# Patient Record
Sex: Female | Born: 1955
Health system: Southern US, Community
[De-identification: ages and names within clinical notes are randomized; demographics above are authoritative.]

## PROBLEM LIST (undated history)

## (undated) DIAGNOSIS — M1990 Unspecified osteoarthritis, unspecified site: Secondary | ICD-10-CM

## (undated) DIAGNOSIS — Z9221 Personal history of antineoplastic chemotherapy: Secondary | ICD-10-CM

## (undated) DIAGNOSIS — Z9889 Other specified postprocedural states: Secondary | ICD-10-CM

## (undated) DIAGNOSIS — Z853 Personal history of malignant neoplasm of breast: Secondary | ICD-10-CM

## (undated) DIAGNOSIS — D649 Anemia, unspecified: Secondary | ICD-10-CM

## (undated) DIAGNOSIS — F419 Anxiety disorder, unspecified: Secondary | ICD-10-CM

## (undated) DIAGNOSIS — M5020 Other cervical disc displacement, unspecified cervical region: Secondary | ICD-10-CM

## (undated) DIAGNOSIS — R002 Palpitations: Secondary | ICD-10-CM

## (undated) DIAGNOSIS — F329 Major depressive disorder, single episode, unspecified: Secondary | ICD-10-CM

## (undated) DIAGNOSIS — R112 Nausea with vomiting, unspecified: Secondary | ICD-10-CM

## (undated) DIAGNOSIS — C50919 Malignant neoplasm of unspecified site of unspecified female breast: Secondary | ICD-10-CM

## (undated) DIAGNOSIS — F32A Depression, unspecified: Secondary | ICD-10-CM

## (undated) DIAGNOSIS — K219 Gastro-esophageal reflux disease without esophagitis: Secondary | ICD-10-CM

## (undated) DIAGNOSIS — M797 Fibromyalgia: Secondary | ICD-10-CM

## (undated) DIAGNOSIS — Z98811 Dental restoration status: Secondary | ICD-10-CM

## (undated) DIAGNOSIS — I10 Essential (primary) hypertension: Secondary | ICD-10-CM

## (undated) DIAGNOSIS — M5030 Other cervical disc degeneration, unspecified cervical region: Secondary | ICD-10-CM

## (undated) DIAGNOSIS — Z800 Family history of malignant neoplasm of digestive organs: Secondary | ICD-10-CM

## (undated) DIAGNOSIS — E042 Nontoxic multinodular goiter: Secondary | ICD-10-CM

## (undated) DIAGNOSIS — Z803 Family history of malignant neoplasm of breast: Secondary | ICD-10-CM

## (undated) HISTORY — DX: Family history of malignant neoplasm of breast: Z80.3

## (undated) HISTORY — DX: Nontoxic multinodular goiter: E04.2

## (undated) HISTORY — DX: Essential (primary) hypertension: I10

## (undated) HISTORY — DX: Family history of malignant neoplasm of digestive organs: Z80.0

## (undated) HISTORY — DX: Fibromyalgia: M79.7

## (undated) HISTORY — DX: Anxiety disorder, unspecified: F41.9

## (undated) HISTORY — DX: Malignant neoplasm of unspecified site of unspecified female breast: C50.919

## (undated) HISTORY — DX: Palpitations: R00.2

## (undated) HISTORY — PX: BREAST BIOPSY: SHX20

---

## 2000-05-26 ENCOUNTER — Encounter: Admission: RE | Admit: 2000-05-26 | Discharge: 2000-08-24 | Payer: Self-pay | Admitting: Radiation Oncology

## 2000-11-15 ENCOUNTER — Encounter (HOSPITAL_COMMUNITY): Admission: RE | Admit: 2000-11-15 | Discharge: 2000-12-15 | Payer: Self-pay | Admitting: Oncology

## 2000-11-15 ENCOUNTER — Encounter: Admission: RE | Admit: 2000-11-15 | Discharge: 2000-11-15 | Payer: Self-pay | Admitting: Oncology

## 2000-11-17 ENCOUNTER — Encounter (HOSPITAL_COMMUNITY): Payer: Self-pay | Admitting: Oncology

## 2000-11-23 ENCOUNTER — Encounter (HOSPITAL_COMMUNITY): Payer: Self-pay | Admitting: Oncology

## 2000-11-23 ENCOUNTER — Encounter: Admission: RE | Admit: 2000-11-23 | Discharge: 2000-11-23 | Payer: Self-pay | Admitting: Oncology

## 2001-02-07 ENCOUNTER — Encounter (HOSPITAL_COMMUNITY): Admission: RE | Admit: 2001-02-07 | Discharge: 2001-03-09 | Payer: Self-pay | Admitting: Oncology

## 2001-02-07 ENCOUNTER — Encounter: Admission: RE | Admit: 2001-02-07 | Discharge: 2001-02-07 | Payer: Self-pay | Admitting: Oncology

## 2001-08-08 ENCOUNTER — Encounter (HOSPITAL_COMMUNITY): Admission: RE | Admit: 2001-08-08 | Discharge: 2001-09-07 | Payer: Self-pay | Admitting: Family Medicine

## 2001-08-08 ENCOUNTER — Encounter: Admission: RE | Admit: 2001-08-08 | Discharge: 2001-08-08 | Payer: Self-pay | Admitting: Family Medicine

## 2001-12-05 ENCOUNTER — Encounter (HOSPITAL_COMMUNITY): Payer: Self-pay | Admitting: Oncology

## 2001-12-05 ENCOUNTER — Encounter (HOSPITAL_COMMUNITY): Admission: RE | Admit: 2001-12-05 | Discharge: 2002-01-04 | Payer: Self-pay | Admitting: Oncology

## 2002-01-27 ENCOUNTER — Encounter: Payer: Self-pay | Admitting: Family Medicine

## 2002-01-27 ENCOUNTER — Ambulatory Visit (HOSPITAL_COMMUNITY): Admission: RE | Admit: 2002-01-27 | Discharge: 2002-01-27 | Payer: Self-pay | Admitting: Family Medicine

## 2002-03-06 ENCOUNTER — Encounter (HOSPITAL_COMMUNITY): Admission: RE | Admit: 2002-03-06 | Discharge: 2002-04-05 | Payer: Self-pay | Admitting: Oncology

## 2002-03-06 ENCOUNTER — Encounter: Admission: RE | Admit: 2002-03-06 | Discharge: 2002-03-06 | Payer: Self-pay | Admitting: Oncology

## 2002-05-01 ENCOUNTER — Encounter: Admission: RE | Admit: 2002-05-01 | Discharge: 2002-05-01 | Payer: Self-pay | Admitting: Oncology

## 2002-05-10 ENCOUNTER — Encounter (HOSPITAL_COMMUNITY): Payer: Self-pay | Admitting: Oncology

## 2002-05-10 ENCOUNTER — Encounter: Admission: RE | Admit: 2002-05-10 | Discharge: 2002-05-10 | Payer: Self-pay | Admitting: Oncology

## 2002-06-30 ENCOUNTER — Ambulatory Visit (HOSPITAL_COMMUNITY): Admission: RE | Admit: 2002-06-30 | Discharge: 2002-06-30 | Payer: Self-pay | Admitting: Internal Medicine

## 2002-06-30 ENCOUNTER — Encounter: Payer: Self-pay | Admitting: Internal Medicine

## 2002-09-04 ENCOUNTER — Encounter: Admission: RE | Admit: 2002-09-04 | Discharge: 2002-09-04 | Payer: Self-pay | Admitting: Oncology

## 2002-09-04 ENCOUNTER — Encounter (HOSPITAL_COMMUNITY): Admission: RE | Admit: 2002-09-04 | Discharge: 2002-10-04 | Payer: Self-pay | Admitting: Oncology

## 2002-12-11 ENCOUNTER — Encounter (HOSPITAL_COMMUNITY): Payer: Self-pay | Admitting: Oncology

## 2002-12-11 ENCOUNTER — Encounter: Admission: RE | Admit: 2002-12-11 | Discharge: 2002-12-11 | Payer: Self-pay | Admitting: Oncology

## 2003-03-07 ENCOUNTER — Encounter (HOSPITAL_COMMUNITY): Admission: RE | Admit: 2003-03-07 | Discharge: 2003-04-06 | Payer: Self-pay | Admitting: Oncology

## 2003-03-07 ENCOUNTER — Encounter: Admission: RE | Admit: 2003-03-07 | Discharge: 2003-03-07 | Payer: Self-pay | Admitting: Oncology

## 2003-08-31 ENCOUNTER — Encounter (HOSPITAL_COMMUNITY): Admission: RE | Admit: 2003-08-31 | Discharge: 2003-09-30 | Payer: Self-pay | Admitting: Oncology

## 2003-08-31 ENCOUNTER — Encounter: Admission: RE | Admit: 2003-08-31 | Discharge: 2003-08-31 | Payer: Self-pay | Admitting: Oncology

## 2004-01-22 ENCOUNTER — Encounter: Admission: RE | Admit: 2004-01-22 | Discharge: 2004-01-22 | Payer: Self-pay | Admitting: Oncology

## 2004-05-13 ENCOUNTER — Ambulatory Visit (HOSPITAL_COMMUNITY): Payer: Self-pay | Admitting: Oncology

## 2004-05-13 ENCOUNTER — Encounter: Admission: RE | Admit: 2004-05-13 | Discharge: 2004-05-13 | Payer: Self-pay | Admitting: Oncology

## 2004-05-13 ENCOUNTER — Encounter (HOSPITAL_COMMUNITY): Admission: RE | Admit: 2004-05-13 | Discharge: 2004-06-12 | Payer: Self-pay | Admitting: Oncology

## 2005-03-05 ENCOUNTER — Encounter: Admission: RE | Admit: 2005-03-05 | Discharge: 2005-03-05 | Payer: Self-pay | Admitting: Oncology

## 2005-05-15 ENCOUNTER — Ambulatory Visit (HOSPITAL_COMMUNITY): Payer: Self-pay | Admitting: Oncology

## 2005-05-15 ENCOUNTER — Encounter: Admission: RE | Admit: 2005-05-15 | Discharge: 2005-05-15 | Payer: Self-pay | Admitting: Oncology

## 2005-05-15 ENCOUNTER — Encounter (HOSPITAL_COMMUNITY): Admission: RE | Admit: 2005-05-15 | Discharge: 2005-06-14 | Payer: Self-pay | Admitting: Oncology

## 2005-07-14 ENCOUNTER — Ambulatory Visit (HOSPITAL_COMMUNITY): Admission: RE | Admit: 2005-07-14 | Discharge: 2005-07-14 | Payer: Self-pay | Admitting: Family Medicine

## 2006-03-12 ENCOUNTER — Encounter: Admission: RE | Admit: 2006-03-12 | Discharge: 2006-03-12 | Payer: Self-pay | Admitting: Family Medicine

## 2006-05-25 ENCOUNTER — Ambulatory Visit (HOSPITAL_COMMUNITY): Payer: Self-pay | Admitting: Oncology

## 2006-05-25 ENCOUNTER — Encounter (HOSPITAL_COMMUNITY): Admission: RE | Admit: 2006-05-25 | Discharge: 2006-06-24 | Payer: Self-pay | Admitting: Oncology

## 2007-05-04 ENCOUNTER — Encounter: Admission: RE | Admit: 2007-05-04 | Discharge: 2007-05-04 | Payer: Self-pay | Admitting: Obstetrics and Gynecology

## 2007-06-20 ENCOUNTER — Encounter (HOSPITAL_COMMUNITY): Admission: RE | Admit: 2007-06-20 | Discharge: 2007-07-20 | Payer: Self-pay | Admitting: Oncology

## 2007-06-20 ENCOUNTER — Ambulatory Visit (HOSPITAL_COMMUNITY): Payer: Self-pay | Admitting: Oncology

## 2007-07-29 ENCOUNTER — Ambulatory Visit (HOSPITAL_COMMUNITY): Admission: RE | Admit: 2007-07-29 | Discharge: 2007-07-29 | Payer: Self-pay | Admitting: Family Medicine

## 2007-08-08 ENCOUNTER — Encounter (HOSPITAL_COMMUNITY): Admission: RE | Admit: 2007-08-08 | Discharge: 2007-09-07 | Payer: Self-pay | Admitting: Family Medicine

## 2007-09-27 ENCOUNTER — Ambulatory Visit (HOSPITAL_COMMUNITY): Admission: RE | Admit: 2007-09-27 | Discharge: 2007-09-27 | Payer: Self-pay | Admitting: Family Medicine

## 2008-05-15 ENCOUNTER — Ambulatory Visit (HOSPITAL_COMMUNITY): Admission: RE | Admit: 2008-05-15 | Discharge: 2008-05-15 | Payer: Self-pay | Admitting: Endocrinology

## 2008-05-31 ENCOUNTER — Encounter: Admission: RE | Admit: 2008-05-31 | Discharge: 2008-05-31 | Payer: Self-pay | Admitting: Family Medicine

## 2008-06-18 ENCOUNTER — Ambulatory Visit (HOSPITAL_COMMUNITY): Payer: Self-pay | Admitting: Oncology

## 2008-11-07 ENCOUNTER — Ambulatory Visit (HOSPITAL_COMMUNITY): Admission: RE | Admit: 2008-11-07 | Discharge: 2008-11-07 | Payer: Self-pay | Admitting: Family Medicine

## 2008-12-14 ENCOUNTER — Ambulatory Visit (HOSPITAL_COMMUNITY): Admission: RE | Admit: 2008-12-14 | Discharge: 2008-12-14 | Payer: Self-pay | Admitting: Endocrinology

## 2008-12-19 ENCOUNTER — Ambulatory Visit (HOSPITAL_COMMUNITY): Admission: RE | Admit: 2008-12-19 | Discharge: 2008-12-19 | Payer: Self-pay | Admitting: Family Medicine

## 2009-06-11 ENCOUNTER — Encounter: Payer: Self-pay | Admitting: Orthopedic Surgery

## 2009-06-11 ENCOUNTER — Ambulatory Visit (HOSPITAL_COMMUNITY): Admission: RE | Admit: 2009-06-11 | Discharge: 2009-06-11 | Payer: Self-pay | Admitting: Family Medicine

## 2009-06-17 ENCOUNTER — Ambulatory Visit (HOSPITAL_COMMUNITY): Payer: Self-pay | Admitting: Oncology

## 2009-06-17 ENCOUNTER — Encounter (HOSPITAL_COMMUNITY): Admission: RE | Admit: 2009-06-17 | Discharge: 2009-07-17 | Payer: Self-pay | Admitting: Oncology

## 2009-06-19 ENCOUNTER — Ambulatory Visit (HOSPITAL_COMMUNITY): Admission: RE | Admit: 2009-06-19 | Discharge: 2009-06-19 | Payer: Self-pay | Admitting: Family Medicine

## 2009-06-21 ENCOUNTER — Ambulatory Visit (HOSPITAL_COMMUNITY): Admission: RE | Admit: 2009-06-21 | Discharge: 2009-06-21 | Payer: Self-pay | Admitting: Family Medicine

## 2009-06-24 ENCOUNTER — Encounter: Admission: RE | Admit: 2009-06-24 | Discharge: 2009-06-24 | Payer: Self-pay | Admitting: Family Medicine

## 2009-06-25 ENCOUNTER — Other Ambulatory Visit: Admission: RE | Admit: 2009-06-25 | Discharge: 2009-06-25 | Payer: Self-pay | Admitting: General Surgery

## 2009-07-03 ENCOUNTER — Ambulatory Visit: Payer: Self-pay | Admitting: Orthopedic Surgery

## 2009-07-03 DIAGNOSIS — M25819 Other specified joint disorders, unspecified shoulder: Secondary | ICD-10-CM | POA: Insufficient documentation

## 2009-07-03 DIAGNOSIS — M7580 Other shoulder lesions, unspecified shoulder: Secondary | ICD-10-CM

## 2009-07-25 ENCOUNTER — Ambulatory Visit: Payer: Self-pay | Admitting: Orthopedic Surgery

## 2009-07-31 ENCOUNTER — Telehealth: Payer: Self-pay | Admitting: Orthopedic Surgery

## 2009-08-02 ENCOUNTER — Ambulatory Visit (HOSPITAL_COMMUNITY): Admission: RE | Admit: 2009-08-02 | Discharge: 2009-08-02 | Payer: Self-pay | Admitting: Endocrinology

## 2009-08-03 ENCOUNTER — Encounter: Payer: Self-pay | Admitting: Orthopedic Surgery

## 2009-08-03 ENCOUNTER — Encounter: Admission: RE | Admit: 2009-08-03 | Discharge: 2009-08-03 | Payer: Self-pay | Admitting: Orthopedic Surgery

## 2009-08-08 ENCOUNTER — Ambulatory Visit: Payer: Self-pay | Admitting: Orthopedic Surgery

## 2009-08-12 ENCOUNTER — Encounter (HOSPITAL_COMMUNITY): Admission: RE | Admit: 2009-08-12 | Discharge: 2009-09-11 | Payer: Self-pay | Admitting: Orthopedic Surgery

## 2009-08-29 ENCOUNTER — Encounter: Payer: Self-pay | Admitting: Orthopedic Surgery

## 2010-02-04 ENCOUNTER — Ambulatory Visit (HOSPITAL_COMMUNITY): Admission: RE | Admit: 2010-02-04 | Discharge: 2010-02-04 | Payer: Self-pay | Admitting: Family Medicine

## 2010-05-25 ENCOUNTER — Encounter: Payer: Self-pay | Admitting: Family Medicine

## 2010-05-25 ENCOUNTER — Encounter (HOSPITAL_COMMUNITY): Payer: Self-pay | Admitting: Oncology

## 2010-05-26 ENCOUNTER — Encounter: Payer: Self-pay | Admitting: Family Medicine

## 2010-06-03 NOTE — Assessment & Plan Note (Signed)
Summary: LEFT SHOULDER PAIN/[facility]/BSF   Visit Type:  Follow-up Referring Provider:  Dr. Nobie Putnam. Primary Provider:  Dr. Nobie Putnam  CC:  left shoulder pain.  History of Present Illness: 55 year old female had an injection 3 weeks ago presents earlier than her 6 week appointment complaining of increased shoulder pain decreased range of motion and radiation of shoulder pain into the lower part of the upper arm.  No recent trauma.  History of cervical disc disease.  Exam shows that she indeed has increased tenderness around the shoulder some warmth some tenderness in the lateral and anterior deltoid, pain with range of motion and external rotation as well as abduction in both motions were limited with only 45 external rotation and about 40 of abduction  She is advised to take the nabumetone and the Ultram I also put her on a prednisone 5 mg 12 a Dosepak, I placed her in a sling and advised to get an MRI of the LEFT shoulder  Reason for delayed reaction to the injection not clear.    Allergies: No Known Drug Allergies   Impression & Recommendations:  Problem # 1:  IMPINGEMENT SYNDROME (ICD-726.2) Assessment Deteriorated  Orders: Est. Patient Level II ([address])  Medications Added to Medication List This Visit: 1)  Prednisone (pak) 5 Mg Tabs (Prednisone) .... As directed 2)  Ultracet 37.5-325 Mg Tabs (Tramadol-acetaminophen) .Marland Kitchen.. 1 by mouth q 4 as needed pain  Patient Instructions: 1)  MRI LEFT SHOULDER   2)  SLING 3)  TAKE PREDNISONE DOSE PACK to decresae pain  4)   2 week return  Prescriptions: ULTRACET 37.5-325 MG TABS (TRAMADOL-ACETAMINOPHEN) 1 by mouth q 4 as needed pain  #60 x 1   Entered and Authorized by:   Fuller Canada MD   Signed by:   Fuller Canada MD on 07/25/2009   Method used:   Print then Give to Patient   RxID:   [id number] PREDNISONE (PAK) 5 MG TABS (PREDNISONE) as directed  #1 x 1   Entered and Authorized by:   Fuller Canada MD   Signed by:    Fuller Canada MD on 07/25/2009   Method used:   Print then Give to Patient   RxID:   [id number]

## 2010-06-03 NOTE — Assessment & Plan Note (Signed)
Summary: LEFT SHOULDER PAIN XR AT [facility] /[facility]F   Vital Signs:  Patient profile:   55 year old female Weight:      178 pounds Pulse rate:   86 / minute Resp:     18 per minute  Vitals Entered By: Fuller Canada MD (July 03, 2009 9:52 AM)  Visit Type:  Initial Consult Referring Provider:  Dr. Nobie Putnam. Primary Provider:  Dr. Nobie Putnam  CC:  left shoulder pain.  History of Present Illness: 55 year old female presents with 2 years of LEFT shoulder pain which is worse when she tries to move her arm behind her back or overhead.  She complains of moderate 5/10 sharp constant pain with a catching sensation in the LEFT shoulder she denies any injury but does report a loss or decrease in range of motion  Xrays [facility] left shoulder and c-spine on 06/11/09. these x-rays have been read and reviewed there are no acute bony abnormalities there may be some calcific tendinitis in the rotator cuff on the LEFT shoulder the cervical spine showed slight displaced narrowing at cervical levels 3 and 4 but otherwise normal films  Meds: Xanax, Prilosec, Metoprolol.  Naprosyn and Vicodin made her sick.      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 Cardiovascular:  Denies chest pain, palpitations, fainting, and murmurs; fast heart beat and hard beats. Respiratory:  Denies short of breath, wheezing, couch, tightness, pain on inspiration, and snoring . Gastrointestinal:  Complains of heartburn; denies nausea, vomiting, diarrhea, constipation, and blood in your stools. Genitourinary:   Complains of bleeding in urine; denies frequency, urgency, difficulty urinating, painful urination, and flank pain. Neurologic:  Complains of dizziness; denies numbness, tingling, unsteady gait, tremors, and seizure. Musculoskeletal:  Complains of stiffness; denies joint pain, swelling, instability, redness, heat, and muscle pain. Endocrine:  Denies excessive thirst, exessive urination, and heat or cold intolerance. Psychiatric:  Complains of nervousness and anxiety; denies depression and hallucinations. Skin:  Denies changes in the skin, poor healing, rash, itching, and redness. HEENT:  Complains of blurred or double vision; denies eye pain, redness, and watering; headache and earaches. Immunology:  Complains of seasonal allergies; denies sinus problems and allergic to bee stings; adverse rxn to foods. Hemoatologic:  Complains of brusing.  Physical Exam  Additional Exam:   VS reviewed and were normal  GEN: appearance was normal   CDV: normal pulses temperature and no edema  LYMPH nodes were normal   SKIN was normal   Neuro: normal sensation Psyche: AAO x 3 and mood was normal   MSK *Gait was normal   The shoulders were evaluated together in terms of palpable tenderness there was mild discomfort on the LEFT anterolateral deltoid none on the RIGHT  In terms of range of motion the RIGHT was full and on the LEFT we saw decreased range of motion in internal rotation approximately 2 levels forward elevation of about 150 vs. 180 and external rotation of about 35 vs. 50  I detected no joint laxity  The rotator cuff strength was normal bilaterally grade 5  Impingement was positive on the LEFT at 150  cervical spine motion  was normal and there was no tenderness   Impression & Recommendations:  Problem # 1:  IMPINGEMENT SYNDROME (ICD-726.2) Assessment New  we injected the LEFT subacromial space Verbal consent obtained/The shoulder was injected with depomedrol 40mg /cc and  sensorcaine .25% . There were no complications  We would like to start an exercise program with Codman exercises to improve range of motion.  Patient education has also been initiated with the shoulder pain handout and impingement syndrome handout from the [employer]  Orders: New Patient Level III ([address]) Joint Aspirate / Injection, Large (20610) Depo- Medrol 40mg  (J1030)  Patient Instructions: 1)  You have received an injection of cortisone today. You may experience increased pain at the injection site. Apply ice pack to the area for 20 minutes every 2 hours and take 2 xtra strength tylenol every 8 hours. This increased pain will usually resolve in 24 hours. The injection will take effect in 3-10 days.  2)  Limit activity to comfort and avoid activities that increase discomfort.  Apply moist heat and/or ice to shoulder and take medication as instructed for pain relief. Please read the Shoulder Pain Handout and start Home exercise program  as directed. 3)  If you do not improve after 6 weeks reschedule an appointment

## 2010-06-03 NOTE — Assessment & Plan Note (Signed)
Summary: 2 WK RECK LEFT SHOULDER/MRI RESULTS/[facility]/BSF   Visit Type:  Follow-up Referring Provider:  Dr. Nobie Putnam. Primary Provider:  Dr. Nobie Putnam  CC:  left shoulder pain.  History of Present Illness: I saw [name] in the office today for a followup visit.  She is a 55 years old woman with the complaint of:  left shoulder.  Medications:  Prednisone (pak) 5 Mg Tabs (Prednisone) She finished this medicine. Ultracet 37.5-325 Mg Tabs (Tramadol-acetaminophen) .Marland Kitchen.. 1 by mouth q 4 as needed pain   MRI results:  IMPRESSION:   1.  5 mm x 13 mm x 8 mm calcified object in the subacromial/subdeltoid bursa.  Minimal fluid is present in the bursa nearby.  The appearance is most consistent with calcific bursitis or small loose body. 2.  Rotator cuff tendinopathy without tear.   Read By:  Wynn Banker      MRI as stated above  I do recommend she did some exercises done at home after her therapy visit to strengthen the shoulder he basically has tendinitis no tear.  I think therapy for 6-8 weeks at home is in order she doesn't improve we can always readdress the situation  Allergies: No Known Drug Allergies  Review of Systems Musculoskeletal:  no catching or locking at this time in the LEFT shoulder.   Other Orders: Physical Therapy Referral (PT) Est. Patient Level III ([address])  Patient Instructions: 1)  PT, one session to get exercises for home exercises RC Syndrome left shoulder 2)  come back as needed

## 2010-06-03 NOTE — Letter (Signed)
Summary: Out of Work  [facility]  [address]  [place], [place] [address]   Phone: [phone]  Fax: [phone]    August 08, 2009   Employee:  SOPHI CALLIGAN    To Whom It May Concern:   For Medical reasons, please excuse the above named employee from work for the following dates:  Start:   07/25/09  End:   08/08/09  Patient is cleared to return to full duty work, no restrictions today, following appointment on 08/08/09.   If you need additional information, please feel free to contact our office.         Sincerely,    Terrance Mass, MD

## 2010-06-03 NOTE — Letter (Signed)
Summary: Out of Work  [facility]  [address]  [place], [place] [address]   Phone: [phone]  Fax: [phone]    July 25, 2009   Employee:  [name]    To Whom It May Concern:   For Medical reasons, please excuse the above named employee from work for the following dates:  Start:   3/ 24/ 2011  End:   4/ 7/ 2011   If you need additional information, please feel free to contact our office.         Sincerely,    Fuller Canada MD

## 2010-06-03 NOTE — Progress Notes (Signed)
Summary: MRI appointment.  Phone Note Outgoing Call   Call placed by: Waldon Reining,  July 31, 2009 5:03 PM Call placed to: Patient Action Taken: Phone Call Completed, Appt scheduled Summary of Call: I called to give the patient her MRI appointment at [facility] on 08-03-09 at 3:30. Patient has [facility], authorization [phone]. Patient is aware to bring her films for her follow up appointment.

## 2010-06-03 NOTE — Miscellaneous (Signed)
Summary: OT Progress note  OT Progress note   Imported By: Jacklynn Ganong 09/09/2009 09:58:38  _____________________________________________________________________  External Attachment:    Type:   Image     Comment:   External Document

## 2010-06-03 NOTE — Letter (Signed)
Summary: History form  History form   Imported By: Jacklynn Ganong 07/08/2009 10:36:52  _____________________________________________________________________  External Attachment:    Type:   Image     Comment:   External Document

## 2010-06-06 ENCOUNTER — Other Ambulatory Visit: Payer: Self-pay | Admitting: Obstetrics and Gynecology

## 2010-06-06 DIAGNOSIS — Z1239 Encounter for other screening for malignant neoplasm of breast: Secondary | ICD-10-CM

## 2010-06-17 ENCOUNTER — Ambulatory Visit (HOSPITAL_COMMUNITY): Payer: 59 | Admitting: Oncology

## 2010-06-17 ENCOUNTER — Encounter (HOSPITAL_COMMUNITY): Payer: 59 | Attending: Oncology

## 2010-06-17 DIAGNOSIS — C50919 Malignant neoplasm of unspecified site of unspecified female breast: Secondary | ICD-10-CM

## 2010-06-17 DIAGNOSIS — Z79899 Other long term (current) drug therapy: Secondary | ICD-10-CM | POA: Insufficient documentation

## 2010-06-17 DIAGNOSIS — R42 Dizziness and giddiness: Secondary | ICD-10-CM | POA: Insufficient documentation

## 2010-06-17 DIAGNOSIS — G2581 Restless legs syndrome: Secondary | ICD-10-CM | POA: Insufficient documentation

## 2010-06-17 DIAGNOSIS — Z853 Personal history of malignant neoplasm of breast: Secondary | ICD-10-CM | POA: Insufficient documentation

## 2010-07-22 ENCOUNTER — Ambulatory Visit
Admission: RE | Admit: 2010-07-22 | Discharge: 2010-07-22 | Disposition: A | Discharge status: Home / Self Care | Payer: 59 | Source: Ambulatory Visit | Attending: Obstetrics and Gynecology | Admitting: Obstetrics and Gynecology

## 2010-07-22 DIAGNOSIS — Z1239 Encounter for other screening for malignant neoplasm of breast: Secondary | ICD-10-CM

## 2010-07-23 LAB — CBC
HCT: 45.3 % (ref 36.0–46.0)
MCV: 92.3 fL (ref 78.0–100.0)
Platelets: 258 10*3/uL (ref 150–400)
RDW: 12.7 % (ref 11.5–15.5)
WBC: 5.4 10*3/uL (ref 4.0–10.5)

## 2010-07-23 LAB — DIFFERENTIAL
Basophils Absolute: 0 10*3/uL (ref 0.0–0.1)
Basophils Relative: 1 % (ref 0–1)
Eosinophils Absolute: 0.2 10*3/uL (ref 0.0–0.7)
Eosinophils Relative: 5 % (ref 0–5)
Neutrophils Relative %: 42 % — ABNORMAL LOW (ref 43–77)

## 2010-07-23 LAB — COMPREHENSIVE METABOLIC PANEL
ALT: 22 U/L (ref 0–35)
AST: 25 U/L (ref 0–37)
BUN: 13 mg/dL (ref 6–23)
CO2: 29 mEq/L (ref 19–32)
Creatinine, Ser: 0.7 mg/dL (ref 0.4–1.2)
GFR calc non Af Amer: >60 mL/min (ref >60)
Sodium: 139 mEq/L (ref 135–145)
Total Bilirubin: 0.7 mg/dL (ref 0.3–1.2)
Total Protein: 8 g/dL (ref 6.0–8.3)

## 2010-12-22 ENCOUNTER — Encounter: Payer: 59 | Admitting: Vascular Surgery

## 2011-01-23 LAB — DIFFERENTIAL
Basophils Relative: 0
Lymphocytes Relative: 43
Lymphs Abs: 2.6
Monocytes Absolute: 0.4
Monocytes Relative: 6
Neutro Abs: 2.7
Neutrophils Relative %: 45

## 2011-01-23 LAB — COMPREHENSIVE METABOLIC PANEL
Albumin: 4
BUN: 10
Calcium: 9.3
Creatinine, Ser: 0.66
Glucose, Bld: 87
Total Protein: 7.3

## 2011-01-23 LAB — CBC
HCT: 41.8
Hemoglobin: 14.7
MCHC: 35.1
Platelets: 268
RDW: 12.7

## 2011-01-23 LAB — CANCER ANTIGEN 27.29: CA 27.29: 34

## 2011-03-15 ENCOUNTER — Emergency Department (HOSPITAL_COMMUNITY)
Admission: EM | Admit: 2011-03-15 | Discharge: 2011-03-15 | Disposition: A | Discharge status: Home / Self Care | Payer: 59 | Attending: Emergency Medicine | Admitting: Emergency Medicine

## 2011-03-15 DIAGNOSIS — Z87440 Personal history of urinary (tract) infections: Secondary | ICD-10-CM | POA: Insufficient documentation

## 2011-03-15 DIAGNOSIS — R300 Dysuria: Secondary | ICD-10-CM | POA: Insufficient documentation

## 2011-03-15 DIAGNOSIS — R319 Hematuria, unspecified: Secondary | ICD-10-CM | POA: Insufficient documentation

## 2011-03-15 DIAGNOSIS — N390 Urinary tract infection, site not specified: Secondary | ICD-10-CM

## 2011-03-15 DIAGNOSIS — R42 Dizziness and giddiness: Secondary | ICD-10-CM | POA: Insufficient documentation

## 2011-03-15 DIAGNOSIS — R109 Unspecified abdominal pain: Secondary | ICD-10-CM | POA: Insufficient documentation

## 2011-03-15 DIAGNOSIS — R350 Frequency of micturition: Secondary | ICD-10-CM | POA: Insufficient documentation

## 2011-03-15 LAB — URINALYSIS, ROUTINE W REFLEX MICROSCOPIC
Bilirubin Urine: NEGATIVE
Glucose, UA: NEGATIVE mg/dL
Specific Gravity, Urine: >1.03 — ABNORMAL HIGH (ref 1.005–1.030)
Urobilinogen, UA: 0.2 mg/dL (ref 0.0–1.0)
pH: 5.5 (ref 5.0–8.0)

## 2011-03-15 LAB — URINE MICROSCOPIC-ADD ON

## 2011-03-15 MED ORDER — CEPHALEXIN 500 MG PO CAPS: 500.0000 mg | ORAL_CAPSULE | Freq: Four times a day (QID) | ORAL | Status: AC

## 2011-03-15 NOTE — ED Provider Notes (Signed)
History   This chart was scribed for Joya Gaskins, MD by Clarita Crane. The patient was seen in room APA05/[facility]05 and the patient's care was started at 3:57PM.   CSN: [id number] Arrival date & time: 03/15/2011  3:41 PM   First MD Initiated Contact with Patient 03/15/11 1544      Chief Complaint  Patient presents with  . Tachycardia  . Hematuria  . Dysuria  . Dizziness   HPI [name] is a 55 y.o. female who presents to the Emergency Department complaining of constant dysuria with associated hematuria, urinary frequency and dizziness onset today and persistent since. Patient also notes she experienced a brief episode of abdominal pain yesterday which resolved on its own and that she has measured her heart rate multiple times today which has ranged from 100 BPM to 106 BPM several times. Denies LOC, syncope, chest pain, back pain, nausea, vomiting. Patient notes current symptoms are similar to those previously experienced with multiple UTIs in the past. Patient notes having a significant history of UTIs with last experienced approximately 6 months ago.   Past Medical History  Diagnosis Date  . Tachycardia   . Cancer   . Renal disorder     Past Surgical History  Procedure Date  . Breast lumpectomy     History reviewed. No pertinent family history.  History  Substance Use Topics  . Smoking status: Never Smoker   . Smokeless tobacco: Not on file  . Alcohol Use: No    OB History    Grav Para Term Preterm Abortions TAB SAB Ect Mult Living                  Review of Systems 10 Systems reviewed and are negative for acute change except as noted in the HPI.  Allergies  Codeine  Home Medications  No current outpatient prescriptions on file.  BP 154/70  Pulse 109  Temp(Src) 98.5 F (36.9 C) (Oral)  Resp 20  Ht 5\' 6"  (1.676 m)  Wt 176 lb (79.833 kg)  BMI 28.41 kg/m2  SpO2 100%  Physical Exam CONSTITUTIONAL: Well developed/well nourished HEAD AND FACE:  Normocephalic/atraumatic EYES: EOMI/PERRL ENMT: Mucous membranes moist NECK: supple no meningeal signs CV: S1/S2 noted, no murmurs/rubs/gallops noted LUNGS: Lungs are clear to auscultation bilaterally, no apparent distress ABDOMEN: soft, nontender, no rebound or guarding, bowel sounds normal GU:no cva tenderness NEURO: Pt is awake/alert, moves all extremitiesx4 EXTREMITIES: pulses normal, full ROM, DP and PT pulses intact distally SKIN: warm, color normal PSYCH: no abnormalities of mood noted  ED Course  Procedures  DIAGNOSTIC STUDIES: Oxygen Saturation is 100% on room air, normal by my interpretation.    COORDINATION OF CARE: 4:20PM- Patient informed of current lab results and intent to d/c home with prescription for Keflex to treat probable UTI. Patient agrees with plan set forth at this time.  Pt did not want EKG as I offered her one for her palpitations  Labs Reviewed  URINALYSIS, ROUTINE W REFLEX MICROSCOPIC - Abnormal; Notable for the following:    Appearance CLOUDY (*)    Specific Gravity, Urine >1.030 (*)    Hgb urine dipstick LARGE (*)    Ketones, ur TRACE (*)    Protein, ur 100 (*)    Nitrite POSITIVE (*)    Leukocytes, UA MODERATE (*)    All other components within normal limits  URINE MICROSCOPIC-ADD ON - Abnormal; Notable for the following:    Squamous Epithelial / LPF FEW (*)  Bacteria, UA MANY (*)    All other components within normal limits      MDM  Nursing notes reviewed and considered in documentation All labs/vitals reviewed and considered       I personally performed the services described in this documentation, which was scribed in my presence. The recorded information has been reviewed and considered.      Joya Gaskins, MD 03/15/11 [phone]

## 2011-03-15 NOTE — ED Notes (Signed)
Pt presents with hematuria, burning during urination, dizziness, and tachycardia. Pt states symptoms started today. Pt denies n/v/d.

## 2011-05-06 ENCOUNTER — Other Ambulatory Visit: Payer: Self-pay | Admitting: Family Medicine

## 2011-05-06 DIAGNOSIS — Z1231 Encounter for screening mammogram for malignant neoplasm of breast: Secondary | ICD-10-CM

## 2011-08-07 ENCOUNTER — Ambulatory Visit: Payer: 59

## 2011-08-21 ENCOUNTER — Ambulatory Visit
Admission: RE | Admit: 2011-08-21 | Discharge: 2011-08-21 | Disposition: A | Discharge status: Home / Self Care | Payer: 59 | Source: Ambulatory Visit | Attending: Family Medicine | Admitting: Family Medicine

## 2011-08-21 ENCOUNTER — Other Ambulatory Visit: Payer: Self-pay | Admitting: Obstetrics and Gynecology

## 2011-08-21 ENCOUNTER — Ambulatory Visit: Payer: 59

## 2011-08-21 DIAGNOSIS — Z1231 Encounter for screening mammogram for malignant neoplasm of breast: Secondary | ICD-10-CM

## 2012-06-10 ENCOUNTER — Other Ambulatory Visit: Payer: Self-pay | Admitting: Family Medicine

## 2012-06-10 DIAGNOSIS — Z1231 Encounter for screening mammogram for malignant neoplasm of breast: Secondary | ICD-10-CM

## 2012-09-23 ENCOUNTER — Ambulatory Visit
Admission: RE | Admit: 2012-09-23 | Discharge: 2012-09-23 | Disposition: A | Discharge status: Home / Self Care | Payer: BC Managed Care – PPO | Source: Ambulatory Visit | Attending: Family Medicine | Admitting: Family Medicine

## 2012-09-23 ENCOUNTER — Other Ambulatory Visit: Payer: Self-pay | Admitting: Obstetrics and Gynecology

## 2012-09-23 DIAGNOSIS — Z1231 Encounter for screening mammogram for malignant neoplasm of breast: Secondary | ICD-10-CM

## 2012-11-09 ENCOUNTER — Encounter (HOSPITAL_COMMUNITY): Payer: Self-pay

## 2012-11-09 ENCOUNTER — Observation Stay (HOSPITAL_COMMUNITY)
Admission: EM | Admit: 2012-11-09 | Discharge: 2012-11-10 | Disposition: A | Discharge status: Home / Self Care | Payer: BC Managed Care – PPO | Attending: General Surgery | Admitting: General Surgery

## 2012-11-09 DIAGNOSIS — Z79899 Other long term (current) drug therapy: Secondary | ICD-10-CM | POA: Insufficient documentation

## 2012-11-09 DIAGNOSIS — R1033 Periumbilical pain: Secondary | ICD-10-CM | POA: Insufficient documentation

## 2012-11-09 DIAGNOSIS — K3580 Unspecified acute appendicitis: Secondary | ICD-10-CM

## 2012-11-09 DIAGNOSIS — R1031 Right lower quadrant pain: Secondary | ICD-10-CM | POA: Insufficient documentation

## 2012-11-09 HISTORY — DX: Nausea with vomiting, unspecified: R11.2

## 2012-11-09 HISTORY — DX: Other specified postprocedural states: Z98.890

## 2012-11-09 LAB — URINALYSIS, ROUTINE W REFLEX MICROSCOPIC
Bilirubin Urine: NEGATIVE
Ketones, ur: NEGATIVE mg/dL
Nitrite: NEGATIVE
Protein, ur: NEGATIVE mg/dL
pH: 7.5 (ref 5.0–8.0)

## 2012-11-09 LAB — BASIC METABOLIC PANEL
BUN: 10 mg/dL (ref 6–23)
Calcium: 10.1 mg/dL (ref 8.4–10.5)
Creatinine, Ser: 0.64 mg/dL (ref 0.50–1.10)
GFR calc Af Amer: >90 mL/min (ref >90)
GFR calc non Af Amer: >90 mL/min (ref >90)

## 2012-11-09 LAB — CBC WITH DIFFERENTIAL/PLATELET
Basophils Relative: 1 % (ref 0–1)
Eosinophils Absolute: 0.3 10*3/uL (ref 0.0–0.7)
HCT: 47 % — ABNORMAL HIGH (ref 36.0–46.0)
Hemoglobin: 16.1 g/dL — ABNORMAL HIGH (ref 12.0–15.0)
MCH: 32.1 pg (ref 26.0–34.0)
MCHC: 34.3 g/dL (ref 30.0–36.0)
Monocytes Absolute: 0.6 10*3/uL (ref 0.1–1.0)
Monocytes Relative: 9 % (ref 3–12)
Neutrophils Relative %: 52 % (ref 43–77)
RDW: 12.3 % (ref 11.5–15.5)

## 2012-11-09 MED ORDER — ONDANSETRON HCL 4 MG/2ML IJ SOLN
4.0000 mg | Freq: Once | INTRAMUSCULAR | Status: AC
  Administered 2012-11-10: 4 mg via INTRAVENOUS
  Filled 2012-11-09: qty 2

## 2012-11-09 MED ORDER — SODIUM CHLORIDE 0.9 % IV SOLN
1000.0000 mL | INTRAVENOUS | Status: DC
  Administered 2012-11-10: 1000 mL via INTRAVENOUS

## 2012-11-09 MED ORDER — MORPHINE SULFATE 4 MG/ML IJ SOLN
4.0000 mg | Freq: Once | INTRAMUSCULAR | Status: AC
  Administered 2012-11-10: 2 mg via INTRAVENOUS
  Filled 2012-11-09: qty 1

## 2012-11-09 MED ORDER — SODIUM CHLORIDE 0.9 % IV SOLN
1000.0000 mL | Freq: Once | INTRAVENOUS | Status: AC
  Administered 2012-11-10: 1000 mL via INTRAVENOUS

## 2012-11-09 NOTE — ED Notes (Signed)
Pt c/o pain in mid and lower abd on r side since 10pm last night.  C/O nausea but no vomiting.  Has had several BMs today but says isn't diarrhea.  Reports pain is worse with eating.

## 2012-11-09 NOTE — ED Provider Notes (Signed)
History  This chart was scribed for Ward Givens, MD by Ardelia Mems, ED Scribe. This patient was seen in room APA09/AP[facility]09 and the patient's care was started at 11:05 PM.  CSN: [id number]  Arrival date & time 11/09/12  1758   Chief Complaint  Patient presents with  . Abdominal Pain    The history is provided by the patient. No language interpreter was used.   HPI Comments: [name] is a 57 y.o. female who presents to the Emergency Department complaining of intermittent, moderate generalized abdominal pain that started last night. Her pain is worse on the right than the left. She reports associated nausea and urinary frequency.She denies dysuria, fever, hard stools.  Pt states that her abdominal pain is worsened after meals. Pt states that she has had 4 bowel movements today, but states that they were not diarrhea. Pt states that Advil made her pain slightly better yesterday. Pt reports hx of similar pain in the RUQ that comes and goes for a few months. Pt states that she has an abdominal surgical hx of only one exploratory abdominal surgery about 25 years that was normal. Pt denies diarrhea, vomiting, constipation, fever, dysuria. Pt denies alcohol use and denies smoking.  PCP- Dr. Phillips Odor    Past Medical History  Diagnosis Date  . Tachycardia   . Cancer   . Renal disorder    Past Surgical History  Procedure Laterality Date  . Breast lumpectomy     No family history on file. History  Substance Use Topics  . Smoking status: Never Smoker   . Smokeless tobacco: Not on file  . Alcohol Use: No   Unemployed Lives with spouse   OB History   Grav Para Term Preterm Abortions TAB SAB Ect Mult Living                 Review of Systems  Constitutional: Negative for fever and chills.  HENT: Negative for congestion, sore throat, rhinorrhea and neck pain.   Eyes: Negative for visual disturbance.  Respiratory: Negative for cough and shortness of breath.   Cardiovascular:  Negative for chest pain.  Gastrointestinal: Positive for nausea and abdominal pain. Negative for vomiting, diarrhea and constipation.  Genitourinary: Positive for frequency. Negative for dysuria.  Musculoskeletal: Negative for back pain.  Neurological: Negative for headaches.  Psychiatric/Behavioral: Negative for confusion.  All other systems reviewed and are negative.  A complete 10 system review of systems was obtained and all systems are negative except as noted in the HPI and PMH.   Allergies  Codeine and Latex  Home Medications   Current Outpatient Rx  Name  Route  Sig  Dispense  Refill  . ALPRAZolam (XANAX) 0.5 MG tablet   Oral   Take 0.25 mg by mouth at bedtime.         Marland Kitchen omeprazole (PRILOSEC) 20 MG capsule   Oral   Take 20 mg by mouth daily.         Marland Kitchen PRESCRIPTION MEDICATION   Oral   Take 12.5 mg by mouth daily. Taking 25 mg of Metoprolol but isn't sure if the tablet is ER or not.          Triage Vitals: BP 142/72  Pulse 88  Temp(Src) 99.1 F (37.3 C) (Oral)  Resp 18  Ht 5\' 6"  (1.676 m)  Wt 170 lb (77.111 kg)  BMI 27.45 kg/m2  SpO2 99%  Vital signs normal    Physical Exam  Nursing note and vitals  reviewed. Constitutional: She is oriented to person, place, and time. She appears well-developed and well-nourished.  Non-toxic appearance. She does not appear ill. No distress.  HENT:  Head: Normocephalic and atraumatic.  Right Ear: External ear normal.  Left Ear: External ear normal.  Nose: Nose normal. No mucosal edema or rhinorrhea.  Mouth/Throat: Oropharynx is clear and moist and mucous membranes are normal. No dental abscesses or edematous. No oropharyngeal exudate.  Eyes: Conjunctivae and EOM are normal. Pupils are equal, round, and reactive to light. Right eye exhibits no discharge. Left eye exhibits no discharge. No scleral icterus.  Neck: Normal range of motion and full passive range of motion without pain. Neck supple.  Cardiovascular: Normal  rate, regular rhythm and normal heart sounds.  Exam reveals no gallop and no friction rub.   No murmur heard. Pulmonary/Chest: Effort normal and breath sounds normal. No respiratory distress. She has no wheezes. She has no rhonchi. She has no rales. She exhibits no tenderness and no crepitus.  Abdominal: Soft. Normal appearance and bowel sounds are normal. She exhibits no distension. There is tenderness. There is no rebound and no guarding.  Most tender in RUQ, mild tenderness in epigastric area and RLQ. Has some pain in the RLQ when RUQ palpated  Musculoskeletal: Normal range of motion. She exhibits no edema and no tenderness.  Moves all extremities well.   Neurological: She is alert and oriented to person, place, and time. She has normal strength. No cranial nerve deficit.  Skin: Skin is warm, dry and intact. No rash noted. No erythema. No pallor.  Psychiatric: She has a normal mood and affect. Her speech is normal and behavior is normal. Judgment normal. Her mood appears not anxious.    ED Course  Procedures (including critical care time)  DIAGNOSTIC STUDIES: Oxygen Saturation is 99% on RA, normal by my interpretation.    COORDINATION OF CARE: 11:40 PM- Pt advised of plan for treatment and pt agrees.  Medications  0.9 %  sodium chloride infusion (1,000 mLs Intravenous New Bag/Given 11/10/12 0041)    Followed by  0.9 %  sodium chloride infusion (not administered)  cefoTEtan (CEFOTAN) 1 g in dextrose 5 % 50 mL IVPB (1 g Intravenous Given 11/10/12 0330)  morphine 4 MG/ML injection 4 mg (2 mg Intravenous Given 11/10/12 0041)  ondansetron (ZOFRAN) injection 4 mg (4 mg Intravenous Given 11/10/12 0041)  iohexol (OMNIPAQUE) 300 MG/ML solution 50 mL (50 mLs Oral Contrast Given 11/10/12 0016)  iohexol (OMNIPAQUE) 300 MG/ML solution 100 mL (100 mLs Intravenous Contrast Given 11/10/12 0201)   02:40 Dr Lovell Sheehan spoke initially, needed to call back after talking to patient.  02:51 Dr Lovell Sheehan, admit,  NPO, cefotan 2 grams IV, probably will operate around 11 am   Results for orders placed during the hospital encounter of 11/09/12  URINALYSIS, ROUTINE W REFLEX MICROSCOPIC      Result Value Range   Color, Urine YELLOW  YELLOW   APPearance CLEAR  CLEAR   Specific Gravity, Urine 1.010  1.005 - 1.030   pH 7.5  5.0 - 8.0   Glucose, UA NEGATIVE  NEGATIVE mg/dL   Hgb urine dipstick SMALL (*) NEGATIVE   Bilirubin Urine NEGATIVE  NEGATIVE   Ketones, ur NEGATIVE  NEGATIVE mg/dL   Protein, ur NEGATIVE  NEGATIVE mg/dL   Urobilinogen, UA 0.2  0.0 - 1.0 mg/dL   Nitrite NEGATIVE  NEGATIVE   Leukocytes, UA SMALL (*) NEGATIVE  CBC WITH DIFFERENTIAL      Result Value  Range   WBC 6.6  4.0 - 10.5 K/uL   RBC 5.02  3.87 - 5.11 MIL/uL   Hemoglobin 16.1 (*) 12.0 - 15.0 g/dL   HCT 14.7 (*) 82.9 - 56.2 %   MCV 93.6  78.0 - 100.0 fL   MCH 32.1  26.0 - 34.0 pg   MCHC 34.3  30.0 - 36.0 g/dL   RDW 13.0  86.5 - 78.4 %   Platelets 230  150 - 400 K/uL   Neutrophils Relative % 52  43 - 77 %   Neutro Abs 3.4  1.7 - 7.7 K/uL   Lymphocytes Relative 35  12 - 46 %   Lymphs Abs 2.3  0.7 - 4.0 K/uL   Monocytes Relative 9  3 - 12 %   Monocytes Absolute 0.6  0.1 - 1.0 K/uL   Eosinophils Relative 4  0 - 5 %   Eosinophils Absolute 0.3  0.0 - 0.7 K/uL   Basophils Relative 1  0 - 1 %   Basophils Absolute 0.0  0.0 - 0.1 K/uL  BASIC METABOLIC PANEL      Result Value Range   Sodium 141  135 - 145 mEq/L   Potassium 4.3  3.5 - 5.1 mEq/L   Chloride 103  96 - 112 mEq/L   CO2 28  19 - 32 mEq/L   Glucose, Bld 92  70 - 99 mg/dL   BUN 10  6 - 23 mg/dL   Creatinine, Ser 6.96  0.50 - 1.10 mg/dL   Calcium 29.5  8.4 - 28.4 mg/dL   GFR calc non Af Amer >90  >90 mL/min   GFR calc Af Amer >90  >90 mL/min  URINE MICROSCOPIC-ADD ON      Result Value Range   Squamous Epithelial / LPF RARE  RARE   WBC, UA 0-2  <3 WBC/hpf   RBC / HPF 0-2  <3 RBC/hpf   Bacteria, UA RARE  RARE  HEPATIC FUNCTION PANEL      Result Value Range    Total Protein 8.4 (*) 6.0 - 8.3 g/dL   Albumin 4.5  3.5 - 5.2 g/dL   AST 34  0 - 37 U/L   ALT 26  0 - 35 U/L   Alkaline Phosphatase 78  39 - 117 U/L   Total Bilirubin 0.3  0.3 - 1.2 mg/dL   Bilirubin, Direct <1.3  0.0 - 0.3 mg/dL   Indirect Bilirubin NOT CALCULATED  0.3 - 0.9 mg/dL  LIPASE, BLOOD      Result Value Range   Lipase 26  11 - 59 U/L   Laboratory interpretation all normal except concentrated Hb c/w dehydration   Ct Abdomen Pelvis W Contrast  11/10/2012   *RADIOLOGY REPORT*  Clinical Data: Right upper quadrant abdominal pain.  Query cholecystitis.  Pain for 4 hours.  Nausea.  CT ABDOMEN AND PELVIS WITH CONTRAST  Technique:  Multidetector CT imaging of the abdomen and pelvis was performed following the standard protocol during bolus administration of intravenous contrast.  Contrast: 50mL OMNIPAQUE IOHEXOL 300 MG/ML  SOLN, OMNIPAQUE IOHEXOL 300 MG/ML  SOLN  Comparison: 12/19/2008  Findings: Mild dependent changes in the lung bases.  The liver, spleen, gallbladder, pancreas, adrenal glands, kidneys, abdominal aorta, and retroperitoneal lymph nodes are unremarkable. Small esophageal hiatal hernia.  The stomach and small bowel are not abnormally distended and no wall thickening is appreciated. Contrast material flows to the colon without evidence of obstruction.  Prominent visceral adipose tissues.  Abdominal  wall musculature appears intact.  No free air or free fluid in the abdomen.  Pelvis:  The appendiceal diameter is borderline enlarged at about 9 mm.  There is stranding around the distal appendix.  Changes suggest early acute tip appendicitis.  No evidence of abscess. This is along the appendix with retrocecal extension.  The uterus and ovaries are not enlarged.  Bladder wall is not thickened.  No free or loculated pelvic fluid collections.  No significant pelvic lymphadenopathy.  Rectosigmoid colon is decompressed without evidence of diverticulitis.  Normal alignment of the lumbar  vertebrae.  IMPRESSION: Mild inflammatory changes demonstrated around the appendix with borderline appendiceal increased diameter suggesting early acute tip appendicitis.   Original Report Authenticated By: Burman Nieves, M.D.       1. Acute appendicitis     Plan admission   Devoria Albe, MD, FACEP    MDM    I personally performed the services described in this documentation, which was scribed in my presence. The recorded information has been reviewed and considered.  Devoria Albe, MD, FACEP    Ward Givens, MD 11/10/12 [phone]

## 2012-11-10 ENCOUNTER — Other Ambulatory Visit: Payer: Self-pay

## 2012-11-10 ENCOUNTER — Encounter (HOSPITAL_COMMUNITY): Payer: Self-pay | Admitting: Anesthesiology

## 2012-11-10 ENCOUNTER — Emergency Department (HOSPITAL_COMMUNITY): Payer: BC Managed Care – PPO

## 2012-11-10 ENCOUNTER — Observation Stay (HOSPITAL_COMMUNITY): Payer: BC Managed Care – PPO | Admitting: Anesthesiology

## 2012-11-10 ENCOUNTER — Encounter (HOSPITAL_COMMUNITY)
Admission: EM | Disposition: A | Discharge status: Home / Self Care | Payer: Self-pay | Source: Home / Self Care | Attending: General Surgery

## 2012-11-10 ENCOUNTER — Observation Stay (HOSPITAL_COMMUNITY): Payer: BC Managed Care – PPO

## 2012-11-10 ENCOUNTER — Encounter (HOSPITAL_COMMUNITY): Payer: Self-pay

## 2012-11-10 HISTORY — PX: LAPAROSCOPIC APPENDECTOMY: SHX408

## 2012-11-10 LAB — CBC
Hemoglobin: 14.9 g/dL (ref 12.0–15.0)
MCHC: 34.1 g/dL (ref 30.0–36.0)
Platelets: 280 10*3/uL (ref 150–400)
RDW: 12.3 % (ref 11.5–15.5)

## 2012-11-10 LAB — HEPATIC FUNCTION PANEL
ALT: 26 U/L (ref 0–35)
AST: 34 U/L (ref 0–37)
Alkaline Phosphatase: 78 U/L (ref 39–117)
Bilirubin, Direct: <0.1 mg/dL (ref 0.0–0.3)
Total Bilirubin: 0.3 mg/dL (ref 0.3–1.2)

## 2012-11-10 LAB — CREATININE, SERUM
Creatinine, Ser: 0.68 mg/dL (ref 0.50–1.10)
GFR calc non Af Amer: >90 mL/min (ref >90)

## 2012-11-10 SURGERY — APPENDECTOMY, LAPAROSCOPIC
Anesthesia: General | Site: Abdomen | Wound class: Contaminated

## 2012-11-10 MED ORDER — METOPROLOL SUCCINATE ER 25 MG PO TB24
12.5000 mg | ORAL_TABLET | Freq: Every day | ORAL | Status: DC
  Administered 2012-11-10: 12.5 mg via ORAL
  Filled 2012-11-10: qty 1

## 2012-11-10 MED ORDER — ONDANSETRON HCL 4 MG/2ML IJ SOLN
4.0000 mg | Freq: Once | INTRAMUSCULAR | Status: AC
  Administered 2012-11-10: 4 mg via INTRAVENOUS

## 2012-11-10 MED ORDER — GLYCOPYRROLATE 0.2 MG/ML IJ SOLN
INTRAMUSCULAR | Status: DC | PRN
  Administered 2012-11-10: 0.4 mg via INTRAVENOUS

## 2012-11-10 MED ORDER — ENOXAPARIN SODIUM 40 MG/0.4ML ~~LOC~~ SOLN
40.0000 mg | SUBCUTANEOUS | Status: DC
  Administered 2012-11-10: 40 mg via SUBCUTANEOUS
  Filled 2012-11-10: qty 0.4

## 2012-11-10 MED ORDER — SODIUM CHLORIDE 0.9 % IR SOLN
Status: DC | PRN
  Administered 2012-11-10: 1000 mL

## 2012-11-10 MED ORDER — PROPOFOL 10 MG/ML IV BOLUS
INTRAVENOUS | Status: DC | PRN
  Administered 2012-11-10: 150 mg via INTRAVENOUS

## 2012-11-10 MED ORDER — FENTANYL CITRATE 0.05 MG/ML IJ SOLN
INTRAMUSCULAR | Status: DC | PRN
  Administered 2012-11-10 (×3): 50 ug via INTRAVENOUS
  Administered 2012-11-10: 100 ug via INTRAVENOUS

## 2012-11-10 MED ORDER — ONDANSETRON HCL 4 MG/2ML IJ SOLN
4.0000 mg | Freq: Four times a day (QID) | INTRAMUSCULAR | Status: DC | PRN
  Administered 2012-11-10: 4 mg via INTRAVENOUS
  Filled 2012-11-10: qty 2

## 2012-11-10 MED ORDER — PROPOFOL 10 MG/ML IV EMUL
INTRAVENOUS | Status: AC
  Filled 2012-11-10: qty 20

## 2012-11-10 MED ORDER — SUCCINYLCHOLINE CHLORIDE 20 MG/ML IJ SOLN
INTRAMUSCULAR | Status: DC | PRN
  Administered 2012-11-10: 140 mg via INTRAVENOUS

## 2012-11-10 MED ORDER — HYDROCODONE-ACETAMINOPHEN 5-325 MG PO TABS: 1.0000 | ORAL_TABLET | Freq: Four times a day (QID) | ORAL | Status: DC | PRN

## 2012-11-10 MED ORDER — HYDROMORPHONE HCL PF 1 MG/ML IJ SOLN: 1.0000 mg | INTRAMUSCULAR | Status: DC | PRN

## 2012-11-10 MED ORDER — NEOSTIGMINE METHYLSULFATE 1 MG/ML IJ SOLN
INTRAMUSCULAR | Status: DC | PRN
  Administered 2012-11-10: 3 mg via INTRAVENOUS

## 2012-11-10 MED ORDER — PROMETHAZINE HCL 50 MG PO TABS: 25.0000 mg | ORAL_TABLET | Freq: Four times a day (QID) | ORAL | Status: DC | PRN

## 2012-11-10 MED ORDER — IOHEXOL 300 MG/ML  SOLN
50.0000 mL | Freq: Once | INTRAMUSCULAR | Status: AC | PRN
  Administered 2012-11-10: 50 mL via ORAL

## 2012-11-10 MED ORDER — HYDROCODONE-ACETAMINOPHEN 5-325 MG PO TABS: 1.0000 | ORAL_TABLET | ORAL | Status: DC | PRN

## 2012-11-10 MED ORDER — BUPIVACAINE HCL (PF) 0.5 % IJ SOLN
INTRAMUSCULAR | Status: AC
  Filled 2012-11-10: qty 30

## 2012-11-10 MED ORDER — MORPHINE SULFATE 4 MG/ML IJ SOLN: 4.0000 mg | INTRAMUSCULAR | Status: DC | PRN

## 2012-11-10 MED ORDER — PANTOPRAZOLE SODIUM 40 MG IV SOLR: 40.0000 mg | Freq: Every day | INTRAVENOUS | Status: DC

## 2012-11-10 MED ORDER — GLYCOPYRROLATE 0.2 MG/ML IJ SOLN
INTRAMUSCULAR | Status: AC
  Filled 2012-11-10: qty 2

## 2012-11-10 MED ORDER — ONDANSETRON HCL 4 MG/2ML IJ SOLN
INTRAMUSCULAR | Status: AC
  Filled 2012-11-10: qty 2

## 2012-11-10 MED ORDER — DEXTROSE 5 % IV SOLN: 2.0000 g | Freq: Once | INTRAVENOUS | Status: DC

## 2012-11-10 MED ORDER — ROCURONIUM BROMIDE 50 MG/5ML IV SOLN
INTRAVENOUS | Status: AC
  Filled 2012-11-10: qty 1

## 2012-11-10 MED ORDER — MIDAZOLAM HCL 5 MG/5ML IJ SOLN
INTRAMUSCULAR | Status: DC | PRN
  Administered 2012-11-10: 2 mg via INTRAVENOUS

## 2012-11-10 MED ORDER — ONDANSETRON HCL 4 MG/2ML IJ SOLN: 4.0000 mg | Freq: Four times a day (QID) | INTRAMUSCULAR | Status: DC | PRN

## 2012-11-10 MED ORDER — LACTATED RINGERS IV SOLN
INTRAVENOUS | Status: DC
  Administered 2012-11-10: 09:00:00 via INTRAVENOUS

## 2012-11-10 MED ORDER — CHLORHEXIDINE GLUCONATE CLOTH 2 % EX PADS: 6.0000 | MEDICATED_PAD | Freq: Every day | CUTANEOUS | Status: DC

## 2012-11-10 MED ORDER — IOHEXOL 300 MG/ML  SOLN
100.0000 mL | Freq: Once | INTRAMUSCULAR | Status: AC | PRN
  Administered 2012-11-10: 100 mL via INTRAVENOUS

## 2012-11-10 MED ORDER — ONDANSETRON HCL 4 MG PO TABS: 4.0000 mg | ORAL_TABLET | Freq: Four times a day (QID) | ORAL | Status: DC | PRN

## 2012-11-10 MED ORDER — MIDAZOLAM HCL 2 MG/2ML IJ SOLN
1.0000 mg | INTRAMUSCULAR | Status: DC | PRN
  Administered 2012-11-10: 2 mg via INTRAVENOUS

## 2012-11-10 MED ORDER — FENTANYL CITRATE 0.05 MG/ML IJ SOLN: 25.0000 ug | INTRAMUSCULAR | Status: DC | PRN

## 2012-11-10 MED ORDER — KETOROLAC TROMETHAMINE 30 MG/ML IJ SOLN
INTRAMUSCULAR | Status: AC
  Filled 2012-11-10: qty 1

## 2012-11-10 MED ORDER — ROCURONIUM BROMIDE 100 MG/10ML IV SOLN
INTRAVENOUS | Status: DC | PRN
  Administered 2012-11-10: 10 mg via INTRAVENOUS
  Administered 2012-11-10: 25 mg via INTRAVENOUS

## 2012-11-10 MED ORDER — SCOPOLAMINE 1 MG/3DAYS TD PT72
MEDICATED_PATCH | TRANSDERMAL | Status: AC
  Filled 2012-11-10: qty 1

## 2012-11-10 MED ORDER — MUPIROCIN 2 % EX OINT
1.0000 "application " | TOPICAL_OINTMENT | Freq: Two times a day (BID) | CUTANEOUS | Status: DC
  Filled 2012-11-10: qty 22

## 2012-11-10 MED ORDER — NEOSTIGMINE METHYLSULFATE 1 MG/ML IJ SOLN
INTRAMUSCULAR | Status: AC
  Filled 2012-11-10: qty 1

## 2012-11-10 MED ORDER — ONDANSETRON HCL 4 MG/2ML IJ SOLN
4.0000 mg | Freq: Once | INTRAMUSCULAR | Status: AC | PRN
  Administered 2012-11-10: 4 mg via INTRAVENOUS

## 2012-11-10 MED ORDER — CHLORHEXIDINE GLUCONATE CLOTH 2 % EX PADS: MEDICATED_PAD | Freq: Once | CUTANEOUS | Status: DC

## 2012-11-10 MED ORDER — PROMETHAZINE HCL 25 MG/ML IJ SOLN
12.5000 mg | Freq: Four times a day (QID) | INTRAMUSCULAR | Status: DC | PRN
  Administered 2012-11-10: 12.5 mg via INTRAVENOUS
  Filled 2012-11-10: qty 1

## 2012-11-10 MED ORDER — CEFOTETAN DISODIUM 1 G IJ SOLR
1.0000 g | Freq: Two times a day (BID) | INTRAMUSCULAR | Status: DC
  Administered 2012-11-10: 1 g via INTRAVENOUS
  Filled 2012-11-10 (×4): qty 1

## 2012-11-10 MED ORDER — MIDAZOLAM HCL 2 MG/2ML IJ SOLN
INTRAMUSCULAR | Status: AC
  Filled 2012-11-10: qty 2

## 2012-11-10 MED ORDER — FENTANYL CITRATE 0.05 MG/ML IJ SOLN
INTRAMUSCULAR | Status: AC
  Filled 2012-11-10: qty 5

## 2012-11-10 MED ORDER — LIDOCAINE HCL 1 % IJ SOLN
INTRAMUSCULAR | Status: DC | PRN
  Administered 2012-11-10: 50 mg via INTRADERMAL

## 2012-11-10 MED ORDER — LIDOCAINE HCL (PF) 1 % IJ SOLN
INTRAMUSCULAR | Status: AC
  Filled 2012-11-10: qty 5

## 2012-11-10 MED ORDER — KETOROLAC TROMETHAMINE 30 MG/ML IJ SOLN
30.0000 mg | Freq: Once | INTRAMUSCULAR | Status: AC
  Administered 2012-11-10: 30 mg via INTRAVENOUS

## 2012-11-10 MED ORDER — DEXAMETHASONE SODIUM PHOSPHATE 4 MG/ML IJ SOLN
INTRAMUSCULAR | Status: AC
  Filled 2012-11-10: qty 1

## 2012-11-10 MED ORDER — SODIUM CHLORIDE 0.9 % IV SOLN: INTRAVENOUS | Status: DC

## 2012-11-10 MED ORDER — DEXAMETHASONE SODIUM PHOSPHATE 4 MG/ML IJ SOLN
4.0000 mg | Freq: Once | INTRAMUSCULAR | Status: AC
  Administered 2012-11-10: 4 mg via INTRAVENOUS

## 2012-11-10 MED ORDER — SCOPOLAMINE 1 MG/3DAYS TD PT72
1.0000 | MEDICATED_PATCH | Freq: Once | TRANSDERMAL | Status: DC
  Administered 2012-11-10: 1.5 mg via TRANSDERMAL

## 2012-11-10 MED ORDER — LACTATED RINGERS IV SOLN
INTRAVENOUS | Status: DC
  Administered 2012-11-10: 11:00:00 via INTRAVENOUS

## 2012-11-10 MED ORDER — BUPIVACAINE HCL (PF) 0.5 % IJ SOLN
INTRAMUSCULAR | Status: DC | PRN
  Administered 2012-11-10: 10 mL

## 2012-11-10 MED ORDER — ONDANSETRON HCL 4 MG/2ML IJ SOLN: 4.0000 mg | Freq: Three times a day (TID) | INTRAMUSCULAR | Status: DC | PRN

## 2012-11-10 MED ORDER — SUCCINYLCHOLINE CHLORIDE 20 MG/ML IJ SOLN
INTRAMUSCULAR | Status: AC
  Filled 2012-11-10: qty 1

## 2012-11-10 SURGICAL SUPPLY — 42 items
BAG HAMPER (MISCELLANEOUS) ×2 IMPLANT
BAG SPEC RTRVL LRG 6X4 10 (ENDOMECHANICALS) ×1
CLOTH BEACON ORANGE TIMEOUT ST (SAFETY) ×2 IMPLANT
COVER LIGHT HANDLE STERIS (MISCELLANEOUS) ×4 IMPLANT
CUTTER LINEAR ENDO 35 ETS TH (STAPLE) ×1 IMPLANT
DECANTER SPIKE VIAL GLASS SM (MISCELLANEOUS) ×2 IMPLANT
DURAPREP 26ML APPLICATOR (WOUND CARE) ×2 IMPLANT
ELECT REM PT RETURN 9FT ADLT (ELECTROSURGICAL) ×2
ELECTRODE REM PT RTRN 9FT ADLT (ELECTROSURGICAL) ×1 IMPLANT
FORMALIN 10 PREFIL 120ML (MISCELLANEOUS) ×2 IMPLANT
GLOVE BIOGEL PI IND STRL 7.5 (GLOVE) IMPLANT
GLOVE BIOGEL PI INDICATOR 7.5 (GLOVE) ×1
GLOVE EXAM NITRILE LRG STRL (GLOVE) ×2 IMPLANT
GLOVE SKINSENSE NS SZ7.0 (GLOVE) ×1
GLOVE SKINSENSE NS SZ7.5 (GLOVE) ×1
GLOVE SKINSENSE STRL SZ7.0 (GLOVE) IMPLANT
GLOVE SKINSENSE STRL SZ7.5 (GLOVE) IMPLANT
GOWN STRL REIN XL XLG (GOWN DISPOSABLE) ×4 IMPLANT
INST SET LAPROSCOPIC AP (KITS) ×2 IMPLANT
KIT ROOM TURNOVER APOR (KITS) ×2 IMPLANT
MANIFOLD NEPTUNE II (INSTRUMENTS) ×2 IMPLANT
NDL INSUFFLATION 14GA 120MM (NEEDLE) ×1 IMPLANT
NEEDLE INSUFFLATION 14GA 120MM (NEEDLE) ×2 IMPLANT
NS IRRIG 1000ML POUR BTL (IV SOLUTION) ×2 IMPLANT
PACK LAP CHOLE LZT030E (CUSTOM PROCEDURE TRAY) ×2 IMPLANT
PAD ARMBOARD 7.5X6 YLW CONV (MISCELLANEOUS) ×2 IMPLANT
PENCIL HANDSWITCHING (ELECTRODE) ×1 IMPLANT
POUCH SPECIMEN RETRIEVAL 10MM (ENDOMECHANICALS) ×2 IMPLANT
SCALPEL HARMONIC ACE (MISCELLANEOUS) ×2 IMPLANT
SET BASIN LINEN APH (SET/KITS/TRAYS/PACK) ×2 IMPLANT
SPONGE GAUZE 2X2 8PLY STRL LF (GAUZE/BANDAGES/DRESSINGS) ×6 IMPLANT
STAPLER VISISTAT (STAPLE) ×2 IMPLANT
SUT VICRYL 0 UR6 27IN ABS (SUTURE) ×2 IMPLANT
SYRINGE 10CC LL (SYRINGE) ×1 IMPLANT
TAPE CLOTH SURG 4X10 WHT LF (GAUZE/BANDAGES/DRESSINGS) ×1 IMPLANT
TRAY FOLEY BAG SILVER LF 14FR (CATHETERS) ×1 IMPLANT
TROCAR Z-THAD FIOS HNDL 12X100 (TROCAR) ×2 IMPLANT
TROCAR Z-THRD FIOS HNDL 11X100 (TROCAR) ×2 IMPLANT
TROCAR Z-THREAD FIOS 5X100MM (TROCAR) ×2 IMPLANT
TUBING INSUFFLATION (TUBING) ×2 IMPLANT
WARMER LAPAROSCOPE (MISCELLANEOUS) ×2 IMPLANT
YANKAUER SUCT 12FT TUBE ARGYLE (SUCTIONS) ×2 IMPLANT

## 2012-11-10 NOTE — Progress Notes (Signed)
11/10/12 1755 Patient stated nausea much improved after phenergan this afternoon. Tolerated chicken noodle soup, saltine crackers, ginger ale this afternoon. Stated felt well enough for discharge as ordered. Notified Dr. Lovell Sheehan this afternoon, stated okay for discharge. Reviewed discharge instructions with patient this evening, husband at bedside. Given copy of instructions, medication list, prescriptions, f/u appointment information. Post-op care education sheet given and discussed. Patient verbalizes incision care, incentive spirometer, ambulation, and when to call MD. IV site d/c'd and within normal limits. States some abdominal discomfort with activity, does not want pain medication. Denies nausea. Pt in stable condition, eating supper, awaiting discharge. Earnstine Regal, RN

## 2012-11-10 NOTE — Anesthesia Postprocedure Evaluation (Signed)
  Anesthesia Post-op Note  Patient: [name]  Procedure(s) Performed: Procedure(s): APPENDECTOMY LAPAROSCOPIC (N/A)  Patient Location: PACU  Anesthesia Type:General  Level of Consciousness: awake, alert , oriented and patient cooperative  Airway and Oxygen Therapy: Patient Spontanous Breathing  Post-op Pain: 3 /10, mild  Post-op Assessment: Post-op Vital signs reviewed, Patient's Cardiovascular Status Stable, Respiratory Function Stable, Patent Airway, No signs of Nausea or vomiting and Pain level controlled  Post-op Vital Signs: Reviewed and stable  Complications: No apparent anesthesia complications

## 2012-11-10 NOTE — Progress Notes (Signed)
UR chart review completed.  

## 2012-11-10 NOTE — H&P (Signed)
[name] is an 57 y.o. female.   Chief Complaint: Abdominal pain HPI: Patient is a 58 year old white female who developed periumbilical pain which has radiated to the right lower quadrant over the past 24 hours. A CT scan the abdomen was performed which revealed early acute appendicitis in the distal tip.  Past Medical History  Diagnosis Date  . Tachycardia   . Cancer   . Renal disorder     Past Surgical History  Procedure Laterality Date  . Breast lumpectomy      No family history on file. Social History:  reports that she has never smoked. She does not have any smokeless tobacco history on file. She reports that she does not drink alcohol or use illicit drugs.  Allergies:  Allergies  Allergen Reactions  . Codeine Nausea And Vomiting  . Latex Itching    Medications Prior to Admission  Medication Sig Dispense Refill  . ALPRAZolam (XANAX) 0.5 MG tablet Take 0.25 mg by mouth at bedtime.      Marland Kitchen omeprazole (PRILOSEC) 20 MG capsule Take 20 mg by mouth daily.      Marland Kitchen PRESCRIPTION MEDICATION Take 12.5 mg by mouth daily. Taking 25 mg of Metoprolol but isn't sure if the tablet is ER or not.        Results for orders placed during the hospital encounter of 11/09/12 (from the past 48 hour(s))  CBC WITH DIFFERENTIAL     Status: Abnormal   Collection Time    11/09/12  6:33 PM      Result Value Range   WBC 6.6  4.0 - 10.5 K/uL   RBC 5.02  3.87 - 5.11 MIL/uL   Hemoglobin 16.1 (*) 12.0 - 15.0 g/dL   HCT 16.1 (*) 09.6 - 04.5 %   MCV 93.6  78.0 - 100.0 fL   MCH 32.1  26.0 - 34.0 pg   MCHC 34.3  30.0 - 36.0 g/dL   RDW 40.9  81.1 - 91.4 %   Platelets 230  150 - 400 K/uL   Neutrophils Relative % 52  43 - 77 %   Neutro Abs 3.4  1.7 - 7.7 K/uL   Lymphocytes Relative 35  12 - 46 %   Lymphs Abs 2.3  0.7 - 4.0 K/uL   Monocytes Relative 9  3 - 12 %   Monocytes Absolute 0.6  0.1 - 1.0 K/uL   Eosinophils Relative 4  0 - 5 %   Eosinophils Absolute 0.3  0.0 - 0.7 K/uL   Basophils  Relative 1  0 - 1 %   Basophils Absolute 0.0  0.0 - 0.1 K/uL  BASIC METABOLIC PANEL     Status: None   Collection Time    11/09/12  6:33 PM      Result Value Range   Sodium 141  135 - 145 mEq/L   Potassium 4.3  3.5 - 5.1 mEq/L   Chloride 103  96 - 112 mEq/L   CO2 28  19 - 32 mEq/L   Glucose, Bld 92  70 - 99 mg/dL   BUN 10  6 - 23 mg/dL   Creatinine, Ser 7.82  0.50 - 1.10 mg/dL   Calcium 95.6  8.4 - 21.3 mg/dL   GFR calc non Af Amer >90  >90 mL/min   GFR calc Af Amer >90  >90 mL/min   Comment:            The eGFR has been calculated     using  the CKD EPI equation.     This calculation has not been     validated in all clinical     situations.     eGFR's persistently     <90 mL/min signify     possible Chronic Kidney Disease.  URINALYSIS, ROUTINE W REFLEX MICROSCOPIC     Status: Abnormal   Collection Time    11/09/12  6:45 PM      Result Value Range   Color, Urine YELLOW  YELLOW   APPearance CLEAR  CLEAR   Specific Gravity, Urine 1.010  1.005 - 1.030   pH 7.5  5.0 - 8.0   Glucose, UA NEGATIVE  NEGATIVE mg/dL   Hgb urine dipstick SMALL (*) NEGATIVE   Bilirubin Urine NEGATIVE  NEGATIVE   Ketones, ur NEGATIVE  NEGATIVE mg/dL   Protein, ur NEGATIVE  NEGATIVE mg/dL   Urobilinogen, UA 0.2  0.0 - 1.0 mg/dL   Nitrite NEGATIVE  NEGATIVE   Leukocytes, UA SMALL (*) NEGATIVE  URINE MICROSCOPIC-ADD ON     Status: None   Collection Time    11/09/12  6:45 PM      Result Value Range   Squamous Epithelial / LPF RARE  RARE   WBC, UA 0-2  <3 WBC/hpf   RBC / HPF 0-2  <3 RBC/hpf   Bacteria, UA RARE  RARE  HEPATIC FUNCTION PANEL     Status: Abnormal   Collection Time    11/09/12 11:55 PM      Result Value Range   Total Protein 8.4 (*) 6.0 - 8.3 g/dL   Albumin 4.5  3.5 - 5.2 g/dL   AST 34  0 - 37 U/L   ALT 26  0 - 35 U/L   Alkaline Phosphatase 78  39 - 117 U/L   Total Bilirubin 0.3  0.3 - 1.2 mg/dL   Bilirubin, Direct <1.6  0.0 - 0.3 mg/dL   Indirect Bilirubin NOT CALCULATED  0.3 -  0.9 mg/dL  LIPASE, BLOOD     Status: None   Collection Time    11/09/12 11:55 PM      Result Value Range   Lipase 26  11 - 59 U/L   Ct Abdomen Pelvis W Contrast  11/10/2012   *RADIOLOGY REPORT*  Clinical Data: Right upper quadrant abdominal pain.  Query cholecystitis.  Pain for 4 hours.  Nausea.  CT ABDOMEN AND PELVIS WITH CONTRAST  Technique:  Multidetector CT imaging of the abdomen and pelvis was performed following the standard protocol during bolus administration of intravenous contrast.  Contrast: 50mL OMNIPAQUE IOHEXOL 300 MG/ML  SOLN, OMNIPAQUE IOHEXOL 300 MG/ML  SOLN  Comparison: 12/19/2008  Findings: Mild dependent changes in the lung bases.  The liver, spleen, gallbladder, pancreas, adrenal glands, kidneys, abdominal aorta, and retroperitoneal lymph nodes are unremarkable. Small esophageal hiatal hernia.  The stomach and small bowel are not abnormally distended and no wall thickening is appreciated. Contrast material flows to the colon without evidence of obstruction.  Prominent visceral adipose tissues.  Abdominal wall musculature appears intact.  No free air or free fluid in the abdomen.  Pelvis:  The appendiceal diameter is borderline enlarged at about 9 mm.  There is stranding around the distal appendix.  Changes suggest early acute tip appendicitis.  No evidence of abscess. This is along the appendix with retrocecal extension.  The uterus and ovaries are not enlarged.  Bladder wall is not thickened.  No free or loculated pelvic fluid collections.  No significant pelvic  lymphadenopathy.  Rectosigmoid colon is decompressed without evidence of diverticulitis.  Normal alignment of the lumbar vertebrae.  IMPRESSION: Mild inflammatory changes demonstrated around the appendix with borderline appendiceal increased diameter suggesting early acute tip appendicitis.   Original Report Authenticated By: Burman Nieves, M.D.    Review of Systems  Constitutional: Positive for malaise/fatigue.   Gastrointestinal: Positive for nausea and abdominal pain.  All other systems reviewed and are negative.    Blood pressure 134/74, pulse 86, temperature 98.3 F (36.8 C), temperature source Oral, resp. rate 20, height 5\' 6"  (1.676 m), weight 78.019 kg (172 lb), SpO2 98.00%. Physical Exam  Constitutional: She is oriented to person, place, and time. She appears well-developed and well-nourished.  HENT:  Head: Normocephalic and atraumatic.  Neck: Normal range of motion. Neck supple. No tracheal deviation present. No thyromegaly present.  Cardiovascular: Normal rate, regular rhythm and normal heart sounds.   Respiratory: Effort normal and breath sounds normal.  GI: Soft. Bowel sounds are normal. She exhibits no distension. There is tenderness.  Tenderness to palpation in right lower quadrant. No rigidity noted.  Neurological: She is alert and oriented to person, place, and time.  Skin: Skin is warm and dry.     Assessment/Plan Impression: Acute appendicitis Plan: Patient will be admitted to the hospital for IV fluids and antibiotics. She stopped going undergo a laparoscopic appendectomy. The risks and benefits of the procedure including bleeding, infection, and the possibility of an open procedure were fully explained to the patient, who gave informed consent.  Oneal Schoenberger A 11/10/2012, 8:20 AM

## 2012-11-10 NOTE — Plan of Care (Signed)
Problem: Phase I Progression Outcomes Goal: Pain controlled with appropriate interventions Outcome: Completed/Met Date Met:  11/10/12 11/10/12 1748 patient states comfortable at this time, no need for pain medication. Abdominal discomfort with activity, movement. Discussed use of pillow to splint abdomen with coughing, sneezing, movement, etc. States that has been helping with discomfort. Will continue to do so at home. Earnstine Regal, RN  Problem: Phase II Progression Outcomes Goal: Progressing with IS, TCDB Outcome: Completed/Met Date Met:  11/10/12 Discussed importance of ambulation, TCDB and incentive spirometer use. Pt verbalizes rationale for each. Earnstine Regal, RN

## 2012-11-10 NOTE — Anesthesia Procedure Notes (Signed)
Procedure Name: Intubation Date/Time: 11/10/2012 12:07 PM Performed by: Despina Hidden Pre-anesthesia Checklist: Emergency Drugs available, Patient identified, Suction available and Patient being monitored Patient Re-evaluated:Patient Re-evaluated prior to inductionOxygen Delivery Method: Circle system utilized Preoxygenation: Pre-oxygenation with 100% oxygen Intubation Type: IV induction, Rapid sequence and Cricoid Pressure applied Ventilation: Mask ventilation without difficulty and Oral airway inserted - appropriate to patient size Laryngoscope Size: Mac and 3 Grade View: Grade I Tube type: Oral Tube size: 7.0 mm Number of attempts: 1 Airway Equipment and Method: Stylet and Oral airway Placement Confirmation: ETT inserted through vocal cords under direct vision,  positive ETCO2 and breath sounds checked- equal and bilateral Secured at: 20 cm Dental Injury: Teeth and Oropharynx as per pre-operative assessment

## 2012-11-10 NOTE — Progress Notes (Signed)
11/10/12 1817 Discussed staph aureaus positive surgical PCR with patient this evening. Mupirocin ointment sent home with patient to complete as per protocol. Patient tolerated supper well, no complaints. Left floor in stable condition, preferred to walk instead of wheelchair, accompanied by husband and nurse. Discharged home. Earnstine Regal, RN

## 2012-11-10 NOTE — Anesthesia Preprocedure Evaluation (Addendum)
Anesthesia Evaluation  Patient identified by MRN, date of birth, ID band Patient awake    Reviewed: Allergy & Precautions, H&P , NPO status , Patient's Chart, lab work & pertinent test results  History of Anesthesia Complications (+) PONV  Airway Mallampati: I TM Distance: >3 FB Neck ROM: Full    Dental  (+) Teeth Intact and Implants   Pulmonary neg pulmonary ROS,  breath sounds clear to auscultation        Cardiovascular + dysrhythmias (hx tachycardia) + Valvular Problems/Murmurs (hx unknown valvular dx - asymptomatic now) Rhythm:Regular Rate:Normal     Neuro/Psych    GI/Hepatic GERD-  Medicated,  Endo/Other    Renal/GU      Musculoskeletal   Abdominal   Peds  Hematology   Anesthesia Other Findings   Reproductive/Obstetrics                          Anesthesia Physical Anesthesia Plan  ASA: II  Anesthesia Plan: General   Post-op Pain Management:    Induction: Intravenous, Rapid sequence and Cricoid pressure planned  Airway Management Planned: Oral ETT  Additional Equipment:   Intra-op Plan:   Post-operative Plan: Extubation in OR  Informed Consent: I have reviewed the patients History and Physical, chart, labs and discussed the procedure including the risks, benefits and alternatives for the proposed anesthesia with the patient or authorized representative who has indicated his/her understanding and acceptance.     Plan Discussed with:   Anesthesia Plan Comments:         Anesthesia Quick Evaluation

## 2012-11-10 NOTE — Transfer of Care (Signed)
Immediate Anesthesia Transfer of Care Note  Patient: [name]  Procedure(s) Performed: Procedure(s): APPENDECTOMY LAPAROSCOPIC (N/A)  Patient Location: PACU  Anesthesia Type:General  Level of Consciousness: awake and patient cooperative  Airway & Oxygen Therapy: Patient Spontanous Breathing and Patient connected to face mask oxygen  Post-op Assessment: Report given to PACU RN, Post -op Vital signs reviewed and stable and Patient moving all extremities  Post vital signs: Reviewed and stable  Complications: No apparent anesthesia complications

## 2012-11-10 NOTE — Op Note (Signed)
Patient:  [name]  DOB:  [birth year]  MRN:  [id number]   Preop Diagnosis:  Acute appendicitis  Postop Diagnosis:  Same  Procedure:  Laparoscopic appendectomy  Surgeon:  Franky Macho, M.D.  Anes:  General endotracheal  Indications:  Patient is a 57 year old white female presents with right lower quadrant abdominal pain. CT scan the abdomen reveals acute appendicitis. The risks and benefits of the procedure including bleeding, infection, and the possibility of an open procedure were fully explained to the patient, who gave informed consent.  Procedure note:  The patient is placed the supine position. After induction of general endotracheal anesthesia, the abdomen was prepped and draped using usual sterile technique with DuraPrep. Surgical site confirmation was performed.  A supraumbilical incision was made down to the fascia. A Veress needle was introduced into the abdominal cavity and confirmation of placement was done using the saline drop test. The abdomen was then insufflated to 16 mm mercury pressure. An 11 mm trocar was introduced into the abdominal cavity under direct visualization without difficulty. The patient was placed in deeper Trendelenburg position and additional 12mm trocar was placed the suprapubic region and a 5 mm trocar was placed left lower quadrant region. The appendix was visualized and noted to be diffusely inflamed. There was no evidence of perforation. The mesoappendix was divided using the harmonic scalpel. A vascular Endo GIA was placed across the base the appendix and fired. The appendix was then removed using an Endo Catch bag. The subcutaneous pathology further examination. The staple line was inspected and noted within normal limits. All fluid and air were then evacuated from the abdominal cavity prior to removal of the trochars.  All wounds were gave normal saline. All wounds were injected with 0.5% Sensorcaine. The suprabuccal fashion as well suprapubic  fascia were reapproximated using 0 Vicryl interrupted sutures. All skin incisions were closed using staples. Betadine ointment and dry sterile dressings were applied.  All tape and needle counts were correct at the end of the procedure. Patient was extubated in the operating room and transferred to PACU in stable condition.  Complications:  None  EBL:  Minimal  Specimen:  Appendix

## 2012-11-11 NOTE — Discharge Summary (Signed)
Physician Discharge Summary  Patient ID: [name] MRN: [id number] DOB/AGE: [birth year] 57 y.o.  Admit date: 11/10/12 Discharge date: 11/10/12  Admission Diagnoses: Acute appendicitis  Discharge Diagnoses: Acute appendicitis Active Problems:   * No active hospital problems. *   Discharged Condition: good  Hospital Course: Patient is a 57 year old white female who presented emergency room with worsening right lower corner abdominal pain. CT scan the abdomen revealed acute appendicitis without evidence of perforation. She was taken to the operating room on 11/10/2012 and underwent a laparoscopic appendectomy. She tolerated the procedure well. Her postoperative course was remarkable for some nausea which did resolve. She was discharged home on 11/10/2012 in good improving condition.  Treatments: surgery: Laparoscopic appendectomy on 11/10/2012  Discharge Exam: Blood pressure 120/72, pulse 97, temperature 98.5 F (36.9 C), temperature source Oral, resp. rate 20, height 5\' 6"  (1.676 m), weight 78.019 kg (172 lb), SpO2 96.00%. General appearance: alert and cooperative Resp: clear to auscultation bilaterally Cardio: regular rate and rhythm, S1, S2 normal, no murmur, click, rub or gallop GI: Soft. Dressings dry and intact.  Disposition: 01-Home or Self Care     Medication List         ALPRAZolam 0.5 MG tablet  Commonly known as:  XANAX  Take 0.25 mg by mouth at bedtime.     HYDROcodone-acetaminophen 5-325 MG per tablet  Commonly known as:  NORCO  Take 1-2 tablets by mouth every 6 (six) hours as needed for pain.     metoprolol tartrate 25 MG tablet  Commonly known as:  LOPRESSOR  Take 12.5 mg by mouth daily.     omeprazole 20 MG capsule  Commonly known as:  PRILOSEC  Take 20 mg by mouth daily.     promethazine 50 MG tablet  Commonly known as:  PHENERGAN  Take 0.5 tablets (25 mg total) by mouth every 6 (six) hours as needed for nausea.           Follow-up  Information   Follow up with Dalia Heading, MD. Schedule an appointment as soon as possible for a visit on 11/17/2012.   Contact information:   [address] [place] [address] [phone]       Signed: Franky Macho A 11/11/2012, 7:32 AM

## 2012-11-15 ENCOUNTER — Encounter (HOSPITAL_COMMUNITY): Payer: Self-pay | Admitting: General Surgery

## 2013-06-13 ENCOUNTER — Ambulatory Visit (INDEPENDENT_AMBULATORY_CARE_PROVIDER_SITE_OTHER): Payer: BC Managed Care – PPO | Admitting: Cardiovascular Disease

## 2013-06-13 ENCOUNTER — Encounter: Payer: Self-pay | Admitting: Cardiovascular Disease

## 2013-06-13 VITALS — BP 122/78 | HR 81 | Ht 66.5 in | Wt 171.5 lb

## 2013-06-13 DIAGNOSIS — I059 Rheumatic mitral valve disease, unspecified: Secondary | ICD-10-CM

## 2013-06-13 DIAGNOSIS — R002 Palpitations: Secondary | ICD-10-CM

## 2013-06-13 DIAGNOSIS — I340 Nonrheumatic mitral (valve) insufficiency: Secondary | ICD-10-CM | POA: Insufficient documentation

## 2013-06-13 MED ORDER — ALPRAZOLAM 0.5 MG PO TABS: 0.2500 mg | ORAL_TABLET | Freq: Every day | ORAL | Status: DC

## 2013-06-13 NOTE — Patient Instructions (Signed)
Your physician recommends that you schedule a follow-up appointment in: AS NEEDED  

## 2013-06-13 NOTE — Progress Notes (Signed)
Patient ID: [name], female   DOB: [birth year], 58 y.o.   MRN: [id number]      Reason for office visit Palpitations, mitral insufficiency  [name] is a 58 year old woman who initially presented with palpitations and had benign findings on workup. Her palpitations have been very well controlled with a low dose of beta blocker. Her echocardiogram showed mild mitral insufficiency without clear evidence of mitral valve prolapse but otherwise normal findings including normal left ventricular systolic function. She has no history of coronary problems or symptoms of angina pectoris. Since her last appointment she had to undergo an appendectomy. She has symptoms of an early upper respiratory tract infection with nasal congestion and some muscle aches.     Allergies  Allergen Reactions  . Codeine Nausea And Vomiting  . Latex Itching    Current Outpatient Prescriptions  Medication Sig Dispense Refill  . ALPRAZolam (XANAX) 0.5 MG tablet Take 0.5 tablets (0.25 mg total) by mouth at bedtime.  15 tablet  0  . KRILL OIL PO Take by mouth.      . metoprolol tartrate (LOPRESSOR) 25 MG tablet Take 12.5 mg by mouth 2 (two) times daily.       Marland Kitchen omeprazole (PRILOSEC) 20 MG capsule Take 20 mg by mouth daily.       No current facility-administered medications for this visit.    Past Medical History  Diagnosis Date  . Tachycardia   . Cancer   . Renal disorder   . Complication of anesthesia   . PONV (postoperative nausea and vomiting)     Past Surgical History  Procedure Laterality Date  . Breast lumpectomy    . Laparoscopic appendectomy N/A 11/10/2012    Procedure: APPENDECTOMY LAPAROSCOPIC;  Surgeon: Jamesetta So, MD;  Location: [facility];  Service: General;  Laterality: N/A;    No family history on file.  History   Social History  . Marital Status: Married    Spouse Name: N/A    Number of Children: N/A  . Years of Education: N/A   Occupational History  . Not on file.    Social History Main Topics  . Smoking status: Never Smoker   . Smokeless tobacco: Not on file  . Alcohol Use: No  . Drug Use: No  . Sexual Activity: No   Other Topics Concern  . Not on file   Social History Narrative  . No narrative on file    Review of systems: The patient specifically denies any chest pain at rest or with exertion, dyspnea at rest or with exertion, orthopnea, paroxysmal nocturnal dyspnea, syncope, palpitations, focal neurological deficits, intermittent claudication, lower extremity edema, unexplained weight gain, cough, hemoptysis or wheezing.  The patient also denies abdominal pain, nausea, vomiting, dysphagia, diarrhea, constipation, polyuria, polydipsia, dysuria, hematuria, frequency, urgency, abnormal bleeding or bruising, fever, chills, unexpected weight changes, mood swings, change in skin or hair texture, change in voice quality, auditory or visual problems, allergic reactions or rashes, new musculoskeletal complaints other than usual "aches and pains".   PHYSICAL EXAM BP 122/78  Pulse 81  Ht 5' 6.5" (1.689 m)  Wt 77.792 kg (171 lb 8 oz)  BMI 27.27 kg/m2  General: Alert, oriented x3, no distress Head: no evidence of trauma, PERRL, EOMI, no exophtalmos or lid lag, no myxedema, no xanthelasma; normal ears, nose and oropharynx Neck: normal jugular venous pulsations and no hepatojugular reflux; brisk carotid pulses without delay and no carotid bruits Chest: clear to auscultation, no signs of consolidation by  percussion or palpation, normal fremitus, symmetrical and full respiratory excursions Cardiovascular: normal position and quality of the apical impulse, regular rhythm, normal first and second heart sounds, no murmurs, rubs or gallops Abdomen: no tenderness or distention, no masses by palpation, no abnormal pulsatility or arterial bruits, normal bowel sounds, no hepatosplenomegaly Extremities: no clubbing, cyanosis or edema; 2+ radial, ulnar and brachial  pulses bilaterally; 2+ right femoral, posterior tibial and dorsalis pedis pulses; 2+ left femoral, posterior tibial and dorsalis pedis pulses; no subclavian or femoral bruits Neurological: grossly nonfocal   EKG: NSR  BMET    Component Value Date/Time   NA 141 11/09/2012 1833   K 4.3 11/09/2012 1833   CL 103 11/09/2012 1833   CO2 28 11/09/2012 1833   GLUCOSE 92 11/09/2012 1833   BUN 10 11/09/2012 1833   CREATININE 0.68 11/10/2012 0910   CALCIUM 10.1 11/09/2012 1833   GFRNONAA >90 11/10/2012 0910   GFRAA >90 11/10/2012 0910     ASSESSMENT AND PLAN  [name] palpitations have been very well controlled. She knows to avoid caffeine and has noticed that her palpitations are worsened by eating chocolate. She is warned to also avoid decongestants for her current cold. Repeat ultrasonography evaluation for her mild and asymptomatic mitral insufficiency does not appear to be necessary. She will followup as needed basis.  Orders Placed This Encounter  Procedures  . EKG 12-Lead   Meds ordered this encounter  Medications  . KRILL OIL PO    Sig: Take by mouth.  . ALPRAZolam (XANAX) 0.5 MG tablet    Sig: Take 0.5 tablets (0.25 mg total) by mouth at bedtime.    Dispense:  15 tablet    Refill:  0    [name]  Katherine Klein, MD, [facility] [phone] office [phone] pager

## 2013-08-16 ENCOUNTER — Other Ambulatory Visit: Payer: Self-pay

## 2013-08-16 DIAGNOSIS — Z1231 Encounter for screening mammogram for malignant neoplasm of breast: Secondary | ICD-10-CM

## 2013-10-05 ENCOUNTER — Ambulatory Visit: Payer: BC Managed Care – PPO

## 2013-10-26 ENCOUNTER — Encounter (INDEPENDENT_AMBULATORY_CARE_PROVIDER_SITE_OTHER): Payer: Self-pay

## 2013-10-26 ENCOUNTER — Ambulatory Visit
Admission: RE | Admit: 2013-10-26 | Discharge: 2013-10-26 | Disposition: A | Discharge status: Home / Self Care | Payer: BC Managed Care – PPO | Source: Ambulatory Visit

## 2013-10-26 DIAGNOSIS — Z1231 Encounter for screening mammogram for malignant neoplasm of breast: Secondary | ICD-10-CM

## 2013-10-30 ENCOUNTER — Other Ambulatory Visit: Payer: Self-pay | Admitting: Family Medicine

## 2013-10-30 DIAGNOSIS — R928 Other abnormal and inconclusive findings on diagnostic imaging of breast: Secondary | ICD-10-CM

## 2013-11-08 ENCOUNTER — Ambulatory Visit
Admission: RE | Admit: 2013-11-08 | Discharge: 2013-11-08 | Disposition: A | Discharge status: Home / Self Care | Payer: BC Managed Care – PPO | Source: Ambulatory Visit | Attending: Family Medicine | Admitting: Family Medicine

## 2013-11-08 DIAGNOSIS — R928 Other abnormal and inconclusive findings on diagnostic imaging of breast: Secondary | ICD-10-CM

## 2013-11-16 ENCOUNTER — Other Ambulatory Visit: Payer: Self-pay | Admitting: Obstetrics and Gynecology

## 2013-11-17 LAB — CYTOLOGY - PAP

## 2014-07-17 ENCOUNTER — Ambulatory Visit: Payer: 59 | Admitting: Neurology

## 2014-09-12 ENCOUNTER — Other Ambulatory Visit: Payer: Self-pay

## 2014-09-12 DIAGNOSIS — Z1231 Encounter for screening mammogram for malignant neoplasm of breast: Secondary | ICD-10-CM

## 2014-11-30 ENCOUNTER — Encounter: Payer: Self-pay | Admitting: Cardiovascular Disease

## 2014-12-13 ENCOUNTER — Ambulatory Visit
Admission: RE | Admit: 2014-12-13 | Discharge: 2014-12-13 | Disposition: A | Discharge status: Home / Self Care | Payer: 59 | Source: Ambulatory Visit

## 2014-12-13 ENCOUNTER — Other Ambulatory Visit: Payer: Self-pay | Admitting: Obstetrics and Gynecology

## 2014-12-13 DIAGNOSIS — Z1231 Encounter for screening mammogram for malignant neoplasm of breast: Secondary | ICD-10-CM

## 2014-12-14 LAB — CYTOLOGY - PAP

## 2014-12-31 ENCOUNTER — Encounter (HOSPITAL_COMMUNITY): Payer: Self-pay | Admitting: Cardiology

## 2014-12-31 ENCOUNTER — Emergency Department (HOSPITAL_COMMUNITY)
Admission: EM | Admit: 2014-12-31 | Discharge: 2015-01-01 | Disposition: A | Discharge status: Home / Self Care | Payer: 59 | Attending: Emergency Medicine | Admitting: Emergency Medicine

## 2014-12-31 ENCOUNTER — Emergency Department (HOSPITAL_COMMUNITY): Payer: 59

## 2014-12-31 DIAGNOSIS — R002 Palpitations: Secondary | ICD-10-CM

## 2014-12-31 DIAGNOSIS — M542 Cervicalgia: Secondary | ICD-10-CM | POA: Diagnosis not present

## 2014-12-31 DIAGNOSIS — R079 Chest pain, unspecified: Secondary | ICD-10-CM | POA: Insufficient documentation

## 2014-12-31 DIAGNOSIS — Z79899 Other long term (current) drug therapy: Secondary | ICD-10-CM | POA: Insufficient documentation

## 2014-12-31 DIAGNOSIS — Z859 Personal history of malignant neoplasm, unspecified: Secondary | ICD-10-CM | POA: Diagnosis not present

## 2014-12-31 DIAGNOSIS — Z7982 Long term (current) use of aspirin: Secondary | ICD-10-CM | POA: Diagnosis not present

## 2014-12-31 DIAGNOSIS — Z91040 Latex allergy status: Secondary | ICD-10-CM | POA: Diagnosis not present

## 2014-12-31 LAB — I-STAT CHEM 8, ED
BUN: 12 mg/dL (ref 6–20)
CALCIUM ION: 1.17 mmol/L (ref 1.13–1.30)
CHLORIDE: 102 mmol/L (ref 101–111)
Creatinine, Ser: 0.7 mg/dL (ref 0.44–1.00)
GLUCOSE: 86 mg/dL (ref 65–99)
HCT: 51 % — ABNORMAL HIGH (ref 36.0–46.0)
Hemoglobin: 17.3 g/dL — ABNORMAL HIGH (ref 12.0–15.0)
Potassium: 3.7 mmol/L (ref 3.5–5.1)
Sodium: 139 mmol/L (ref 135–145)
TCO2: 25 mmol/L (ref 0–100)

## 2014-12-31 LAB — CBC WITH DIFFERENTIAL/PLATELET
Basophils Absolute: 0 10*3/uL (ref 0.0–0.1)
Basophils Relative: 0 % (ref 0–1)
Eosinophils Absolute: 0.3 10*3/uL (ref 0.0–0.7)
Eosinophils Relative: 3 % (ref 0–5)
HEMATOCRIT: 46.7 % — AB (ref 36.0–46.0)
HEMOGLOBIN: 15.9 g/dL — AB (ref 12.0–15.0)
LYMPHS ABS: 2.7 10*3/uL (ref 0.7–4.0)
LYMPHS PCT: 35 % (ref 12–46)
MCH: 32.3 pg (ref 26.0–34.0)
MCHC: 34 g/dL (ref 30.0–36.0)
MCV: 94.9 fL (ref 78.0–100.0)
Monocytes Absolute: 0.5 10*3/uL (ref 0.1–1.0)
Monocytes Relative: 6 % (ref 3–12)
NEUTROS ABS: 4.4 10*3/uL (ref 1.7–7.7)
NEUTROS PCT: 56 % (ref 43–77)
Platelets: 270 10*3/uL (ref 150–400)
RBC: 4.92 MIL/uL (ref 3.87–5.11)
RDW: 12.5 % (ref 11.5–15.5)
WBC: 7.9 10*3/uL (ref 4.0–10.5)

## 2014-12-31 LAB — I-STAT TROPONIN, ED
Troponin i, poc: 0 ng/mL (ref 0.00–0.08)
Troponin i, poc: 0 ng/mL (ref 0.00–0.08)

## 2014-12-31 MED ORDER — OXYCODONE-ACETAMINOPHEN 5-325 MG PO TABS
1.0000 | ORAL_TABLET | Freq: Once | ORAL | Status: DC
  Filled 2014-12-31: qty 1

## 2014-12-31 MED ORDER — METHOCARBAMOL 500 MG PO TABS: 500.0000 mg | ORAL_TABLET | Freq: Two times a day (BID) | ORAL | Status: DC | PRN

## 2014-12-31 MED ORDER — METOPROLOL TARTRATE 25 MG PO TABS
12.5000 mg | ORAL_TABLET | Freq: Once | ORAL | Status: DC
  Filled 2014-12-31: qty 1

## 2014-12-31 NOTE — ED Notes (Signed)
Seen at PCP today for neck pain and chest pain. Sent here to the er .  Told her heart rate was high

## 2014-12-31 NOTE — ED Notes (Signed)
Pt states that she had Metoprolol in her purse and took one half of pill that she stated was Metoprolol.  Pill was not in prescription bottle but was in pill organizer.

## 2014-12-31 NOTE — Discharge Instructions (Signed)
°Emergency Department Resource Guide °1) Find a Doctor and Pay Out of Pocket °Although you won't have to find out who is covered by your insurance plan, it is a good idea to ask around and get recommendations. You will then need to call the office and see if the doctor you have chosen will accept you as a new patient and what types of options they offer for patients who are self-pay. Some doctors offer discounts or will set up payment plans for their patients who do not have insurance, but you will need to ask so you aren't surprised when you get to your appointment. ° °2) Contact Your Local Health Department °Not all health departments have doctors that can see patients for sick visits, but many do, so it is worth a call to see if yours does. If you don't know where your local health department is, you can check in your phone book. The CDC also has a tool to help you locate your state's health department, and many state websites also have listings of all of their local health departments. ° °3) Find a Walk-in Clinic °If your illness is not likely to be very severe or complicated, you may want to try a walk in clinic. These are popping up all over the country in pharmacies, drugstores, and shopping centers. They're usually staffed by nurse practitioners or physician assistants that have been trained to treat common illnesses and complaints. They're usually fairly quick and inexpensive. However, if you have serious medical issues or chronic medical problems, these are probably not your best option. ° °No Primary Care Doctor: °- Call [facility] Connect at  832-8000 - they can help you locate a primary care doctor that  accepts your insurance, provides certain services, etc. °- Physician Referral Service- 1-800-533-3463 ° °Chronic Pain Problems: °Organization         Address  Phone   Notes  °[facility]  (336) [phone] Patients need to be referred by their primary care doctor.  ° °Medication  Assistance: °Organization         Address  Phone   Notes  °[place] [facility] [address] °[place], [place] [address] (336) 641-8030 --Must be a resident of [place] °-- Must have NO insurance coverage whatsoever (no Medicaid/ Medicare, etc.) °-- The pt. MUST have a primary care doctor that directs their care regularly and follows them in the community °  °[facility]  (866) 331-1348   °[employer]  (888) 892-1162   ° °Agencies that provide inexpensive medical care: °Organization         Address  Phone   Notes  °[facility]  (336) 832-8035   °[facility]    (336) 832-7272   °[facility] [address] °[place], [place] [address] (336) 832-4777   °[facility] [address], °[place] (336) 271-4999   °[facility]    (336) 373-0678   °[facility]    (336) 272-1050   °[facility] ° [address], [place] Phone:  (336) 832-4444, Fax:  (336) 832-4440 Hours of Operation:  9 am - 6 pm, M-F.  Also accepts Medicaid/Medicare and self-pay.  °[facility] ° [address],  Phone: (336) 832-3150, Fax: (336) 832-3151. Hours of Operation:  8:30 am - 5:30 pm, M-F.  Also accepts Medicaid and self-pay.  °[facility] [place] [address], [place] Phone: (336) 878-6027   °[facility] [address], [place], [place] (336)723-1848, Ext. 123 Mondays & Thursdays: 7-9 AM.  First 15 patients are seen on a first come, first serve basis. °  ° °Medicaid-accepting [place] Providers: ° °Organization         Address  Phone   Notes  °[facility] [address], [place] (336) 641-2100 Also accepts self-pay patients.  °[facility] [address], [place] ° (336) 856-9996   °[facility] [address], [place]  (336) 288-8857   °[facility] [address] [place] [address], [place] (336) 299-7000   °Veita Bland [address], [place]  ° (336) 373-1557 Only accepts [place] Access Medicaid patients after they have their name applied to their card.  ° °Self-Pay (no insurance) in [place]: ° °Organization         Address  Phone   Notes  °Sickle Cell Patients, [facility] [address], [place] (336) 832-1970   °[facility] [address], [place] (336) 832-4400   °[facility] ° [address], [place] (336) 992-4800   °[facility]/Dr. Osei-Bonsu ° [address] [place] [address], [place] or [address], [place] (336) 841-8500 Phone number for both [place] and [place] locations is the same.  °[facility] [address], [place] (336) 299-0000   °[facility] [address] [place] [address], [place] or [address] (336) 852-7530 °(336) 878-2260   °[facility] [address], [place] (336) 350-1642, phone; (336) 294-5005, fax Sees patients 1st and 3rd Saturday of every month.  Must not qualify for public or private insurance (i.e. Medicaid, Medicare, [place] Health Choice, Veterans' Benefits) • Household income should be no more than 200% of the poverty level •The clinic cannot treat you if you are pregnant or think you are pregnant • Sexually transmitted diseases are not treated at the clinic.  ° ° °Dental Care: °Organization         Address  Phone  Notes  °[place] [facility] [address], [place] (336) 641-6152 Accepts children up to age 21 who are enrolled in Medicaid or [place] Health Choice; pregnant women with a Medicaid card; and children who have applied for Medicaid or [place] Health Choice, but were declined, whose parents can pay a reduced fee at time of service.  °[place]  [facility] [place]  [address], [place] (336) 641-7733 Accepts children up to age 21 who are enrolled in Medicaid or [place] Health Choice; pregnant women with a Medicaid card; and children who have applied for Medicaid or [place] Health Choice, but were declined, whose parents can pay a reduced fee at time of service.  °[facility] ° [address],  (336) 641-4533 Patients are seen by appointment only. Walk-ins are not accepted. [facility] will see patients 18 years of age and older. °Monday - Tuesday (8am-5pm) °Most Wednesdays (8:30-5pm) °$30 per visit, cash only  °[facility] ° [address], [place] (336) 641-4533 Patients are seen by appointment only. Walk-ins are not accepted. [facility] will see patients 18 years of age and older. °One   Wednesday Evening (Monthly: Volunteer Based).  $30 per visit, cash only  °[facility]  (919) 537-3737 for adults; Children under age 4, call Graduate Pediatric Dentistry at (919) [phone]. Children aged 4-14, please call (919) [phone] to request a pediatric application. ° Dental services are provided in all areas of dental care including fillings, crowns and bridges, complete and partial dentures, implants, gum treatment, root canals, and extractions. Preventive care is also provided. Treatment is provided to both adults and children. °Patients are selected via a lottery and there is often a waiting list. °  °[facility] [address], °[place] ° (336) 763-8833 [email or web address] °  °[facility] [address], [place], [place] (336)723-1848, Ext. 123 Second and Fourth Thursday of each month, opens at 6:30 AM; Clinic ends at 9 AM.  Patients are seen on a first-come first-served basis, and a limited number are seen during each clinic.  ° °[facility] ° [address], [place], [place] (336) 723-7904    Eligibility Requirements °You must have lived in [place], [place], or [place] counties for at least the last three months. °  You cannot be eligible for state or federal sponsored healthcare insurance, including Veterans Administration, Medicaid, or Medicare. °  You generally cannot be eligible for healthcare insurance through your employer.  °  How to apply: °Eligibility screenings are held every Tuesday and Wednesday afternoon from 1:00 pm until 4:00 pm. You do not need an appointment for the interview!  °[facility] [address], [place], [place] 336-631-2330   °[facility]  336-342-8273   °[facility]  336-703-3100   °[facility]  336-570-6415   ° °Behavioral Health Resources in the Community: °Intensive Outpatient Programs °Organization         Address  Phone  Notes  °[facility] [address], [place], [place] 336-878-6098   °[facility] [address], [place], [place] 336-832-9800   °[facility] [address], [place], [place] ° 336-882-2125   °[facility] [address],  °[place], [place] 1-800-853-5163 or 336-641-4981   °Substance Abuse Resources °Organization         Address  Phone  Notes  °[facility]  336-882-2125   °[facility]  336-784-9470   °[facility]  336-285-9073   °[facility]  336-845-3988   °Residential & Outpatient Substance Abuse Program  1-800-659-3381   °Psychological Services °Organization         Address  Phone  Notes  °[facility]  336- 832-9600   °[facility]  336- 378-7881   °[facility] [address], [place] 1-800-853-5163 or 336-641-4981   ° °Mobile Crisis Teams °Organization         Address  Phone  Notes  °[facility], Mobile Crisis Care Unit  1-877-626-1772   °Assertive °[facility] ° [address]  [place], [place] 336-834-9664   °Sharon DeEsch [address] °[place]  336-554-5454   ° °Self-Help/Support Groups °Organization         Address  Phone             Notes  °[facility] - variety of support groups  336- 373-1402 Call for more information  °Narcotics Anonymous (NA), [facility] [address], °[place]   2 meetings at this location  ° °  Residential Treatment Programs Organization         Address  Phone  Notes  [facility] [address],    [place]  1-763 143 0664   [facility]  [address] [place] [address], [place], [place]   [place], [place] 979-589-4347 Admissions: 8am-3pm M-F  [facility] [place] [address],    [place], [place] 628-315-1761   [facility] [address] [place], [place], [place]   [facility] [address],  [place], [place]   [facility] Intensive Outpatient [address], [place], [place]   [facility] [address]  [place], [place] 1-406-144-8394 or 773-866-5774   [facility] [address], [place], [place] Accepts Medicaid  [facility] [address],  [place] 1-217-679-3214 Substance Abuse/Addiction Treatment   [facility]         Address  Phone  Notes  [facility]  581-658-8084   Domenic Schwab, PhD [address] [place], [place]   [phone] or [phone]   [facility] [place], [place] [phone]   [facility] [address] [place], [place] (403)714-8110 Insurance/Medicaid/sponsorship through [facility] [address]                                    [place], [place] [phone] [place] [address], [place] [phone]    Dr. Adele Schilder  [phone]   [facility] Dept. 1) [address], [place] 2) [place] 3)  [address], [place] 303-328-0232 250-235-4249  (201)[phone]   [place] 2298114438 or ([phone])[phone] (After Hours)      Take the prescription as directed. May also take over the counter tylenol and ibuprofen, as directed on packaging, as needed for discomfort.  Apply moist heat or ice to the area(s) of discomfort, for 15 minutes at a time, several times per day for the next few days.  Do not fall asleep on a heating or ice pack.  Call your regular medical doctor tomorrow to schedule a follow up appointment this week.  Avoid avoid caffinated products, such as teas, colas, coffee, chocolate. Avoid over the counter cold medicines, herbal or "natural vitamin" products, and illicit drugs because they can contain stimulants. Take your metoprolol twice per day, as prescribed. Keep a diary of your symptoms to show your Cardiologist at your next visit. Call your regular Cardiologist tomorrow morning to schedule a follow up appointment in the next 2 days.  Return to the Emergency Department immediately if worsening.

## 2014-12-31 NOTE — ED Provider Notes (Signed)
CSN: [id number]     Arrival date & time 12/31/14  1601 History   First MD Initiated Contact with Patient 12/31/14 1612     Chief Complaint  Patient presents with  . Chest Pain  . Palpitations      HPI  Pt was seen at 1630. Per pt, c/o gradual onset and persistence of multiple intermittent episodes of "palpitations" for the past several months to year. Pt states she has been evaluated by her Cards MD last year for same, rx metoprolol. Pt states she was evaluated by her PMD today and was told to come to the ED for a "high heart rate." Pt states her HR can get as high as "120's" at home. Pt states she has had intermittent "burning" mid-sternal chest pain for the past 1 to 2 months. States this discomfort lasts for a few minutes before spontaneously resolving. Pt states she does not know what makes it worse or better. Pt is also unsure if this "burning" chest discomfort occurs when she feels palpitations. Pt states she had an episode of this chest discomfort, as well as palpitations, that began at 1330/1400 today, and her PMD gave her a SL ntg for her symptoms without improvement.  Denies SOB/cough, no abd pain, no N/V/D, no Pt also c/o gradual onset and persistence of constant right sided neck "pain" for the past 3+ months. Describes the discomfort as "aching." Pain worsens with palpation of the area and body position changes. Denies any change in her neck pain over the past 3+ months. Pt states she has been evaluated by her PMD for same x2, "but they're not doing anything about it." Denies incont/retention of bowel or bladder, no saddle anesthesia, no focal motor weakness, no tingling/numbness in extremities, no fevers, no injury, no abd pain.      Cards: Dr. Loletha Grayer Past Medical History  Diagnosis Date  . Tachycardia   . Cancer   . Complication of anesthesia   . PONV (postoperative nausea and vomiting)   . SOB (shortness of breath)   . Palpitations   . PAC (premature atrial contraction)   .  Hematuria   . DDD (degenerative disc disease), cervical   . DDD (degenerative disc disease), lumbar   . History of echocardiogram 2015    mild mital insufficiency, otherwise normal   Past Surgical History  Procedure Laterality Date  . Breast lumpectomy    . Laparoscopic appendectomy N/A 11/10/2012    Procedure: APPENDECTOMY LAPAROSCOPIC;  Surgeon: Jamesetta So, MD;  Location: [facility];  Service: General;  Laterality: N/A;    Social History  Substance Use Topics  . Smoking status: Never Smoker   . Smokeless tobacco: None  . Alcohol Use: No    Review of Systems ROS: Statement: All systems negative except as marked or noted in the HPI; Constitutional: Negative for fever and chills. ; ; Eyes: Negative for eye pain, redness and discharge. ; ; ENMT: Negative for ear pain, hoarseness, nasal congestion, sinus pressure and sore throat. ; ; Cardiovascular: +CP, palpitations. Negative for diaphoresis, dyspnea and peripheral edema. ; ; Respiratory: Negative for cough, wheezing and stridor. ; ; Gastrointestinal: Negative for nausea, vomiting, diarrhea, abdominal pain, blood in stool, hematemesis, jaundice and rectal bleeding. . ; ; Genitourinary: Negative for dysuria, flank pain and hematuria. ; ; Musculoskeletal: +neck pain. Negative for back pain.  Negative for swelling and trauma.; ; Skin: Negative for pruritus, rash, abrasions, blisters, bruising and skin lesion.; ; Neuro: Negative for headache, lightheadedness and neck  stiffness. Negative for weakness, altered level of consciousness , altered mental status, extremity weakness, paresthesias, involuntary movement, seizure and syncope.      Allergies  Codeine; Latex; and Naprosyn  Home Medications   Prior to Admission medications   Medication Sig Start Date End Date Taking? Authorizing Provider  ALPRAZolam Duanne Moron) 0.5 MG tablet Take 0.5 tablets (0.25 mg total) by mouth at bedtime. 06/13/13   Mihai Croitoru, MD  aspirin 81 MG tablet Take 81 mg by  mouth daily.    Historical Provider, MD  KRILL OIL PO Take by mouth.    Historical Provider, MD  metoprolol tartrate (LOPRESSOR) 25 MG tablet Take 12.5 mg by mouth 2 (two) times daily.     Historical Provider, MD  omeprazole (PRILOSEC) 20 MG capsule Take 20 mg by mouth daily.    Historical Provider, MD   BP 135/83 mmHg  Pulse 98  Resp 16  SpO2 98% Physical Exam  1635: Physical examination:  Nursing notes reviewed; Vital signs and O2 SAT reviewed;  Constitutional: Well developed, Well nourished, Well hydrated, In no acute distress; Head:  Normocephalic, atraumatic; Eyes: EOMI, PERRL, No scleral icterus; ENMT: Mouth and pharynx normal, Mucous membranes moist; Neck: Supple, Full range of motion, No lymphadenopathy; Cardiovascular: Regular rate and rhythm, No gallop; Respiratory: Breath sounds clear & equal bilaterally, No wheezes.  Speaking full sentences with ease, Normal respiratory effort/excursion; Chest: Nontender, Movement normal; Abdomen: Soft, Nontender, Nondistended, Normal bowel sounds; Genitourinary: No CVA tenderness; Spine:  No midline CS, TS, LS tenderness. +TTP right cervical paraspinal muscles, +TTP right SCM muscle. No rash, no soft tissue crepitus, no palp masses.;; Extremities: Pulses normal, No tenderness, No edema, No calf edema or asymmetry.; Neuro: AA&Ox3, vague historian. Major CN grossly intact.  Speech clear. No gross focal motor or sensory deficits in extremities.; Skin: Color normal, Warm, Dry.   ED Course  Procedures (including critical care time)  Labs Review   Imaging Review  I have personally reviewed and evaluated these images and lab results as part of my medical decision-making.   EKG Interpretation   Date/Time:  Monday December 31 2014 16:08:03 EDT Ventricular Rate:  99 PR Interval:  147 QRS Duration: 85 QT Interval:  356 QTC Calculation: 457 R Axis:   60 Text Interpretation:  Sinus rhythm Right atrial enlargement Baseline  wander No old tracing to  compare Confirmed by [facility]  MD, Nunzio Cory  [id number]) on 12/31/2014 4:48:31 PM      MDM  MDM Reviewed: previous chart, nursing note and vitals Reviewed previous: labs Interpretation: labs and x-ray    Results for orders placed or performed during the hospital encounter of 12/31/14  CBC with Differential  Result Value Ref Range   WBC 7.9 4.0 - 10.5 K/uL   RBC 4.92 3.87 - 5.11 MIL/uL   Hemoglobin 15.9 (H) 12.0 - 15.0 g/dL   HCT 46.7 (H) 36.0 - 46.0 %   MCV 94.9 78.0 - 100.0 fL   MCH 32.3 26.0 - 34.0 pg   MCHC 34.0 30.0 - 36.0 g/dL   RDW 12.5 11.5 - 15.5 %   Platelets 270 150 - 400 K/uL   Neutrophils Relative % 56 43 - 77 %   Neutro Abs 4.4 1.7 - 7.7 K/uL   Lymphocytes Relative 35 12 - 46 %   Lymphs Abs 2.7 0.7 - 4.0 K/uL   Monocytes Relative 6 3 - 12 %   Monocytes Absolute 0.5 0.1 - 1.0 K/uL   Eosinophils Relative 3 0 - 5 %  Eosinophils Absolute 0.3 0.0 - 0.7 K/uL   Basophils Relative 0 0 - 1 %   Basophils Absolute 0.0 0.0 - 0.1 K/uL  I-stat Chem 8, ED  Result Value Ref Range   Sodium 139 135 - 145 mmol/L   Potassium 3.7 3.5 - 5.1 mmol/L   Chloride 102 101 - 111 mmol/L   BUN 12 6 - 20 mg/dL   Creatinine, Ser 0.70 0.44 - 1.00 mg/dL   Glucose, Bld 86 65 - 99 mg/dL   Calcium, Ion 1.17 1.13 - 1.30 mmol/L   TCO2 25 0 - 100 mmol/L   Hemoglobin 17.3 (H) 12.0 - 15.0 g/dL   HCT 51.0 (H) 36.0 - 46.0 %  I-stat troponin, ED  Result Value Ref Range   Troponin i, poc 0.00 0.00 - 0.08 ng/mL   Comment 3          I-stat troponin, ED  Result Value Ref Range   Troponin i, poc 0.00 0.00 - 0.08 ng/mL   Comment 3           Dg Chest 2 View 12/31/2014   CLINICAL DATA:  Chest pain and neck pain.  EXAM: CHEST  2 VIEW  COMPARISON:  11/10/2012  FINDINGS: Left thorax postsurgical changes are stable.  Cardiomediastinal silhouette is normal. Mediastinal contours appear intact.  There is no evidence of focal airspace consolidation, pleural effusion or pneumothorax.  Osseous structures are without  acute abnormality. Soft tissues are grossly normal.  IMPRESSION: No active cardiopulmonary disease.   Electronically Signed   By: Fidela Salisbury M.D.   On: 12/31/2014 17:51      1655:  Pt answering "I don't know" to most questions. States she is more concerned regarding her chronic right sided neck pain "than my palpitations and my heart." ED RN and I both reiterated ED role regarding addressing complaints (potential cardiac more emergent complaint vs chronic msk pain), but that I would treat her pain while she was in the ED.  Pt verb understanding. Pt ambulated with HR increasing to 97-107, Sats 98% R/A. Pt now reveals to myself and Pharm Tech that she has not been taking her metoprolol as prescribed: she is only taking "1/4 to 1/2 a pill at night."  Pt also states she "doesn't really eat all that much chocolate" which was noted her her Cards office note 06/2013 as a trigger for her palpitations.  Pt revealed this right as Cards Dr. Harl Bowie called me back: we discussed case, including:  HPI, pertinent PM/SHx, VS/PE, dx testing, ED course and treatment, as well as new information pt just told me: agrees pt needs to take her meds as prescribed, f/u in ofc prn. This conversation reiterated with pt who verb understanding. Will give dose of metoprolol here.   2300:  Repeat troponin negative. Pt's VSS, HR has decreased into 80's. Pt states she "feels better" and wants to go home now. Doubt PE as cause for symptoms with low risk [place].  Doubt ACS as cause for symptoms with normal troponin x2 and unchanged EKG from previous after 8+ hours after symptoms. Pt strongly encouraged to take metoprolol BID as previously prescribed and f/u with Cards MD this week. Pt verb understanding and agreeable. Dx and testing d/w pt and family.  Questions answered.  Verb understanding, agreeable to d/c home with outpt f/u.      Francine Graven, DO 01/05/15 0002

## 2014-12-31 NOTE — ED Notes (Signed)
Pt ambulated around ED, hr stayed sinus to sinus tach with rate from 97-107 while walking.  O2 sat 98%.

## 2015-01-01 ENCOUNTER — Other Ambulatory Visit (HOSPITAL_COMMUNITY): Payer: Self-pay | Admitting: Family Medicine

## 2015-01-01 DIAGNOSIS — Z1389 Encounter for screening for other disorder: Secondary | ICD-10-CM

## 2015-01-04 ENCOUNTER — Inpatient Hospital Stay (HOSPITAL_COMMUNITY): Admission: RE | Admit: 2015-01-04 | Payer: 59 | Source: Ambulatory Visit

## 2015-01-09 ENCOUNTER — Ambulatory Visit (HOSPITAL_COMMUNITY)
Admission: RE | Admit: 2015-01-09 | Discharge: 2015-01-09 | Disposition: A | Discharge status: Home / Self Care | Payer: 59 | Source: Ambulatory Visit | Attending: Family Medicine | Admitting: Family Medicine

## 2015-01-09 DIAGNOSIS — Z1389 Encounter for screening for other disorder: Secondary | ICD-10-CM | POA: Diagnosis not present

## 2015-01-09 DIAGNOSIS — R079 Chest pain, unspecified: Secondary | ICD-10-CM | POA: Insufficient documentation

## 2015-01-12 ENCOUNTER — Emergency Department (HOSPITAL_COMMUNITY): Payer: 59

## 2015-01-12 ENCOUNTER — Encounter (HOSPITAL_COMMUNITY): Payer: Self-pay | Admitting: Emergency Medicine

## 2015-01-12 ENCOUNTER — Emergency Department (HOSPITAL_COMMUNITY)
Admission: EM | Admit: 2015-01-12 | Discharge: 2015-01-12 | Disposition: A | Discharge status: Home / Self Care | Payer: 59 | Attending: Emergency Medicine | Admitting: Emergency Medicine

## 2015-01-12 DIAGNOSIS — Z79899 Other long term (current) drug therapy: Secondary | ICD-10-CM | POA: Insufficient documentation

## 2015-01-12 DIAGNOSIS — Y9389 Activity, other specified: Secondary | ICD-10-CM | POA: Diagnosis not present

## 2015-01-12 DIAGNOSIS — Z91040 Latex allergy status: Secondary | ICD-10-CM | POA: Insufficient documentation

## 2015-01-12 DIAGNOSIS — S99912A Unspecified injury of left ankle, initial encounter: Secondary | ICD-10-CM | POA: Diagnosis not present

## 2015-01-12 DIAGNOSIS — X58XXXA Exposure to other specified factors, initial encounter: Secondary | ICD-10-CM | POA: Insufficient documentation

## 2015-01-12 DIAGNOSIS — S199XXA Unspecified injury of neck, initial encounter: Secondary | ICD-10-CM | POA: Insufficient documentation

## 2015-01-12 DIAGNOSIS — G8929 Other chronic pain: Secondary | ICD-10-CM | POA: Diagnosis not present

## 2015-01-12 DIAGNOSIS — S4991XA Unspecified injury of right shoulder and upper arm, initial encounter: Secondary | ICD-10-CM | POA: Insufficient documentation

## 2015-01-12 DIAGNOSIS — S01111A Laceration without foreign body of right eyelid and periocular area, initial encounter: Secondary | ICD-10-CM | POA: Insufficient documentation

## 2015-01-12 DIAGNOSIS — Z859 Personal history of malignant neoplasm, unspecified: Secondary | ICD-10-CM | POA: Diagnosis not present

## 2015-01-12 DIAGNOSIS — W19XXXA Unspecified fall, initial encounter: Secondary | ICD-10-CM

## 2015-01-12 DIAGNOSIS — Y9289 Other specified places as the place of occurrence of the external cause: Secondary | ICD-10-CM | POA: Insufficient documentation

## 2015-01-12 DIAGNOSIS — Y998 Other external cause status: Secondary | ICD-10-CM | POA: Insufficient documentation

## 2015-01-12 DIAGNOSIS — Z23 Encounter for immunization: Secondary | ICD-10-CM | POA: Insufficient documentation

## 2015-01-12 DIAGNOSIS — S8012XA Contusion of left lower leg, initial encounter: Secondary | ICD-10-CM | POA: Diagnosis not present

## 2015-01-12 DIAGNOSIS — S3992XA Unspecified injury of lower back, initial encounter: Secondary | ICD-10-CM | POA: Insufficient documentation

## 2015-01-12 MED ORDER — TETANUS-DIPHTH-ACELL PERTUSSIS 5-2.5-18.5 LF-MCG/0.5 IM SUSP
0.5000 mL | Freq: Once | INTRAMUSCULAR | Status: AC
  Administered 2015-01-12: 0.5 mL via INTRAMUSCULAR
  Filled 2015-01-12: qty 0.5

## 2015-01-12 MED ORDER — LIDOCAINE-EPINEPHRINE (PF) 2 %-1:200000 IJ SOLN: 20.0000 mL | Freq: Once | INTRAMUSCULAR | Status: DC

## 2015-01-12 MED ORDER — HYDROCODONE-ACETAMINOPHEN 5-325 MG PO TABS
1.0000 | ORAL_TABLET | Freq: Once | ORAL | Status: AC
  Administered 2015-01-12: 1 via ORAL
  Filled 2015-01-12: qty 1

## 2015-01-12 MED ORDER — HYDROCODONE-ACETAMINOPHEN 5-325 MG PO TABS: 1.0000 | ORAL_TABLET | Freq: Four times a day (QID) | ORAL | Status: DC | PRN

## 2015-01-12 NOTE — ED Notes (Signed)
Patient transported to CT 

## 2015-01-12 NOTE — Discharge Instructions (Signed)
Usual walker to get around. No weightbearing on your left leg. Do not get the splint wet. Take Tylenol for mild pain or the pain medicine prescribed for bad pain. Don't take Tylenol together with the pain medicine prescribed as a combination to be harmful to your liver. Don't take pain medicine prescribed today together with Xanax or alcohol as the combination can affect your breathing. Call Dr.Aluisio's office in 2 days to arrange to be seen next week. Stitches to come out of your face in 5 days. They can be taken out at your doctor's office or at an urgent care center

## 2015-01-12 NOTE — ED Provider Notes (Signed)
LACERATION REPAIR Performed by: Linus Mako Authorized by: Linus Mako Consent: Verbal consent obtained. Risks and benefits: risks, benefits and alternatives were discussed Consent given by: patient Patient identity confirmed: provided demographic data Prepped and Draped in normal sterile fashion Wound explored  Laceration Location: right lateral eyebrow  Laceration Length: 1 cm  No Foreign Bodies seen or palpated  Anesthesia: local infiltration  Local anesthetic: lidocaine 2% with epinephrine  Anesthetic total: 1  ml  Irrigation method: syringe Amount of cleaning: standard  Skin closure: suture, prolene  Number of sutures: 3  Technique: running suture  Patient tolerance: Patient tolerated the procedure well with no immediate complications.   LACERATION REPAIR Performed by: Linus Mako Authorized by: Linus Mako Consent: Verbal consent obtained. Risks and benefits: risks, benefits and alternatives were discussed Consent given by: patient Patient identity confirmed: provided demographic data Prepped and Draped in normal sterile fashion Wound explored  Laceration Location: right mid eyebrow  Laceration Length: 1 cm  No Foreign Bodies seen or palpated  Anesthesia: local infiltration  Local anesthetic: lidocaine 2% with epinephrine  Anesthetic total: 1 ml  Irrigation method: syringe Amount of cleaning: standard  Skin closure:  Chromic gut and prolene  Number of sutures: 4 sutures  Technique: 1 SQ and 3 running sutures.  Patient tolerance: Patient tolerated the procedure well with no immediate complications.   Pt seen by Dr. Winfred Leeds, please refer to his HPI, ROS, PE and disposition.     Delos Haring, PA-C 01/12/15 1149  Orlie Dakin, MD 01/12/15 [phone]

## 2015-01-12 NOTE — ED Notes (Addendum)
Per EMS: pt from home fell down 4-5 steps today; pt sts right sided facial pain with small laceration and abrasion noted; pt sts left ankle, right shoulder and neck after fall; no LOC, 18g L AC

## 2015-01-12 NOTE — Progress Notes (Signed)
Orthopedic Tech Progress Note Patient Details:  [name] [birth year] [id number]  Ortho Devices Type of Ortho Device: Ace wrap, Post (short leg) splint, Crutches Ortho Device/Splint Interventions: Application   Cammer, Theodoro Parma 01/12/2015, 12:52 PM

## 2015-01-12 NOTE — ED Notes (Signed)
Patient transported to X-ray 

## 2015-01-12 NOTE — ED Notes (Signed)
Ortho tech at bedside 

## 2015-01-12 NOTE — ED Provider Notes (Signed)
CSN: [id number]     Arrival date & time 01/12/15  3300 History   First MD Initiated Contact with Patient 01/12/15 [phone]     Chief Complaint  Patient presents with  . Fall     (Consider location/radiation/quality/duration/timing/severity/associated sxs/prior Treatment) HPI Patient fell down 4 steps immediately prior to coming here. She complains of pain at her face, right shoulder blade low back and left ankle since the event. She also complains of mild neck pain which is chronic and unchanged from baseline. There was no loss of consciousness. No other associated symptoms. Pain is mild to moderate at present. EMS treated patient with long board hard collar and CID Past Medical History  Diagnosis Date  . Tachycardia   . Cancer   . Complication of anesthesia   . PONV (postoperative nausea and vomiting)   . SOB (shortness of breath)   . Palpitations   . PAC (premature atrial contraction)   . Hematuria   . DDD (degenerative disc disease), cervical   . DDD (degenerative disc disease), lumbar   . History of echocardiogram 2015    mild mital insufficiency, otherwise normal   Past Surgical History  Procedure Laterality Date  . Breast lumpectomy    . Laparoscopic appendectomy N/A 11/10/2012    Procedure: APPENDECTOMY LAPAROSCOPIC;  Surgeon: Jamesetta So, MD;  Location: [facility];  Service: General;  Laterality: N/A;   History reviewed. No pertinent family history. Social History  Substance Use Topics  . Smoking status: Never Smoker   . Smokeless tobacco: None  . Alcohol Use: No   OB History    No data available     Review of Systems  Constitutional: Negative.   HENT: Negative.   Respiratory: Negative.   Cardiovascular: Negative.   Gastrointestinal: Negative.   Musculoskeletal: Positive for back pain, arthralgias and neck pain.       Chronic neck pain  Skin: Positive for wound.  Neurological: Negative.   Psychiatric/Behavioral: Negative.   All other systems reviewed and are  negative.     Allergies  Codeine; Latex; and Naprosyn  Home Medications   Prior to Admission medications   Medication Sig Start Date End Date Taking? Authorizing Provider  ALPRAZolam Duanne Moron) 0.5 MG tablet Take 0.5-1 tablets by mouth 2 (two) times daily as needed for anxiety.  12/26/14   Historical Provider, MD  Magnesium 250 MG TABS Take 1 tablet by mouth 2 (two) times daily.    Historical Provider, MD  methocarbamol (ROBAXIN) 500 MG tablet Take 1 tablet (500 mg total) by mouth 2 (two) times daily as needed for muscle spasms. 12/31/14   Francine Graven, DO  metoprolol tartrate (LOPRESSOR) 25 MG tablet Take 12.5 mg by mouth daily.  12/30/14   Historical Provider, MD  omeprazole (PRILOSEC OTC) 20 MG tablet Take 10 mg by mouth daily as needed (for acid reflux).    Historical Provider, MD  omeprazole (PRILOSEC) 20 MG capsule Take 20 mg by mouth daily. 11/29/14   Historical Provider, MD  Vitamin D, Ergocalciferol, (DRISDOL) 50000 UNITS CAPS capsule Take 50,000 Units by mouth every Tuesday.  12/10/14   Historical Provider, MD   BP 133/65 mmHg  Pulse 80  Temp(Src) 98.3 F (36.8 C) (Oral)  Resp 18  Ht 5\' 6"  (1.676 m)  Wt 170 lb (77.111 kg)  BMI 27.45 kg/m2  SpO2 100% Physical Exam  Constitutional: She is oriented to person, place, and time. She appears well-developed and well-nourished. No distress.  Alert Glasgow Coma Score 15  HENT:  There are two 1 cm lacerations at right eyebrow abrasions at right cheek otherwise normocephalic atraumatic.   Eyes: Conjunctivae are normal. Pupils are equal, round, and reactive to light.  Neck: Neck supple. No tracheal deviation present. No thyromegaly present.  Cardiovascular: Normal rate and regular rhythm.   No murmur heard. Pulmonary/Chest: Effort normal and breath sounds normal. She exhibits no tenderness.  Abdominal: Soft. Bowel sounds are normal. She exhibits no distension. There is no tenderness.  Musculoskeletal: Normal range of motion. She  exhibits no edema or tenderness.  Prior spine nontender. Pelvis stable nontender. She has pain at lumbar area when she sits up from a supine position. Left lower extremity 2 cm hematoma with corresponding tenderness at the dorsolateral foot. DP pulse 2+ ankle nontender .Other 3 extremities well contusion abrasion or tenderness neurovascular intact  Neurological: She is alert and oriented to person, place, and time. No cranial nerve deficit. She exhibits normal muscle tone. Coordination normal.  Motor strength 5 over 5 overall cranial nerves II through XII grossly intact Glasgow Coma Score 15  Skin: Skin is warm and dry. No rash noted.  Psychiatric: She has a normal mood and affect.  Nursing note and vitals reviewed.   ED Course  Procedures (including critical care time) Labs Review Labs Reviewed - No data to display  Imaging Review No results found. I have personally reviewed and evaluated these images and lab results as part of my medical decision-making.   EKG Interpretation None     Patient initially declined pain medicine. At 12:35 PM she requested pain medicine. Norco ordered. Left-sided Posterior short-leg splint applied by orthopedic technician. Comfortable for patient. Patient declined crutches saying she has a walker at home X-rays reviewed by me Results for orders placed or performed during the hospital encounter of 12/31/14  CBC with Differential  Result Value Ref Range   WBC 7.9 4.0 - 10.5 K/uL   RBC 4.92 3.87 - 5.11 MIL/uL   Hemoglobin 15.9 (H) 12.0 - 15.0 g/dL   HCT 46.7 (H) 36.0 - 46.0 %   MCV 94.9 78.0 - 100.0 fL   MCH 32.3 26.0 - 34.0 pg   MCHC 34.0 30.0 - 36.0 g/dL   RDW 12.5 11.5 - 15.5 %   Platelets 270 150 - 400 K/uL   Neutrophils Relative % 56 43 - 77 %   Neutro Abs 4.4 1.7 - 7.7 K/uL   Lymphocytes Relative 35 12 - 46 %   Lymphs Abs 2.7 0.7 - 4.0 K/uL   Monocytes Relative 6 3 - 12 %   Monocytes Absolute 0.5 0.1 - 1.0 K/uL   Eosinophils Relative 3 0 -  5 %   Eosinophils Absolute 0.3 0.0 - 0.7 K/uL   Basophils Relative 0 0 - 1 %   Basophils Absolute 0.0 0.0 - 0.1 K/uL  I-stat Chem 8, ED  Result Value Ref Range   Sodium 139 135 - 145 mmol/L   Potassium 3.7 3.5 - 5.1 mmol/L   Chloride 102 101 - 111 mmol/L   BUN 12 6 - 20 mg/dL   Creatinine, Ser 0.70 0.44 - 1.00 mg/dL   Glucose, Bld 86 65 - 99 mg/dL   Calcium, Ion 1.17 1.13 - 1.30 mmol/L   TCO2 25 0 - 100 mmol/L   Hemoglobin 17.3 (H) 12.0 - 15.0 g/dL   HCT 51.0 (H) 36.0 - 46.0 %  I-stat troponin, ED  Result Value Ref Range   Troponin i, poc 0.00 0.00 - 0.08 ng/mL  Comment 3          I-stat troponin, ED  Result Value Ref Range   Troponin i, poc 0.00 0.00 - 0.08 ng/mL   Comment 3           Dg Chest 2 View  01/12/2015   CLINICAL DATA:  Fall down steps  EXAM: CHEST  2 VIEW  COMPARISON:  12/31/2014  FINDINGS: Clips from presumed left breast surgery noted. Heart size is normal. Lungs are hypoaerated with crowding of the bronchovascular markings. Right lower lobe curvilinear atelectasis is likely present. No lobar consolidation. No pleural effusion. No acute osseous finding.  IMPRESSION: Low volume exam with presumed basilar atelectasis but no focal acute finding.   Electronically Signed   By: Conchita Paris M.D.   On: 01/12/2015 10:08   Dg Chest 2 View  12/31/2014   CLINICAL DATA:  Chest pain and neck pain.  EXAM: CHEST  2 VIEW  COMPARISON:  11/10/2012  FINDINGS: Left thorax postsurgical changes are stable.  Cardiomediastinal silhouette is normal. Mediastinal contours appear intact.  There is no evidence of focal airspace consolidation, pleural effusion or pneumothorax.  Osseous structures are without acute abnormality. Soft tissues are grossly normal.  IMPRESSION: No active cardiopulmonary disease.   Electronically Signed   By: Fidela Salisbury M.D.   On: 12/31/2014 17:51   Dg Lumbar Spine Complete  01/12/2015   CLINICAL DATA:  Fall, low back pain  EXAM: LUMBAR SPINE - COMPLETE 4+ VIEW   COMPARISON:  CT 11/11/2011. No prior comparison imaging of the lumbar spine specifically.  FINDINGS: There is no evidence of lumbar spine fracture. Alignment is normal. Intervertebral disc spaces are maintained.  IMPRESSION: Negative.   Electronically Signed   By: Conchita Paris M.D.   On: 01/12/2015 10:09   US Carotid Bilateral  01/09/2015   CLINICAL DATA:  Chest pain.  Screening procedure.  EXAM: BILATERAL CAROTID DUPLEX ULTRASOUND  TECHNIQUE: Pearline Cables scale imaging, color Doppler and duplex ultrasound were performed of bilateral carotid and vertebral arteries in the neck.  COMPARISON:  None.  FINDINGS: Criteria: Quantification of carotid stenosis is based on velocity parameters that correlate the residual internal carotid diameter with NASCET-based stenosis levels, using the diameter of the distal internal carotid lumen as the denominator for stenosis measurement.  The following velocity measurements were obtained:  RIGHT  ICA:  97/29 cm/sec  CCA:  46/27 cm/sec  SYSTOLIC ICA/CCA RATIO:  1.1  DIASTOLIC ICA/CCA RATIO:  1.4  ECA:  176 cm/sec  LEFT  ICA:  82/20 cm/sec  CCA:  03/50 cm/sec  SYSTOLIC ICA/CCA RATIO:  0.9  DIASTOLIC ICA/CCA RATIO:  0.8  ECA:  117 cm/sec  RIGHT CAROTID ARTERY: Mild right carotid bifurcation plaque. No flow limiting stenosis. Waveforms unremarkable.  RIGHT VERTEBRAL ARTERY:  Patent with antegrade flow.  LEFT CAROTID ARTERY: Mild left carotid bifurcation plaque. No flow limiting stenosis. Waveforms unremarkable.  LEFT VERTEBRAL ARTERY:  Patent with antegrade flow.  IMPRESSION: 1. Mild bilateral carotid bifurcation atherosclerotic vascular disease. No flow limiting stenosis. Degree of stenosis less than 50%. 2. Vertebral arteries are patent with antegrade flow.   Electronically Signed   By: Marcello Moores  Register   On: 01/09/2015 13:09   Dg Foot Complete Left  01/12/2015   CLINICAL DATA:  Fall down steps in garage. Landed face first on concrete. Left lateral foot pain and swelling.  EXAM: LEFT  FOOT - COMPLETE 3+ VIEW  COMPARISON:  None  FINDINGS: Small thin crescent-shaped along the lateral aspect of  the distal portion of the talus is noted. Additionally, a second thin crescent-shaped os ossific density is identified within the joint space between the talus and navicular bone. There is no evidence of arthropathy or other focal bone abnormality. Soft tissues are unremarkable.  IMPRESSION: 1. Suspect lateral and dorsal avulsion injuries involving the talus. Correlation with exact sided tenderness is recommended.   Electronically Signed   By: Kerby Moors M.D.   On: 01/12/2015 12:26   Mm Digital Screening Bilateral  12/13/2014   CLINICAL DATA:  Screening. Prior malignant lumpectomy of the left breast in 2002 with adjuvant radiation therapy.  EXAM: DIGITAL SCREENING BILATERAL MAMMOGRAM WITH CAD  COMPARISON:  Previous exam(s).  ACR Breast Density Category b: There are scattered areas of fibroglandular density.  FINDINGS: There are no findings suspicious for malignancy. Post lumpectomy scarring in the upper left breast, posterior depth, unchanged. Images were processed with CAD.  IMPRESSION: No mammographic evidence of malignancy. A result letter of this screening mammogram will be mailed directly to the patient.  RECOMMENDATION: Screening mammogram in one year. (Code:SM-B-01Y)  BI-RADS CATEGORY  2: Benign.   Electronically Signed   By: Evangeline Dakin M.D.   On: 12/13/2014 16:20   Ct Maxillofacial Wo Cm  01/12/2015   CLINICAL DATA:  Fall  EXAM: CT MAXILLOFACIAL WITHOUT CONTRAST  TECHNIQUE: Multidetector CT imaging of the maxillofacial structures was performed. Multiplanar CT image reconstructions were also generated. A small metallic BB was placed on the right temple in order to reliably differentiate right from left.  COMPARISON:  06/21/2009  FINDINGS: There is new of acute fracture or dislocation. A chronic appearing fracture involving the nasal spine is suspected. There are vascular channels in the  anterior maxilla.  There is a soft tissue injury overlying the right orbit.  There is opacification of the right frontal sinus is well is mucosal thickening in the maxillary sinuses and ethmoid air cells. Mastoid air cells are clear.  There is no obvious fracture involving the visualize cervical spine. There are right-sided C3-4 uncovertebral osteophytes.  IMPRESSION: No acute bony injury involving the facial bones. Soft tissue injury overlies the right orbit as described.   Electronically Signed   By: Marybelle Killings M.D.   On: 01/12/2015 10:01    MDM  Cervical spine cleared by Nexus criteria Final diagnoses:  None   clinically patient pain and tenderness which correlates clinically. Closed fracture of talus Plan prescription Norco. Referral Dr.Aluisio, orthopedist. Sutures to come out in 5 days Diagnoses #1 fall #2 contusions multiple sites #3 facial laceration #4 closed fracture of left ankle     Orlie Dakin, MD 01/12/15 1648

## 2015-01-12 NOTE — ED Notes (Signed)
PA at bedside suturing pt. 

## 2015-01-15 ENCOUNTER — Ambulatory Visit (INDEPENDENT_AMBULATORY_CARE_PROVIDER_SITE_OTHER): Payer: 59 | Admitting: Orthopedic Surgery

## 2015-01-15 ENCOUNTER — Encounter: Payer: Self-pay | Admitting: Orthopedic Surgery

## 2015-01-15 VITALS — BP 111/70 | Ht 66.0 in | Wt 176.0 lb

## 2015-01-15 DIAGNOSIS — S93609A Unspecified sprain of unspecified foot, initial encounter: Secondary | ICD-10-CM | POA: Insufficient documentation

## 2015-01-15 DIAGNOSIS — S93409A Sprain of unspecified ligament of unspecified ankle, initial encounter: Secondary | ICD-10-CM | POA: Insufficient documentation

## 2015-01-15 DIAGNOSIS — S93402A Sprain of unspecified ligament of left ankle, initial encounter: Secondary | ICD-10-CM | POA: Diagnosis not present

## 2015-01-15 DIAGNOSIS — S93602A Unspecified sprain of left foot, initial encounter: Secondary | ICD-10-CM | POA: Diagnosis not present

## 2015-01-15 NOTE — Patient Instructions (Signed)
Weightbearing as tolerated in the boot  Advil 800 mg, 3x a day as needed.

## 2015-01-15 NOTE — Progress Notes (Signed)
Patient ID: [name], female   DOB: 10-Jun-1955, 59 y.o.   MRN: [id number]  New patient evaluation   Chief Complaint  Patient presents with  . Foot Injury    ER follow up on left foot fracture, DOI 01-12-15.     [name] is a 59 y.o. female.   HPI Evaluation of left foot after a fall. Pain 3 days after a fall Pain location left foot left ankle Quality dull ache Severity moderate  Circumstances patient fell down some stairs injured her left foot x-rays show dorsal avulsion from near the talus or navicular most likely the talus  System review swelling muscle aches laceration over right eye  Neurologic symptoms normal related to this injury. Review of Systems See hpi  Past Medical History  Diagnosis Date  . Tachycardia   . Cancer   . Complication of anesthesia   . PONV (postoperative nausea and vomiting)   . SOB (shortness of breath)   . Palpitations   . PAC (premature atrial contraction)   . Hematuria   . DDD (degenerative disc disease), cervical   . DDD (degenerative disc disease), lumbar   . History of echocardiogram 2015    mild mital insufficiency, otherwise normal    Past Surgical History  Procedure Laterality Date  . Breast lumpectomy    . Laparoscopic appendectomy N/A 11/10/2012    Procedure: APPENDECTOMY LAPAROSCOPIC;  Surgeon: Jamesetta So, MD;  Location: [facility];  Service: General;  Laterality: N/A;    No family history on file.  Social History Social History  Substance Use Topics  . Smoking status: Never Smoker   . Smokeless tobacco: None  . Alcohol Use: No    Allergies  Allergen Reactions  . Codeine Nausea And Vomiting  . Latex Itching  . Naprosyn [Naproxen] Other (See Comments)    Not known    Current Outpatient Prescriptions  Medication Sig Dispense Refill  . ALPRAZolam (XANAX) 0.5 MG tablet Take 0.5-1 tablets by mouth 2 (two) times daily as needed for anxiety.     Marland Kitchen HYDROcodone-acetaminophen (NORCO) 5-325 MG per tablet  Take 1 tablet by mouth every 6 (six) hours as needed for severe pain. 16 tablet 0  . Magnesium 250 MG TABS Take 1 tablet by mouth 2 (two) times daily.    . methocarbamol (ROBAXIN) 500 MG tablet Take 1 tablet (500 mg total) by mouth 2 (two) times daily as needed for muscle spasms. (Patient not taking: Reported on 01/12/2015) 10 tablet 0  . metoprolol tartrate (LOPRESSOR) 25 MG tablet Take 12.5-25 mg by mouth 2 (two) times daily. 12.5mg  in the morning, 25mg  in the evening    . omeprazole (PRILOSEC) 20 MG capsule Take 20 mg by mouth daily.    . Vitamin D, Ergocalciferol, (DRISDOL) 50000 UNITS CAPS capsule Take 50,000 Units by mouth every Tuesday.   0   No current facility-administered medications for this visit.       Physical Exam Blood pressure 111/70, height 5\' 6"  (1.676 m), weight 176 lb (79.833 kg). Physical Exam The patient is well developed well nourished and well groomed. Orientation to person place and time is normal  Mood is pleasant. Ambulatory status nonweightbearing Left foot is swollen ankle is tender foot is tender at the talus range of motion is painful but we did get 20 and a plantigrade foot ankle joint is stable skin shows some ecchymosis gross motor exam is intact without atrophy muscle tone normal neurologic and vascular exams are normal in  the foot.  Data Reviewed Multiple views of the left foot. I interpret this as avulsion fracture from the talus  No other fractures are seen  Assessment Although this is a foot fracture technically this is basically a ankle and foot sprain   Plan Short Cam Walker weightbearing as tolerated ibuprofen for pain come back 6 weeks no x-ray needed

## 2015-02-26 ENCOUNTER — Encounter: Payer: Self-pay | Admitting: Orthopedic Surgery

## 2015-02-26 ENCOUNTER — Ambulatory Visit (INDEPENDENT_AMBULATORY_CARE_PROVIDER_SITE_OTHER): Payer: 59 | Admitting: Orthopedic Surgery

## 2015-02-26 VITALS — BP 119/70 | Ht 66.0 in | Wt 176.0 lb

## 2015-02-26 DIAGNOSIS — S93602D Unspecified sprain of left foot, subsequent encounter: Secondary | ICD-10-CM | POA: Diagnosis not present

## 2015-02-26 DIAGNOSIS — S93402D Sprain of unspecified ligament of left ankle, subsequent encounter: Secondary | ICD-10-CM | POA: Diagnosis not present

## 2015-02-26 NOTE — Patient Instructions (Signed)
Epsom salt soak twice daily for 30 minutes x 3 weeks  Ok to remove boot

## 2015-02-26 NOTE — Progress Notes (Signed)
Patient ID: [name], female   DOB: 06-09-1955, 59 y.o.   MRN: [id number]  Follow up visit  Chief Complaint  Patient presents with  . Follow-up    6 week recheck on left foot fracture, no xray needed. DOI 01-12-15.    BP 119/70 mmHg  Ht 5\' 6"  (1.676 m)  Wt 176 lb (79.833 kg)  BMI 28.42 kg/m2   HPI Evaluation of left foot after a fall. Pain 3 days after a fall Pain location left foot left ankle Quality dull ache Severity moderate  Circumstances patient fell down some stairs injured her left foot x-rays show dorsal avulsion from near the talus or navicular most likely the talus  System review swelling muscle aches laceration over right eye  Reevaluation of left foot avulsion type fracture over the calcaneus  Patient and Cam Walker 6 weeks no x-ray needed  Still has some mild discomfort over the area in question and some swelling behind the Achilles but overall improved  System review swelling no numbness or tingling  Exam vitals stable as recorded. Mild tenderness over the lateral foot in the area in question. Achilles tendon swelling in front of the tendon in the bursal area but no evidence of rupture or weakness in the tendon ankle joint remains stable neurovascular exam is intact  Impression avulsion fracture left foot  improved with mild persistent pain. Recommend soaking with Epsom salts and she can remove her boot weightbearing as tolerated follow-up as needed

## 2015-05-05 DIAGNOSIS — Z9221 Personal history of antineoplastic chemotherapy: Secondary | ICD-10-CM

## 2015-05-05 HISTORY — DX: Personal history of antineoplastic chemotherapy: Z92.21

## 2015-10-14 ENCOUNTER — Other Ambulatory Visit: Payer: Self-pay | Admitting: Family Medicine

## 2015-10-14 DIAGNOSIS — Z1231 Encounter for screening mammogram for malignant neoplasm of breast: Secondary | ICD-10-CM

## 2015-10-23 DIAGNOSIS — W57XXXA Bitten or stung by nonvenomous insect and other nonvenomous arthropods, initial encounter: Secondary | ICD-10-CM | POA: Diagnosis not present

## 2015-10-23 DIAGNOSIS — Z1389 Encounter for screening for other disorder: Secondary | ICD-10-CM | POA: Diagnosis not present

## 2015-10-23 DIAGNOSIS — E663 Overweight: Secondary | ICD-10-CM | POA: Diagnosis not present

## 2015-10-23 DIAGNOSIS — Z6827 Body mass index (BMI) 27.0-27.9, adult: Secondary | ICD-10-CM | POA: Diagnosis not present

## 2015-10-23 DIAGNOSIS — J029 Acute pharyngitis, unspecified: Secondary | ICD-10-CM | POA: Diagnosis not present

## 2015-12-17 ENCOUNTER — Ambulatory Visit
Admission: RE | Admit: 2015-12-17 | Discharge: 2015-12-17 | Disposition: A | Discharge status: Home / Self Care | Payer: BLUE CROSS/BLUE SHIELD | Source: Ambulatory Visit | Attending: Family Medicine | Admitting: Family Medicine

## 2015-12-17 DIAGNOSIS — Z1231 Encounter for screening mammogram for malignant neoplasm of breast: Secondary | ICD-10-CM

## 2015-12-18 ENCOUNTER — Other Ambulatory Visit: Payer: Self-pay | Admitting: Family Medicine

## 2015-12-18 DIAGNOSIS — R928 Other abnormal and inconclusive findings on diagnostic imaging of breast: Secondary | ICD-10-CM

## 2015-12-24 ENCOUNTER — Other Ambulatory Visit: Payer: Self-pay | Admitting: Family Medicine

## 2015-12-24 ENCOUNTER — Ambulatory Visit
Admission: RE | Admit: 2015-12-24 | Discharge: 2015-12-24 | Disposition: A | Discharge status: Home / Self Care | Payer: BLUE CROSS/BLUE SHIELD | Source: Ambulatory Visit | Attending: Family Medicine | Admitting: Family Medicine

## 2015-12-24 ENCOUNTER — Ambulatory Visit
Admission: RE | Admit: 2015-12-24 | Discharge: 2015-12-24 | Disposition: A | Discharge status: Home / Self Care | Payer: BLUE CROSS/BLUE SHIELD | Source: Ambulatory Visit

## 2015-12-24 DIAGNOSIS — R928 Other abnormal and inconclusive findings on diagnostic imaging of breast: Secondary | ICD-10-CM

## 2015-12-24 DIAGNOSIS — N63 Unspecified lump in unspecified breast: Secondary | ICD-10-CM

## 2015-12-24 DIAGNOSIS — C50412 Malignant neoplasm of upper-outer quadrant of left female breast: Secondary | ICD-10-CM | POA: Diagnosis not present

## 2015-12-24 DIAGNOSIS — N6320 Unspecified lump in the left breast, unspecified quadrant: Secondary | ICD-10-CM

## 2015-12-27 ENCOUNTER — Ambulatory Visit: Payer: Self-pay | Admitting: Surgery

## 2015-12-27 DIAGNOSIS — C50912 Malignant neoplasm of unspecified site of left female breast: Secondary | ICD-10-CM

## 2015-12-27 NOTE — H&P (Signed)
History of Present Illness (Sakira Dahmer K. [place] Dube MD; 12/27/2015 12:29 PM) The patient is a 60 year old female who presents with breast cancer. PCP - Dr. John Golding Referring - Dr. David Williams  Reason: Recurrent left breast cancer  This is a 60-year-old female who is status post lumpectomy and sentinel lymph node biopsy in 2001 for invasive ductal carcinoma and DCIS. Her surgery was performed at [facility] in [place]. She had 0 of 6 sentinel lymph nodes. She was treated with adjuvant radiation and 5 years of tamoxifen. Since that time, the patient has felt "lumpiness" in the upper outer quadrant of her left breast. She has not noticed any significant changes. However she underwent recent routine screening mammogram on 12/17/15. This showed a suspicious finding in the upper outer quadrant left breast. She underwent further workup including ultrasound which showed a 1.3 x 0.9 x 1.4 cm spiculated mass in the upper outer quadrant at 1:30 7 cm from the nipple. There was one visualized axillary lymph node that was normal in appearance. Pathology confirmed invasive ductal carcinoma ER and PR negative Ki-67 70%. The patient presents today for surgical evaluation accompanied by her husband and her son. She denies any pain or nipple drainage from either side. She is postmenopausal.   CLINICAL DATA: Screening.  EXAM: 2D DIGITAL SCREENING BILATERAL MAMMOGRAM WITH CAD AND ADJUNCT TOMO  COMPARISON: Previous exam(s).  ACR Breast Density Category b: There are scattered areas of fibroglandular density.  FINDINGS: In the left breast, a possible mass warrants further evaluation. In the right breast, no findings suspicious for malignancy.  Images were processed with CAD.  IMPRESSION: Further evaluation is suggested for possible mass in the left breast.  RECOMMENDATION: Diagnostic mammogram and possibly ultrasound of the left breast. (Code:[id number])  The patient will be contacted  regarding the findings, and additional imaging will be scheduled.  BI-RADS CATEGORY 0: Incomplete. Need additional imaging evaluation and/or prior mammograms for comparison.   Electronically Signed By: Michelle Collins M.D. On: 12/18/2015 08:05  CLINICAL DATA: 60-year-old female presenting for evaluation of a possible left breast mass identified on screening mammography. The patient has history of a left breast cancer status post left breast lumpectomy in 2001.  EXAM: 2D DIGITAL DIAGNOSTIC UNILATERAL LEFT MAMMOGRAM WITH CAD AND ADJUNCT TOMO  LEFT BREAST ULTRASOUND  COMPARISON: Previous exam(s).  ACR Breast Density Category b: There are scattered areas of fibroglandular density.  FINDINGS: There is a spiculated mass in the upper-outer quadrant of the left breast, superior to the lumpectomy sites spanning approximately 1.3 cm.  Mammographic images were processed with CAD.  Physical exam of the upper-outer quadrant of the left breast demonstrates a firm fixed mass at approximately 1:30.  Ultrasound targeted to the palpable area of concern demonstrates a hypoechoic irregular mass at 1:30, 7 cm from the nipple measuring 1.3 x 0.9 x 1.4 cm. Internal blood flow is documented on color Doppler imaging. Ultrasound of the left axilla demonstrates 1 normal-appearing lymph node.  IMPRESSION: 1. There is a highly suspicious 1.4 cm mass in the left breast at 1:30.  2. No evidence of left axillary lymphadenopathy.  RECOMMENDATION: Ultrasound-guided biopsy is recommended for the left breast mass, and has been scheduled for 12/24/2015 at 3 p.m.  I have discussed the findings and recommendations with the patient. Results were also provided in writing at the conclusion of the visit. If applicable, a reminder letter will be sent to the patient regarding the next appointment.  BI-RADS CATEGORY 5: Highly suggestive of malignancy.     Electronically Signed By: Michelle  Collins M.D. On: 12/24/2015 11:31  CLINICAL DATA: Left breast mass  EXAM: ULTRASOUND GUIDED LEFT BREAST CORE NEEDLE BIOPSY  COMPARISON: Previous exam(s).  FINDINGS: I met with the patient and we discussed the procedure of ultrasound-guided biopsy, including benefits and alternatives. We discussed the high likelihood of a successful procedure. We discussed the risks of the procedure, including infection, bleeding, tissue injury, clip migration, and inadequate sampling. Informed written consent was given. The usual time-out protocol was performed immediately prior to the procedure.  Using sterile technique and 1% Lidocaine as local anesthetic, under direct ultrasound visualization, a 12 gauge spring-loaded device was used to perform biopsy of a left breast mass using a lateral approach. At the conclusion of the procedure a tissue marker clip was deployed into the biopsy cavity. Follow up 2 view mammogram was performed and dictated separately.  IMPRESSION: Ultrasound guided biopsy of a left breast mass. No apparent complications.  Electronically Signed: By: David Williams III M.D On: 12/24/2015 15:30  CLINICAL DATA: Evaluate clip placement  EXAM: DIAGNOSTIC LEFT MAMMOGRAM POST ULTRASOUND BIOPSY  COMPARISON: Previous exam(s).  FINDINGS: Mammographic images were obtained following ultrasound guided biopsy of a left breast mass. The coil shaped clip is within the biopsied mass  IMPRESSION: Appropriate placement of biopsy clip.  Final Assessment: Post Procedure Mammograms for Marker Placement   Electronically Signed By: David Williams III M.D On: 12/24/2015 15:55  ADDENDUM REPORT: 12/25/2015 10:27  ADDENDUM: Pathology revealed grade III invasive ductal carcinoma in the left breast. This was found to be concordant by Dr. David Williams. Pathology results were discussed with the patient by telephone. The patient reported doing well after the biopsy with  tenderness at the site. Post biopsy instructions and care were reviewed and questions were answered. The patient was encouraged to call [facility] for any additional concerns. The patient requested surgical consultation in [place] and an appointment has been made with Dr. Colandra Ohanian at [facility] on December 27, 2015.  Pathology results reported by Leigh Kuhnly RN, BSN on 12/25/2015.   Electronically Signed By: David Williams III M.D On: 12/25/2015 10:27   Other Problems (April Staton, CMA; 12/27/2015 11:26 AM) Anxiety Disorder Arthritis Breast Cancer Gastroesophageal Reflux Disease High blood pressure Lump In Breast Thyroid Disease  Past Surgical History (April Staton, CMA; 12/27/2015 11:26 AM) Appendectomy Breast Biopsy Left. multiple Breast Mass; Local Excision Left. Colon Polyp Removal - Colonoscopy  Diagnostic Studies History (April Staton, CMA; 12/27/2015 11:26 AM) Colonoscopy >10 years ago Mammogram within last year  Allergies (April Staton, CMA; 12/27/2015 11:27 AM) Codeine Sulfate *ANALGESICS - OPIOID* Aleve *ANALGESICS - ANTI-INFLAMMATORY*  Medication History (April Staton, CMA; 12/27/2015 11:28 AM) Metoprolol Tartrate (25MG Tablet, Oral) Active. ALPRAZolam (0.5MG Tablet, Oral) Active. Omeprazole (20MG Capsule DR, Oral) Active. Vitamin D (Cholecalciferol) (Oral) Specific dose unknown - Active.  Social History (April Staton, CMA; 12/27/2015 11:26 AM) Caffeine use Coffee. No alcohol use No drug use Tobacco use Never smoker.  Family History (April Staton, CMA; 12/27/2015 11:26 AM) Alcohol Abuse Brother, Father. Breast Cancer Family Members In General. Cancer Family Members In General. Colon Cancer Mother. Diabetes Mellitus Father. Hypertension Father.  Pregnancy / Birth History (April Staton, CMA; 12/27/2015 11:26 AM) Age at menarche 13 years. Age of menopause  46-50 Gravida 2 Maternal age 15-20 Para 2     Review of Systems (April Staton CMA; 12/27/2015 11:26 AM) General Present- Fatigue and Night Sweats. Not Present- Appetite Loss, Chills, Fever, Weight Gain   and Weight Loss. Skin Not Present- Change in Wart/Mole, Dryness, Hives, Jaundice, New Lesions, Non-Healing Wounds, Rash and Ulcer. HEENT Present- Seasonal Allergies, Visual Disturbances and Wears glasses/contact lenses. Not Present- Earache, Hearing Loss, Hoarseness, Nose Bleed, Oral Ulcers, Ringing in the Ears, Sinus Pain, Sore Throat and Yellow Eyes. Respiratory Not Present- Bloody sputum, Chronic Cough, Difficulty Breathing, Snoring and Wheezing. Breast Present- Breast Mass and Breast Pain. Not Present- Nipple Discharge and Skin Changes. Cardiovascular Present- Leg Cramps, Palpitations and Rapid Heart Rate. Not Present- Chest Pain, Difficulty Breathing Lying Down, Shortness of Breath and Swelling of Extremities. Gastrointestinal Present- Indigestion. Not Present- Abdominal Pain, Bloating, Bloody Stool, Change in Bowel Habits, Chronic diarrhea, Constipation, Difficulty Swallowing, Excessive gas, Gets full quickly at meals, Hemorrhoids, Nausea, Rectal Pain and Vomiting. Female Genitourinary Present- Frequency. Not Present- Nocturia, Painful Urination, Pelvic Pain and Urgency. Musculoskeletal Present- Joint Pain and Joint Stiffness. Not Present- Back Pain, Muscle Pain, Muscle Weakness and Swelling of Extremities. Neurological Not Present- Decreased Memory, Fainting, Headaches, Numbness, Seizures, Tingling, Tremor, Trouble walking and Weakness. Psychiatric Present- Anxiety. Not Present- Bipolar, Change in Sleep Pattern, Depression, Fearful and Frequent crying. Endocrine Present- Hair Changes and Hot flashes. Not Present- Cold Intolerance, Excessive Hunger, Heat Intolerance and New Diabetes. Hematology Not Present- Blood Thinners, Easy Bruising, Excessive bleeding, Gland problems, HIV and  Persistent Infections.  Vitals (April Staton CMA; 12/27/2015 11:29 AM) 12/27/2015 11:28 AM Weight: 167.25 lb Height: 66in Height was reported by patient. Body Surface Area: 1.85 m Body Mass Index: 26.99 kg/m  Temp.: 98.5F(Oral)  Pulse: 84 (Regular)  P.OX: 97% (Room air) BP: 130/80 (Sitting, Left Arm, Standard)      Physical Exam (Trinika Cortese K. Khamauri Bauernfeind MD; 12/27/2015 12:30 PM)  The physical exam findings are as follows: Note:WDWN in NAD HEENT: EOMI, sclera anicteric Neck: No masses, no thyromegaly Breasts: asymmetric - left smaller than right Right breast shows no palpable masses or axillary lymphadenopathy. No nipple retraction or discharge Left breast shows a healed incision above the nipple. There is decreased volume in the left breast. The upper outer quadrant shows some indistinct nodularity. There is some palpable nodularity in the upper outer quadrant that correlates to the mammogram findings. There is significant bruising in this area from the biopsy. No axillary lymphadenopathy. The axillary incision is well-healed Lungs: CTA bilaterally; normal respiratory effort CV: Regular rate and rhythm; no murmurs Abd: +bowel sounds, soft, non-tender, no masses Ext: Well-perfused; no edema Skin: Warm, dry; no sign of jaundice    Assessment & Plan (Sontee Desena K. Dayzee Trower MD; 12/27/2015 12:33 PM)  RECURRENT BREAST CANCER, LEFT (C50.912)  Current Plans Pt Education - [facility] Breast Cancer Information Given - [facility] "Breast Journey" Package Schedule for Surgery - Left mastectomy with sentinel lymph node biopsy. The surgical procedure has been discussed with the patient. Potential risks, benefits, alternative treatments, and expected outcomes have been explained. All of the patient's questions at this time have been answered. The likelihood of reaching the patient's treatment goal is good. The patient understand the proposed surgical procedure and wishes to proceed. Note:I spent about 45  minutes with the patient and her family members discussing her disease as well as surgical options. Previously, she had breast conserving therapy that included lumpectomy, sentinel lymph node biopsy, and radiation as well as adjuvant hormonal treatment. Unfortunately, with this recurrence, she will need to have a left mastectomy as she should not undergo any further radiation. We discussed the options for mastectomy including traditional mastectomy with sentinel lymph node biopsy, mastectomy with immediate reconstruction   by plastic surgery, or even nipple sparing mastectomy with immediate reconstruction by plastic surgery. The patient has no interest in having reconstruction. She already requires a prosthetic insert in her left bra because of the decreased size compared to the right after her last surgery. She plans to just have a mastectomy and will wear a full size prosthetic on the left.  We will refer her to the cancer Center at [place] to Dr. Shannon Penland. Based on the findings of the sentinel lymph node, if it is determined that the patient needs to have chemotherapy, we will place a port on an outpatient basis.  We discussed the procedure for a left mastectomy with sentinel lymph node biopsy. The surgical procedure has been discussed with the patient. Potential risks, benefits, alternative treatments, and expected outcomes have been explained. All of the patient's questions at this time have been answered. The likelihood of reaching the patient's treatment goal is good. The patient understand the proposed surgical procedure and wishes to proceed.  Adja Ruff K. Reyli Schroth, MD, FACS [facility]  General[facility] Trauma Surgery  12/27/2015 12:34 PM  

## 2016-01-01 ENCOUNTER — Telehealth: Payer: Self-pay | Admitting: *Deleted

## 2016-01-01 NOTE — Telephone Encounter (Signed)
Received notification that pt prefers to go to [facility] d/t it's closer to her home. Informed new pt coordinator that her appt will need to be prior to her sx on 01/07/16 and r/s d/t it was currently scheduled for 9/14. Pt r/s to 01/02/16 at 2:20pm. Called pt with new appt date and time. Gave instructions. Encourage pt to call with needs.

## 2016-01-02 ENCOUNTER — Other Ambulatory Visit (HOSPITAL_COMMUNITY): Payer: Self-pay | Admitting: *Deleted

## 2016-01-02 ENCOUNTER — Encounter (HOSPITAL_COMMUNITY): Payer: Self-pay | Admitting: Hematology & Oncology

## 2016-01-02 ENCOUNTER — Encounter (HOSPITAL_COMMUNITY): Payer: BLUE CROSS/BLUE SHIELD | Attending: Hematology & Oncology | Admitting: Hematology & Oncology

## 2016-01-02 VITALS — BP 131/66 | HR 85 | Temp 98.9°F | Resp 16 | Ht 66.5 in | Wt 167.7 lb

## 2016-01-02 DIAGNOSIS — Z853 Personal history of malignant neoplasm of breast: Secondary | ICD-10-CM

## 2016-01-02 DIAGNOSIS — C50412 Malignant neoplasm of upper-outer quadrant of left female breast: Secondary | ICD-10-CM

## 2016-01-02 DIAGNOSIS — C50912 Malignant neoplasm of unspecified site of left female breast: Secondary | ICD-10-CM

## 2016-01-02 NOTE — Pre-Procedure Instructions (Signed)
    [name]  01/02/2016      [id number] - [place], [place] [id number] [place] [id number] Phone: [phone] Fax: [phone]    Your procedure is scheduled on Sept 5.    Tuesday     Report to [place] at [phone].  Call this number if you have problems the morning of surgery:  [phone]   Remember:  Do not eat food or drink liquids after midnight.   Take these medicines the morning of surgery with A SIP OF WATER Tylenol if needed, alprazolam (Xanax) if needed, Metoprolol tartrate (Lopressor)  7 days prior to your surgery stop taking Asprin, BC's, Goody's, Aleve, Ibuprofen, Advil. Motrin, Herbal medications, Fish oil   Do not wear jewelry, make-up or nail polish.  Do not wear lotions, powders, or perfumes, or deoderant.  Do not shave 48 hours prior to surgery.     Do not bring valuables to the hospital.  [facility] is not responsible for any belongings or valuables.  Contacts, dentures or bridgework may not be worn into surgery.  Leave your suitcase in the car.  After surgery it may be brought to your room.  For patients admitted to the hospital, discharge time will be determined by your treatment team.  Patients discharged the day of surgery will not be allowed to drive home.   Please read over the following fact sheets that you were given. Surgical Site Infection Prevention

## 2016-01-02 NOTE — Progress Notes (Signed)
[facility] NOTE  Patient Care Team: Sharilyn Sites, MD as PCP - General (Family Medicine)  CHIEF COMPLAINTS/PURPOSE OF CONSULTATION:  L breast carcinoma ER- PR- HER 2 +  HISTORY OF PRESENTING ILLNESS:  [name] 60 y.o. female is here because of newly diagnosed ER- PR- HER 2 + L breast cancer.  Patient has had breast cancer before when she was 60 years old (16 years ago). Patient doesn't remember if she had any genetic testing in the past.  Her recent breast cancer was found through a mammogram. Cancer is on left breast, an area that she says has always felt weird since her past lumpectomy. She denies ever feeling a palpable abnormality. On available reports, palpable mass is noted. She has seen Dr. Georgette Dover in consultation. She has been presented in breast conference, chemotherapy has been recommended.   Patient did radiation for first time in [place]. Dr. Nelva Bush preformed surgery. Patient took tamoxifen 5 years and did well on that.  Patient is upset about diagnosis. She worries about the chemotherapy, losing her hair, and a mastectomy but says her husband and family are supportive. She discussed reconstructive surgery with Dr. Georgette Dover. She is currently not interested. She also worried about treatment with her heart condition (irregular heartbeat) and is currently seeing a Engineer, technical sales in [place].  Patient also mentioned abdominal pain. She has never had a colonoscopy before.   MEDICAL HISTORY:  Past Medical History:  Diagnosis Date  . Anxiety   . Breast cancer ([place])   . Complication of anesthesia   . DDD (degenerative disc disease), cervical   . DDD (degenerative disc disease), lumbar   . Fibromyalgia   . Hematuria   . History of echocardiogram 2015   mild mital insufficiency, otherwise normal  . Hypertension   . Leaky heart valve   . Multiple thyroid nodules   . PAC (premature atrial contraction)   . Palpitations   . PONV (postoperative  nausea and vomiting)   . SOB (shortness of breath)   . Tachycardia     SURGICAL HISTORY: Past Surgical History:  Procedure Laterality Date  . BREAST LUMPECTOMY    . LAPAROSCOPIC APPENDECTOMY N/A 11/10/2012   Procedure: APPENDECTOMY LAPAROSCOPIC;  Surgeon: Jamesetta So, MD;  Location: [facility];  Service: General;  Laterality: N/A;    SOCIAL HISTORY: Social History   Social History  . Marital status: Married    Spouse name: N/A  . Number of children: N/A  . Years of education: N/A   Occupational History  . Not on file.   Social History Main Topics  . Smoking status: Never Smoker  . Smokeless tobacco: Never Used  . Alcohol use No  . Drug use: No  . Sexual activity: Yes     Comment: married   Other Topics Concern  . Not on file   Social History Narrative  . No narrative on file   She is married with two children and a fourth grandchild is on the way. She was born in [place] and has 7 siblings.   Patient enjoys staying home and taking care of husband's business. She has 2 outside pets. She has never smoked. Although husband doesn't express emotion much, he is very supportive.  FAMILY HISTORY: Family History  Problem Relation Age of Onset  . Cancer Mother     colon  . Diabetes Father   . Diabetes Sister   . Alcohol abuse Brother   . Cancer Brother 43  lung cancer  . Cancer Brother     penile   Her mom passed away at 41 from colon cancer. Her father was a diabetic and is also deceased. Two brothers are also deceased (1 was a smoker and died of lung cancer and the other was an alcoholic).   ALLERGIES:  is allergic to codeine; latex; and naprosyn [naproxen].  MEDICATIONS:  Current Outpatient Prescriptions  Medication Sig Dispense Refill  . acetaminophen (TYLENOL) 500 MG tablet Take 500 mg by mouth every 6 (six) hours as needed for mild pain.    Marland Kitchen ALPRAZolam (XANAX) 0.5 MG tablet Take 0.5 tablets by mouth daily as needed for anxiety.     Marland Kitchen antiseptic oral  rinse (BIOTENE) LIQD 15 mLs by Mouth Rinse route as needed for dry mouth.    . Cholecalciferol (VITAMIN D3) 1000 units CAPS Take 1,000 Units by mouth every morning.    . metoprolol tartrate (LOPRESSOR) 25 MG tablet Take 12.5-25 mg by mouth 2 (two) times daily. 25 mg in the morning. 12.5 mg in the evening,    . omeprazole (PRILOSEC) 20 MG capsule Take 20 mg by mouth every evening.     Vladimir Faster Glycol-Propyl Glycol (SYSTANE) 0.4-0.3 % SOLN Apply 1 drop to eye at bedtime.     No current facility-administered medications for this visit.     Review of Systems  Constitutional: Negative.   HENT: Negative.   Eyes: Negative.   Respiratory: Negative.   Cardiovascular: Negative.   Gastrointestinal: Positive for abdominal pain.       Abdominal pain; has never had colonoscopy  Genitourinary: Negative.   Musculoskeletal: Negative.        Left foot pain from injury a year ago  Skin: Positive for rash.       Has rash near left breast; associated with tape used in biopsy.  Neurological: Negative.   Endo/Heme/Allergies: Negative.   Psychiatric/Behavioral: Negative.   All other systems reviewed and are negative. 14 point ROS was done and is otherwise as detailed above or in HPI   PHYSICAL EXAMINATION: ECOG PERFORMANCE STATUS: 0 - Asymptomatic  Vitals:   01/02/16 1432  BP: 131/66  Pulse: 85  Resp: 16  Temp: 98.9 F (37.2 C)   Filed Weights   01/02/16 1432  Weight: 167 lb 11.2 oz (76.1 kg)    Physical Exam  Constitutional: She is oriented to person, place, and time and well-developed, well-nourished, and in no distress.  HENT:  Head: Normocephalic and atraumatic.  Nose: Nose normal.  Mouth/Throat: Oropharynx is clear and moist. No oropharyngeal exudate.  Eyes: Conjunctivae and EOM are normal. Pupils are equal, round, and reactive to light. Right eye exhibits no discharge. Left eye exhibits no discharge. No scleral icterus.  Neck: Normal range of motion. Neck supple. No tracheal  deviation present. No thyromegaly present.  Cardiovascular: Normal rate, regular rhythm and normal heart sounds.  Exam reveals no gallop and no friction rub.   No murmur heard. Pulmonary/Chest: Effort normal and breath sounds normal. She has no wheezes. She has no rales.  Abdominal: Soft. Bowel sounds are normal. She exhibits no distension and no mass. There is no tenderness. There is no rebound and no guarding.  Musculoskeletal: Normal range of motion. She exhibits no edema.  Lymphadenopathy:    She has no cervical adenopathy.  Neurological: She is alert and oriented to person, place, and time. She has normal reflexes. No cranial nerve deficit. Gait normal. Coordination normal.  Skin: Skin is warm and dry.  Rash noted.  Rash on left breast from prior tape  Psychiatric: Mood, memory, affect and judgment normal.  Nursing note and vitals reviewed. 14 point review of systems was performed and is negative except as detailed under history of present illness and above   LABORATORY DATA:  I have reviewed the data as listed Lab Results  Component Value Date   WBC 7.9 12/31/2014   HGB 17.3 (H) 12/31/2014   HCT 51.0 (H) 12/31/2014   MCV 94.9 12/31/2014   PLT 270 12/31/2014   CMP     Component Value Date/Time   NA 139 12/31/2014 1657   K 3.7 12/31/2014 1657   CL 102 12/31/2014 1657   CO2 28 11/09/2012 1833   GLUCOSE 86 12/31/2014 1657   BUN 12 12/31/2014 1657   CREATININE 0.70 12/31/2014 1657   CALCIUM 10.1 11/09/2012 1833   PROT 8.4 (H) 11/09/2012 2355   ALBUMIN 4.5 11/09/2012 2355   AST 34 11/09/2012 2355   ALT 26 11/09/2012 2355   ALKPHOS 78 11/09/2012 2355   BILITOT 0.3 11/09/2012 2355   GFRNONAA >90 11/10/2012 0910   GFRAA >90 11/10/2012 0910     RADIOGRAPHIC STUDIES: I have personally reviewed the radiological images as listed and agreed with the findings in the report.  Addendum   ADDENDUM REPORT: 12/25/2015 10:27  ADDENDUM: Pathology revealed grade III invasive  ductal carcinoma in the left breast. This was found to be concordant by Dr. Dorise Bullion. Pathology results were discussed with the patient by telephone. The patient reported doing well after the biopsy with tenderness at the site. Post biopsy instructions and care were reviewed and questions were answered. The patient was encouraged to call [facility] for any additional concerns. The patient requested surgical consultation in [place] and an appointment has been made with Dr. Donnie [facility]a at [facility] on December 27, 2015.  Pathology results reported by Susa Raring RN, BSN on 12/25/2015.   Electronically Signed   By: Dorise Bullion III M.D   On: 12/25/2015 10:27   Addended by York Ram, MD on 12/25/2015 10:32 AM    Study Result   CLINICAL DATA:  Left breast mass  EXAM: ULTRASOUND GUIDED LEFT BREAST CORE NEEDLE BIOPSY  COMPARISON:  Previous exam(s).  FINDINGS: I met with the patient and we discussed the procedure of ultrasound-guided biopsy, including benefits and alternatives. We discussed the high likelihood of a successful procedure. We discussed the risks of the procedure, including infection, bleeding, tissue injury, clip migration, and inadequate sampling. Informed written consent was given. The usual time-out protocol was performed immediately prior to the procedure.  Using sterile technique and 1% Lidocaine as local anesthetic, under direct ultrasound visualization, a 12 gauge spring-loaded device was used to perform biopsy of a left breast mass using a lateral approach. At the conclusion of the procedure a tissue marker clip was deployed into the biopsy cavity. Follow up 2 view mammogram was performed and dictated separately.  IMPRESSION: Ultrasound guided biopsy of a left breast mass. No apparent complications.  Electronically Signed: By: Dorise Bullion III M.D On: 12/24/2015  15:30     PATHOLOGY:     ASSESSMENT & PLAN:  L breast cancer upper outer quadrant ER- PR- HER 2 + Prior history L breast cancer ER+, lumpectomy, XRT, Tamoxifen  We spent a significant amount of time discussing different types of breast cancer. We focused on the ER receptor, PR receptor and HER 2 receptor. Spoke  with patient about her type of breast cancer and treatment options. We discussed that chemotherapy is recommended and reviewed tumor board recommendations and the NCCN guidelines. We spoke about her breast cancer this time is different than previously diagnosed breast cancer.  Spoke with patient about support groups offered here for cancer patients.   Ordered genetics referral.  Patient was given copy of pathology report. She was also given teaching materials.   She met with Anderson Malta, our patient, navigator today.  Patient will undergo left mastectomy with sentinel lymph node biopsy on 01/07/2016 with Dr. Georgette Dover  Patient will follow up about two weeks post surgery. At this next visit I will order abdominal CT to investigate abdominal pain she has been having.   She will undergo cardiology evaluation prior to therapy, she is extremely concerned about this.   She will also be scheduled for chemotherapy teaching.   All questions were answered. The patient knows to call the clinic with any problems, questions or concerns.  This document serves as a record of services personally performed by Ancil Linsey, MD. It was created on her behalf by Elmyra Ricks, a trained medical scribe. The creation of this record is based on the scribe's personal observations and the provider's statements to them. This document has been checked and approved by the attending provider.  I have reviewed the above documentation for accuracy and completeness and I agree with the above.  This note was electronically signed.    Molli Hazard, MD  01/02/2016 3:28 PM

## 2016-01-02 NOTE — Patient Instructions (Addendum)
[facility] at [facility] Discharge Instructions  RECOMMENDATIONS MADE BY THE CONSULTANT AND ANY TEST RESULTS WILL BE SENT TO YOUR REFERRING PHYSICIAN.  You saw Dr. Whitney Muse today. Follow up 2 weeks from Sept. 5.  Thank you for choosing [facility] at [facility] to provide your oncology and hematology care.  To afford each patient quality time with our provider, please arrive at least 15 minutes before your scheduled appointment time.   Beginning January 23rd 2017 lab work for the [facility] will be done in the  Main lab at [facility] on 1st floor. If you have a lab appointment with the [facility] please come in thru the  Main Entrance and check in at the main information desk  You need to re-schedule your appointment should you arrive 10 or more minutes late.  We strive to give you quality time with our providers, and arriving late affects you and other patients whose appointments are after yours.  Also, if you no show three or more times for appointments you may be dismissed from the clinic at the providers discretion.     Again, thank you for choosing [facility].  Our hope is that these requests will decrease the amount of time that you wait before being seen by our physicians.       _____________________________________________________________  Should you have questions after your visit to [facility], please contact our office at [phone] between the hours of 8:30 a.m. and 4:30 p.m.  Voicemails left after 4:30 p.m. will not be returned until the following business day.  For prescription refill requests, have your pharmacy contact our office.         Resources For Cancer Patients and their Caregivers ? American Cancer Society: Can assist with transportation, wigs, general needs, runs Look Good Feel Better.        410-232-7212 ? Cancer Care: Provides financial assistance, online support groups,  medication/co-pay assistance.  1-800-813-HOPE 5307217407) ? [place] Assists [place] cancer patients and their families through emotional , educational and financial support.  725 099 9804 ? [place] DSS Where to apply for food stamps, Medicaid and utility assistance. 2097885679 ? [facility]: Transportation to medical appointments. 7605829787 ? Social Security Administration: May apply for disability if have a Stage IV cancer. 304-826-8665 (352)624-9105 ? [employer], Disability and Transit Services: Assists with nutrition, care and transit needs. [place] Support Programs: @10RELATIVEDAYS @ > Cancer Support Group  2nd Tuesday of the month 1pm-2pm, Journey Room  > Creative Journey  3rd Tuesday of the month 1130am-1pm, Journey Room  > Look Good Feel Better  1st Wednesday of the month 10am-12 noon, Journey Room (Call [facility] to register [phone])

## 2016-01-03 ENCOUNTER — Encounter (HOSPITAL_COMMUNITY): Payer: Self-pay

## 2016-01-03 ENCOUNTER — Ambulatory Visit: Payer: Self-pay | Admitting: Surgery

## 2016-01-03 ENCOUNTER — Other Ambulatory Visit (HOSPITAL_COMMUNITY): Payer: Self-pay | Admitting: *Deleted

## 2016-01-03 ENCOUNTER — Encounter (HOSPITAL_COMMUNITY)
Admission: RE | Admit: 2016-01-03 | Discharge: 2016-01-03 | Disposition: A | Discharge status: Home / Self Care | Payer: BLUE CROSS/BLUE SHIELD | Source: Ambulatory Visit | Attending: Surgery | Admitting: Surgery

## 2016-01-03 DIAGNOSIS — Z01810 Encounter for preprocedural cardiovascular examination: Secondary | ICD-10-CM | POA: Insufficient documentation

## 2016-01-03 DIAGNOSIS — Z853 Personal history of malignant neoplasm of breast: Secondary | ICD-10-CM

## 2016-01-03 DIAGNOSIS — Z01812 Encounter for preprocedural laboratory examination: Secondary | ICD-10-CM | POA: Insufficient documentation

## 2016-01-03 HISTORY — DX: Major depressive disorder, single episode, unspecified: F32.9

## 2016-01-03 HISTORY — DX: Personal history of malignant neoplasm of breast: Z85.3

## 2016-01-03 HISTORY — DX: Depression, unspecified: F32.A

## 2016-01-03 HISTORY — DX: Gastro-esophageal reflux disease without esophagitis: K21.9

## 2016-01-03 LAB — BASIC METABOLIC PANEL
Anion gap: 9 (ref 5–15)
BUN: 10 mg/dL (ref 6–20)
CALCIUM: 9.7 mg/dL (ref 8.9–10.3)
CO2: 24 mmol/L (ref 22–32)
CREATININE: 0.7 mg/dL (ref 0.44–1.00)
Chloride: 105 mmol/L (ref 101–111)
GFR calc non Af Amer: >60 mL/min (ref >60)
GLUCOSE: 93 mg/dL (ref 65–99)
Potassium: 3.9 mmol/L (ref 3.5–5.1)
Sodium: 138 mmol/L (ref 135–145)

## 2016-01-03 LAB — CBC
HCT: 45.6 % (ref 36.0–46.0)
Hemoglobin: 14.9 g/dL (ref 12.0–15.0)
MCH: 31.4 pg (ref 26.0–34.0)
MCHC: 32.7 g/dL (ref 30.0–36.0)
MCV: 96 fL (ref 78.0–100.0)
PLATELETS: 279 10*3/uL (ref 150–400)
RBC: 4.75 MIL/uL (ref 3.87–5.11)
RDW: 12.7 % (ref 11.5–15.5)
WBC: 6.8 10*3/uL (ref 4.0–10.5)

## 2016-01-03 NOTE — Progress Notes (Signed)
Notified Dr. Vonna Kotyk office regarding wording on consent, OR schedule states that pt. Is also to have port-a-cath placement but this is not on the consent.

## 2016-01-03 NOTE — Progress Notes (Signed)
Cardiology OV 06-13-2013 in Epic ,Results of Echo in office note  But unable to locate report.

## 2016-01-06 NOTE — Anesthesia Preprocedure Evaluation (Addendum)
Anesthesia Evaluation  Patient identified by MRN, date of birth, ID band Patient awake    Reviewed: Allergy & Precautions, NPO status , Patient's Chart, lab work & pertinent test results, reviewed documented beta blocker date and time   History of Anesthesia Complications (+) PONV and history of anesthetic complications  Airway Mallampati: II  TM Distance: >3 FB Neck ROM: Full    Dental  (+) Teeth Intact, Dental Advisory Given, Caps,    Pulmonary neg pulmonary ROS,    Pulmonary exam normal breath sounds clear to auscultation       Cardiovascular hypertension, Pt. on medications and Pt. on home beta blockers Normal cardiovascular exam+ dysrhythmias + Valvular Problems/Murmurs MR  Rhythm:Regular Rate:Normal + Systolic murmurs    Neuro/Psych PSYCHIATRIC DISORDERS Anxiety Depression negative neurological ROS     GI/Hepatic Neg liver ROS, GERD  Medicated,  Endo/Other  negative endocrine ROS  Renal/GU negative Renal ROS     Musculoskeletal  (+) Arthritis , Osteoarthritis,  Fibromyalgia -  Abdominal   Peds  Hematology negative hematology ROS (+)   Anesthesia Other Findings Day of surgery medications reviewed with the patient.  Breast cancer  Reproductive/Obstetrics                            Anesthesia Physical Anesthesia Plan  ASA: II  Anesthesia Plan: General and Regional   Post-op Pain Management:  Regional for Post-op pain   Induction: Intravenous  Airway Management Planned: Oral ETT  Additional Equipment:   Intra-op Plan:   Post-operative Plan: Extubation in OR  Informed Consent: I have reviewed the patients History and Physical, chart, labs and discussed the procedure including the risks, benefits and alternatives for the proposed anesthesia with the patient or authorized representative who has indicated his/her understanding and acceptance.   Dental advisory given  Plan  Discussed with: CRNA  Anesthesia Plan Comments: (Risks/benefits of general anesthesia discussed with patient including risk of damage to teeth, lips, gum, and tongue, nausea/vomiting, allergic reactions to medications, and the possibility of heart attack, stroke and death.  All patient questions answered.  Patient wishes to proceed.  Discuss LEFT PECS BLOCK.)        Anesthesia Quick Evaluation

## 2016-01-07 ENCOUNTER — Encounter (HOSPITAL_COMMUNITY)
Admission: RE | Disposition: A | Discharge status: Home / Self Care | Payer: Self-pay | Source: Ambulatory Visit | Attending: Surgery

## 2016-01-07 ENCOUNTER — Ambulatory Visit (HOSPITAL_COMMUNITY)
Admission: RE | Admit: 2016-01-07 | Discharge: 2016-01-08 | Disposition: A | Discharge status: Home / Self Care | Payer: BLUE CROSS/BLUE SHIELD | Source: Ambulatory Visit | Attending: Surgery | Admitting: Surgery

## 2016-01-07 ENCOUNTER — Encounter (HOSPITAL_COMMUNITY): Payer: Self-pay | Admitting: General Practice

## 2016-01-07 ENCOUNTER — Encounter (HOSPITAL_COMMUNITY)
Admission: RE | Admit: 2016-01-07 | Discharge: 2016-01-07 | Disposition: A | Discharge status: Home / Self Care | Payer: BLUE CROSS/BLUE SHIELD | Source: Ambulatory Visit | Attending: Surgery | Admitting: Surgery

## 2016-01-07 ENCOUNTER — Ambulatory Visit (HOSPITAL_COMMUNITY): Payer: BLUE CROSS/BLUE SHIELD | Admitting: Hematology & Oncology

## 2016-01-07 ENCOUNTER — Ambulatory Visit (HOSPITAL_COMMUNITY): Payer: BLUE CROSS/BLUE SHIELD

## 2016-01-07 ENCOUNTER — Ambulatory Visit (HOSPITAL_COMMUNITY): Payer: BLUE CROSS/BLUE SHIELD | Admitting: Anesthesiology

## 2016-01-07 DIAGNOSIS — F419 Anxiety disorder, unspecified: Secondary | ICD-10-CM | POA: Insufficient documentation

## 2016-01-07 DIAGNOSIS — I10 Essential (primary) hypertension: Secondary | ICD-10-CM | POA: Insufficient documentation

## 2016-01-07 DIAGNOSIS — Z171 Estrogen receptor negative status [ER-]: Secondary | ICD-10-CM | POA: Diagnosis not present

## 2016-01-07 DIAGNOSIS — Z452 Encounter for adjustment and management of vascular access device: Secondary | ICD-10-CM

## 2016-01-07 DIAGNOSIS — M797 Fibromyalgia: Secondary | ICD-10-CM | POA: Diagnosis not present

## 2016-01-07 DIAGNOSIS — C50412 Malignant neoplasm of upper-outer quadrant of left female breast: Secondary | ICD-10-CM | POA: Diagnosis not present

## 2016-01-07 DIAGNOSIS — Z923 Personal history of irradiation: Secondary | ICD-10-CM | POA: Insufficient documentation

## 2016-01-07 DIAGNOSIS — Z79899 Other long term (current) drug therapy: Secondary | ICD-10-CM | POA: Insufficient documentation

## 2016-01-07 DIAGNOSIS — Z853 Personal history of malignant neoplasm of breast: Secondary | ICD-10-CM | POA: Diagnosis not present

## 2016-01-07 DIAGNOSIS — C50912 Malignant neoplasm of unspecified site of left female breast: Secondary | ICD-10-CM

## 2016-01-07 DIAGNOSIS — K219 Gastro-esophageal reflux disease without esophagitis: Secondary | ICD-10-CM | POA: Insufficient documentation

## 2016-01-07 DIAGNOSIS — G8918 Other acute postprocedural pain: Secondary | ICD-10-CM | POA: Diagnosis not present

## 2016-01-07 HISTORY — PX: MASTECTOMY: SHX3

## 2016-01-07 HISTORY — PX: MASTECTOMY W/ SENTINEL NODE BIOPSY: SHX2001

## 2016-01-07 HISTORY — PX: PORTACATH PLACEMENT: SHX2246

## 2016-01-07 LAB — CREATININE, SERUM
Creatinine, Ser: 0.65 mg/dL (ref 0.44–1.00)
GFR calc Af Amer: >60 mL/min (ref >60)
GFR calc non Af Amer: >60 mL/min (ref >60)

## 2016-01-07 LAB — CBC
HEMATOCRIT: 45.5 % (ref 36.0–46.0)
HEMOGLOBIN: 14.9 g/dL (ref 12.0–15.0)
MCH: 31.6 pg (ref 26.0–34.0)
MCHC: 32.7 g/dL (ref 30.0–36.0)
MCV: 96.6 fL (ref 78.0–100.0)
Platelets: 247 10*3/uL (ref 150–400)
RBC: 4.71 MIL/uL (ref 3.87–5.11)
RDW: 12.7 % (ref 11.5–15.5)
WBC: 8.9 10*3/uL (ref 4.0–10.5)

## 2016-01-07 SURGERY — MASTECTOMY WITH SENTINEL LYMPH NODE BIOPSY
Anesthesia: Regional | Site: Neck | Laterality: Right

## 2016-01-07 MED ORDER — PHENYLEPHRINE 40 MCG/ML (10ML) SYRINGE FOR IV PUSH (FOR BLOOD PRESSURE SUPPORT)
PREFILLED_SYRINGE | INTRAVENOUS | Status: AC
  Filled 2016-01-07: qty 10

## 2016-01-07 MED ORDER — BUPIVACAINE-EPINEPHRINE (PF) 0.5% -1:200000 IJ SOLN
INTRAMUSCULAR | Status: DC | PRN
  Administered 2016-01-07: 30 mL via PERINEURAL

## 2016-01-07 MED ORDER — ONDANSETRON HCL 4 MG/2ML IJ SOLN
INTRAMUSCULAR | Status: AC
  Filled 2016-01-07: qty 6

## 2016-01-07 MED ORDER — DOCUSATE SODIUM 100 MG PO CAPS
100.0000 mg | ORAL_CAPSULE | Freq: Two times a day (BID) | ORAL | Status: DC
  Administered 2016-01-07 – 2016-01-08 (×3): 100 mg via ORAL
  Filled 2016-01-07 (×3): qty 1

## 2016-01-07 MED ORDER — ACETAMINOPHEN 650 MG RE SUPP: 650.0000 mg | Freq: Four times a day (QID) | RECTAL | Status: DC | PRN

## 2016-01-07 MED ORDER — ROCURONIUM BROMIDE 10 MG/ML (PF) SYRINGE
PREFILLED_SYRINGE | INTRAVENOUS | Status: AC
  Filled 2016-01-07: qty 20

## 2016-01-07 MED ORDER — MIDAZOLAM HCL 2 MG/2ML IJ SOLN
INTRAMUSCULAR | Status: AC
  Filled 2016-01-07: qty 2

## 2016-01-07 MED ORDER — ONDANSETRON HCL 4 MG/2ML IJ SOLN
INTRAMUSCULAR | Status: DC | PRN
  Administered 2016-01-07: 4 mg via INTRAVENOUS

## 2016-01-07 MED ORDER — CHLORHEXIDINE GLUCONATE CLOTH 2 % EX PADS: 6.0000 | MEDICATED_PAD | Freq: Once | CUTANEOUS | Status: DC

## 2016-01-07 MED ORDER — CEFAZOLIN SODIUM-DEXTROSE 2-4 GM/100ML-% IV SOLN
2.0000 g | Freq: Three times a day (TID) | INTRAVENOUS | Status: AC
  Administered 2016-01-07: 2 g via INTRAVENOUS
  Filled 2016-01-07: qty 100

## 2016-01-07 MED ORDER — BUPIVACAINE-EPINEPHRINE 0.25% -1:200000 IJ SOLN
INTRAMUSCULAR | Status: DC | PRN
  Administered 2016-01-07: 1 mL

## 2016-01-07 MED ORDER — PANTOPRAZOLE SODIUM 40 MG PO TBEC
40.0000 mg | DELAYED_RELEASE_TABLET | Freq: Every day | ORAL | Status: DC
  Administered 2016-01-07: 40 mg via ORAL
  Filled 2016-01-07 (×2): qty 1

## 2016-01-07 MED ORDER — ACETAMINOPHEN 325 MG PO TABS: 650.0000 mg | ORAL_TABLET | Freq: Four times a day (QID) | ORAL | Status: DC | PRN

## 2016-01-07 MED ORDER — PROPOFOL 10 MG/ML IV BOLUS
INTRAVENOUS | Status: DC | PRN
  Administered 2016-01-07: 20 mg via INTRAVENOUS
  Administered 2016-01-07: 180 mg via INTRAVENOUS

## 2016-01-07 MED ORDER — EPHEDRINE SULFATE 50 MG/ML IJ SOLN
INTRAMUSCULAR | Status: DC | PRN
  Administered 2016-01-07: 5 mg via INTRAVENOUS

## 2016-01-07 MED ORDER — POTASSIUM CHLORIDE IN NACL 20-0.9 MEQ/L-% IV SOLN
INTRAVENOUS | Status: DC
  Administered 2016-01-07: 14:00:00 via INTRAVENOUS
  Filled 2016-01-07: qty 1000

## 2016-01-07 MED ORDER — 0.9 % SODIUM CHLORIDE (POUR BTL) OPTIME
TOPICAL | Status: DC | PRN
  Administered 2016-01-07: 1000 mL

## 2016-01-07 MED ORDER — FENTANYL CITRATE (PF) 250 MCG/5ML IJ SOLN
INTRAMUSCULAR | Status: DC | PRN
  Administered 2016-01-07: 50 ug via INTRAVENOUS
  Administered 2016-01-07: 100 ug via INTRAVENOUS
  Administered 2016-01-07: 50 ug via INTRAVENOUS

## 2016-01-07 MED ORDER — DIPHENHYDRAMINE HCL 50 MG/ML IJ SOLN: 12.5000 mg | Freq: Four times a day (QID) | INTRAMUSCULAR | Status: DC | PRN

## 2016-01-07 MED ORDER — DIPHENHYDRAMINE HCL 12.5 MG/5ML PO ELIX: 12.5000 mg | ORAL_SOLUTION | Freq: Four times a day (QID) | ORAL | Status: DC | PRN

## 2016-01-07 MED ORDER — ORAL CARE MOUTH RINSE: 15.0000 mL | OROMUCOSAL | Status: DC | PRN

## 2016-01-07 MED ORDER — LIDOCAINE HCL (CARDIAC) 20 MG/ML IV SOLN
INTRAVENOUS | Status: DC | PRN
  Administered 2016-01-07: 100 mg via INTRATRACHEAL

## 2016-01-07 MED ORDER — METHOCARBAMOL 500 MG PO TABS: 500.0000 mg | ORAL_TABLET | Freq: Four times a day (QID) | ORAL | Status: DC | PRN

## 2016-01-07 MED ORDER — PHENYLEPHRINE HCL 10 MG/ML IJ SOLN
INTRAMUSCULAR | Status: DC | PRN
  Administered 2016-01-07 (×5): 80 ug via INTRAVENOUS

## 2016-01-07 MED ORDER — PROMETHAZINE HCL 25 MG/ML IJ SOLN: 6.2500 mg | INTRAMUSCULAR | Status: DC | PRN

## 2016-01-07 MED ORDER — CEFAZOLIN SODIUM-DEXTROSE 2-4 GM/100ML-% IV SOLN
2.0000 g | INTRAVENOUS | Status: AC
  Administered 2016-01-07: 2 g via INTRAVENOUS

## 2016-01-07 MED ORDER — FENTANYL CITRATE (PF) 100 MCG/2ML IJ SOLN
INTRAMUSCULAR | Status: AC
  Filled 2016-01-07: qty 2

## 2016-01-07 MED ORDER — SCOPOLAMINE 1 MG/3DAYS TD PT72
MEDICATED_PATCH | TRANSDERMAL | Status: DC | PRN
  Administered 2016-01-07: 1 via TRANSDERMAL

## 2016-01-07 MED ORDER — GABAPENTIN 300 MG PO CAPS
ORAL_CAPSULE | ORAL | Status: AC
  Filled 2016-01-07: qty 1

## 2016-01-07 MED ORDER — LACTATED RINGERS IV SOLN
INTRAVENOUS | Status: DC | PRN
  Administered 2016-01-07 (×2): via INTRAVENOUS

## 2016-01-07 MED ORDER — HEPARIN SOD (PORK) LOCK FLUSH 100 UNIT/ML IV SOLN
INTRAVENOUS | Status: DC | PRN
  Administered 2016-01-07: 500 [IU] via INTRAVENOUS

## 2016-01-07 MED ORDER — MORPHINE SULFATE (PF) 2 MG/ML IV SOLN: 2.0000 mg | INTRAVENOUS | Status: DC | PRN

## 2016-01-07 MED ORDER — SCOPOLAMINE 1 MG/3DAYS TD PT72
MEDICATED_PATCH | TRANSDERMAL | Status: AC
  Filled 2016-01-07: qty 1

## 2016-01-07 MED ORDER — LIDOCAINE 2% (20 MG/ML) 5 ML SYRINGE
INTRAMUSCULAR | Status: AC
  Filled 2016-01-07: qty 10

## 2016-01-07 MED ORDER — PROPOFOL 10 MG/ML IV BOLUS
INTRAVENOUS | Status: AC
  Filled 2016-01-07: qty 20

## 2016-01-07 MED ORDER — MIDAZOLAM HCL 5 MG/5ML IJ SOLN
INTRAMUSCULAR | Status: DC | PRN
  Administered 2016-01-07: 2 mg via INTRAVENOUS

## 2016-01-07 MED ORDER — POLYVINYL ALCOHOL 1.4 % OP SOLN
1.0000 [drp] | Freq: Every day | OPHTHALMIC | Status: DC
  Filled 2016-01-07: qty 15

## 2016-01-07 MED ORDER — ALPRAZOLAM 0.25 MG PO TABS: 0.2500 mg | ORAL_TABLET | Freq: Every day | ORAL | Status: DC | PRN

## 2016-01-07 MED ORDER — BUPIVACAINE-EPINEPHRINE (PF) 0.25% -1:200000 IJ SOLN
INTRAMUSCULAR | Status: AC
  Filled 2016-01-07: qty 30

## 2016-01-07 MED ORDER — SODIUM CHLORIDE 0.9 % IV SOLN
INTRAVENOUS | Status: DC | PRN
  Administered 2016-01-07: 500 mL

## 2016-01-07 MED ORDER — SUGAMMADEX SODIUM 200 MG/2ML IV SOLN
INTRAVENOUS | Status: AC
  Filled 2016-01-07: qty 2

## 2016-01-07 MED ORDER — HEPARIN SOD (PORK) LOCK FLUSH 100 UNIT/ML IV SOLN
INTRAVENOUS | Status: AC
  Filled 2016-01-07: qty 5

## 2016-01-07 MED ORDER — FENTANYL CITRATE (PF) 100 MCG/2ML IJ SOLN
25.0000 ug | INTRAMUSCULAR | Status: DC | PRN
  Administered 2016-01-07 (×4): 25 ug via INTRAVENOUS

## 2016-01-07 MED ORDER — ACETAMINOPHEN 500 MG PO TABS
1000.0000 mg | ORAL_TABLET | ORAL | Status: AC
  Administered 2016-01-07: 1000 mg via ORAL

## 2016-01-07 MED ORDER — SODIUM CHLORIDE 0.9 % IJ SOLN
INTRAVENOUS | Status: DC | PRN
  Administered 2016-01-07: 5 mL

## 2016-01-07 MED ORDER — ENOXAPARIN SODIUM 40 MG/0.4ML ~~LOC~~ SOLN
40.0000 mg | SUBCUTANEOUS | Status: DC
  Administered 2016-01-08: 40 mg via SUBCUTANEOUS
  Filled 2016-01-07: qty 0.4

## 2016-01-07 MED ORDER — ACETAMINOPHEN 500 MG PO TABS
ORAL_TABLET | ORAL | Status: AC
  Filled 2016-01-07: qty 2

## 2016-01-07 MED ORDER — TECHNETIUM TC 99M SULFUR COLLOID FILTERED
1.0000 | Freq: Once | INTRAVENOUS | Status: AC | PRN
  Administered 2016-01-07: 1 via INTRADERMAL

## 2016-01-07 MED ORDER — DEXAMETHASONE SODIUM PHOSPHATE 10 MG/ML IJ SOLN
INTRAMUSCULAR | Status: DC | PRN
  Administered 2016-01-07: 5 mg via INTRAVENOUS

## 2016-01-07 MED ORDER — FENTANYL CITRATE (PF) 100 MCG/2ML IJ SOLN
INTRAMUSCULAR | Status: AC
Start: 2016-01-07 — End: 2016-01-07
  Filled 2016-01-07: qty 4

## 2016-01-07 MED ORDER — METHYLENE BLUE 0.5 % INJ SOLN
INTRAVENOUS | Status: AC
  Filled 2016-01-07: qty 10

## 2016-01-07 MED ORDER — SODIUM CHLORIDE 0.9 % IJ SOLN
INTRAMUSCULAR | Status: AC
  Filled 2016-01-07: qty 10

## 2016-01-07 MED ORDER — ROCURONIUM BROMIDE 100 MG/10ML IV SOLN
INTRAVENOUS | Status: DC | PRN
  Administered 2016-01-07: 50 mg via INTRAVENOUS

## 2016-01-07 MED ORDER — METOPROLOL TARTRATE 12.5 MG HALF TABLET
12.5000 mg | ORAL_TABLET | Freq: Every day | ORAL | Status: DC
  Administered 2016-01-07: 12.5 mg via ORAL
  Filled 2016-01-07: qty 1

## 2016-01-07 MED ORDER — METOPROLOL TARTRATE 12.5 MG HALF TABLET
25.0000 mg | ORAL_TABLET | Freq: Every day | ORAL | Status: DC
  Filled 2016-01-07: qty 2

## 2016-01-07 MED ORDER — OXYCODONE HCL 5 MG PO TABS
5.0000 mg | ORAL_TABLET | ORAL | Status: DC | PRN
  Administered 2016-01-07: 10 mg via ORAL
  Administered 2016-01-07: 5 mg via ORAL
  Administered 2016-01-08: 10 mg via ORAL
  Filled 2016-01-07: qty 2
  Filled 2016-01-07: qty 1
  Filled 2016-01-07: qty 2

## 2016-01-07 MED ORDER — GABAPENTIN 300 MG PO CAPS
300.0000 mg | ORAL_CAPSULE | ORAL | Status: AC
  Administered 2016-01-07: 300 mg via ORAL

## 2016-01-07 MED ORDER — CEFAZOLIN SODIUM-DEXTROSE 2-4 GM/100ML-% IV SOLN
INTRAVENOUS | Status: AC
  Filled 2016-01-07: qty 100

## 2016-01-07 MED ORDER — ONDANSETRON HCL 4 MG/2ML IJ SOLN: 4.0000 mg | Freq: Four times a day (QID) | INTRAMUSCULAR | Status: DC | PRN

## 2016-01-07 MED ORDER — ONDANSETRON 4 MG PO TBDP: 4.0000 mg | ORAL_TABLET | Freq: Four times a day (QID) | ORAL | Status: DC | PRN

## 2016-01-07 SURGICAL SUPPLY — 83 items
APL SKNCLS STERI-STRIP NONHPOA (GAUZE/BANDAGES/DRESSINGS) ×2
APPLIER CLIP 9.375 MED OPEN (MISCELLANEOUS) ×4
APR CLP MED 9.3 20 MLT OPN (MISCELLANEOUS) ×2
BAG DECANTER FOR FLEXI CONT (MISCELLANEOUS) ×4 IMPLANT
BENZOIN TINCTURE PRP APPL 2/3 (GAUZE/BANDAGES/DRESSINGS) ×4 IMPLANT
BINDER BREAST LRG (GAUZE/BANDAGES/DRESSINGS) ×2 IMPLANT
BINDER BREAST XLRG (GAUZE/BANDAGES/DRESSINGS) IMPLANT
BLADE SURG 11 STRL SS (BLADE) ×4 IMPLANT
BLADE SURG 15 STRL LF DISP TIS (BLADE) ×2 IMPLANT
BLADE SURG 15 STRL SS (BLADE)
CANISTER SUCTION 2500CC (MISCELLANEOUS) ×4 IMPLANT
CHLORAPREP W/TINT 10.5 ML (MISCELLANEOUS) ×2 IMPLANT
CHLORAPREP W/TINT 26ML (MISCELLANEOUS) ×4 IMPLANT
CLIP APPLIE 9.375 MED OPEN (MISCELLANEOUS) ×2 IMPLANT
CLOSURE WOUND 1/2 X4 (GAUZE/BANDAGES/DRESSINGS) ×1
CONT SPEC 4OZ CLIKSEAL STRL BL (MISCELLANEOUS) ×2 IMPLANT
COVER PROBE W GEL 5X96 (DRAPES) ×4 IMPLANT
COVER SURGICAL LIGHT HANDLE (MISCELLANEOUS) ×4 IMPLANT
COVER TRANSDUCER ULTRASND GEL (DRAPE) ×2 IMPLANT
CRADLE DONUT ADULT HEAD (MISCELLANEOUS) ×4 IMPLANT
DRAIN CHANNEL 19F RND (DRAIN) ×4 IMPLANT
DRAPE C-ARM 42X72 X-RAY (DRAPES) ×4 IMPLANT
DRAPE CHEST BREAST 15X10 FENES (DRAPES) ×2 IMPLANT
DRAPE LAPAROSCOPIC ABDOMINAL (DRAPES) ×4 IMPLANT
DRAPE UTILITY XL STRL (DRAPES) ×2 IMPLANT
DRSG PAD ABDOMINAL 8X10 ST (GAUZE/BANDAGES/DRESSINGS) ×8 IMPLANT
DRSG TEGADERM 4X4.75 (GAUZE/BANDAGES/DRESSINGS) ×4 IMPLANT
ELECT BLADE 4.0 EZ CLEAN MEGAD (MISCELLANEOUS) ×4
ELECT CAUTERY BLADE 6.4 (BLADE) ×4 IMPLANT
ELECT REM PT RETURN 9FT ADLT (ELECTROSURGICAL) ×4
ELECTRODE BLDE 4.0 EZ CLN MEGD (MISCELLANEOUS) ×2 IMPLANT
ELECTRODE REM PT RTRN 9FT ADLT (ELECTROSURGICAL) ×2 IMPLANT
EVACUATOR SILICONE 100CC (DRAIN) ×4 IMPLANT
GAUZE SPONGE 2X2 8PLY STRL LF (GAUZE/BANDAGES/DRESSINGS) ×2 IMPLANT
GAUZE SPONGE 4X4 12PLY STRL (GAUZE/BANDAGES/DRESSINGS) ×4 IMPLANT
GAUZE SPONGE 4X4 16PLY XRAY LF (GAUZE/BANDAGES/DRESSINGS) ×4 IMPLANT
GEL ULTRASOUND 20GR AQUASONIC (MISCELLANEOUS) IMPLANT
GLOVE BIOGEL PI IND STRL 7.5 (GLOVE) ×2 IMPLANT
GLOVE BIOGEL PI IND STRL 8.5 (GLOVE) IMPLANT
GLOVE BIOGEL PI INDICATOR 7.5 (GLOVE) ×8
GLOVE BIOGEL PI INDICATOR 8.5 (GLOVE) ×4
GLOVE SURG SS PI 6.0 STRL IVOR (GLOVE) ×4 IMPLANT
GLOVE SURG SS PI 7.5 STRL IVOR (GLOVE) ×2 IMPLANT
GLOVE SURG SS PI 8.0 STRL IVOR (GLOVE) ×4 IMPLANT
GOWN STRL REUS W/ TWL LRG LVL3 (GOWN DISPOSABLE) ×4 IMPLANT
GOWN STRL REUS W/ TWL XL LVL3 (GOWN DISPOSABLE) IMPLANT
GOWN STRL REUS W/TWL LRG LVL3 (GOWN DISPOSABLE) ×24
GOWN STRL REUS W/TWL XL LVL3 (GOWN DISPOSABLE) ×8
INTRODUCER COOK 11FR (CATHETERS) IMPLANT
KIT BASIN OR (CUSTOM PROCEDURE TRAY) ×4 IMPLANT
KIT PORT POWER 8FR ISP CVUE (Catheter) ×2 IMPLANT
KIT ROOM TURNOVER OR (KITS) ×4 IMPLANT
NDL 18GX1X1/2 (RX/OR ONLY) (NEEDLE) ×2 IMPLANT
NDL FILTER BLUNT 18X1 1/2 (NEEDLE) IMPLANT
NDL HYPO 25GX1X1/2 BEV (NEEDLE) ×2 IMPLANT
NEEDLE 18GX1X1/2 (RX/OR ONLY) (NEEDLE) ×4 IMPLANT
NEEDLE FILTER BLUNT 18X 1/2SAF (NEEDLE) ×2
NEEDLE FILTER BLUNT 18X1 1/2 (NEEDLE) ×2 IMPLANT
NEEDLE HYPO 25GX1X1/2 BEV (NEEDLE) ×8 IMPLANT
NS IRRIG 1000ML POUR BTL (IV SOLUTION) ×4 IMPLANT
PACK GENERAL/GYN (CUSTOM PROCEDURE TRAY) ×4 IMPLANT
PACK SURGICAL SETUP 50X90 (CUSTOM PROCEDURE TRAY) ×4 IMPLANT
PAD ARMBOARD 7.5X6 YLW CONV (MISCELLANEOUS) ×6 IMPLANT
PENCIL BUTTON HOLSTER BLD 10FT (ELECTRODE) ×2 IMPLANT
SET INTRODUCER 12FR PACEMAKER (SHEATH) IMPLANT
SET SHEATH INTRODUCER 10FR (MISCELLANEOUS) IMPLANT
SHEATH COOK PEEL AWAY SET 9F (SHEATH) IMPLANT
SPECIMEN JAR LARGE (MISCELLANEOUS) ×2 IMPLANT
SPECIMEN JAR X LARGE (MISCELLANEOUS) ×2 IMPLANT
SPONGE GAUZE 2X2 STER 10/PKG (GAUZE/BANDAGES/DRESSINGS) ×2
STAPLER VISISTAT 35W (STAPLE) ×4 IMPLANT
STRIP CLOSURE SKIN 1/2X4 (GAUZE/BANDAGES/DRESSINGS) ×3 IMPLANT
SUT ETHILON 2 0 FS 18 (SUTURE) ×4 IMPLANT
SUT MNCRL AB 4-0 PS2 18 (SUTURE) ×4 IMPLANT
SUT PROLENE 2 0 SH DA (SUTURE) ×4 IMPLANT
SUT VIC AB 3-0 SH 18 (SUTURE) ×6 IMPLANT
SUT VIC AB 3-0 SH 27 (SUTURE)
SUT VIC AB 3-0 SH 27X BRD (SUTURE) ×2 IMPLANT
SYR 20ML ECCENTRIC (SYRINGE) ×8 IMPLANT
SYR 5ML LUER SLIP (SYRINGE) ×4 IMPLANT
SYR CONTROL 10ML LL (SYRINGE) ×6 IMPLANT
TOWEL OR 17X24 6PK STRL BLUE (TOWEL DISPOSABLE) ×2 IMPLANT
TOWEL OR 17X26 10 PK STRL BLUE (TOWEL DISPOSABLE) ×4 IMPLANT

## 2016-01-07 NOTE — H&P (View-Only) (Signed)
History of Present Illness [name]. Katora Fini MD; 12/27/2015 12:29 PM) The patient is a 60 year old female who presents with breast cancer. PCP - Dr. Sharilyn Sites Referring - Dr. Dorise Bullion  Reason: Recurrent left breast cancer  This is a 60 year old female who is status post lumpectomy and sentinel lymph node biopsy in 2001 for invasive ductal carcinoma and DCIS. Her surgery was performed at [facility] in [place]. She had 0 of 6 sentinel lymph nodes. She was treated with adjuvant radiation and 5 years of tamoxifen. Since that time, the patient has felt "lumpiness" in the upper outer quadrant of her left breast. She has not noticed any significant changes. However she underwent recent routine screening mammogram on 12/17/15. This showed a suspicious finding in the upper outer quadrant left breast. She underwent further workup including ultrasound which showed a 1.3 x 0.9 x 1.4 cm spiculated mass in the upper outer quadrant at 1:30 7 cm from the nipple. There was one visualized axillary lymph node that was normal in appearance. Pathology confirmed invasive ductal carcinoma ER and PR negative Ki-67 70%. The patient presents today for surgical evaluation accompanied by her husband and her son. She denies any pain or nipple drainage from either side. She is postmenopausal.   CLINICAL DATA: Screening.  EXAM: 2D DIGITAL SCREENING BILATERAL MAMMOGRAM WITH CAD AND ADJUNCT TOMO  COMPARISON: Previous exam(s).  ACR Breast Density Category b: There are scattered areas of fibroglandular density.  FINDINGS: In the left breast, a possible mass warrants further evaluation. In the right breast, no findings suspicious for malignancy.  Images were processed with CAD.  IMPRESSION: Further evaluation is suggested for possible mass in the left breast.  RECOMMENDATION: Diagnostic mammogram and possibly ultrasound of the left breast. (Code:[id number])  The patient will be contacted  regarding the findings, and additional imaging will be scheduled.  BI-RADS CATEGORY 0: Incomplete. Need additional imaging evaluation and/or prior mammograms for comparison.   Electronically Signed By: Ammie Ferrier M.D. On: 12/18/2015 08:05  CLINICAL DATA: 60 year old female presenting for evaluation of a possible left breast mass identified on screening mammography. The patient has history of a left breast cancer status post left breast lumpectomy in 2001.  EXAM: 2D DIGITAL DIAGNOSTIC UNILATERAL LEFT MAMMOGRAM WITH CAD AND ADJUNCT TOMO  LEFT BREAST ULTRASOUND  COMPARISON: Previous exam(s).  ACR Breast Density Category b: There are scattered areas of fibroglandular density.  FINDINGS: There is a spiculated mass in the upper-outer quadrant of the left breast, superior to the lumpectomy sites spanning approximately 1.3 cm.  Mammographic images were processed with CAD.  Physical exam of the upper-outer quadrant of the left breast demonstrates a firm fixed mass at approximately 1:30.  Ultrasound targeted to the palpable area of concern demonstrates a hypoechoic irregular mass at 1:30, 7 cm from the nipple measuring 1.3 x 0.9 x 1.4 cm. Internal blood flow is documented on color Doppler imaging. Ultrasound of the left axilla demonstrates 1 normal-appearing lymph node.  IMPRESSION: 1. There is a highly suspicious 1.4 cm mass in the left breast at 1:30.  2. No evidence of left axillary lymphadenopathy.  RECOMMENDATION: Ultrasound-guided biopsy is recommended for the left breast mass, and has been scheduled for 12/24/2015 at 3 p.m.  I have discussed the findings and recommendations with the patient. Results were also provided in writing at the conclusion of the visit. If applicable, a reminder letter will be sent to the patient regarding the next appointment.  BI-RADS CATEGORY 5: Highly suggestive of malignancy.  Electronically Signed By: Ammie Ferrier M.D. On: 12/24/2015 11:31  CLINICAL DATA: Left breast mass  EXAM: ULTRASOUND GUIDED LEFT BREAST CORE NEEDLE BIOPSY  COMPARISON: Previous exam(s).  FINDINGS: I met with the patient and we discussed the procedure of ultrasound-guided biopsy, including benefits and alternatives. We discussed the high likelihood of a successful procedure. We discussed the risks of the procedure, including infection, bleeding, tissue injury, clip migration, and inadequate sampling. Informed written consent was given. The usual time-out protocol was performed immediately prior to the procedure.  Using sterile technique and 1% Lidocaine as local anesthetic, under direct ultrasound visualization, a 12 gauge spring-loaded device was used to perform biopsy of a left breast mass using a lateral approach. At the conclusion of the procedure a tissue marker clip was deployed into the biopsy cavity. Follow up 2 view mammogram was performed and dictated separately.  IMPRESSION: Ultrasound guided biopsy of a left breast mass. No apparent complications.  Electronically Signed: By: Dorise Bullion III M.D On: 12/24/2015 15:30  CLINICAL DATA: Evaluate clip placement  EXAM: DIAGNOSTIC LEFT MAMMOGRAM POST ULTRASOUND BIOPSY  COMPARISON: Previous exam(s).  FINDINGS: Mammographic images were obtained following ultrasound guided biopsy of a left breast mass. The coil shaped clip is within the biopsied mass  IMPRESSION: Appropriate placement of biopsy clip.  Final Assessment: Post Procedure Mammograms for Marker Placement   Electronically Signed By: Dorise Bullion III M.D On: 12/24/2015 15:55  ADDENDUM REPORT: 12/25/2015 10:27  ADDENDUM: Pathology revealed grade III invasive ductal carcinoma in the left breast. This was found to be concordant by Dr. Dorise Bullion. Pathology results were discussed with the patient by telephone. The patient reported doing well after the biopsy with  tenderness at the site. Post biopsy instructions and care were reviewed and questions were answered. The patient was encouraged to call [facility] for any additional concerns. The patient requested surgical consultation in [place] and an appointment has been made with Dr. Donnie Mesa at [facility] on December 27, 2015.  Pathology results reported by Susa Raring RN, BSN on 12/25/2015.   Electronically Signed By: Dorise Bullion III M.D On: 12/25/2015 10:27   Other Problems (April Staton, CMA; 12/27/2015 11:26 AM) Anxiety Disorder Arthritis Breast Cancer Gastroesophageal Reflux Disease High blood pressure Lump In Breast Thyroid Disease  Past Surgical History (April Staton, CMA; 12/27/2015 11:26 AM) Appendectomy Breast Biopsy Left. multiple Breast Mass; Local Excision Left. Colon Polyp Removal - Colonoscopy  Diagnostic Studies History (April Staton, CMA; 12/27/2015 11:26 AM) Colonoscopy >10 years ago Mammogram within last year  Allergies (April Staton, CMA; 12/27/2015 11:27 AM) Codeine Sulfate *ANALGESICS - OPIOID* Aleve *ANALGESICS - ANTI-INFLAMMATORY*  Medication History (April Staton, CMA; 12/27/2015 11:28 AM) Metoprolol Tartrate (25MG Tablet, Oral) Active. ALPRAZolam (0.5MG Tablet, Oral) Active. Omeprazole (20MG Capsule DR, Oral) Active. Vitamin D (Cholecalciferol) (Oral) Specific dose unknown - Active.  Social History (April Staton, [place]; 12/27/2015 11:26 AM) Caffeine use Coffee. No alcohol use No drug use Tobacco use Never smoker.  Family History (April Staton, [place]; 12/27/2015 11:26 AM) Alcohol Abuse Brother, Father. Breast Cancer Family Members In General. Cancer Family Members In [place] Mother. Diabetes Mellitus Father. Hypertension Father.  Pregnancy / Birth History (April Staton, [place]; 12/27/2015 11:26 AM) Age at menarche 69 years. Age of menopause  20-50 Gravida 2 Maternal age 74-20 Para 2     Review of Systems (April Staton CMA; 12/27/2015 11:26 AM) General Present- Fatigue and Night Sweats. Not Present- Appetite Loss, Chills, Fever, Weight Gain  and Weight Loss. Skin Not Present- Change in Wart/Mole, Dryness, Hives, Jaundice, New Lesions, Non-Healing Wounds, Rash and Ulcer. HEENT Present- Seasonal Allergies, Visual Disturbances and Wears glasses/contact lenses. Not Present- Earache, Hearing Loss, Hoarseness, Nose Bleed, Oral Ulcers, Ringing in the Ears, Sinus Pain, Sore Throat and Yellow Eyes. Respiratory Not Present- Bloody sputum, Chronic Cough, Difficulty Breathing, Snoring and Wheezing. Breast Present- Breast Mass and Breast Pain. Not Present- Nipple Discharge and Skin Changes. Cardiovascular Present- Leg Cramps, Palpitations and Rapid Heart Rate. Not Present- Chest Pain, Difficulty Breathing Lying Down, Shortness of Breath and Swelling of Extremities. Gastrointestinal Present- Indigestion. Not Present- Abdominal Pain, Bloating, Bloody Stool, Change in Bowel Habits, Chronic diarrhea, Constipation, Difficulty Swallowing, Excessive gas, Gets full quickly at meals, Hemorrhoids, Nausea, Rectal Pain and Vomiting. Female Genitourinary Present- Frequency. Not Present- Nocturia, Painful Urination, Pelvic Pain and Urgency. Musculoskeletal Present- Joint Pain and Joint Stiffness. Not Present- Back Pain, Muscle Pain, Muscle Weakness and Swelling of Extremities. Neurological Not Present- Decreased Memory, Fainting, Headaches, Numbness, Seizures, Tingling, Tremor, Trouble walking and Weakness. Psychiatric Present- Anxiety. Not Present- Bipolar, Change in Sleep Pattern, Depression, Fearful and Frequent crying. Endocrine Present- Hair Changes and Hot flashes. Not Present- Cold Intolerance, Excessive Hunger, Heat Intolerance and New Diabetes. Hematology Not Present- Blood Thinners, Easy Bruising, Excessive bleeding, Gland problems, HIV and  Persistent Infections.  Vitals (April Staton CMA; 12/27/2015 11:29 AM) 12/27/2015 11:28 AM Weight: 167.25 lb Height: 66in Height was reported by patient. Body Surface Area: 1.85 m Body Mass Index: 26.99 kg/m  Temp.: 98.47F(Oral)  Pulse: 84 (Regular)  P.OX: 97% (Room air) BP: 130/80 (Sitting, Left Arm, Standard)      Physical Exam Rodman Key K. Deliana Avalos MD; 12/27/2015 12:30 PM)  The physical exam findings are as follows: Note:WDWN in NAD HEENT: EOMI, sclera anicteric Neck: No masses, no thyromegaly Breasts: asymmetric - left smaller than right Right breast shows no palpable masses or axillary lymphadenopathy. No nipple retraction or discharge Left breast shows a healed incision above the nipple. There is decreased volume in the left breast. The upper outer quadrant shows some indistinct nodularity. There is some palpable nodularity in the upper outer quadrant that correlates to the mammogram findings. There is significant bruising in this area from the biopsy. No axillary lymphadenopathy. The axillary incision is well-healed Lungs: CTA bilaterally; normal respiratory effort CV: Regular rate and rhythm; no murmurs Abd: +bowel sounds, soft, non-tender, no masses Ext: Well-perfused; no edema Skin: Warm, dry; no sign of jaundice    Assessment & Plan Rodman Key K. Osiris Odriscoll MD; 12/27/2015 12:33 PM)  RECURRENT BREAST CANCER, LEFT (C50.912)  Current Plans Pt Education - [facility] Breast Cancer Information Given - [facility] "Breast Journey" Package Schedule for Surgery - Left mastectomy with sentinel lymph node biopsy. The surgical procedure has been discussed with the patient. Potential risks, benefits, alternative treatments, and expected outcomes have been explained. All of the patient's questions at this time have been answered. The likelihood of reaching the patient's treatment goal is good. The patient understand the proposed surgical procedure and wishes to proceed. Note:I spent about 45  minutes with the patient and her family members discussing her disease as well as surgical options. Previously, she had breast conserving therapy that included lumpectomy, sentinel lymph node biopsy, and radiation as well as adjuvant hormonal treatment. Unfortunately, with this recurrence, she will need to have a left mastectomy as she should not undergo any further radiation. We discussed the options for mastectomy including traditional mastectomy with sentinel lymph node biopsy, mastectomy with immediate reconstruction  by plastic surgery, or even nipple sparing mastectomy with immediate reconstruction by plastic surgery. The patient has no interest in having reconstruction. She already requires a prosthetic insert in her left bra because of the decreased size compared to the right after her last surgery. She plans to just have a mastectomy and will wear a full size prosthetic on the left.  We will refer her to the cancer Center at [facility] to Dr. Ancil Linsey. Based on the findings of the sentinel lymph node, if it is determined that the patient needs to have chemotherapy, we will place a port on an outpatient basis.  We discussed the procedure for a left mastectomy with sentinel lymph node biopsy. The surgical procedure has been discussed with the patient. Potential risks, benefits, alternative treatments, and expected outcomes have been explained. All of the patient's questions at this time have been answered. The likelihood of reaching the patient's treatment goal is good. The patient understand the proposed surgical procedure and wishes to proceed.  [name]. Georgette Dover, MD, [facility]  General/ Trauma Surgery  12/27/2015 12:34 PM

## 2016-01-07 NOTE — Anesthesia Procedure Notes (Addendum)
Anesthesia Regional Block:  Pectoralis block  Pre-Anesthetic Checklist: ,, timeout performed, Correct Patient, Correct Site, Correct Laterality, Correct Procedure, Correct Position, site marked, Risks and benefits discussed,  Surgical consent,  Pre-op evaluation,  At surgeon's request and post-op pain management  Laterality: Left  Prep: chloraprep       Needles:  Injection technique: Single-shot  Needle Type: Echogenic Needle     Needle Length: 9cm 9 cm Needle Gauge: 21 and 21 G    Additional Needles:  Procedures: ultrasound guided (picture in chart) Pectoralis block Narrative:  Injection made incrementally with aspirations every 5 mL.  Performed by: Personally  Anesthesiologist: Catalina Gravel  Additional Notes: No pain on injection. No increased resistance to injection. Injection made in 5cc increments.  Good needle visualization.  Patient tolerated procedure well.

## 2016-01-07 NOTE — Transfer of Care (Signed)
Immediate Anesthesia Transfer of Care Note  Patient: [name]  Procedure(s) Performed: Procedure(s): LEFT TOTAL MASTECTOMY WITH LEFT AXILLARY SENTINEL LYMPH NODE BIOPSY (Left) INSERTION PORT-A-CATH RIGHT INTERNAL JUGULAR WITH ULTRASOUND (Right)  Patient Location: PACU  Anesthesia Type:General  Level of Consciousness: awake, alert  and oriented  Airway & Oxygen Therapy: Patient Spontanous Breathing and Patient connected to nasal cannula oxygen  Post-op Assessment: Report given to RN, Post -op Vital signs reviewed and stable and Patient moving all extremities X 4  Post vital signs: Reviewed and stable  Last Vitals:  Vitals:   01/07/16 0553 01/07/16 0944  BP: (!) 155/82   Pulse: 85   Resp: 20   Temp: 36.9 C (!) 36.1 C    Last Pain:  Vitals:   01/07/16 0944  TempSrc:   PainSc: Asleep      Patients Stated Pain Goal: 3 ([phone] [phone])  Complications: No apparent anesthesia complications

## 2016-01-07 NOTE — Anesthesia Postprocedure Evaluation (Signed)
Anesthesia Post Note  Patient: [name]  Procedure(s) Performed: Procedure(s) (LRB): LEFT TOTAL MASTECTOMY WITH LEFT AXILLARY SENTINEL LYMPH NODE BIOPSY (Left) INSERTION PORT-A-CATH RIGHT INTERNAL JUGULAR WITH ULTRASOUND (Right)  Patient location during evaluation: PACU Anesthesia Type: General and Regional Level of consciousness: awake and alert Pain management: pain level controlled Vital Signs Assessment: post-procedure vital signs reviewed and stable Respiratory status: spontaneous breathing, nonlabored ventilation, respiratory function stable and patient connected to nasal cannula oxygen Cardiovascular status: blood pressure returned to baseline and stable Postop Assessment: no signs of nausea or vomiting Anesthetic complications: no    Last Vitals:  Vitals:   01/07/16 1143 01/07/16 1438  BP: 132/74 120/66  Pulse: 73 88  Resp:  14  Temp: 36.5 C 36.8 C    Last Pain:  Vitals:   01/07/16 1438  TempSrc: Oral  PainSc:                  Catalina Gravel

## 2016-01-07 NOTE — Op Note (Signed)
Preoperative diagnosis: Recurrent left breast cancer Postoperative diagnosis: Same Procedure performed: #1 ultrasound-guided right internal jugular vein port placement #2 blue dye injection #3 left mastectomy with level I axillary lymph node dissection Surgeon:Luceal Hollibaugh K. Asst.: Sharyn Dross RNFA Anesthesia: Gen. via LMA and Pec block Indications:  This is a 60 year old female who is status post lumpectomy and sentinel lymph node biopsy for invasive ductal carcinoma and DCIS of 2001. She was treated with radiation and tamoxifen. She had 0 out of 6 sentinel lymph nodes positive at that time. Recently she had a routine screening mammogram that showed a suspicious finding in the upper outer quadrant left breast. Ultrasound showed a 1.4 cm spiculated mass in the upper-outer quadrant. PR and ER are negative. HER-2 is positive. The patient presents now for mastectomy as well as port placement for chemotherapy.  Description of procedure: The patient is brought to the operating room placed in the supine position on the operating room table. After an adequate level of general anesthesia was obtained, a roll was placed behind her shoulders. Her left arm was extended on an armboard. The right arm was tucked at her side. Her left nipple was prepped with alcohol and 5 mL's of methylene blue dye solution were injected intradermally. The patient has a lumpectomy incision above her nipple and another sentinel lymph node biopsy incision in her axilla. She had previously been injected with technetium sulfa colloid in the preoperative area. We interrogated her axilla and there is no activity in this area.  Her entire chest and neck were prepped with ChloraPrep and draped in sterile fashion. A timeout was taken to ensure the proper patient and proper procedure. We used the ultrasound to identify the right internal jugular vein. Our insertion site is at the apex of the triangle of the heads of the sternocleidomastoid.  An 18-gauge needle was used to cannulate the internal jugular vein under ultrasound guidance. There was good blood return. The wire passed easily. Fluoroscopy confirmed that the wire passed down the right subtle mediastinum into the right atrium. The needle was removed. We created a subcutaneous anus pocket on the right chest. We infiltrated this area with 0.25% Marcaine with epinephrine. An 8 French [place] port was assembled and was inserted into the pocket. We tunneled the catheter from the subcutaneous pocket to the insertion site on the neck. The port was secured with 2-0 Prolene sutures. We then passed the dilator and breakaway sheath over the wire under fluoroscopic guidance. We estimated the length of the catheter at 26 cm and cut this to fit. The dilator and wire were removed. The catheter was passed through the breakaway sheath under fluoroscopic guidance. The sheath was removed. We were able to aspirate blood easily and were able to flush easily. Fluoroscopy confirmed that there were no kinks along the course of the catheter. The wound was closed with 3-0 Vicryl. Both skin incisions were closed with 4-0 Monocryl. Benzoin Steri-Strips were applied.  We then turned our attention to the left chest. We outlined an elliptical incision to include her previous lumpectomy scar. We made our incisions and raise skin flaps. Superiorly we dissected up to the chest wall in the infraclavicular fossa. Medially we dissected the edge of the sternum. Inferiorly we dissected down chest wall at the inframammary crease. Laterally we dissected to the anterior edge of the latissimus. We then interrogated the axilla again with the neoprobe. There is no activity noted here. There is no blue dye noted in the axilla. It appears  that all of the lymphatic pathways from the nipple towards the axilla were disrupted from the previous sentinel lymph node biopsy and lumpectomy. Therefore we dissected the edge of the pectoralis. I went  underneath the edge of the pectoralis and performed a level I lymph node dissection. The long thoracic nerve was identified. We did not dissect posteriorly to the thoracodorsal bundle. I felt that I could palpate some lymph nodes within the level I specimen. We then removed the breast from the chest wall from medial to lateral with the lymph nodes at the apex of the axilla in the single specimen. This was oriented with a long suture lateral, short suture superior, and a single suture in the axillary contents. This was sent for pathologic examination. We irrigated the wound thoroughly and inspected for hemostasis. A 19 French drain was placed through a small stab incision. This was secured with 3-0 nylon. The wound was closed with multiple interrupted 3-0 Vicryl sutures and staples. The patient was then dressed with Xeroform and dry gauze. A breast binder was placed. She was extubated and brought to the recovery room in stable condition. All sponge, instrument, and needle counts are correct.    Imogene Burn. Georgette Dover, MD, [facility]  General/ Trauma Surgery  01/07/2016 9:43 AM

## 2016-01-07 NOTE — Progress Notes (Signed)
Pt admitted to 6N24 via bed from PACU.  Pt AAO X 4.  Pt on 2L O2 via [place].  Pt has 20G to rt hand with fluids infusing.  Pt has transparent dressing with gauze to rt IJ and PAC placed to rt chest.  Pt has incision to lt breast with xeroform, gauze, ABD, and breast binder.  Pt has SCDs in place.  Pt has no questions at the moment.  Will continue to monitor.  Report rcvd from [place], [place].

## 2016-01-07 NOTE — Anesthesia Procedure Notes (Signed)
Procedure Name: Intubation Date/Time: 01/07/2016 7:37 AM Performed by: Mariea Clonts Pre-anesthesia Checklist: Emergency Drugs available, Patient identified, Timeout performed, Suction available and Patient being monitored Patient Re-evaluated:Patient Re-evaluated prior to inductionOxygen Delivery Method: Circle system utilized Preoxygenation: Pre-oxygenation with 100% oxygen Intubation Type: IV induction Ventilation: Mask ventilation without difficulty and Oral airway inserted - appropriate to patient size Laryngoscope Size: Sabra Heck and 2 Grade View: Grade I Tube type: Oral Tube size: 7.0 mm Number of attempts: 1 Placement Confirmation: ETT inserted through vocal cords under direct vision,  positive ETCO2 and breath sounds checked- equal and bilateral Tube secured with: Tape Dental Injury: Teeth and Oropharynx as per pre-operative assessment

## 2016-01-07 NOTE — Interval H&P Note (Signed)
History and Physical Interval Note:  01/07/2016 7:00 AM  [name]  has presented today for surgery, with the diagnosis of RECURRENT LEFT BREAST CANCER  The various methods of treatment have been discussed with the patient and family. After consideration of risks, benefits and other options for treatment, the patient has consented to  Procedure(s): LEFT MASTECTOMY WITH SENTINEL LYMPH NODE BIOPSY (Left) INSERTION PORT-A-CATH WITH [place] (N/A) as a surgical intervention .  The patient's history has been reviewed, patient examined, no change in status, stable for surgery.  I have reviewed the patient's chart and labs.  Questions were answered to the patient's satisfaction.    After discussion with Dr. Whitney Muse and the patient, we will proceed with port placement as well as left mastectomy.  Mirinda Monte K.

## 2016-01-08 ENCOUNTER — Encounter (HOSPITAL_COMMUNITY): Payer: Self-pay | Admitting: Surgery

## 2016-01-08 DIAGNOSIS — Z171 Estrogen receptor negative status [ER-]: Secondary | ICD-10-CM | POA: Diagnosis not present

## 2016-01-08 DIAGNOSIS — F419 Anxiety disorder, unspecified: Secondary | ICD-10-CM | POA: Diagnosis not present

## 2016-01-08 DIAGNOSIS — K219 Gastro-esophageal reflux disease without esophagitis: Secondary | ICD-10-CM | POA: Diagnosis not present

## 2016-01-08 DIAGNOSIS — C50412 Malignant neoplasm of upper-outer quadrant of left female breast: Secondary | ICD-10-CM | POA: Diagnosis not present

## 2016-01-08 LAB — CBC
HCT: 43.4 % (ref 36.0–46.0)
HEMOGLOBIN: 13.7 g/dL (ref 12.0–15.0)
MCH: 30.8 pg (ref 26.0–34.0)
MCHC: 31.6 g/dL (ref 30.0–36.0)
MCV: 97.5 fL (ref 78.0–100.0)
Platelets: 249 10*3/uL (ref 150–400)
RBC: 4.45 MIL/uL (ref 3.87–5.11)
RDW: 13.1 % (ref 11.5–15.5)
WBC: 11.2 10*3/uL — ABNORMAL HIGH (ref 4.0–10.5)

## 2016-01-08 LAB — BASIC METABOLIC PANEL
ANION GAP: 9 (ref 5–15)
BUN: 9 mg/dL (ref 6–20)
CALCIUM: 9.1 mg/dL (ref 8.9–10.3)
CO2: 21 mmol/L — AB (ref 22–32)
CREATININE: 0.64 mg/dL (ref 0.44–1.00)
Chloride: 106 mmol/L (ref 101–111)
GFR calc Af Amer: >60 mL/min (ref >60)
GFR calc non Af Amer: >60 mL/min (ref >60)
GLUCOSE: 101 mg/dL — AB (ref 65–99)
Potassium: 4.7 mmol/L (ref 3.5–5.1)
Sodium: 136 mmol/L (ref 135–145)

## 2016-01-08 MED ORDER — OXYCODONE HCL 5 MG PO TABS: 5.0000 mg | ORAL_TABLET | ORAL | 0 refills | Status: DC | PRN

## 2016-01-08 MED ORDER — PHENOL 1.4 % MT LIQD
1.0000 | OROMUCOSAL | Status: DC | PRN
  Filled 2016-01-08: qty 177

## 2016-01-08 NOTE — Discharge Summary (Signed)
Physician Discharge Summary  Patient ID: ASLEE EDLIN MRN: [id number] DOB/AGE: 05-25-1955 60 y.o.  Admit date: 01/07/2016 Discharge date: 01/08/2016  Admission Diagnoses:  Recurrent left breast cancer   Discharge Diagnoses: same Active Problems:   Breast cancer, female, left   Discharged Condition: good  Hospital Course: Right IJ port placement/ left mastectomy on 01/07/16.  Did well overnight.  Sore, but pain controlled with PO pain meds.  Moderate drain output  Consults: None  Significant Diagnostic Studies: radiology: CLINICAL DATA:  60 year old who simultaneously underwent left mastectomy for breast cancer and had a Port-A-Cath placed intraoperatively.  EXAM: PORTABLE CHEST 1 VIEW  COMPARISON:  01/12/2015, 12/31/2014 and earlier.  FINDINGS: Right jugular Port-A-Cath tip projects at or near the expected location of the cavoatrial junction. No evidence of pneumothorax or mediastinal hematoma. Postsurgical changes related to left mastectomy with skin staples overlying the left hemithorax. Suboptimal inspiration accounts for crowded bronchovascular markings, especially in the bases, and accentuates the cardiac silhouette. Taking this into account, cardiomediastinal silhouette unremarkable and lungs clear.  IMPRESSION: 1. Right jugular Port-A-Cath tip at or near the expected location of the cavoatrial junction. No acute complicating features. 2. Suboptimal inspiration.  No acute cardiopulmonary disease.   Electronically Signed   By: Evangeline Dakin M.D.   On: 01/07/2016 10:26    Treatments: Left mastectomy/ right port placement  Discharge Exam: Blood pressure 111/61, pulse 75, temperature 98.6 F (37 C), temperature source Oral, resp. rate 14, height 5\' 6"  (1.676 m), weight 77.2 kg (170 lb 4.8 oz), SpO2 97 %. General appearance: alert, cooperative and no distress Chest wall: left tenderness; skin flaps - no ischemia or ecchymosis; staple line intact   Drain - serosanguinous output Port site clean and dry  Disposition: 01-Home or Self Care  Discharge Instructions    Call MD for:  persistant nausea and vomiting    Complete by:  As directed   Call MD for:  redness, tenderness, or signs of infection (pain, swelling, redness, odor or green/yellow discharge around incision site)    Complete by:  As directed   Call MD for:  severe uncontrolled pain    Complete by:  As directed   Call MD for:  temperature >100.4    Complete by:  As directed   Diet general    Complete by:  As directed   Discharge wound care:    Complete by:  As directed   Empty drain and record output twice a day.  Bring this with you to your follow-up visit  Sponge baths only.  Do not get the port site or the drain site wet.  You may change the gauze over the staple line as needed.   Driving Restrictions    Complete by:  As directed   Do not drive while taking pain medications   Increase activity slowly    Complete by:  As directed       Medication List    TAKE these medications   acetaminophen 500 MG tablet Commonly known as:  TYLENOL Take 500 mg by mouth every 6 (six) hours as needed for mild pain.   ALPRAZolam 0.5 MG tablet Commonly known as:  XANAX Take 0.5 tablets by mouth daily as needed for anxiety.   antiseptic oral rinse Liqd 15 mLs by Mouth Rinse route as needed for dry mouth.   metoprolol tartrate 25 MG tablet Commonly known as:  LOPRESSOR Take 12.5-25 mg by mouth 2 (two) times daily. 25 mg in the morning. 12.5 mg  in the evening,   omeprazole 20 MG capsule Commonly known as:  PRILOSEC Take 20 mg by mouth every evening.   oxyCODONE 5 MG immediate release tablet Commonly known as:  Oxy IR/ROXICODONE Take 1-2 tablets (5-10 mg total) by mouth every 4 (four) hours as needed for moderate pain.   SYSTANE 0.4-0.3 % Soln Generic drug:  Polyethyl Glycol-Propyl Glycol Apply 1 drop to eye at bedtime.   Vitamin D3 1000 units Caps Take 1,000 Units by  mouth every morning.        Signed: Jewett Mcgann K. 01/08/2016, 8:30 AM

## 2016-01-08 NOTE — Discharge Instructions (Signed)
[facility] °[phone] ° °MASTECTOMY: POST OP INSTRUCTIONS ° °Always review your discharge instruction sheet given to you by the facility where your surgery was performed. °IF YOU HAVE DISABILITY OR FAMILY LEAVE FORMS, YOU MUST BRING THEM TO THE OFFICE FOR PROCESSING.   °DO NOT GIVE THEM TO YOUR DOCTOR. °A prescription for pain medication may be given to you upon discharge.  Take your pain medication as prescribed, if needed.  If narcotic pain medicine is not needed, then you may take acetaminophen (Tylenol) or ibuprofen (Advil) as needed. °1. Take your usually prescribed medications unless otherwise directed. °2. If you need a refill on your pain medication, please contact your pharmacy.  They will contact our office to request authorization.  Prescriptions will not be filled after 5pm or on week-ends. °3. You should follow a light diet the first few days after arrival home, such as soup and crackers, etc.  Resume your normal diet the day after surgery. °4. Most patients will experience some swelling and bruising on the chest and underarm.  Ice packs will help.  Swelling and bruising can take several days to resolve.  °5. It is common to experience some constipation if taking pain medication after surgery.  Increasing fluid intake and taking a stool softener (such as Colace) will usually help or prevent this problem from occurring.  A mild laxative (Milk of Magnesia or Miralax) should be taken according to package instructions if there are no bowel movements after 48 hours. °6. Unless discharge instructions indicate otherwise, leave your bandage dry and in place until your next appointment in 3-5 days.  You may take a limited sponge bath.  No tube baths or showers until the drains are removed.  You may have steri-strips (small skin tapes) in place directly over the incision.  These strips should be left on the skin for 7-10 days.  If your surgeon used skin glue on the incision, you may  shower in 24 hours.  The glue will flake off over the next 2-3 weeks.  Any sutures or staples will be removed at the office during your follow-up visit. °7. DRAINS:  If you have drains in place, it is important to keep a list of the amount of drainage produced each day in your drains.  Before leaving the hospital, you should be instructed on drain care.  Call our office if you have any questions about your drains. °8. ACTIVITIES:  You may resume regular (light) daily activities beginning the next day--such as daily self-care, walking, climbing stairs--gradually increasing activities as tolerated.  You may have sexual intercourse when it is comfortable.  Refrain from any heavy lifting or straining until approved by your doctor. °a. You may drive when you are no longer taking prescription pain medication, you can comfortably wear a seatbelt, and you can safely maneuver your car and apply brakes. °b. RETURN TO WORK:  __________________________________________________________ °9. You should see your doctor in the office for a follow-up appointment approximately 3-5 days after your surgery.  Your doctor’s nurse will typically make your follow-up appointment when she calls you with your pathology report.  Expect your pathology report 2-3 business days after your surgery.  You may call to check if you do not hear from us after three days.   °10. OTHER INSTRUCTIONS: ______________________________________________________________________________________________ ____________________________________________________________________________________________ °WHEN TO CALL YOUR DOCTOR: °1. Fever over 101.0 °2. Nausea and/or vomiting °3. Extreme swelling or bruising °4. Continued bleeding from incision. °5. Increased pain, redness, or drainage from the incision. °  The clinic staff is available to answer your questions during regular business hours.  Please don’t hesitate to call and ask to speak to one of the nurses for clinical  concerns.  If you have a medical emergency, go to the nearest emergency room or call 911.  A surgeon from [facility] is always on call at the hospital. °[address], [address], [place], [place]  [address] ? [address] [address], [place], [place]   [address] °[phone] ? [phone] ? FAX [phone] °Web site: [email or web address] °

## 2016-01-08 NOTE — Progress Notes (Signed)
Pt discharge instructions given. Explained drain and port site care with patient. Pt and family member verbalized understanding. Pain medication prescription given to patient. IV removed. All questions answered. Pt discharged and taken to car by wheelchair.

## 2016-01-13 DIAGNOSIS — C50912 Malignant neoplasm of unspecified site of left female breast: Secondary | ICD-10-CM | POA: Diagnosis not present

## 2016-01-16 ENCOUNTER — Ambulatory Visit (HOSPITAL_COMMUNITY): Payer: BLUE CROSS/BLUE SHIELD | Admitting: Hematology & Oncology

## 2016-01-20 DIAGNOSIS — C50912 Malignant neoplasm of unspecified site of left female breast: Secondary | ICD-10-CM | POA: Diagnosis not present

## 2016-01-22 ENCOUNTER — Other Ambulatory Visit (HOSPITAL_COMMUNITY): Payer: Self-pay | Admitting: Hematology & Oncology

## 2016-01-22 ENCOUNTER — Other Ambulatory Visit (HOSPITAL_COMMUNITY): Payer: Self-pay | Admitting: Emergency Medicine

## 2016-01-22 ENCOUNTER — Encounter (HOSPITAL_COMMUNITY): Payer: BLUE CROSS/BLUE SHIELD | Attending: Hematology & Oncology | Admitting: Oncology

## 2016-01-22 ENCOUNTER — Encounter (HOSPITAL_COMMUNITY): Payer: Self-pay | Admitting: Oncology

## 2016-01-22 DIAGNOSIS — C50412 Malignant neoplasm of upper-outer quadrant of left female breast: Secondary | ICD-10-CM

## 2016-01-22 DIAGNOSIS — Z171 Estrogen receptor negative status [ER-]: Secondary | ICD-10-CM

## 2016-01-22 NOTE — Progress Notes (Signed)
[name], [place] [address]  Breast cancer of upper-outer quadrant of left female breast ([place]) - Plan: cephALEXin (KEFLEX) 250 MG capsule, ECHOCARDIOGRAM COMPLETE  CURRENT THERAPY: Planning for adjuvant systemic chemotherapy.  INTERVAL HISTORY: [name] 60 y.o. female returns for followup of Stage IA (T1CN0M0) left invasive ductal carcinoma of upper outer quadrant, ER/PR negative, HER2 POSITIVE.  S/P simple left mastectomy by Dr. Georgette Dover on 01/07/2016. AND Prior history of left breast cancer at the age of 89, ER+, treated with lumpectomy followed by XRT, and then 5 years worth of Tamoxifen.    Breast cancer of upper-outer quadrant of left female breast ([place])   11/07/1999 Surgery    Lumpectomy/sentinel LN biopsy invasive ductal carcinoma and DCIS at [facility], 0/6 sentinel LN      11/10/1999 - 01/08/2005 Anti-estrogen oral therapy    Tamoxifen 20 mg daily      11/24/1999 - 01/02/2000 Radiation Therapy         12/18/2015 Mammogram    2D digital screening bilateral mammogram with CAD and adjunct TOMO, in the L breast a possible mass warrants further evaluation. In R breast no findings suspicious for malignancy      12/24/2015 Pathology Results    Invasive ductal carcinoma Grade 3 Left core needle biopsy 1:30 o clock ER- PR- Ki67 70%, HER 2 positive      12/24/2015 Imaging    L breast Ultrasound, notes palpable area of concern demonstrates a hypoechoic irregular mass at 1:30, 7 cm from the nipple measuring 1.3 x 0.9 x 1.4 cm. No evidence of L axillary LAD      01/07/2016 Procedure    Left simple mastectomy by Dr. Georgette Dover      01/08/2016 Pathology Results    Breast, simple mastectomy, Left - INVASIVE DUCTAL CARCINOMA, GRADE III/III, SPANNING 1.4 CM. - THE SURGICAL RESECTION MARGINS ARE NEGATIVE FOR CARCINOMA.      We reviewed her pathology report and she is given a copy.  Based upon pathology, she has an early stage, Stage I,  breast cancer.  It is HER2 POSITIVE.    We reviewed the NCCN guidelines pertaining to preferred/optimal management of her breast cancer.  She is provided a copy of the NCCN guidelines.  She denies any complaints today, but she is very nervous/anxious about systemic chemotherapy.  One of her concerns is alopecia.  Another concern is nausea and vomiting.  Review of Systems  Constitutional: Negative.  Negative for chills, fever and weight loss.  HENT: Negative.   Eyes: Negative.  Negative for blurred vision.  Respiratory: Negative.  Negative for cough.   Cardiovascular: Negative.  Negative for chest pain.  Gastrointestinal: Negative.  Negative for constipation, diarrhea, nausea and vomiting.  Genitourinary: Negative.   Musculoskeletal: Negative.   Skin: Negative.   Neurological: Negative.  Negative for weakness and headaches.  Endo/Heme/Allergies: Negative.   Psychiatric/Behavioral: The patient is nervous/anxious.     Past Medical History:  Diagnosis Date  . Anxiety   . Breast cancer ([place])   . Breast cancer of upper-outer quadrant of left female breast ([place]) 01/07/2016  . Complication of anesthesia   . DDD (degenerative disc disease), cervical   . DDD (degenerative disc disease), lumbar   . Depression   . Dysrhythmia    last cardiology ov 06-13-2013 in [facility], takes metoprolol for HR  . Fibromyalgia   . GERD (gastroesophageal reflux disease)   . Hematuria   . History  of echocardiogram 2015   mild mital insufficiency, otherwise normal  . Leaky heart valve   . Multiple thyroid nodules   . PAC (premature atrial contraction)   . Palpitations   . PONV (postoperative nausea and vomiting)   . SOB (shortness of breath)   . Tachycardia     Past Surgical History:  Procedure Laterality Date  . BREAST LUMPECTOMY    . LAPAROSCOPIC APPENDECTOMY N/A 11/10/2012   Procedure: APPENDECTOMY LAPAROSCOPIC;  Surgeon: Jamesetta So, MD;  Location: [facility];  Service: General;  Laterality: N/A;  .  MASTECTOMY    . MASTECTOMY W/ SENTINEL NODE BIOPSY Left 01/07/2016  . MASTECTOMY W/ SENTINEL NODE BIOPSY Left 01/07/2016   Procedure: LEFT TOTAL MASTECTOMY WITH LEFT AXILLARY SENTINEL LYMPH NODE BIOPSY;  Surgeon: Donnie Mesa, MD;  Location: [place];  Service: General;  Laterality: Left;  . PORTACATH PLACEMENT Right 01/07/2016   IJ  . PORTACATH PLACEMENT Right 01/07/2016   Procedure: INSERTION PORT-A-CATH RIGHT INTERNAL JUGULAR WITH ULTRASOUND;  Surgeon: Donnie Mesa, MD;  Location: [place];  Service: General;  Laterality: Right;    Family History  Problem Relation Age of Onset  . Cancer Mother     colon  . Diabetes Father   . Diabetes Sister   . Alcohol abuse Brother   . Cancer Brother 63    lung cancer  . Cancer Brother     penile    Social History   Social History  . Marital status: Married    Spouse name: N/A  . Number of children: N/A  . Years of education: N/A   Social History Main Topics  . Smoking status: Never Smoker  . Smokeless tobacco: Never Used  . Alcohol use No  . Drug use: No  . Sexual activity: Yes     Comment: married   Other Topics Concern  . None   Social History Narrative  . None     PHYSICAL EXAMINATION  ECOG PERFORMANCE STATUS: 0 - Asymptomatic  Vitals:   01/22/16 0955  BP: 128/74  Pulse: 85  Resp: 18  Temp: 98.1 F (36.7 C)    GENERAL:alert, no distress, well nourished, well developed, anxious, comfortable, cooperative, smiling and accompanied by her husband, [name]. SKIN: skin color, texture, turgor are normal, no rashes or significant lesions HEAD: Normocephalic, No masses, lesions, tenderness or abnormalities EYES: normal, EOMI, Conjunctiva are pink and non-injected EARS: External ears normal OROPHARYNX:lips, buccal mucosa, and tongue normal and mucous membranes are moist  NECK: supple, trachea midline LYMPH:  not examined BREAST:not examined LUNGS: clear to auscultation  HEART: regular rate & rhythm ABDOMEN:abdomen soft and  normal bowel sounds BACK: Back symmetric, no curvature. EXTREMITIES:less then 2 second capillary refill, no joint deformities, effusion, or inflammation, no skin discoloration, no cyanosis  NEURO: alert & oriented x 3 with fluent speech, no focal motor/sensory deficits, gait normal   LABORATORY DATA: CBC    Component Value Date/Time   WBC 11.2 (H) 01/08/2016 0353   RBC 4.45 01/08/2016 0353   HGB 13.7 01/08/2016 0353   HCT 43.4 01/08/2016 0353   PLT 249 01/08/2016 0353   MCV 97.5 01/08/2016 0353   MCH 30.8 01/08/2016 0353   MCHC 31.6 01/08/2016 0353   RDW 13.1 01/08/2016 0353   LYMPHSABS 2.7 12/31/2014 1640   MONOABS 0.5 12/31/2014 1640   EOSABS 0.3 12/31/2014 1640   BASOSABS 0.0 12/31/2014 1640      Chemistry      Component Value Date/Time   NA 136  01/08/2016 0353   K 4.7 01/08/2016 0353   CL 106 01/08/2016 0353   CO2 21 (L) 01/08/2016 0353   BUN 9 01/08/2016 0353   CREATININE 0.64 01/08/2016 0353      Component Value Date/Time   CALCIUM 9.1 01/08/2016 0353   ALKPHOS 78 11/09/2012 2355   AST 34 11/09/2012 2355   ALT 26 11/09/2012 2355   BILITOT 0.3 11/09/2012 2355        PENDING LABS:   RADIOGRAPHIC STUDIES:  Nm Sentinel Node Inj-no Rpt (breast)  Result Date: 01/07/2016 CLINICAL DATA: Left breast cancer Sulfur colloid was injected intradermally by the nuclear medicine technologist for breast cancer sentinel node localization.   Dg Chest Port 1 View  Result Date: 01/07/2016 CLINICAL DATA:  60 year old who simultaneously underwent left mastectomy for breast cancer and had a Port-A-Cath placed intraoperatively. EXAM: PORTABLE CHEST 1 VIEW COMPARISON:  01/12/2015, 12/31/2014 and earlier. FINDINGS: Right jugular Port-A-Cath tip projects at or near the expected location of the cavoatrial junction. No evidence of pneumothorax or mediastinal hematoma. Postsurgical changes related to left mastectomy with skin staples overlying the left hemithorax. Suboptimal inspiration  accounts for crowded bronchovascular markings, especially in the bases, and accentuates the cardiac silhouette. Taking this into account, cardiomediastinal silhouette unremarkable and lungs clear. IMPRESSION: 1. Right jugular Port-A-Cath tip at or near the expected location of the cavoatrial junction. No acute complicating features. 2. Suboptimal inspiration.  No acute cardiopulmonary disease. Electronically Signed   By: Evangeline Dakin M.D.   On: 01/07/2016 10:26   Mm Digital Diagnostic Unilat L  Result Date: 12/24/2015 CLINICAL DATA:  Evaluate clip placement EXAM: DIAGNOSTIC LEFT MAMMOGRAM POST ULTRASOUND BIOPSY COMPARISON:  Previous exam(s). FINDINGS: Mammographic images were obtained following ultrasound guided biopsy of a left breast mass. The coil shaped clip is within the biopsied mass IMPRESSION: Appropriate placement of biopsy clip. Final Assessment: Post Procedure Mammograms for Marker Placement Electronically Signed   By: Dorise Bullion III M.D   On: 12/24/2015 15:55   Dg Fluoro Guide Cv Line-no Report  Result Date: 01/07/2016 CLINICAL DATA:  FLOURO GUIDE CV LINE Fluoroscopy was utilized by the requesting physician.  No radiographic interpretation.   US Breast Ltd Uni Left Inc Axilla  Result Date: 12/24/2015 CLINICAL DATA:  60 year old female presenting for evaluation of a possible left breast mass identified on screening mammography. The patient has history of a left breast cancer status post left breast lumpectomy in 2001. EXAM: 2D DIGITAL DIAGNOSTIC UNILATERAL LEFT MAMMOGRAM WITH CAD AND ADJUNCT TOMO LEFT BREAST ULTRASOUND COMPARISON:  Previous exam(s). ACR Breast Density Category b: There are scattered areas of fibroglandular density. FINDINGS: There is a spiculated mass in the upper-outer quadrant of the left breast, superior to the lumpectomy sites spanning approximately 1.3 cm. Mammographic images were processed with CAD. Physical exam of the upper-outer quadrant of the left breast  demonstrates a firm fixed mass at approximately 1:30. Ultrasound targeted to the palpable area of concern demonstrates a hypoechoic irregular mass at 1:30, 7 cm from the nipple measuring 1.3 x 0.9 x 1.4 cm. Internal blood flow is documented on color Doppler imaging. Ultrasound of the left axilla demonstrates 1 normal-appearing lymph node. IMPRESSION: 1. There is a highly suspicious 1.4 cm mass in the left breast at 1:30. 2.  No evidence of left axillary lymphadenopathy. RECOMMENDATION: Ultrasound-guided biopsy is recommended for the left breast mass, and has been scheduled for 12/24/2015 at 3 p.m. I have discussed the findings and recommendations with the patient. Results were also provided  in writing at the conclusion of the visit. If applicable, a reminder letter will be sent to the patient regarding the next appointment. BI-RADS CATEGORY  5: Highly suggestive of malignancy. Electronically Signed   By: Ammie Ferrier M.D.   On: 12/24/2015 11:31   Mm Diag Breast Tomo Uni Left  Result Date: 12/24/2015 CLINICAL DATA:  60 year old female presenting for evaluation of a possible left breast mass identified on screening mammography. The patient has history of a left breast cancer status post left breast lumpectomy in 2001. EXAM: 2D DIGITAL DIAGNOSTIC UNILATERAL LEFT MAMMOGRAM WITH CAD AND ADJUNCT TOMO LEFT BREAST ULTRASOUND COMPARISON:  Previous exam(s). ACR Breast Density Category b: There are scattered areas of fibroglandular density. FINDINGS: There is a spiculated mass in the upper-outer quadrant of the left breast, superior to the lumpectomy sites spanning approximately 1.3 cm. Mammographic images were processed with CAD. Physical exam of the upper-outer quadrant of the left breast demonstrates a firm fixed mass at approximately 1:30. Ultrasound targeted to the palpable area of concern demonstrates a hypoechoic irregular mass at 1:30, 7 cm from the nipple measuring 1.3 x 0.9 x 1.4 cm. Internal blood flow is  documented on color Doppler imaging. Ultrasound of the left axilla demonstrates 1 normal-appearing lymph node. IMPRESSION: 1. There is a highly suspicious 1.4 cm mass in the left breast at 1:30. 2.  No evidence of left axillary lymphadenopathy. RECOMMENDATION: Ultrasound-guided biopsy is recommended for the left breast mass, and has been scheduled for 12/24/2015 at 3 p.m. I have discussed the findings and recommendations with the patient. Results were also provided in writing at the conclusion of the visit. If applicable, a reminder letter will be sent to the patient regarding the next appointment. BI-RADS CATEGORY  5: Highly suggestive of malignancy. Electronically Signed   By: Ammie Ferrier M.D.   On: 12/24/2015 11:31   [place] Lt Breast Bx W Loc Dev 1st Lesion Img Bx Spec US Guide  Addendum Date: 12/25/2015   ADDENDUM REPORT: 12/25/2015 10:27 ADDENDUM: Pathology revealed grade III invasive ductal carcinoma in the left breast. This was found to be concordant by Dr. Dorise Bullion. Pathology results were discussed with the patient by telephone. The patient reported doing well after the biopsy with tenderness at the site. Post biopsy instructions and care were reviewed and questions were answered. The patient was encouraged to call [facility] for any additional concerns. The patient requested surgical consultation in [place] and an appointment has been made with Dr. Donnie Mesa at [facility] on December 27, 2015. Pathology results reported by Susa Raring RN, BSN on 12/25/2015. Electronically Signed   By: Dorise Bullion III M.D   On: 12/25/2015 10:27   Result Date: 12/25/2015 CLINICAL DATA:  Left breast mass EXAM: ULTRASOUND GUIDED LEFT BREAST CORE NEEDLE BIOPSY COMPARISON:  Previous exam(s). FINDINGS: I met with the patient and we discussed the procedure of ultrasound-guided biopsy, including benefits and alternatives. We discussed the high likelihood  of a successful procedure. We discussed the risks of the procedure, including infection, bleeding, tissue injury, clip migration, and inadequate sampling. Informed written consent was given. The usual time-out protocol was performed immediately prior to the procedure. Using sterile technique and 1% Lidocaine as local anesthetic, under direct ultrasound visualization, a 12 gauge spring-loaded device was used to perform biopsy of a left breast mass using a lateral approach. At the conclusion of the procedure a tissue marker clip was deployed into the biopsy cavity. Follow up  2 view mammogram was performed and dictated separately. IMPRESSION: Ultrasound guided biopsy of a left breast mass. No apparent complications. Electronically Signed: By: Dorise Bullion III M.D On: 12/24/2015 15:30     PATHOLOGY:    ASSESSMENT AND PLAN:  Breast cancer of upper-outer quadrant of left female breast ([place]) Stage IA (T1CN0M0) left invasive ductal carcinoma of upper outer quadrant, ER/PR negative, HER2 POSITIVE.  S/P simple left mastectomy by Dr. Georgette Dover on 01/07/2016.  Oncology history updated.  Staging in [facility] problem list is completed.  I personally reviewed and went over pathology results with the patient.  Pathology from left mastectomy demonstrates a 1.4 cm invasive ductal carcinoma with 0/2 lymph nodes resulting in a T1CN0 malignancy = Stage I.  I have reviewed the NCCN guidelines regarding her disease.  Following the NCCN algorithm for her disease provides 2 preferred regimens: AC followed by T + Herceptin versus TCH.  Given her cardiac history we have opted for [facility] therapy.  I reviewed the risks, benefits, alternatives, and side effects of this therapy and regimen, including, but not limited to, peripheral neuropathy, alopecia, allergic reaction, nail changes, fluid retention, stomatitis, diarrhea, nausea, vomiting, decreased blood counts, increased risk of infection, change in renal function, decreased LVEF, and  death.  She has a port in place.  Order is placed for 2D Echo.  She has been referred to [facility].  This appt is tomorrow, 9/21.    She will be referred for chemotherapy teaching.   We will refer to Cardiology prior to start of chemotherapy at her request.  She would like to see someone locally.  She will return for day 1 of cycle 1 of treatment.  She will be seen 7-10 days post treatment for nadir check with labs and follow-up appointment.   ORDERS PLACED FOR THIS ENCOUNTER: Orders Placed This Encounter  Procedures  . ECHOCARDIOGRAM COMPLETE    MEDICATIONS PRESCRIBED THIS ENCOUNTER: Meds ordered this encounter  Medications  . cephALEXin (KEFLEX) 250 MG capsule    Sig: take 1 capsule by mouth four times a day    Refill:  0    THERAPY PLAN:  Plan on adjuvant TCH in near future.  All questions were answered. The patient knows to call the clinic with any problems, questions or concerns. We can certainly see the patient much sooner if necessary.  Patient and plan discussed with Dr. Ancil Linsey and she is in agreement with the aforementioned.   This note is electronically signed by: Doy Mince 01/22/2016 2:29 PM

## 2016-01-22 NOTE — Assessment & Plan Note (Addendum)
Stage IA (T1CN0M0) left invasive ductal carcinoma of upper outer quadrant, ER/PR negative, HER2 POSITIVE.  S/P simple left mastectomy by Dr. Georgette Dover on 01/07/2016.  Oncology history updated.  Staging in [facility] problem list is completed.  I personally reviewed and went over pathology results with the patient.  Pathology from left mastectomy demonstrates a 1.4 cm invasive ductal carcinoma with 0/2 lymph nodes resulting in a T1CN0 malignancy = Stage I.  I have reviewed the NCCN guidelines regarding her disease.  Following the NCCN algorithm for her disease provides 2 preferred regimens: AC followed by T + Herceptin versus TCH.  Given her cardiac history we have opted for [facility] therapy.  I reviewed the risks, benefits, alternatives, and side effects of this therapy and regimen, including, but not limited to, peripheral neuropathy, alopecia, allergic reaction, nail changes, fluid retention, stomatitis, diarrhea, nausea, vomiting, decreased blood counts, increased risk of infection, change in renal function, decreased LVEF, and death.  She has a port in place.  Order is placed for 2D Echo.  She has been referred to [facility].  This appt is tomorrow, 9/21.    She will be referred for chemotherapy teaching.   We will refer to Cardiology prior to start of chemotherapy at her request.  She would like to see someone locally.  She will return for day 1 of cycle 1 of treatment.  She will be seen 7-10 days post treatment for nadir check with labs and follow-up appointment.

## 2016-01-22 NOTE — Patient Instructions (Signed)
[place] at [facility] Discharge Instructions  RECOMMENDATIONS MADE BY THE CONSULTANT AND ANY TEST RESULTS WILL BE SENT TO YOUR REFERRING PHYSICIAN.  You were seen today by Kirby Crigler PA-C. He is sending referrals to cardiology and for chemo teaching.  Anderson Malta will make follow up appointments.  Thank you for choosing [place] at [facility] to provide your oncology and hematology care.  To afford each patient quality time with our provider, please arrive at least 15 minutes before your scheduled appointment time.   Beginning January 23rd 2017 lab work for the [facility] will be done in the  Main lab at [facility] on 1st floor. If you have a lab appointment with the [place] please come in thru the  Main Entrance and check in at the main information desk  You need to re-schedule your appointment should you arrive 10 or more minutes late.  We strive to give you quality time with our providers, and arriving late affects you and other patients whose appointments are after yours.  Also, if you no show three or more times for appointments you may be dismissed from the clinic at the providers discretion.     Again, thank you for choosing [facility].  Our hope is that these requests will decrease the amount of time that you wait before being seen by our physicians.       _____________________________________________________________  Should you have questions after your visit to [facility], please contact our office at [phone] between the hours of 8:30 a.m. and 4:30 p.m.  Voicemails left after 4:30 p.m. will not be returned until the following business day.  For prescription refill requests, have your pharmacy contact our office.         Resources For Cancer Patients and their Caregivers ? American Cancer Society: Can assist with transportation, wigs, general needs, runs Look Good Feel Better.         507-871-1062 ? Cancer Care: Provides financial assistance, online support groups, medication/co-pay assistance.  1-800-813-HOPE 941-858-5033) ? [place] Assists [place] cancer patients and their families through emotional , educational and financial support.  431-517-6428 ? [place] DSS Where to apply for food stamps, Medicaid and utility assistance. 951-159-1187 ? [facility]: Transportation to medical appointments. 727-217-9408 ? Social Security Administration: May apply for disability if have a Stage IV cancer. 678-821-5485 (431)469-9039 ? [employer], Disability and Transit Services: Assists with nutrition, care and transit needs. [place] Support Programs: @10RELATIVEDAYS @ > Cancer Support Group  2nd Tuesday of the month 1pm-2pm, Journey Room  > Creative Journey  3rd Tuesday of the month 1130am-1pm, Journey Room  > Look Good Feel Better  1st Wednesday of the month 10am-12 noon, Journey Room (Call [place] to register [phone])

## 2016-01-22 NOTE — Progress Notes (Signed)
START ON PATHWAY REGIMEN - Breast  BOS133: TCH - Docetaxel, Carboplatin With Concurrent Trastuzumab q21 Days x 6 Cycles, Followed by Trastuzumab  q21 Days x 11 Cycles  Docetaxel + Carboplatin + Trastuzumab [facility]):   A cycle is every 21 days:     Trastuzumab (Herceptin(R)) 8 mg/kg in 250 mL NS IV over 90 minutes as a loading dose first dose only. LVEF assessment recommended at baseline. Dose Mod: None     Trastuzumab (Herceptin(R)) 6 mg/kg in 250 mL NS IV over 30 minutes as a maintenance dose subsequent cycles. Recommended Monitoring: LVEF q3-4 months for adjuvant treatment, or with the development of cardiac symptoms and regimen changes for  metastatic treatment. Dose Mod: None     Docetaxel (Taxotere(R)) 75 mg/m2 in 250 mL NS IV over 1 hour followed by Dose Mod: None     Carboplatin (Paraplatin(R)) AUC=6 in 250 mL NS IV over 30-60 minutes day 1 only Dose Mod: None Additional Orders: Premedicate with dexamethasone 8 mg PO bid for three days beginning 1 day prior to therapy * All AUC calculations intended to be used in [place] formula.  **Always confirm dose/schedule in your pharmacy ordering system**    Trastuzumab (Maintenance Post TCH) x 11 Cycles:   A cycle is every 21 days:     Trastuzumab (Herceptin(R)) 6 mg/kg in 250 mL NS IV over 30 minutes, if tolerated previously. Dose Mod: None Additional Orders: Recommended Monitoring: LVEF q3-4 months for adjuvant treatment, or with the development of cardiac symptoms and regimen changes for metastatic treatment.  **Always confirm dose/schedule in your pharmacy ordering system**    Patient Characteristics: Adjuvant Therapy, Node Negative, HER2/neu Positive, ER Negative, Stage Ia and Ib, T1c AJCC Stage Grouping: IA Current Disease Status: No Distant Mets or Local Recurrence AJCC M Stage: 0 ER Status: Negative (-) AJCC N Stage: 0 AJCC T Stage: 1c HER2/neu: Positive (+) PR Status: Negative (-) Node Status: Negative (-)  Intent of  Therapy: Curative Intent, Discussed with Patient

## 2016-01-23 ENCOUNTER — Encounter (HOSPITAL_COMMUNITY): Payer: Self-pay | Admitting: Genetic Counselor

## 2016-01-23 ENCOUNTER — Encounter (HOSPITAL_BASED_OUTPATIENT_CLINIC_OR_DEPARTMENT_OTHER): Payer: BLUE CROSS/BLUE SHIELD | Admitting: Genetic Counselor

## 2016-01-23 ENCOUNTER — Other Ambulatory Visit (HOSPITAL_COMMUNITY): Payer: Self-pay | Admitting: Oncology

## 2016-01-23 ENCOUNTER — Telehealth (HOSPITAL_COMMUNITY): Payer: Self-pay | Admitting: Emergency Medicine

## 2016-01-23 DIAGNOSIS — C50412 Malignant neoplasm of upper-outer quadrant of left female breast: Secondary | ICD-10-CM | POA: Diagnosis not present

## 2016-01-23 DIAGNOSIS — Z803 Family history of malignant neoplasm of breast: Secondary | ICD-10-CM | POA: Insufficient documentation

## 2016-01-23 DIAGNOSIS — Z800 Family history of malignant neoplasm of digestive organs: Secondary | ICD-10-CM | POA: Insufficient documentation

## 2016-01-23 DIAGNOSIS — Z315 Encounter for genetic counseling: Secondary | ICD-10-CM | POA: Diagnosis not present

## 2016-01-23 MED ORDER — ONDANSETRON HCL 8 MG PO TABS: 8.0000 mg | ORAL_TABLET | Freq: Two times a day (BID) | ORAL | 1 refills | Status: DC | PRN

## 2016-01-23 MED ORDER — PROCHLORPERAZINE MALEATE 10 MG PO TABS: 10.0000 mg | ORAL_TABLET | Freq: Four times a day (QID) | ORAL | 1 refills | Status: DC | PRN

## 2016-01-23 MED ORDER — DEXAMETHASONE 4 MG PO TABS: 8.0000 mg | ORAL_TABLET | Freq: Two times a day (BID) | ORAL | 1 refills | Status: DC

## 2016-01-23 MED ORDER — LIDOCAINE-PRILOCAINE 2.5-2.5 % EX CREA: TOPICAL_CREAM | CUTANEOUS | 3 refills | Status: DC

## 2016-01-23 NOTE — Telephone Encounter (Signed)
Notified pt that I have called in some prescriptions to [facility] in [place] that she will need once she starts chemotherapy. We reviewed these mediations.  Pt notified of teaching appt, and the start date of the chemotherapy. Pt verbalized understanding.  Chemo teaching pulled together, labs entered, pre-meds sent to the pharmacy, chemo/doctors appts made.

## 2016-01-23 NOTE — Progress Notes (Signed)
REFERRING PROVIDER: Sharilyn Sites, MD [place], [place] [address]   Ancil Linsey, MD  PRIMARY PROVIDER:  Purvis Kilts, MD  PRIMARY REASON FOR VISIT:  1. Breast cancer of upper-outer quadrant of left female breast ([place])   2. Family history of breast cancer   3. Family history of colon cancer      HISTORY OF PRESENT ILLNESS:   [name], a 60 y.o. female, was seen for a [place] cancer genetics consultation at the request of Dr. Hilma Favors due to a personal and family history of cancer.  [name] presents to clinic today to discuss the possibility of a hereditary predisposition to cancer, genetic testing, and to further clarify her future cancer risks, as well as potential cancer risks for family members.   In 2001, at the age of 51, [name] was diagnosed with invasive ductal carcinoma of the left breast. This was treated with lumpectomy, radiation and tamoxifen.  In 2017, at the age of 35, [name] was diagnosed with invasive ductal carcinoma of the left breast.  The tumor is ER-/PR-/Her2+.  This will be treated with chemotherapy and unilateral mastectomy.      CANCER HISTORY:    Breast cancer of upper-outer quadrant of left female breast ([place])   11/07/1999 Surgery    Lumpectomy/sentinel LN biopsy invasive ductal carcinoma and DCIS at [facility] [place], 0/6 sentinel LN      11/10/1999 - 01/08/2005 Anti-estrogen oral therapy    Tamoxifen 20 mg daily      11/24/1999 - 01/02/2000 Radiation Therapy         12/18/2015 Mammogram    2D digital screening bilateral mammogram with CAD and adjunct TOMO, in the L breast a possible mass warrants further evaluation. In R breast no findings suspicious for malignancy      12/24/2015 Pathology Results    Invasive ductal carcinoma Grade 3 Left core needle biopsy 1:30 o clock ER- PR- Ki67 70%, HER 2 positive      12/24/2015 Imaging    L breast Ultrasound, notes palpable area of concern demonstrates a hypoechoic  irregular mass at 1:30, 7 cm from the nipple measuring 1.3 x 0.9 x 1.4 cm. No evidence of L axillary LAD      01/07/2016 Procedure    Left simple mastectomy by Dr. Georgette Dover      01/08/2016 Pathology Results    Breast, simple mastectomy, Left - INVASIVE DUCTAL CARCINOMA, GRADE III/III, SPANNING 1.4 CM. - THE SURGICAL RESECTION MARGINS ARE NEGATIVE FOR CARCINOMA.        HORMONAL RISK FACTORS:  Menarche was at age 58-15.  First live birth at age 43.  OCP use for approximately 0 years.  Ovaries intact: yes.  Hysterectomy: no.  Menopausal status: postmenopausal.  HRT use: 0 years. Colonoscopy: yes; 3 polyps. Mammogram within the last year: yes. Number of breast biopsies: 2. Up to date with pelvic exams:  yes. Any excessive radiation exposure in the past:  For cancer treatment  Past Medical History:  Diagnosis Date  . Anxiety   . Breast cancer ([place])   . Breast cancer of upper-outer quadrant of left female breast ([place]) 01/07/2016  . Complication of anesthesia   . DDD (degenerative disc disease), cervical   . DDD (degenerative disc disease), lumbar   . Depression   . Dysrhythmia    last cardiology ov 06-13-2013 in [facility], takes metoprolol for HR  . Family history of breast cancer   . Family history of colon cancer   .  Fibromyalgia   . GERD (gastroesophageal reflux disease)   . Hematuria   . History of echocardiogram 2015   mild mital insufficiency, otherwise normal  . Leaky heart valve   . Multiple thyroid nodules   . PAC (premature atrial contraction)   . Palpitations   . PONV (postoperative nausea and vomiting)   . SOB (shortness of breath)   . Tachycardia     Past Surgical History:  Procedure Laterality Date  . BREAST LUMPECTOMY    . LAPAROSCOPIC APPENDECTOMY N/A 11/10/2012   Procedure: APPENDECTOMY LAPAROSCOPIC;  Surgeon: Jamesetta So, MD;  Location: [facility];  Service: General;  Laterality: N/A;  . MASTECTOMY    . MASTECTOMY W/ SENTINEL NODE BIOPSY Left 01/07/2016  .  MASTECTOMY W/ SENTINEL NODE BIOPSY Left 01/07/2016   Procedure: LEFT TOTAL MASTECTOMY WITH LEFT AXILLARY SENTINEL LYMPH NODE BIOPSY;  Surgeon: Donnie Mesa, MD;  Location: [facility];  Service: General;  Laterality: Left;  . PORTACATH PLACEMENT Right 01/07/2016   IJ  . PORTACATH PLACEMENT Right 01/07/2016   Procedure: INSERTION PORT-A-CATH RIGHT INTERNAL JUGULAR WITH ULTRASOUND;  Surgeon: Donnie Mesa, MD;  Location: [facility];  Service: General;  Laterality: Right;    Social History   Social History  . Marital status: Married    Spouse name: N/A  . Number of children: 2  . Years of education: N/A   Social History Main Topics  . Smoking status: Never Smoker  . Smokeless tobacco: Never Used  . Alcohol use No  . Drug use: No  . Sexual activity: Yes     Comment: married   Other Topics Concern  . None   Social History Narrative  . None     FAMILY HISTORY:  We obtained a detailed, 4-generation family history.  Significant diagnoses are listed below: Family History  Problem Relation Age of Onset  . Cancer Mother     colon  . Diabetes Father   . Diabetes Sister   . Alcohol abuse Brother   . Cancer Brother 19    lung cancer  . Cancer Brother     penile  . Breast cancer Maternal Aunt   . Cancer Other     unknown form of cancer    The patient has two sons who are cancer free.  She has four full brothers, and three maternal half siblings, one sister and two brothers.  One full brother had penile cancer and he has a daughter with an unknown cancer, one full brother had lung cancer and a full brother has a daughter with an unknown cancer.  The patient's mother had colon cancer in her early 31's.  She had 8 siblings, one sister had breast cancer and another had another form of cancer.  The patient's parents separated when she was 43 and she does not know her paternal family.  Patient's maternal ancestors are of Caucasian descent, and paternal ancestors are of Caucasian descent. There is no  reported Ashkenazi Jewish ancestry. There is no known consanguinity.  GENETIC COUNSELING ASSESSMENT: [name] is a 60 y.o. female with a personal history of breast cancer and family history of breast, colon and other cancers which is somewhat suggestive of a hereditary cancer syndrome and predisposition to cancer. We, therefore, discussed and recommended the following at today's visit.   DISCUSSION: We discussed that about 5-10% of breast cancer is due to hereditary causes, with most cases due to BRCA mutations.  Other hereditary genes can also increase the risk for breast  cancer, including PALB2, ATM and CHEK2.  We reviewed the characteristics, features and inheritance patterns of hereditary cancer syndromes. We also discussed genetic testing, including the appropriate family members to test, the process of testing, insurance coverage and turn-around-time for results. We discussed the implications of a negative, positive and/or variant of uncertain significant result. We recommended [name] pursue genetic testing for the Breast/Ovarian cancer gene panel. The Breast/Ovarian gene panel offered by [facility] includes sequencing and rearrangement analysis for the following 20 genes:  ATM, BARD1, BRCA1, BRCA2, BRIP1, CDH1, CHEK2, EPCAM, FANCC, MLH1, MSH2, MSH6, NBN, PALB2, PMS2, PTEN, RAD51C, RAD51D, TP53, and XRCC2.     Based on [name]'s personal and family history of cancer, she meets medical criteria for genetic testing. Despite that she meets criteria, she may still have an out of pocket cost. We discussed that if her out of pocket cost for testing is over $100, the laboratory will call and confirm whether she wants to proceed with testing.  If the out of pocket cost of testing is less than $100 she will be billed by the genetic testing laboratory.   PLAN: Despite our recommendation, [name] did not wish to pursue genetic testing at today's visit. We understand this decision, and remain  available to coordinate genetic testing at any time in the future. We; therefore, recommend [name] continue to follow the cancer screening guidelines given by her primary healthcare provider.  Lastly, we encouraged [name] to remain in contact with cancer genetics annually so that we can continuously update the family history and inform her of any changes in cancer genetics and testing that may be of benefit for this family.   Ms.  Mervine questions were answered to her satisfaction today. Our contact information was provided should additional questions or concerns arise. Thank you for the referral and allowing [place] to share in the care of your patient.   [name] P. Florene Glen, [place], [facility] Certified Genetic Counselor [email or web address] phone: [phone]  The patient was seen for a total of 60 minutes in face-to-face genetic counseling.  This patient was discussed with Drs. Magrinat, Lindi Adie and/or Burr Medico who agrees with the above.    _______________________________________________________________________ For Office Staff:  Number of people involved in session: 2 Was an Intern/ student involved with case: no

## 2016-01-23 NOTE — Patient Instructions (Addendum)
[place]   CHEMOTHERAPY INSTRUCTIONS  Premeds: Aloxi - high powered nausea/vomiting prevention medication used for chemotherapy patients. Dexamethasone - steroid - given to reduce the risk of you having an allergic type reaction to the chemotherapy. Dex can cause you to feel energized, nervous/anxious/jittery, make you have trouble sleeping, and/or make you feel hot/flushed in the face/neck and/or look pink/red in the face/neck. These side effects will pass as the Dex wears off. (takes 20 minutes to infuse)  Neulasta - this medication is not chemo but being given because you have had chemo. It is usually given 24-27 hours after the completion of chemotherapy. This medication works by boosting your bone marrow's supply of white blood cells. White blood cells are what protect our bodies against infection. The medication is given in the form of a subcutaneous injection. It is given in the fatty tissue of your abdomen. It is a short needle. The major side effect of this medication is bone or muscle pain. The drug of choice to relieve or lessen the pain is Aleve or Ibuprofen. If a physician has ever told you not to take Aleve or Ibuprofen - then don't take it. You should then take Tylenol/acetaminophen. Take either medication as the bottle directs you to.  The level of pain you experience as a result of this injection can range from none, to mild or moderate, or severe. Please let us know if you develop moderate or severe bone pain.  You can also take Claritin 10 mg over the counter for about 3 days after you get neulasta.  This is said to help with bone pain. **DO NOT expose the Neulasta On-body injector to diagnostic imaging (CT scans, MRI, Ultrasound, X-ray), radiation treatment, or oxygen rich environments, such as hyperbaric chambers. **   SELF IMAGE NEEDS AND REFERRALS MADE: Information on look good feel better   SELF CARE ACTIVITIES WHILE ON CHEMOTHERAPY  Hydration  Increase  your fluid intake 48 hours prior to treatment and drink at least 8 to 12 cups (64 ounces) of water/decaff beverages per day after treatment. You can still have your cup of coffee or soda but these beverages do not count as part of your 8 to 12 cups that you need to drink daily. No alcohol intake.  Medications  Continue taking your normal prescription medication as prescribed.  If you start any new herbal or new supplements please let us know first to make sure it is safe.  Mouth Care  Have teeth cleaned professionally before starting treatment. Keep dentures and partial plates clean. Use soft toothbrush and do not use mouthwashes that contain alcohol. Biotene is a good mouthwash that is available at most pharmacies or may be ordered by calling [phone]. Use warm salt water gargles (1 teaspoon salt per 1 quart warm water) before and after meals and at bedtime. Or you may rinse with 2 tablespoons of three-percent hydrogen peroxide mixed in eight ounces of water. If you are still having problems with your mouth or sores in your mouth please call the clinic. If you need dental work, please let Dr. Whitney Muse know before you go for your appointment so that we can coordinate the best possible time for you in regards to your chemo regimen. You need to also let your dentist know that you are actively taking chemo. We may need to do labs prior to your dental appointment.   Skin Care  Always use sunscreen that has not expired and with SPF Nancy Fetter Protection Factor)  of 50 or higher. Wear hats to protect your head from the sun. Remember to use sunscreen on your hands, ears, face, & feet.  Use good moisturizing lotions such as udder cream, eucerin, or even Vaseline. Some chemotherapies can cause dry skin, color changes in your skin and nails.    . Avoid long, hot showers or baths. . Use gentle, fragrance-free soaps and laundry detergent. . Use moisturizers, preferably creams or ointments rather than lotions  because the thicker consistency is better at preventing skin dehydration. Apply the cream or ointment within 15 minutes of showering. Reapply moisturizer at night, and moisturize your hands every time after you wash them. Hair Loss (if your doctor says your hair will fall out)  . If your doctor says that your hair is likely to fall out, decide before you begin chemo whether you want to wear a wig. You may want to shop before treatment to match your hair color. . Hats, turbans, and scarves can also camouflage hair loss, although some people prefer to leave their heads uncovered. If you go bare-headed outdoors, be sure to use sunscreen on your scalp. . Cut your hair short. It eases the inconvenience of shedding lots of hair, but it also can reduce the emotional impact of watching your hair fall out. . Don't perm or color your hair during chemotherapy. Those chemical treatments are already damaging to hair and can enhance hair loss. Once your chemo treatments are done and your hair has grown back, it's OK to resume dyeing or perming hair. With chemotherapy, hair loss is almost always temporary. But when it grows back, it may be a different color or texture. In older adults who still had hair color before chemotherapy, the new growth may be completely gray.  Often, new hair is very fine and soft.  Infection Prevention  Please wash your hands for at least 30 seconds using warm soapy water. Handwashing is the #1 way to prevent the spread of germs. Stay away from sick people or people who are getting over a cold. If you develop respiratory systems such as green/yellow mucus production or productive cough or persistent cough let us know and we will see if you need an antibiotic. It is a good idea to keep a pair of gloves on when going into grocery stores/[employer] to decrease your risk of coming into contact with germs on the carts, etc. Carry alcohol hand gel with you at all times and use it frequently if out in  public. If your temperature reaches 100.5 or higher please call the clinic and let us know.  If it is after hours or on the weekend please go to the ER if your temperature is over 100.5.  Please have your own personal thermometer at home to use.    Sex and bodily fluids  If you are going to have sex, a condom must be used to protect the person that isn't taking chemotherapy. Chemo can decrease your libido (sex drive). For a few days after chemotherapy, chemotherapy can be excreted through your bodily fluids.  When using the toilet please close the lid and flush the toilet twice.  Do this for a few day after you have had chemotherapy.   Animals If you have cats or birds we just ask that you not change the litter or change the cage.  Please have someone else do this for you while you are on chemotherapy.   Food Safety During and After Cancer Treatment  Food safety is  important for people both during and after cancer treatment. Cancer and cancer treatments, such as chemotherapy, radiation therapy, and stem cell/bone marrow transplantation, often weaken the immune system. This makes it harder for your body to protect itself from foodborne illness, also called food poisoning. Foodborne illness is caused by eating food that contains harmful bacteria, parasites, or viruses. Foods to avoid Some foods have a higher risk of becoming tainted with bacteria. These include: Marland Kitchen Unwashed fresh fruit and vegetables, especially leafy vegetables that can hide dirt and other contaminants . Raw sprouts, such as alfalfa sprouts . Raw or undercooked beef, especially ground beef, or other raw or undercooked meat and poultry . Cold hot dogs or deli lunch meat (cold cuts), including dry-cured, uncooked salami. Always cook or reheat these foods until they are steaming hot. . Fatty, fried, or spicy foods immediately before or after treatment.  These can sit heavy on your stomach and make you feel nauseous. . Raw or  undercooked shellfish, such as oysters. . Sushi and sashimi, which often contain raw fish.  . Unpasteurized beverages, such as unpasteurized fruit juices, raw milk, raw yogurt, or cider . Soft cheeses made from unpasteurized milk, such as blue-veined (a type of blue cheese), Brie, Camembert, feta, goat cheese, and queso fresco or blanco . Undercooked eggs, such as soft boiled, over easy, and poached; raw, unpasteurized eggs; or foods made with raw egg, such as homemade raw cookie dough and homemade mayonnaise . Deli-prepared salads with egg, ham, chicken, or seafood Simple steps for food safety Shop smart. . Do not buy food stored or displayed in an unclean area. . Do not buy bruised or damaged fruits or vegetables. . Do not buy cans that have cracks, dents, or bulges. . Pick up foods that can spoil at the end of your shopping trip and store them in a cooler on the way home. Prepare and clean up foods carefully. . Rinse all fresh fruits and vegetables under running water, and dry them with a clean towel or paper towel. . Clean the top of cans before opening them. . After preparing food, wash your hands for 20 seconds with hot water and soap. Pay special attention to areas between fingers and under nails. . Clean your utensils and dishes with hot water and soap. Marland Kitchen Disinfect your kitchen and cutting boards using 1 teaspoon of liquid, unscented bleach mixed into 1 quart of water.   Prevent cross-contamination. Marland Kitchen Keep raw meat, poultry, and fish or their juices away from other food. Bacteria can spread through contact with the food or its liquid, causing cross-contamination. . Do not rinse raw meat or poultry because it can spread bacteria to nearby surfaces. Wendee Copp all items you used for preparing raw foods, including utensils, cutting board, and plates, before using them for other foods or cooked meat. . Set aside a specific cutting board for preparing uncooked meat, fish, and chicken. Never use  it for uncooked fruits, vegetables, or other foods. Dispose of old food. . Eat canned and packaged food before its expiration date (the "use by" or "best before" date). . Consume refrigerated leftovers within 3 to 4 days. After that time, throw out the food. Even if the food does not smell or look spoiled, it still may be unsafe. Some bacteria, such as Listeria, can grow even on foods stored in the refrigerator if they are kept for too long. Take precautions when eating out. . At restaurants, avoid buffets and salad bars where food sits  out for a long time and comes in contact with many people. Food can become contaminated when someone with a virus, often a norovirus, or another "bug" handles it. . Put any leftover food in a "to-go" container yourself, rather than having the server do it. And, refrigerate leftovers as soon as you get home. . Choose restaurants that are clean and that are willing to prepare your food as you order it cooked. Cook food to the right temperature. Use a food thermometer to check for a safe internal temperature of all poultry and meat. For instance, a hamburger should be cooked to at least medium (160?F or 71?C). Get a full list of recommended internal cooking temperatures on the website of the U.S. Food and Drug Administration (FDA).  Chill food promptly. Refrigerate or freeze perishable food within 2 hours of cooking or buying it (sooner in warm weather.) Proper cooking destroys bacteria, but they can still grow on cooked food that is left out too long. Food stored in the refrigerator should be kept at below 40?F (4?C). And, food stored in the freezer should be kept below 32?F (0?C). Thaw food properly. Thaw frozen food in the refrigerator rather than at room temperature. You can also thaw food in frequently changed cold water or in the microwave, but cook it as soon as it thaws.    MEDICATIONS:  Dexamethasone 73m tablet. Take 2 tablets (8 mg total) by mouth 2 (two)  times daily. Start the day before Taxotere. Then again the day after chemo for 3 days.                                                                                                                                                                Zofran/Ondansetron 850mtablet. Take 1 tablet every 8 hours as needed for nausea/vomiting. (#1 nausea med to take, this can constipate)  Compazine/Prochlorperazine 1037mablet. Take 1 tablet every 6 hours as needed for nausea/vomiting. (#2 nausea med to take, this can make you sleepy)   EMLA cream. Apply a quarter size amount to port site 1 hour prior to chemo. Do not rub in. Cover with plastic wrap.   Over-the-Counter Meds:  Miralax 1 capful in 8 oz of fluid daily. May increase to two times a day if needed. This is a stool softener. If this doesn't work proceed you can add:  Senokot S  - start with 1 tablet two times a day and increase to 4 tablets two times a day if needed. (total of 8 tablets in a 24 hour period). This is a stimulant laxative.   Call us [place] this does not help your bowels move.   Imodium 2mg86mpsule. Take 2 capsules after the 1st loose stool and then 1 capsule every 2 hours until  you go a total of 12 hours without having a loose stool. Call the [place] if loose stools continue. If diarrhea occurs @ bedtime, take 2 capsules @ bedtime. Then take 2 capsules every 4 hours until morning. Call [place].      SYMPTOMS TO REPORT AS SOON AS POSSIBLE AFTER TREATMENT:  FEVER GREATER THAN 100.5 F  CHILLS WITH OR WITHOUT FEVER  NAUSEA AND VOMITING THAT IS NOT CONTROLLED WITH YOUR NAUSEA MEDICATION  UNUSUAL SHORTNESS OF BREATH  UNUSUAL BRUISING OR BLEEDING  TENDERNESS IN MOUTH AND THROAT WITH OR WITHOUT PRESENCE OF ULCERS  URINARY PROBLEMS  BOWEL PROBLEMS  UNUSUAL RASH    Wear comfortable clothing and clothing appropriate for easy access to any Portacath or PICC line. Let us know if there is anything that we can do  to make your therapy better!      I have been informed and understand all of the instructions given to me and have received a copy. I have been instructed to call the clinic [phone]  or my family physician as soon as possible for continued medical care, if indicated. I do not have any more questions at this time but understand that I may call the [place] at [phone] during office hours should I have questions or need assistance in obtaining follow-up care.     Trastuzumab (Generic Name) Other Names: Herceptin, anti-HER2 monoclonal antibody This patient information was developed by the [facility]).  2017 and can be found on the [facility] web site at [email or web address] This information is not intended to be used as a substitute for professional medical advice, diagnosis, or treatment. You should not rely entirely on this information for your health care needs. Ask your own doctor or health care provider any specific medical questions that you have. CLIENT acknowledges that the [employer] and Via Pathways Portal are information management tools only, and that [place] has not represented the Via Pathways or Via Pathways Portal as having the ability to diagnose disease, prescribe treatment, or perform any other tasks that constitute the practice of medicine. The clinical information contained in the Via Pathways and Via Pathways Portal are intended as a supplement to, and not a substitute for, the knowledge, expertise, skill, and judgment of physicians, pharmacists and other healthcare professionals involved with patient care at [place] facilities. About This Drug Trastuzumab is a special type of antibody used to treat cancer. It is given in the vein (IV). Possible Side Effects . Loose bowel movements (diarrhea) . Headache . Rash . Decreased appetite . Nausea and throwing up (vomiting) . Fever and/or chills. During the first dose, you may get a fever and/or  chills. This is a temporary reaction to the drug. Your doctor may give you medicines to help lessen these side effects. . Allergic reactions . Pain. Some patients experience abdominal pain, back pain, pain at the injection site, and/or pain at the site of the tumor. It is very important to tell your doctor or nurse about your pain. . Changes in the tissue of the heart. Some changes may happen that can cause your heart to have less ability to pump blood. Your heart function will be checked as needed. . Skin and tissue irritation that may involve redness, pain, warmth, or swelling at the IV site. This happens if the drug leaks out of the vein and into nearby tissue. Infusion Reactions While you are getting this drug in your vein (IV), you may have  a reaction. Your nurse will check you closely for these signs: fever or shaking chills, flushing, facial swelling, feeling dizzy, headache, trouble breathing, rash, itching, chest tightness, or chest pain. Tell your nurse or doctor if you have any of these symptoms while you are getting the drug. Delayed Allergic Reaction There is a possibility of a severe delayed reaction, which can happen hours to days after this drug is given. This reaction most often happens after the first dose, but can happen after any dose. At the first sign of trouble breathing, or if you have pain in your chest, get emergency care right away. Treating Side Effects Trastuzumab (Generic Name) continued Page 2 of 3 This patient information was developed by the [facility]).  2017 and can be found on the [facility] web site at [email or web address] This information is not intended to be used as a substitute for professional medical advice, diagnosis, or treatment. You should not rely entirely on this information for your health care needs. Ask your own doctor or health care provider any specific medical questions that you have. CLIENT acknowledges that the Via  Pathways and Via Pathways Portal are information management tools only, and that [place] has not represented the Via Pathways or Via Pathways Portal as having the ability to diagnose disease, prescribe treatment, or perform any other tasks that constitute the practice of medicine. The clinical information contained in the Via Pathways and Via Pathways Portal are intended as a supplement to, and not a substitute for, the knowledge, expertise, skill, and judgment of physicians, pharmacists and other healthcare professionals involved with patient care at [place] facilities. . Ask your doctor or nurse about medicine to help stop or lessen nausea and throwing up. . If you get a rash, do not put anything on it unless your doctor or nurse says you may. Keep the area around the rash clean and dry. Ask your doctor for medicine if your rash bothers you. . Use effective methods of birth control during your cancer treatment. . Tell your nurse right away if you have any pain, redness, or swelling at the site of the IV infusion while you are getting this drug. Food and Drug Interactions There are no known interactions of trastuzumab with any food. This drug may interact with other medicines. Tell your doctor and pharmacist about all the medicines and dietary supplements (vitamins, minerals, herbs and others) that you are taking at this time. The safety and use of dietary supplements and alternative diets are often not known. Using these might affect your cancer or interfere with your treatment. Until more is known, you should not use dietary supplements or alternative diets without your cancer doctor's help. When to Call the Doctor Call your doctor or nurse right away if you have any of these symptoms: . Trouble catching your breath . Chest pain . Trouble breathing when lying flat . Increased cough . Feeling that your heart is beating in a fast or not normal way (palpitations) . Swelling in your  ankles . Temperature of 100.5 F (38 C) or higher . Chills . Throwing up more than three times in one day . Nausea that stops you from eating or drinking Call your doctor or nurse as soon as possible if you have any of these symptoms: . Loose bowel movements (diarrhea) more than five to six times in one day or diarrhea with weakness or feeling lightheaded . Nausea and/or throwing up that is not relieved by prescribed  medicines . Pain that is not relieved by prescribed medicines . Headache that is not relieved by prescribed medicines . Abdominal pain . Lasting loss of appetite or rapid weight loss (5 pounds in one week) Trastuzumab (Generic Name) continued Page 3 of 3 This patient information was developed by the [facility]).  2017 and can be found on the [facility] web site at [email or web address] This information is not intended to be used as a substitute for professional medical advice, diagnosis, or treatment. You should not rely entirely on this information for your health care needs. Ask your own doctor or health care provider any specific medical questions that you have. CLIENT acknowledges that the Via Pathways and Via Pathways Portal are information management tools only, and that [place] has not represented the Via Pathways or Via Pathways Portal as having the ability to diagnose disease, prescribe treatment, or perform any other tasks that constitute the practice of medicine. The clinical information contained in the Via Pathways and Via Pathways Portal are intended as a supplement to, and not a substitute for, the knowledge, expertise, skill, and judgment of physicians, pharmacists and other healthcare professionals involved with patient care at [place] facilities. . Rash that bothers you Reproduction Concerns . Pregnancy warning: This drug may have harmful effects on the unborn child, so effective methods of birth control should be used during your  cancer treatment. . Genetic counseling is available for you to talk about the effects of this drug therapy on future pregnancies. Also, a genetic counselor can look at the possible risk of problems in the unborn baby due to this medicine if an exposure happens during pregnancy. . Breast feeding warning: It is not known if this drug passes into breast milk. For this reason, women should talk to their doctor about the risks and benefits of breast feeding during treatment with this drug because this drug may enter the breast milk and badly harm a breast feeding baby. Revised July    Carboplatin injection What is this medicine? CARBOPLATIN (KAR boe pla tin) is a chemotherapy drug. It targets fast dividing cells, like cancer cells, and causes these cells to die. This medicine is used to treat ovarian cancer and many other cancers. This medicine may be used for other purposes; ask your health care provider or pharmacist if you have questions. What should I tell my health care provider before I take this medicine? They need to know if you have any of these conditions: -blood disorders -hearing problems -kidney disease -recent or ongoing radiation therapy -an unusual or allergic reaction to carboplatin, cisplatin, other chemotherapy, other medicines, foods, dyes, or preservatives -pregnant or trying to get pregnant -breast-feeding How should I use this medicine? This drug is usually given as an infusion into a vein. It is administered in a hospital or clinic by a specially trained health care professional. Talk to your pediatrician regarding the use of this medicine in children. Special care may be needed. Overdosage: If you think you have taken too much of this medicine contact a poison control center or emergency room at once. NOTE: This medicine is only for you. Do not share this medicine with others. What if I miss a dose? It is important not to miss a dose. Call your doctor or health care  professional if you are unable to keep an appointment. What may interact with this medicine? -medicines for seizures -medicines to increase blood counts like filgrastim, pegfilgrastim, sargramostim -some antibiotics like amikacin,  gentamicin, neomycin, streptomycin, tobramycin -vaccines Talk to your doctor or health care professional before taking any of these medicines: -acetaminophen -aspirin -ibuprofen -ketoprofen -naproxen This list may not describe all possible interactions. Give your health care provider a list of all the medicines, herbs, non-prescription drugs, or dietary supplements you use. Also tell them if you smoke, drink alcohol, or use illegal drugs. Some items may interact with your medicine. What should I watch for while using this medicine? Your condition will be monitored carefully while you are receiving this medicine. You will need important blood work done while you are taking this medicine. This drug may make you feel generally unwell. This is not uncommon, as chemotherapy can affect healthy cells as well as cancer cells. Report any side effects. Continue your course of treatment even though you feel ill unless your doctor tells you to stop. In some cases, you may be given additional medicines to help with side effects. Follow all directions for their use. Call your doctor or health care professional for advice if you get a fever, chills or sore throat, or other symptoms of a cold or flu. Do not treat yourself. This drug decreases your body's ability to fight infections. Try to avoid being around people who are sick. This medicine may increase your risk to bruise or bleed. Call your doctor or health care professional if you notice any unusual bleeding. Be careful brushing and flossing your teeth or using a toothpick because you may get an infection or bleed more easily. If you have any dental work done, tell your dentist you are receiving this medicine. Avoid taking  products that contain aspirin, acetaminophen, ibuprofen, naproxen, or ketoprofen unless instructed by your doctor. These medicines may hide a fever. Do not become pregnant while taking this medicine. Women should inform their doctor if they wish to become pregnant or think they might be pregnant. There is a potential for serious side effects to an unborn child. Talk to your health care professional or pharmacist for more information. Do not breast-feed an infant while taking this medicine. What side effects may I notice from receiving this medicine? Side effects that you should report to your doctor or health care professional as soon as possible: -allergic reactions like skin rash, itching or hives, swelling of the face, lips, or tongue -signs of infection - fever or chills, cough, sore throat, pain or difficulty passing urine -signs of decreased platelets or bleeding - bruising, pinpoint red spots on the skin, black, tarry stools, nosebleeds -signs of decreased red blood cells - unusually weak or tired, fainting spells, lightheadedness -breathing problems -changes in hearing -changes in vision -chest pain -high blood pressure -low blood counts - This drug may decrease the number of white blood cells, red blood cells and platelets. You may be at increased risk for infections and bleeding. -nausea and vomiting -pain, swelling, redness or irritation at the injection site -pain, tingling, numbness in the hands or feet -problems with balance, talking, walking -trouble passing urine or change in the amount of urine Side effects that usually do not require medical attention (report to your doctor or health care professional if they continue or are bothersome): -hair loss -loss of appetite -metallic taste in the mouth or changes in taste This list may not describe all possible side effects. Call your doctor for medical advice about side effects. You may report side effects to FDA at  1-800-FDA-1088. Where should I keep my medicine? This drug is given in a hospital or  clinic and will not be stored at home. NOTE: This sheet is a summary. It may not cover all possible information. If you have questions about this medicine, talk to your doctor, pharmacist, or health care provider.    2016, Elsevier/Gold Standard. (2007-07-26 14:38:05)   Docetaxel injection What is this medicine? DOCETAXEL (doe se TAX el) is a chemotherapy drug. It targets fast dividing cells, like cancer cells, and causes these cells to die. This medicine is used to treat many types of cancers like breast cancer, certain stomach cancers, head and neck cancer, lung cancer, and prostate cancer. This medicine may be used for other purposes; ask your health care provider or pharmacist if you have questions. What should I tell my health care provider before I take this medicine? They need to know if you have any of these conditions: -infection (especially a virus infection such as chickenpox, cold sores, or herpes) -liver disease -low blood counts, like low white cell, platelet, or red cell counts -an unusual or allergic reaction to docetaxel, polysorbate 80, other chemotherapy agents, other medicines, foods, dyes, or preservatives -pregnant or trying to get pregnant -breast-feeding How should I use this medicine? This drug is given as an infusion into a vein. It is administered in a hospital or clinic by a specially trained health care professional. Talk to your pediatrician regarding the use of this medicine in children. Special care may be needed. Overdosage: If you think you have taken too much of this medicine contact a poison control center or emergency room at once. NOTE: This medicine is only for you. Do not share this medicine with others. What if I miss a dose? It is important not to miss your dose. Call your doctor or health care professional if you are unable to keep an appointment. What may  interact with this medicine? -cyclosporine -erythromycin -ketoconazole -medicines to increase blood counts like filgrastim, pegfilgrastim, sargramostim -vaccines Talk to your doctor or health care professional before taking any of these medicines: -acetaminophen -aspirin -ibuprofen -ketoprofen -naproxen This list may not describe all possible interactions. Give your health care provider a list of all the medicines, herbs, non-prescription drugs, or dietary supplements you use. Also tell them if you smoke, drink alcohol, or use illegal drugs. Some items may interact with your medicine. What should I watch for while using this medicine? Your condition will be monitored carefully while you are receiving this medicine. You will need important blood work done while you are taking this medicine. This drug may make you feel generally unwell. This is not uncommon, as chemotherapy can affect healthy cells as well as cancer cells. Report any side effects. Continue your course of treatment even though you feel ill unless your doctor tells you to stop. In some cases, you may be given additional medicines to help with side effects. Follow all directions for their use. Call your doctor or health care professional for advice if you get a fever, chills or sore throat, or other symptoms of a cold or flu. Do not treat yourself. This drug decreases your body's ability to fight infections. Try to avoid being around people who are sick. This medicine may increase your risk to bruise or bleed. Call your doctor or health care professional if you notice any unusual bleeding. This medicine may contain alcohol in the product. You may get drowsy or dizzy. Do not drive, use machinery, or do anything that needs mental alertness until you know how this medicine affects you. Do not stand or  sit up quickly, especially if you are an older patient. This reduces the risk of dizzy or fainting spells. Avoid alcoholic drinks. Do not  become pregnant while taking this medicine. Women should inform their doctor if they wish to become pregnant or think they might be pregnant. There is a potential for serious side effects to an unborn child. Talk to your health care professional or pharmacist for more information. Do not breast-feed an infant while taking this medicine. What side effects may I notice from receiving this medicine? Side effects that you should report to your doctor or health care professional as soon as possible: -allergic reactions like skin rash, itching or hives, swelling of the face, lips, or tongue -low blood counts - This drug may decrease the number of white blood cells, red blood cells and platelets. You may be at increased risk for infections and bleeding. -signs of infection - fever or chills, cough, sore throat, pain or difficulty passing urine -signs of decreased platelets or bleeding - bruising, pinpoint red spots on the skin, black, tarry stools, nosebleeds -signs of decreased red blood cells - unusually weak or tired, fainting spells, lightheadedness -breathing problems -fast or irregular heartbeat -low blood pressure -mouth sores -nausea and vomiting -pain, swelling, redness or irritation at the injection site -pain, tingling, numbness in the hands or feet -swelling of the ankle, feet, hands -weight gain Side effects that usually do not require medical attention (report to your prescriber or health care professional if they continue or are bothersome): -bone pain -complete hair loss including hair on your head, underarms, pubic hair, eyebrows, and eyelashes -diarrhea -excessive tearing -changes in the color of fingernails -loosening of the fingernails -nausea -muscle pain -red flush to skin -sweating -weak or tired This list may not describe all possible side effects. Call your doctor for medical advice about side effects. You may report side effects to FDA at 1-800-FDA-1088. Where should  I keep my medicine? This drug is given in a hospital or clinic and will not be stored at home. NOTE: This sheet is a summary. It may not cover all possible information. If you have questions about this medicine, talk to your doctor, pharmacist, or health care provider.    2016, Elsevier/Gold Standard. (2014-05-07 16:04:57)

## 2016-01-28 ENCOUNTER — Ambulatory Visit (HOSPITAL_COMMUNITY)
Admission: RE | Admit: 2016-01-28 | Discharge: 2016-01-28 | Disposition: A | Discharge status: Home / Self Care | Payer: BLUE CROSS/BLUE SHIELD | Source: Ambulatory Visit | Attending: Oncology | Admitting: Oncology

## 2016-01-28 DIAGNOSIS — C50412 Malignant neoplasm of upper-outer quadrant of left female breast: Secondary | ICD-10-CM | POA: Insufficient documentation

## 2016-01-28 DIAGNOSIS — I509 Heart failure, unspecified: Secondary | ICD-10-CM | POA: Insufficient documentation

## 2016-01-28 NOTE — Progress Notes (Signed)
*  PRELIMINARY RESULTS* Echocardiogram 2D Echocardiogram has been performed.  Samuel Germany 01/28/2016, 11:25 AM

## 2016-01-30 ENCOUNTER — Encounter (HOSPITAL_COMMUNITY): Payer: BLUE CROSS/BLUE SHIELD

## 2016-01-30 DIAGNOSIS — C50412 Malignant neoplasm of upper-outer quadrant of left female breast: Secondary | ICD-10-CM

## 2016-01-31 ENCOUNTER — Encounter (HOSPITAL_BASED_OUTPATIENT_CLINIC_OR_DEPARTMENT_OTHER): Payer: BLUE CROSS/BLUE SHIELD

## 2016-01-31 VITALS — BP 121/66 | HR 87 | Temp 98.0°F | Resp 16 | Wt 163.7 lb

## 2016-01-31 DIAGNOSIS — Z5111 Encounter for antineoplastic chemotherapy: Secondary | ICD-10-CM | POA: Diagnosis not present

## 2016-01-31 DIAGNOSIS — C50412 Malignant neoplasm of upper-outer quadrant of left female breast: Secondary | ICD-10-CM | POA: Diagnosis not present

## 2016-01-31 DIAGNOSIS — Z5189 Encounter for other specified aftercare: Secondary | ICD-10-CM | POA: Diagnosis not present

## 2016-01-31 DIAGNOSIS — Z5112 Encounter for antineoplastic immunotherapy: Secondary | ICD-10-CM

## 2016-01-31 LAB — CBC WITH DIFFERENTIAL/PLATELET
BASOS ABS: 0 10*3/uL (ref 0.0–0.1)
BASOS PCT: 0 %
Eosinophils Absolute: 0 10*3/uL (ref 0.0–0.7)
Eosinophils Relative: 0 %
HEMATOCRIT: 42.1 % (ref 36.0–46.0)
HEMOGLOBIN: 14.1 g/dL (ref 12.0–15.0)
LYMPHS ABS: 2 10*3/uL (ref 0.7–4.0)
LYMPHS PCT: 15 %
MCH: 31.5 pg (ref 26.0–34.0)
MCHC: 33.5 g/dL (ref 30.0–36.0)
MCV: 94.2 fL (ref 78.0–100.0)
MONOS PCT: 4 %
Monocytes Absolute: 0.6 10*3/uL (ref 0.1–1.0)
NEUTROS PCT: 81 %
Neutro Abs: 11.1 10*3/uL — ABNORMAL HIGH (ref 1.7–7.7)
Platelets: 299 10*3/uL (ref 150–400)
RBC: 4.47 MIL/uL (ref 3.87–5.11)
RDW: 12.5 % (ref 11.5–15.5)
WBC: 13.7 10*3/uL — AB (ref 4.0–10.5)

## 2016-01-31 LAB — COMPREHENSIVE METABOLIC PANEL
ALBUMIN: 4 g/dL (ref 3.5–5.0)
ALK PHOS: 77 U/L (ref 38–126)
ALT: 29 U/L (ref 14–54)
AST: 27 U/L (ref 15–41)
Anion gap: 8 (ref 5–15)
BILIRUBIN TOTAL: 0.5 mg/dL (ref 0.3–1.2)
BUN: 16 mg/dL (ref 6–20)
CALCIUM: 9.5 mg/dL (ref 8.9–10.3)
CO2: 22 mmol/L (ref 22–32)
Chloride: 105 mmol/L (ref 101–111)
Creatinine, Ser: 0.76 mg/dL (ref 0.44–1.00)
GFR calc Af Amer: >60 mL/min (ref >60)
GFR calc non Af Amer: >60 mL/min (ref >60)
GLUCOSE: 131 mg/dL — AB (ref 65–99)
POTASSIUM: 3.6 mmol/L (ref 3.5–5.1)
Sodium: 135 mmol/L (ref 135–145)
TOTAL PROTEIN: 7.6 g/dL (ref 6.5–8.1)

## 2016-01-31 MED ORDER — HEPARIN SOD (PORK) LOCK FLUSH 100 UNIT/ML IV SOLN
500.0000 [IU] | Freq: Once | INTRAVENOUS | Status: AC | PRN
  Administered 2016-01-31: 500 [IU]
  Filled 2016-01-31: qty 5

## 2016-01-31 MED ORDER — TRASTUZUMAB CHEMO 150 MG IV SOLR
8.0000 mg/kg | Freq: Once | INTRAVENOUS | Status: AC
  Administered 2016-01-31: 588 mg via INTRAVENOUS
  Filled 2016-01-31: qty 28

## 2016-01-31 MED ORDER — PEGFILGRASTIM 6 MG/0.6ML ~~LOC~~ PSKT
6.0000 mg | PREFILLED_SYRINGE | Freq: Once | SUBCUTANEOUS | Status: AC
  Administered 2016-01-31: 6 mg via SUBCUTANEOUS
  Filled 2016-01-31: qty 0.6

## 2016-01-31 MED ORDER — DIPHENHYDRAMINE HCL 25 MG PO CAPS
50.0000 mg | ORAL_CAPSULE | Freq: Once | ORAL | Status: AC
  Administered 2016-01-31: 50 mg via ORAL
  Filled 2016-01-31: qty 2

## 2016-01-31 MED ORDER — SODIUM CHLORIDE 0.9 % IV SOLN
Freq: Once | INTRAVENOUS | Status: AC
  Administered 2016-01-31: 09:00:00 via INTRAVENOUS

## 2016-01-31 MED ORDER — PALONOSETRON HCL INJECTION 0.25 MG/5ML
0.2500 mg | Freq: Once | INTRAVENOUS | Status: AC
  Administered 2016-01-31: 0.25 mg via INTRAVENOUS
  Filled 2016-01-31: qty 5

## 2016-01-31 MED ORDER — SODIUM CHLORIDE 0.9 % IV SOLN
558.5000 mg | Freq: Once | INTRAVENOUS | Status: AC
  Administered 2016-01-31: 560 mg via INTRAVENOUS
  Filled 2016-01-31: qty 56

## 2016-01-31 MED ORDER — DOCETAXEL CHEMO INJECTION 160 MG/16ML
75.0000 mg/m2 | Freq: Once | INTRAVENOUS | Status: AC
Start: 2016-01-31 — End: 2016-01-31
  Administered 2016-01-31: 140 mg via INTRAVENOUS
  Filled 2016-01-31: qty 14

## 2016-01-31 MED ORDER — ACETAMINOPHEN 325 MG PO TABS
650.0000 mg | ORAL_TABLET | Freq: Once | ORAL | Status: AC
  Administered 2016-01-31: 650 mg via ORAL
  Filled 2016-01-31: qty 2

## 2016-01-31 MED ORDER — SODIUM CHLORIDE 0.9% FLUSH
10.0000 mL | INTRAVENOUS | Status: DC | PRN
  Administered 2016-01-31: 10 mL
  Filled 2016-01-31: qty 10

## 2016-01-31 MED ORDER — SODIUM CHLORIDE 0.9 % IV SOLN
10.0000 mg | Freq: Once | INTRAVENOUS | Status: AC
  Administered 2016-01-31: 10 mg via INTRAVENOUS
  Filled 2016-01-31: qty 1

## 2016-01-31 NOTE — Patient Instructions (Signed)
[facility] Discharge Instructions for Patients Receiving Chemotherapy   Beginning January 23rd 2017 lab work for the [facility] will be done in the  Main lab at [facility] on 1st floor. If you have a lab appointment with the [facility] please come in thru the  Main Entrance and check in at the main information desk   Today you received the following chemotherapy agents Taxotere, carboplatin and herceptin.  To help prevent nausea and vomiting after your treatment, we encourage you to take your nausea medication as instructed,  Neulasta OnPro will dispense medication between 530 pm  and 630 pm tomorrow Saturday Sept 30. You may take device off after 630 pm tomorrow.   If you develop nausea and vomiting, or diarrhea that is not controlled by your medication, call the clinic.  The clinic phone number is [phone]. Office hours are Monday-Friday 8:30am-5:00pm.  BELOW ARE SYMPTOMS THAT SHOULD BE REPORTED IMMEDIATELY:  *FEVER GREATER THAN 101.0 F  *CHILLS WITH OR WITHOUT FEVER  NAUSEA AND VOMITING THAT IS NOT CONTROLLED WITH YOUR NAUSEA MEDICATION  *UNUSUAL SHORTNESS OF BREATH  *UNUSUAL BRUISING OR BLEEDING  TENDERNESS IN MOUTH AND THROAT WITH OR WITHOUT PRESENCE OF ULCERS  *URINARY PROBLEMS  *BOWEL PROBLEMS  UNUSUAL RASH Items with * indicate a potential emergency and should be followed up as soon as possible. If you have an emergency after office hours please contact your primary care physician or go to the nearest emergency department.  Please call the clinic during office hours if you have any questions or concerns.   You may also contact the Patient Navigator at [phone] should you have any questions or need assistance in obtaining follow up care.      Resources For Cancer Patients and their Caregivers ? American Cancer Society: Can assist with transportation, wigs, general needs, runs Look Good Feel Better.         (579)250-2917 ? Cancer Care: Provides financial assistance, online support groups, medication/co-pay assistance.  1-800-813-HOPE 540-045-8914) ? [facility] Assists [place] cancer patients and their families through emotional , educational and financial support.  (210) 336-9694 ? [place] DSS Where to apply for food stamps, Medicaid and utility assistance. (540)518-9407 ? [facility]: Transportation to medical appointments. (314)279-0126 ? Social Security Administration: May apply for disability if have a Stage IV cancer. 818-873-2762 303-753-6742 ? [employer], Disability and Transit Services: Assists with nutrition, care and transit needs. ([phone] [phone]

## 2016-01-31 NOTE — Progress Notes (Signed)
Tolerated chemo well. Marland KitchenRosalin Hawking Dech arrived today for [facility] neulasta on body injector. See MAR for administration details. Injector in place and engaged with green light indicator on flashing. Tolerated application with out problems.Stable and ambulatory on discharge home with husband.

## 2016-01-31 NOTE — Progress Notes (Signed)
1239- Patient complained of feeling light headed. Patient resting in bed. Vitals taken and are stable.

## 2016-02-03 ENCOUNTER — Telehealth (HOSPITAL_COMMUNITY): Payer: Self-pay

## 2016-02-03 NOTE — Telephone Encounter (Signed)
24 hour follow up-patient complaining of constipation, reflux. LBM sept the 28th. Encouraged her to follow the constipation sheet, take tums. Will call her later today to check on her. Will send an inbasket to [employer] PA-C about reflux.

## 2016-02-03 NOTE — Telephone Encounter (Signed)
Called patient back to check on her. She has had a small BM since I talked to her this morning. Encouraged her to continue the regimen she is taking. Patient understood.

## 2016-02-04 ENCOUNTER — Other Ambulatory Visit (HOSPITAL_COMMUNITY): Payer: Self-pay

## 2016-02-04 ENCOUNTER — Telehealth (HOSPITAL_COMMUNITY): Payer: Self-pay

## 2016-02-04 NOTE — Telephone Encounter (Signed)
Called patient back to tell her she could take 2 prilosec a day. Also called in magic mouthwash for her because she complained of sores in her mouth.

## 2016-02-05 ENCOUNTER — Inpatient Hospital Stay (HOSPITAL_COMMUNITY): Payer: BLUE CROSS/BLUE SHIELD

## 2016-02-11 ENCOUNTER — Encounter (HOSPITAL_COMMUNITY): Payer: BLUE CROSS/BLUE SHIELD | Attending: Hematology & Oncology | Admitting: Hematology & Oncology

## 2016-02-11 ENCOUNTER — Encounter (HOSPITAL_COMMUNITY): Payer: Self-pay | Admitting: Hematology & Oncology

## 2016-02-11 VITALS — BP 131/73 | HR 94 | Temp 98.7°F | Resp 16 | Wt 163.6 lb

## 2016-02-11 DIAGNOSIS — C50412 Malignant neoplasm of upper-outer quadrant of left female breast: Secondary | ICD-10-CM | POA: Diagnosis not present

## 2016-02-11 DIAGNOSIS — F418 Other specified anxiety disorders: Secondary | ICD-10-CM

## 2016-02-11 DIAGNOSIS — K219 Gastro-esophageal reflux disease without esophagitis: Secondary | ICD-10-CM | POA: Diagnosis not present

## 2016-02-11 DIAGNOSIS — Z171 Estrogen receptor negative status [ER-]: Secondary | ICD-10-CM

## 2016-02-11 MED ORDER — OMEPRAZOLE 40 MG PO CPDR: 40.0000 mg | DELAYED_RELEASE_CAPSULE | Freq: Two times a day (BID) | ORAL | 2 refills | Status: DC

## 2016-02-11 MED ORDER — FAMOTIDINE 20 MG PO TABS: 20.0000 mg | ORAL_TABLET | Freq: Two times a day (BID) | ORAL | 2 refills | Status: DC

## 2016-02-11 NOTE — Progress Notes (Signed)
[place] NOTE  Patient Care Team: Katherine Sites, MD as PCP - General (Family Medicine)  CHIEF COMPLAINTS/PURPOSE OF CONSULTATION:  L breast carcinoma ER- PR- HER 2 +  HISTORY OF PRESENTING ILLNESS:  [name] 60 y.o. female is here because of newly diagnosed ER- PR- HER 2 + L breast cancer.  She experiences constipation and indigestion a few days after chemotherapy. She currently takes 20 mg Prilosec for heartburn. She tried Miralax for constipation, but that led to diarrhea.   Her face/chin burns and is red, which she says may be from steroids. Benadryl alleviated these symptoms. She is no longer taking Benadryl. She also had mouth blisters which had caused her a lot of pain.   [name] is also experiencing chemo taste. This, along with the indigestion, has led to poor appetite. Patient says she skips meals regularly.   She has an increased heart rate and states she runs a slight fever at night (99.5 degrees farenheit). Her nose is dry and sometimes experiences epistaxis.  Patient experiences intermittent vaginal pain while walking. She describes it as a burning sensation, but denies any external redness, no vaginal discharge.  She experiences difficulty sleeping. She admits that she is overall having difficulty accepting having chemotherapy.  Patient asked if she could go to the dentist while undergoing chemotherapy, she would like to have her teeth cleaned.   MEDICAL HISTORY:  Past Medical History:  Diagnosis Date  . Anxiety   . Breast cancer ([place])   . Breast cancer of upper-outer quadrant of left female breast ([place]) 01/07/2016  . Complication of anesthesia   . DDD (degenerative disc disease), cervical   . DDD (degenerative disc disease), lumbar   . Depression   . Dysrhythmia    last cardiology ov 06-13-2013 in [facility], takes metoprolol for HR  . Family history of breast cancer   . Family history of colon cancer   . Fibromyalgia   . GERD  (gastroesophageal reflux disease)   . Hematuria   . History of echocardiogram 2015   mild mital insufficiency, otherwise normal  . Leaky heart valve   . Multiple thyroid nodules   . PAC (premature atrial contraction)   . Palpitations   . PONV (postoperative nausea and vomiting)   . SOB (shortness of breath)   . Tachycardia     SURGICAL HISTORY: Past Surgical History:  Procedure Laterality Date  . BREAST LUMPECTOMY    . LAPAROSCOPIC APPENDECTOMY N/A 11/10/2012   Procedure: APPENDECTOMY LAPAROSCOPIC;  Surgeon: Jamesetta So, MD;  Location: [facility];  Service: General;  Laterality: N/A;  . MASTECTOMY    . MASTECTOMY W/ SENTINEL NODE BIOPSY Left 01/07/2016  . MASTECTOMY W/ SENTINEL NODE BIOPSY Left 01/07/2016   Procedure: LEFT TOTAL MASTECTOMY WITH LEFT AXILLARY SENTINEL LYMPH NODE BIOPSY;  Surgeon: Donnie Mesa, MD;  Location: [facility];  Service: General;  Laterality: Left;  . PORTACATH PLACEMENT Right 01/07/2016   IJ  . PORTACATH PLACEMENT Right 01/07/2016   Procedure: INSERTION PORT-A-CATH RIGHT INTERNAL JUGULAR WITH ULTRASOUND;  Surgeon: Donnie Mesa, MD;  Location: [facility];  Service: General;  Laterality: Right;    SOCIAL HISTORY: Social History   Social History  . Marital status: Married    Spouse name: N/A  . Number of children: 2  . Years of education: N/A   Occupational History  . Not on file.   Social History Main Topics  . Smoking status: Never Smoker  . Smokeless tobacco: Never Used  .  Alcohol use No  . Drug use: No  . Sexual activity: Yes     Comment: married   Other Topics Concern  . Not on file   Social History Narrative  . No narrative on file   She is married with two children and a fourth grandchild is on the way. She was born in [place] and has 7 siblings.   Patient enjoys staying home and taking care of husband's business. She has 2 outside pets. She has never smoked. Although husband doesn't express emotion much, he is very supportive.  FAMILY  HISTORY: Family History  Problem Relation Age of Onset  . Cancer Mother     colon  . Diabetes Father   . Diabetes Sister   . Alcohol abuse Brother   . Cancer Brother 2    lung cancer  . Cancer Brother     penile  . Breast cancer Maternal Aunt   . Cancer Other     unknown form of cancer   Her mom passed away at 76 from colon cancer. Her father was a diabetic and is also deceased. Two brothers are also deceased (1 was a smoker and died of lung cancer and the other was an alcoholic).   ALLERGIES:  is allergic to latex; naprosyn [naproxen]; other; and codeine.  MEDICATIONS:  Current Outpatient Prescriptions  Medication Sig Dispense Refill  . acetaminophen (TYLENOL) 500 MG tablet Take 500 mg by mouth every 6 (six) hours as needed for mild pain.    Marland Kitchen ALPRAZolam (XANAX) 0.5 MG tablet Take 0.5 tablets by mouth daily as needed for anxiety.     Marland Kitchen antiseptic oral rinse (BIOTENE) LIQD 15 mLs by Mouth Rinse route as needed for dry mouth.    . CARBOPLATIN IV Inject into the vein. Every 3 weeks    . cephALEXin (KEFLEX) 250 MG capsule take 1 capsule by mouth four times a day  0  . Cholecalciferol (VITAMIN D3) 1000 units CAPS Take 1,000 Units by mouth every morning.    Marland Kitchen dexamethasone (DECADRON) 4 MG tablet Take 2 tablets (8 mg total) by mouth 2 (two) times daily. Start the day before Taxotere. Then again the day after chemo for 3 days. 30 tablet 1  . DOCEtaxel (TAXOTERE IV) Inject into the vein. Every 3 weeks    . lidocaine-prilocaine (EMLA) cream Apply to affected area once 30 g 3  . metoprolol tartrate (LOPRESSOR) 25 MG tablet Take 12.5-25 mg by mouth 2 (two) times daily. 25 mg in the morning. 12.5 mg in the evening,    . omeprazole (PRILOSEC) 40 MG capsule Take 1 capsule (40 mg total) by mouth 2 (two) times daily before a meal. 60 capsule 2  . ondansetron (ZOFRAN) 8 MG tablet Take 1 tablet (8 mg total) by mouth 2 (two) times daily as needed for refractory nausea / vomiting. Start on day 3 after  chemo. 30 tablet 1  . oxyCODONE (OXY IR/ROXICODONE) 5 MG immediate release tablet Take 1-2 tablets (5-10 mg total) by mouth every 4 (four) hours as needed for moderate pain. 30 tablet 0  . Pegfilgrastim (NEULASTA ONPRO [place]) Inject into the skin. Every 3 weeks    . Polyethyl Glycol-Propyl Glycol (SYSTANE) 0.4-0.3 % SOLN Apply 1 drop to eye at bedtime.    . prochlorperazine (COMPAZINE) 10 MG tablet Take 1 tablet (10 mg total) by mouth every 6 (six) hours as needed (Nausea or vomiting). 30 tablet 1  . Trastuzumab (HERCEPTIN IV) Inject into the vein. Every 3  weeks    . famotidine (PEPCID) 20 MG tablet Take 1 tablet (20 mg total) by mouth 2 (two) times daily. 60 tablet 2   No current facility-administered medications for this visit.     Review of Systems  Constitutional: Positive for fever.       Slight fever at night  HENT: Positive for nosebleeds.        Nose bleed due to dryness  Eyes: Negative.   Respiratory: Negative.   Cardiovascular: Negative.   Gastrointestinal: Positive for abdominal pain, constipation, diarrhea and heartburn.       Abdominal pain; has never had colonoscopy  Genitourinary: Negative.        Vaginal pain  Musculoskeletal: Negative.        Left foot pain from injury a year ago  Skin:       Face red/burns  Neurological: Negative.   Endo/Heme/Allergies: Negative.   Psychiatric/Behavioral: The patient has insomnia.   All other systems reviewed and are negative. 14 point ROS was done and is otherwise as detailed above or in HPI   PHYSICAL EXAMINATION: ECOG PERFORMANCE STATUS: 0 - Asymptomatic  Vitals:   02/11/16 1342  BP: 131/73  Pulse: 94  Resp: 16  Temp: 98.7 F (37.1 C)   Filed Weights   02/11/16 1342  Weight: 163 lb 9.6 oz (74.2 kg)    Physical Exam  Constitutional: She is oriented to person, place, and time and well-developed, well-nourished, and in no distress.  HENT:  Head: Normocephalic and atraumatic.  Nose: Nose normal.  Mouth/Throat:  Oropharynx is clear and moist. No oropharyngeal exudate.  Eyes: Conjunctivae and EOM are normal. Pupils are equal, round, and reactive to light. Right eye exhibits no discharge. Left eye exhibits no discharge. No scleral icterus.  Neck: Normal range of motion. Neck supple. No tracheal deviation present. No thyromegaly present.  Cardiovascular: Normal rate, regular rhythm and normal heart sounds.  Exam reveals no gallop and no friction rub.   No murmur heard. Pulmonary/Chest: Effort normal and breath sounds normal. She has no wheezes. She has no rales.  Abdominal: Soft. Bowel sounds are normal. She exhibits no distension and no mass. There is no tenderness. There is no rebound and no guarding.  Musculoskeletal: Normal range of motion. She exhibits no edema.  Lymphadenopathy:    She has no cervical adenopathy.  Neurological: She is alert and oriented to person, place, and time. She has normal reflexes. No cranial nerve deficit. Gait normal. Coordination normal.  Skin: Skin is warm and dry.  Mild erythema on chin, no appreciable rash  Psychiatric: Mood, memory, affect and judgment normal.  Nursing note and vitals reviewed.    LABORATORY DATA:  I have reviewed the data as listed Lab Results  Component Value Date   WBC 13.7 (H) 01/31/2016   HGB 14.1 01/31/2016   HCT 42.1 01/31/2016   MCV 94.2 01/31/2016   PLT 299 01/31/2016   CMP     Component Value Date/Time   NA 135 01/31/2016 0909   K 3.6 01/31/2016 0909   CL 105 01/31/2016 0909   CO2 22 01/31/2016 0909   GLUCOSE 131 (H) 01/31/2016 0909   BUN 16 01/31/2016 0909   CREATININE 0.76 01/31/2016 0909   CALCIUM 9.5 01/31/2016 0909   PROT 7.6 01/31/2016 0909   ALBUMIN 4.0 01/31/2016 0909   AST 27 01/31/2016 0909   ALT 29 01/31/2016 0909   ALKPHOS 77 01/31/2016 0909   BILITOT 0.5 01/31/2016 0909   GFRNONAA >60 01/31/2016  0909   GFRAA >60 01/31/2016 0909     RADIOGRAPHIC STUDIES: I have personally reviewed the radiological  images as listed and agreed with the findings in the report.  Addendum   ADDENDUM REPORT: 12/25/2015 10:27  ADDENDUM: Pathology revealed grade III invasive ductal carcinoma in the left breast. This was found to be concordant by Dr. Dorise Bullion. Pathology results were discussed with the patient by telephone. The patient reported doing well after the biopsy with tenderness at the site. Post biopsy instructions and care were reviewed and questions were answered. The patient was encouraged to call [facility] for any additional concerns. The patient requested surgical consultation in [place] and an appointment has been made with Dr. Donnie Mesa at [facility] on December 27, 2015.  Pathology results reported by Susa Raring RN, BSN on 12/25/2015.   Electronically Signed   By: Dorise Bullion III M.D   On: 12/25/2015 10:27   Addended by York Ram, MD on 12/25/2015 10:32 AM    Study Result   CLINICAL DATA:  Left breast mass  EXAM: ULTRASOUND GUIDED LEFT BREAST CORE NEEDLE BIOPSY  COMPARISON:  Previous exam(s).  FINDINGS: I met with the patient and we discussed the procedure of ultrasound-guided biopsy, including benefits and alternatives. We discussed the high likelihood of a successful procedure. We discussed the risks of the procedure, including infection, bleeding, tissue injury, clip migration, and inadequate sampling. Informed written consent was given. The usual time-out protocol was performed immediately prior to the procedure.  Using sterile technique and 1% Lidocaine as local anesthetic, under direct ultrasound visualization, a 12 gauge spring-loaded device was used to perform biopsy of a left breast mass using a lateral approach. At the conclusion of the procedure a tissue marker clip was deployed into the biopsy cavity. Follow up 2 view mammogram was performed and dictated  separately.  IMPRESSION: Ultrasound guided biopsy of a left breast mass. No apparent complications.  Electronically Signed: By: Dorise Bullion III M.D On: 12/24/2015 15:30     PATHOLOGY:     ASSESSMENT & PLAN:  L breast cancer upper outer quadrant ER- PR- HER 2 + Prior history L breast cancer ER+, lumpectomy, XRT, Tamoxifen [facility]  Anxiety about health   Patient appears to be having a difficult time with chemotherapy. I have increased her Prilosec to 40 mg twice daily and recommended she take over the counter Pepsid in conjunction with Prilosec. I also suggested she try half a cup of Miralax to alleviate constipation without causing diarrhea or she could alternatively try Colace.  I also suggested she try plastic utensils to help with the metallic taste. I encouraged her to review her information from her chemotherapy class.   She has a lot of stress related to her heart. She worries about chemotherapy and cardiac side effects. I again reassured her that we are going to monitor her regularly with echocardiograms and physical exams.   I advised her I would prefer that she waits until she is done with chemotherapy to go to the dentist. If she feels she cannot wait I have asked her to notify me and we can try to work around her chemotherapy schedule.    I told Hanni I can write her a prescription for a chlorhexidine mouthwash to alleviate mouth soreness or she can continue to use saltwater if she finds that is working for her.   The patient reported a temperature of 99.5. I reviewed the definition  of fever while on chemotherapy. I reviewed chemotherapy and neutropenia.  She will follow up with Robynn Pane, PA- C on 02/21/2016.  All questions were answered. The patient knows to call the clinic with any problems, questions or concerns.  This document serves as a record of services personally performed by Ancil Linsey, MD. It was created on her behalf by Elmyra Ricks, a trained  medical scribe. The creation of this record is based on the scribe's personal observations and the provider's statements to them. This document has been checked and approved by the attending provider.  I have reviewed the above documentation for accuracy and completeness and I agree with the above.  This note was electronically signed.    Molli Hazard, MD  02/11/2016 5:28 PM

## 2016-02-11 NOTE — Patient Instructions (Addendum)
[place] at [facility] Discharge Instructions  RECOMMENDATIONS MADE BY THE CONSULTANT AND ANY TEST RESULTS WILL BE SENT TO YOUR REFERRING PHYSICIAN.  You saw Dr. Whitney Muse today. Return to clinic 02/21/16 as scheduled. Take Prilosec 40 mg by mouth twice a day. Take Pepcid 20 mg by mouth twice daily  Thank you for choosing [place] at [facility] to provide your oncology and hematology care.  To afford each patient quality time with our provider, please arrive at least 15 minutes before your scheduled appointment time.   Beginning January 23rd 2017 lab work for the [facility] will be done in the  Main lab at [facility] on 1st floor. If you have a lab appointment with the [facility] please come in thru the  Main Entrance and check in at the main information desk  You need to re-schedule your appointment should you arrive 10 or more minutes late.  We strive to give you quality time with our providers, and arriving late affects you and other patients whose appointments are after yours.  Also, if you no show three or more times for appointments you may be dismissed from the clinic at the providers discretion.     Again, thank you for choosing [facility].  Our hope is that these requests will decrease the amount of time that you wait before being seen by our physicians.       _____________________________________________________________  Should you have questions after your visit to [facility], please contact our office at [phone] between the hours of 8:30 a.m. and 4:30 p.m.  Voicemails left after 4:30 p.m. will not be returned until the following business day.  For prescription refill requests, have your pharmacy contact our office.         Resources For Cancer Patients and their Caregivers ? American Cancer Society: Can assist with transportation, wigs, general needs, runs Look Good Feel Better.         320-121-4171 ? Cancer Care: Provides financial assistance, online support groups, medication/co-pay assistance.  1-800-813-HOPE (906)666-8325) ? [place] Assists [place] cancer patients and their families through emotional , educational and financial support.  517-628-1312 ? [place] DSS Where to apply for food stamps, Medicaid and utility assistance. (484) 457-8294 ? [facility]: Transportation to medical appointments. (706)571-2529 ? Social Security Administration: May apply for disability if have a Stage IV cancer. 530-347-9589 (684)670-2741 ? [employer], Disability and Transit Services: Assists with nutrition, care and transit needs. [place] Support Programs: @10RELATIVEDAYS @ > Cancer Support Group  2nd Tuesday of the month 1pm-2pm, Journey Room  > Creative Journey  3rd Tuesday of the month 1130am-1pm, Journey Room  > Look Good Feel Better  1st Wednesday of the month 10am-12 noon, Journey Room (Call [place] to register [phone])

## 2016-02-14 ENCOUNTER — Other Ambulatory Visit (HOSPITAL_COMMUNITY): Payer: Self-pay | Admitting: Emergency Medicine

## 2016-02-14 MED ORDER — DOXYCYCLINE HYCLATE 100 MG PO TABS: 100.0000 mg | ORAL_TABLET | Freq: Two times a day (BID) | ORAL | 0 refills | Status: DC

## 2016-02-14 NOTE — Progress Notes (Signed)
Pt called and stated that the rash on her face was worse.  Spoke with Kirby Crigler PA and I called in doxycycline 100 mg BID for 5 days.  She states her face is still red I told her she could also talk benadryl or zytrec to help with that.

## 2016-02-21 ENCOUNTER — Encounter (HOSPITAL_COMMUNITY): Payer: Self-pay | Admitting: Oncology

## 2016-02-21 ENCOUNTER — Encounter (HOSPITAL_BASED_OUTPATIENT_CLINIC_OR_DEPARTMENT_OTHER): Payer: BLUE CROSS/BLUE SHIELD

## 2016-02-21 ENCOUNTER — Encounter: Payer: Self-pay | Admitting: Dietician

## 2016-02-21 ENCOUNTER — Encounter (HOSPITAL_BASED_OUTPATIENT_CLINIC_OR_DEPARTMENT_OTHER): Payer: BLUE CROSS/BLUE SHIELD | Admitting: Oncology

## 2016-02-21 VITALS — BP 119/81 | HR 84 | Temp 97.9°F | Resp 18

## 2016-02-21 DIAGNOSIS — C50412 Malignant neoplasm of upper-outer quadrant of left female breast: Secondary | ICD-10-CM

## 2016-02-21 DIAGNOSIS — Z5112 Encounter for antineoplastic immunotherapy: Secondary | ICD-10-CM

## 2016-02-21 DIAGNOSIS — Z171 Estrogen receptor negative status [ER-]: Secondary | ICD-10-CM

## 2016-02-21 DIAGNOSIS — Z5111 Encounter for antineoplastic chemotherapy: Secondary | ICD-10-CM | POA: Diagnosis not present

## 2016-02-21 DIAGNOSIS — Z5189 Encounter for other specified aftercare: Secondary | ICD-10-CM | POA: Diagnosis not present

## 2016-02-21 LAB — CBC WITH DIFFERENTIAL/PLATELET
Basophils Absolute: 0 10*3/uL (ref 0.0–0.1)
Basophils Relative: 0 %
EOS ABS: 0 10*3/uL (ref 0.0–0.7)
Eosinophils Relative: 0 %
HCT: 39.4 % (ref 36.0–46.0)
HEMOGLOBIN: 13.1 g/dL (ref 12.0–15.0)
LYMPHS ABS: 2.1 10*3/uL (ref 0.7–4.0)
LYMPHS PCT: 18 %
MCH: 32 pg (ref 26.0–34.0)
MCHC: 33.2 g/dL (ref 30.0–36.0)
MCV: 96.1 fL (ref 78.0–100.0)
MONOS PCT: 6 %
Monocytes Absolute: 0.8 10*3/uL (ref 0.1–1.0)
NEUTROS PCT: 76 %
Neutro Abs: 9.3 10*3/uL — ABNORMAL HIGH (ref 1.7–7.7)
Platelets: 281 10*3/uL (ref 150–400)
RBC: 4.1 MIL/uL (ref 3.87–5.11)
RDW: 13.3 % (ref 11.5–15.5)
WBC: 12.2 10*3/uL — AB (ref 4.0–10.5)

## 2016-02-21 LAB — COMPREHENSIVE METABOLIC PANEL
ALT: 27 U/L (ref 14–54)
AST: 21 U/L (ref 15–41)
Albumin: 4 g/dL (ref 3.5–5.0)
Alkaline Phosphatase: 72 U/L (ref 38–126)
Anion gap: 7 (ref 5–15)
BILIRUBIN TOTAL: 0.6 mg/dL (ref 0.3–1.2)
BUN: 15 mg/dL (ref 6–20)
CO2: 23 mmol/L (ref 22–32)
CREATININE: 0.68 mg/dL (ref 0.44–1.00)
Calcium: 9.3 mg/dL (ref 8.9–10.3)
Chloride: 105 mmol/L (ref 101–111)
GFR calc Af Amer: >60 mL/min (ref >60)
Glucose, Bld: 119 mg/dL — ABNORMAL HIGH (ref 65–99)
POTASSIUM: 3.7 mmol/L (ref 3.5–5.1)
Sodium: 135 mmol/L (ref 135–145)
TOTAL PROTEIN: 7.2 g/dL (ref 6.5–8.1)

## 2016-02-21 MED ORDER — HEPARIN SOD (PORK) LOCK FLUSH 100 UNIT/ML IV SOLN
500.0000 [IU] | Freq: Once | INTRAVENOUS | Status: AC | PRN
  Administered 2016-02-21: 500 [IU]
  Filled 2016-02-21: qty 5

## 2016-02-21 MED ORDER — TRASTUZUMAB CHEMO 150 MG IV SOLR
6.0000 mg/kg | Freq: Once | INTRAVENOUS | Status: AC
  Administered 2016-02-21: 441 mg via INTRAVENOUS
  Filled 2016-02-21: qty 21

## 2016-02-21 MED ORDER — PALONOSETRON HCL INJECTION 0.25 MG/5ML
0.2500 mg | Freq: Once | INTRAVENOUS | Status: AC
  Administered 2016-02-21: 0.25 mg via INTRAVENOUS
  Filled 2016-02-21: qty 5

## 2016-02-21 MED ORDER — SODIUM CHLORIDE 0.9 % IV SOLN
Freq: Once | INTRAVENOUS | Status: AC
  Administered 2016-02-21: 10:00:00 via INTRAVENOUS

## 2016-02-21 MED ORDER — SODIUM CHLORIDE 0.9% FLUSH: 10.0000 mL | INTRAVENOUS | Status: DC | PRN

## 2016-02-21 MED ORDER — DEXAMETHASONE SODIUM PHOSPHATE 10 MG/ML IJ SOLN
10.0000 mg | Freq: Once | INTRAMUSCULAR | Status: AC
Start: 2016-02-21 — End: 2016-02-21
  Administered 2016-02-21: 10 mg via INTRAVENOUS
  Filled 2016-02-21: qty 1

## 2016-02-21 MED ORDER — DOCETAXEL CHEMO INJECTION 160 MG/16ML
75.0000 mg/m2 | Freq: Once | INTRAVENOUS | Status: AC
  Administered 2016-02-21: 140 mg via INTRAVENOUS
  Filled 2016-02-21: qty 14

## 2016-02-21 MED ORDER — PEGFILGRASTIM 6 MG/0.6ML ~~LOC~~ PSKT
6.0000 mg | PREFILLED_SYRINGE | Freq: Once | SUBCUTANEOUS | Status: AC
  Administered 2016-02-21: 6 mg via SUBCUTANEOUS
  Filled 2016-02-21: qty 0.6

## 2016-02-21 MED ORDER — DIPHENHYDRAMINE HCL 25 MG PO CAPS
50.0000 mg | ORAL_CAPSULE | Freq: Once | ORAL | Status: AC
  Administered 2016-02-21: 50 mg via ORAL
  Filled 2016-02-21: qty 2

## 2016-02-21 MED ORDER — SODIUM CHLORIDE 0.9 % IV SOLN
558.5000 mg | Freq: Once | INTRAVENOUS | Status: AC
  Administered 2016-02-21: 560 mg via INTRAVENOUS
  Filled 2016-02-21: qty 56

## 2016-02-21 MED ORDER — SODIUM CHLORIDE 0.9 % IV SOLN: 10.0000 mg | Freq: Once | INTRAVENOUS | Status: DC

## 2016-02-21 MED ORDER — ACETAMINOPHEN 325 MG PO TABS
650.0000 mg | ORAL_TABLET | Freq: Once | ORAL | Status: AC
  Administered 2016-02-21: 650 mg via ORAL
  Filled 2016-02-21: qty 2

## 2016-02-21 NOTE — Progress Notes (Signed)
RD consulted by PA for weight gain and GERD management.   Contacted Pt by visiting during infusion  Wt Readings from Last 10 Encounters:  02/21/16 161 lb 12.8 oz (73.4 kg)  02/11/16 163 lb 9.6 oz (74.2 kg)  01/31/16 163 lb 11.2 oz (74.3 kg)  01/22/16 164 lb (74.4 kg)  01/07/16 170 lb 4.8 oz (77.2 kg)  01/03/16 166 lb 4 oz (75.4 kg)  01/02/16 167 lb 11.2 oz (76.1 kg)  02/26/15 176 lb (79.8 kg)  01/15/15 176 lb (79.8 kg)  01/12/15 170 lb (77.1 kg)   Patient weight has Decreased by about 10 lbs in the last month and a half.  Patient reports oral intake as fair and is suffering from symptoms including severe Acid Reflux and taste changes.   RD went through dietary recall: Breakfast: Eggs, oatmeal with sugar, toast with butter Lunch: TV dinner or leftovers Dinner: TV dinner or leftovers Beverages: Regular Sprite and Water  Unfortunately, a diet meant to manage heartburn will be lower in kcals since limiting fat intake is a mainstay of reflux management.   Patient says she has had heartburn since she was a teenager. From her report she is already eating and behaving appropriately to manage her GERD. She says she avoids fatty meat option, butter, oils, greasier foods. She avoids acidic foods such as chocolate, coffee, and citrus. She doesn't use spices or eat spicier foods. She sleeps upright, doesn't smoke or drink alcohol. The only item patient was found to be eating that may worsen her heart burn was her carbonated beverages. When this was brought up she immediately replied "Well, I dont drink that much of that".   She noted early in conversation that she was told she had a hiatal hernia years back. She asked if this could worsen heartburn. RD confirmed that it can. She should she would ask her doctor about this.   She had questions about which high protein foods she could eat that wouldn't worsen her reflux. Recommended eggs, lean cuts of meat, fish, cheese in moderate amounts and lower  fat dairy.   RD did recommend a dietary supplement. Thought the regular ensure/boost are superior for wt maintenance, unsure if this may worsen her heartburn. She is given samples and advised to consume these slowly throughout the day. If she is able to tolerate these, RD discussed the Ensure Assistance program. She is advised to call [facility] or RD if she can tolerate these.   She mentioned multiple times about how she wants to avoid sugar, eat "healthy", lose weight etc. RD emphasized that cancer treatment is not a weight loss regimen and weight loss during treatment is associated with poorer outcomes. She should not be concerned with her sugar or cholesterol levels at this time. Mentioned that there is a "finding your new normal" education class that can be taken after she completes treatment if she is interested in a healthier lifestyle.   Education session somewhat limited by patients anxiety and tendency to cut RD off by asking a new question when he was trying to answer her previous question.   Left my contact info, coupons, samples, and handouts titled GERD nutrition therapy and "Increasing Calories and Protein".  Burtis Junes RD, LDN, [facility] Pager: [phone] 02/21/2016 10:59 AM

## 2016-02-21 NOTE — Progress Notes (Signed)
1340:  Tolerated tx w/o adverse reaction.  Alert, in no distress.  VSS.  ONPRO neulasta on body injector in place RUE. See MAR for administration details. Injector in place and engaged with green light indicator on flashing. Tolerated application with out problems.  Discharged ambulatory in c/o spouse.

## 2016-02-21 NOTE — Assessment & Plan Note (Addendum)
Stage IA (T1CN0M0) left invasive ductal carcinoma of upper outer quadrant, ER/PR negative, HER2 POSITIVE.  S/P simple left mastectomy by Dr. Georgette Dover on 01/07/2016.  Currently on [facility] systemic chemotherapy.  Oncology history updated.  Pre-treatment labs today: CBC diff, CMET.  I personally reviewed and went over laboratory results with the patient.  The results are noted within this dictation.  She has been seen by [employer], Roma Kayser on 01/23/2016.  Patient refused genetic testing despite recommendations.    Her mouth sores have resolved.  GERD symptoms are improved.  She is advised to avoid acidic and spicy foods.  Weight loss of 2 lbs is noted.  I will ask Burtis Junes, RD to make contact with the patient to help assist her with food choices to maintain body weight (while avoiding spicy and acidic food).  Return in 3 weeks for follow-up and cycle #3 of treatment.

## 2016-02-21 NOTE — Progress Notes (Signed)
[place], [place] [address]  Malignant neoplasm of upper-outer quadrant of left breast in female, estrogen receptor negative [facility])  CURRENT THERAPY: [place] beginning on 01/31/2016  INTERVAL HISTORY: NASHALIE SALLIS 60 y.o. female returns for followup of Stage IA (T1CN0M0) left invasive ductal carcinoma of upper outer quadrant, ER/PR negative, HER2 POSITIVE.  S/P simple left mastectomy by Dr. Georgette Dover on 01/07/2016.  Currently on [facility] systemic chemotherapy.    Breast cancer of upper-outer quadrant of left female breast ([place])   11/07/1999 Surgery    Lumpectomy/sentinel LN biopsy invasive ductal carcinoma and DCIS at [facility], 0/6 sentinel LN      11/10/1999 - 01/08/2005 Anti-estrogen oral therapy    Tamoxifen 20 mg daily      11/24/1999 - 01/02/2000 Radiation Therapy         12/18/2015 Mammogram    2D digital screening bilateral mammogram with CAD and adjunct TOMO, in the L breast a possible mass warrants further evaluation. In R breast no findings suspicious for malignancy      12/24/2015 Pathology Results    Invasive ductal carcinoma Grade 3 Left core needle biopsy 1:30 o clock ER- PR- Ki67 70%, HER 2 positive      12/24/2015 Imaging    L breast Ultrasound, notes palpable area of concern demonstrates a hypoechoic irregular mass at 1:30, 7 cm from the nipple measuring 1.3 x 0.9 x 1.4 cm. No evidence of L axillary LAD      01/07/2016 Procedure    Left simple mastectomy by Dr. Georgette Dover      01/08/2016 Pathology Results    Breast, simple mastectomy, Left - INVASIVE DUCTAL CARCINOMA, GRADE III/III, SPANNING 1.4 CM. - THE SURGICAL RESECTION MARGINS ARE NEGATIVE FOR CARCINOMA.      01/23/2016 Genetic Testing    Patient refused genetic testing at time of consultation with Roma Kayser.  "Despite our recommendation, Ms. Crandell did not wish to pursue genetic testing at today's visit."      01/28/2016 Echocardiogram    Left ventricle: The  cavity size was normal. Wall thickness was normal. Systolic function was normal. The estimated ejection fraction was in the range of 55% to 60%. Wall motion was normal; there were no regional wall motion abnormalities. Doppler parameters are consistent with abnormal left ventricular relaxation (grade 1 diastolic dysfunction).      01/31/2016 -  Chemotherapy    The patient had palonosetron (ALOXI) injection 0.25 mg, 0.25 mg, Intravenous,  Once, 1 of 6 cycles  pegfilgrastim (NEULASTA ONPRO KIT) injection 6 mg, 6 mg, Subcutaneous, Once, 1 of 6 cycles  CARBOplatin (PARAPLATIN) 560 mg in sodium chloride 0.9 % 250 mL chemo infusion, 560 mg (100 % of original dose 558.5 mg), Intravenous,  Once, 1 of 6 cycles Dose modification:   (original dose 558.5 mg, Cycle 1),   (original dose 558.5 mg, Cycle 2)  DOCEtaxel (TAXOTERE) 140 mg in dextrose 5 % 250 mL chemo infusion, 75 mg/m2 = 140 mg, Intravenous,  Once, 1 of 6 cycles  for chemotherapy treatment.        01/31/2016 -  Antibody Plan    Herceptin x 52 weeks      She notes improvement in her acid reflux symptoms with increase in Prilosec and addition of Pepcid. She notes that it still occurs from time to time when she eats "spicy foods" and acidic foods. She is advised to avoid these food products. She notes this is been  a long-standing issue going back to her teenage years.  I noted a 2 pound weight loss. I last Burtis Junes, RD, to see the patient today regarding that.  She notes that her mouth sores have resolved.  Review of Systems  Constitutional: Positive for weight loss. Negative for chills, fever and malaise/fatigue.  HENT: Negative.   Eyes: Negative.   Respiratory: Negative.  Negative for cough, sputum production and shortness of breath.   Cardiovascular: Negative.  Negative for chest pain.  Gastrointestinal: Positive for heartburn. Negative for constipation, diarrhea, nausea and vomiting.  Genitourinary: Negative.     Musculoskeletal: Negative.   Skin: Negative.   Neurological: Negative.  Negative for weakness.  Endo/Heme/Allergies: Negative.   Psychiatric/Behavioral: Negative.     Past Medical History:  Diagnosis Date  . Anxiety   . Breast cancer ([place])   . Breast cancer of upper-outer quadrant of left female breast ([place]) 01/07/2016  . Complication of anesthesia   . DDD (degenerative disc disease), cervical   . DDD (degenerative disc disease), lumbar   . Depression   . Dysrhythmia    last cardiology ov 06-13-2013 in [facility], takes metoprolol for HR  . Family history of breast cancer   . Family history of colon cancer   . Fibromyalgia   . GERD (gastroesophageal reflux disease)   . Hematuria   . History of echocardiogram 2015   mild mital insufficiency, otherwise normal  . Leaky heart valve   . Multiple thyroid nodules   . PAC (premature atrial contraction)   . Palpitations   . PONV (postoperative nausea and vomiting)   . SOB (shortness of breath)   . Tachycardia     Past Surgical History:  Procedure Laterality Date  . BREAST LUMPECTOMY    . LAPAROSCOPIC APPENDECTOMY N/A 11/10/2012   Procedure: APPENDECTOMY LAPAROSCOPIC;  Surgeon: Jamesetta So, MD;  Location: [facility];  Service: General;  Laterality: N/A;  . MASTECTOMY    . MASTECTOMY W/ SENTINEL NODE BIOPSY Left 01/07/2016  . MASTECTOMY W/ SENTINEL NODE BIOPSY Left 01/07/2016   Procedure: LEFT TOTAL MASTECTOMY WITH LEFT AXILLARY SENTINEL LYMPH NODE BIOPSY;  Surgeon: Donnie Mesa, MD;  Location: [place];  Service: General;  Laterality: Left;  . PORTACATH PLACEMENT Right 01/07/2016   IJ  . PORTACATH PLACEMENT Right 01/07/2016   Procedure: INSERTION PORT-A-CATH RIGHT INTERNAL JUGULAR WITH ULTRASOUND;  Surgeon: Donnie Mesa, MD;  Location: [place];  Service: General;  Laterality: Right;    Family History  Problem Relation Age of Onset  . Cancer Mother     colon  . Diabetes Father   . Diabetes Sister   . Alcohol abuse Brother   . Cancer  Brother 5    lung cancer  . Cancer Brother     penile  . Breast cancer Maternal Aunt   . Cancer Other     unknown form of cancer    Social History   Social History  . Marital status: Married    Spouse name: N/A  . Number of children: 2  . Years of education: N/A   Social History Main Topics  . Smoking status: Never Smoker  . Smokeless tobacco: Never Used  . Alcohol use No  . Drug use: No  . Sexual activity: Yes     Comment: married   Other Topics Concern  . None   Social History Narrative  . None     PHYSICAL EXAMINATION  ECOG PERFORMANCE STATUS: 1 - Symptomatic but completely ambulatory  Vitals:  02/21/16 0859  BP: 114/86  Pulse: 77  Resp: 16  Temp: 98.3 F (36.8 C)    GENERAL:alert, no distress, well nourished, well developed, comfortable, cooperative, smiling and Accompanied by husband. SKIN: skin color, texture, turgor are normal, no rashes or significant lesions HEAD: Normocephalic, No masses, lesions, tenderness or abnormalities EYES: normal, EOMI, Conjunctiva are pink and non-injected EARS: External ears normal OROPHARYNX:no exudate, no erythema, lips, buccal mucosa, and tongue normal and mucous membranes are moist  NECK: supple, trachea midline LYMPH:  not examined BREAST:not examined LUNGS: clear to auscultation  HEART: regular rate & rhythm ABDOMEN:abdomen soft and normal bowel sounds BACK: Back symmetric, no curvature. EXTREMITIES:less then 2 second capillary refill, no joint deformities, effusion, or inflammation, no skin discoloration, no cyanosis  NEURO: alert & oriented x 3 with fluent speech, no focal motor/sensory deficits, gait normal   LABORATORY DATA: CBC    Component Value Date/Time   WBC 13.7 (H) 01/31/2016 0909   RBC 4.47 01/31/2016 0909   HGB 14.1 01/31/2016 0909   HCT 42.1 01/31/2016 0909   PLT 299 01/31/2016 0909   MCV 94.2 01/31/2016 0909   MCH 31.5 01/31/2016 0909   MCHC 33.5 01/31/2016 0909   RDW 12.5 01/31/2016  0909   LYMPHSABS 2.0 01/31/2016 0909   MONOABS 0.6 01/31/2016 0909   EOSABS 0.0 01/31/2016 0909   BASOSABS 0.0 01/31/2016 0909      Chemistry      Component Value Date/Time   NA 135 01/31/2016 0909   K 3.6 01/31/2016 0909   CL 105 01/31/2016 0909   CO2 22 01/31/2016 0909   BUN 16 01/31/2016 0909   CREATININE 0.76 01/31/2016 0909      Component Value Date/Time   CALCIUM 9.5 01/31/2016 0909   ALKPHOS 77 01/31/2016 0909   AST 27 01/31/2016 0909   ALT 29 01/31/2016 0909   BILITOT 0.5 01/31/2016 0909        PENDING LABS:   RADIOGRAPHIC STUDIES:  No results found.   PATHOLOGY:    ASSESSMENT AND PLAN:  Breast cancer of upper-outer quadrant of left female breast ([place]) Stage IA (T1CN0M0) left invasive ductal carcinoma of upper outer quadrant, ER/PR negative, HER2 POSITIVE.  S/P simple left mastectomy by Dr. Georgette Dover on 01/07/2016.  Currently on [facility] systemic chemotherapy.  Oncology history updated.  Pre-treatment labs today: CBC diff, CMET.  I personally reviewed and went over laboratory results with the patient.  The results are noted within this dictation.  She has been seen by [employer], Roma Kayser on 01/23/2016.  Patient refused genetic testing despite recommendations.    Her mouth sores have resolved.  GERD symptoms are improved.  She is advised to avoid acidic and spicy foods.  Weight loss of 2 lbs is noted.  I will ask Burtis Junes, RD to make contact with the patient to help assist her with food choices to maintain body weight (while avoiding spicy and acidic food).  Return in 3 weeks for follow-up and cycle #3 of treatment.   ORDERS PLACED FOR THIS ENCOUNTER: No orders of the defined types were placed in this encounter.   MEDICATIONS PRESCRIBED THIS ENCOUNTER: No orders of the defined types were placed in this encounter.   THERAPY PLAN:  Continue with treatment as planned.  All questions were answered. The patient knows to call the clinic with  any problems, questions or concerns. We can certainly see the patient much sooner if necessary.  Patient and plan discussed with Dr. Ancil Linsey and  she is in agreement with the aforementioned.   This note is electronically signed by: Robynn Pane, PA-C 02/21/2016 9:07 AM

## 2016-02-21 NOTE — Patient Instructions (Signed)
[place] at [facility] Discharge Instructions  RECOMMENDATIONS MADE BY THE CONSULTANT AND ANY TEST RESULTS WILL BE SENT TO YOUR REFERRING PHYSICIAN.  You saw Kirby Crigler, PA-C, today. Treatment and labs today. Return for follow up and treatment in 3 weeks.  Thank you for choosing [place] at [facility] to provide your oncology and hematology care.  To afford each patient quality time with our provider, please arrive at least 15 minutes before your scheduled appointment time.   Beginning January 23rd 2017 lab work for the [facility] will be done in the  Main lab at [facility] on 1st floor. If you have a lab appointment with the [facility] please come in thru the  Main Entrance and check in at the main information desk  You need to re-schedule your appointment should you arrive 10 or more minutes late.  We strive to give you quality time with our providers, and arriving late affects you and other patients whose appointments are after yours.  Also, if you no show three or more times for appointments you may be dismissed from the clinic at the providers discretion.     Again, thank you for choosing [facility].  Our hope is that these requests will decrease the amount of time that you wait before being seen by our physicians.       _____________________________________________________________  Should you have questions after your visit to [facility], please contact our office at [phone] between the hours of 8:30 a.m. and 4:30 p.m.  Voicemails left after 4:30 p.m. will not be returned until the following business day.  For prescription refill requests, have your pharmacy contact our office.         Resources For Cancer Patients and their Caregivers ? American Cancer Society: Can assist with transportation, wigs, general needs, runs Look Good Feel Better.        2033277121 ? Cancer Care: Provides  financial assistance, online support groups, medication/co-pay assistance.  1-800-813-HOPE 484 441 2368) ? [place] Assists [place] cancer patients and their families through emotional , educational and financial support.  671-474-8832 ? [place] DSS Where to apply for food stamps, Medicaid and utility assistance. 763-583-1493 ? [employer]: Transportation to medical appointments. 260-624-4288 ? Social Security Administration: May apply for disability if have a Stage IV cancer. [phone] ? [employer], Disability and Transit Services: Assists with nutrition, care and transit needs. [place] Support Programs: @10RELATIVEDAYS @ > Cancer Support Group  2nd Tuesday of the month 1pm-2pm, Journey Room  > Creative Journey  3rd Tuesday of the month 1130am-1pm, Journey Room  > Look Good Feel Better  1st Wednesday of the month 10am-12 noon, Journey Room (Call [place] to register [phone])

## 2016-02-21 NOTE — Patient Instructions (Signed)
[facility] Discharge Instructions for Patients Receiving Chemotherapy   Beginning January 23rd 2017 lab work for the [facility] will be done in the  Main lab at [facility] on 1st floor. If you have a lab appointment with the [facility] please come in thru the  Main Entrance and check in at the main information desk   Today you received the following chemotherapy agents:  Taxotere, Cytoxan, and Herceptin.  If you develop nausea and vomiting, or diarrhea that is not controlled by your medication, call the clinic.  The clinic phone number is [phone]. Office hours are Monday-Friday 8:30am-5:00pm.  BELOW ARE SYMPTOMS THAT SHOULD BE REPORTED IMMEDIATELY:  *FEVER GREATER THAN 101.0 F  *CHILLS WITH OR WITHOUT FEVER  NAUSEA AND VOMITING THAT IS NOT CONTROLLED WITH YOUR NAUSEA MEDICATION  *UNUSUAL SHORTNESS OF BREATH  *UNUSUAL BRUISING OR BLEEDING  TENDERNESS IN MOUTH AND THROAT WITH OR WITHOUT PRESENCE OF ULCERS  *URINARY PROBLEMS  *BOWEL PROBLEMS  UNUSUAL RASH Items with * indicate a potential emergency and should be followed up as soon as possible. If you have an emergency after office hours please contact your primary care physician or go to the nearest emergency department.  Please call the clinic during office hours if you have any questions or concerns.   You may also contact the Patient Navigator at [phone] should you have any questions or need assistance in obtaining follow up care.      Resources For Cancer Patients and their Caregivers ? American Cancer Society: Can assist with transportation, wigs, general needs, runs Look Good Feel Better.        805-534-4753 ? Cancer Care: Provides financial assistance, online support groups, medication/co-pay assistance.  1-800-813-HOPE 2070496282) ? [place] Assists [place] cancer patients and their families through emotional , educational and financial support.   505-145-3624 ? [place] DSS Where to apply for food stamps, Medicaid and utility assistance. 313-582-8659 ? [employer]ATS: Transportation to medical appointments. 217-250-0684 ? Social Security Administration: May apply for disability if have a Stage IV cancer. (763)772-3135 [phone])[phone] ? [employer], Disability and Transit Services: Assists with nutrition, care and transit needs. 210-001-5776

## 2016-03-13 ENCOUNTER — Encounter (HOSPITAL_COMMUNITY): Payer: BLUE CROSS/BLUE SHIELD | Attending: Hematology & Oncology | Admitting: Hematology & Oncology

## 2016-03-13 ENCOUNTER — Encounter (HOSPITAL_COMMUNITY): Payer: Self-pay | Admitting: Hematology & Oncology

## 2016-03-13 ENCOUNTER — Encounter (HOSPITAL_BASED_OUTPATIENT_CLINIC_OR_DEPARTMENT_OTHER): Payer: BLUE CROSS/BLUE SHIELD

## 2016-03-13 VITALS — BP 124/62 | HR 83 | Temp 97.7°F | Resp 18 | Wt 163.4 lb

## 2016-03-13 DIAGNOSIS — L270 Generalized skin eruption due to drugs and medicaments taken internally: Secondary | ICD-10-CM | POA: Diagnosis not present

## 2016-03-13 DIAGNOSIS — Z5111 Encounter for antineoplastic chemotherapy: Secondary | ICD-10-CM | POA: Diagnosis not present

## 2016-03-13 DIAGNOSIS — C50919 Malignant neoplasm of unspecified site of unspecified female breast: Secondary | ICD-10-CM

## 2016-03-13 DIAGNOSIS — K219 Gastro-esophageal reflux disease without esophagitis: Secondary | ICD-10-CM

## 2016-03-13 DIAGNOSIS — C50412 Malignant neoplasm of upper-outer quadrant of left female breast: Secondary | ICD-10-CM

## 2016-03-13 DIAGNOSIS — Z5189 Encounter for other specified aftercare: Secondary | ICD-10-CM | POA: Diagnosis not present

## 2016-03-13 DIAGNOSIS — Z5112 Encounter for antineoplastic immunotherapy: Secondary | ICD-10-CM

## 2016-03-13 DIAGNOSIS — Z171 Estrogen receptor negative status [ER-]: Secondary | ICD-10-CM | POA: Diagnosis not present

## 2016-03-13 LAB — COMPREHENSIVE METABOLIC PANEL
ALT: 25 U/L (ref 14–54)
AST: 27 U/L (ref 15–41)
Albumin: 4.1 g/dL (ref 3.5–5.0)
Alkaline Phosphatase: 68 U/L (ref 38–126)
Anion gap: 8 (ref 5–15)
BUN: 14 mg/dL (ref 6–20)
CO2: 24 mmol/L (ref 22–32)
Calcium: 9.6 mg/dL (ref 8.9–10.3)
Chloride: 104 mmol/L (ref 101–111)
Creatinine, Ser: 0.59 mg/dL (ref 0.44–1.00)
GFR calc Af Amer: >60 mL/min (ref >60)
GFR calc non Af Amer: >60 mL/min (ref >60)
Glucose, Bld: 140 mg/dL — ABNORMAL HIGH (ref 65–99)
Potassium: 3.8 mmol/L (ref 3.5–5.1)
Sodium: 136 mmol/L (ref 135–145)
Total Bilirubin: 0.7 mg/dL (ref 0.3–1.2)
Total Protein: 7.4 g/dL (ref 6.5–8.1)

## 2016-03-13 LAB — CBC WITH DIFFERENTIAL/PLATELET
Basophils Absolute: 0 10*3/uL (ref 0.0–0.1)
Basophils Relative: 0 %
Eosinophils Absolute: 0 10*3/uL (ref 0.0–0.7)
Eosinophils Relative: 0 %
HCT: 38.6 % (ref 36.0–46.0)
Hemoglobin: 12.6 g/dL (ref 12.0–15.0)
Lymphocytes Relative: 15 %
Lymphs Abs: 2.1 10*3/uL (ref 0.7–4.0)
MCH: 32 pg (ref 26.0–34.0)
MCHC: 32.6 g/dL (ref 30.0–36.0)
MCV: 98 fL (ref 78.0–100.0)
Monocytes Absolute: 0.7 10*3/uL (ref 0.1–1.0)
Monocytes Relative: 5 %
Neutro Abs: 11.1 10*3/uL — ABNORMAL HIGH (ref 1.7–7.7)
Neutrophils Relative %: 80 %
Platelets: 227 10*3/uL (ref 150–400)
RBC: 3.94 MIL/uL (ref 3.87–5.11)
RDW: 14.2 % (ref 11.5–15.5)
WBC: 13.8 10*3/uL — ABNORMAL HIGH (ref 4.0–10.5)

## 2016-03-13 MED ORDER — DIPHENHYDRAMINE HCL 25 MG PO CAPS
ORAL_CAPSULE | ORAL | Status: AC
  Filled 2016-03-13: qty 2

## 2016-03-13 MED ORDER — PALONOSETRON HCL INJECTION 0.25 MG/5ML
0.2500 mg | Freq: Once | INTRAVENOUS | Status: AC
  Administered 2016-03-13: 0.25 mg via INTRAVENOUS

## 2016-03-13 MED ORDER — ACETAMINOPHEN 325 MG PO TABS
650.0000 mg | ORAL_TABLET | Freq: Once | ORAL | Status: AC
  Administered 2016-03-13: 650 mg via ORAL

## 2016-03-13 MED ORDER — PEGFILGRASTIM 6 MG/0.6ML ~~LOC~~ PSKT
6.0000 mg | PREFILLED_SYRINGE | Freq: Once | SUBCUTANEOUS | Status: AC
  Administered 2016-03-13: 6 mg via SUBCUTANEOUS

## 2016-03-13 MED ORDER — ACETAMINOPHEN 325 MG PO TABS
ORAL_TABLET | ORAL | Status: AC
  Filled 2016-03-13: qty 2

## 2016-03-13 MED ORDER — DIPHENHYDRAMINE HCL 25 MG PO CAPS
50.0000 mg | ORAL_CAPSULE | Freq: Once | ORAL | Status: AC
  Administered 2016-03-13: 50 mg via ORAL

## 2016-03-13 MED ORDER — DEXAMETHASONE SODIUM PHOSPHATE 10 MG/ML IJ SOLN
10.0000 mg | Freq: Once | INTRAMUSCULAR | Status: AC
Start: 2016-03-13 — End: 2016-03-13
  Administered 2016-03-13: 10 mg via INTRAVENOUS

## 2016-03-13 MED ORDER — DOCETAXEL CHEMO INJECTION 160 MG/16ML
75.0000 mg/m2 | Freq: Once | INTRAVENOUS | Status: AC
  Administered 2016-03-13: 140 mg via INTRAVENOUS
  Filled 2016-03-13: qty 14

## 2016-03-13 MED ORDER — SODIUM CHLORIDE 0.9 % IV SOLN
Freq: Once | INTRAVENOUS | Status: AC
Start: 2016-03-13 — End: 2016-03-13
  Administered 2016-03-13: 10:00:00 via INTRAVENOUS

## 2016-03-13 MED ORDER — HEPARIN SOD (PORK) LOCK FLUSH 100 UNIT/ML IV SOLN
500.0000 [IU] | Freq: Once | INTRAVENOUS | Status: AC | PRN
  Administered 2016-03-13: 500 [IU]
  Filled 2016-03-13: qty 5

## 2016-03-13 MED ORDER — SODIUM CHLORIDE 0.9 % IV SOLN: 10.0000 mg | Freq: Once | INTRAVENOUS | Status: DC

## 2016-03-13 MED ORDER — PALONOSETRON HCL INJECTION 0.25 MG/5ML
INTRAVENOUS | Status: AC
  Filled 2016-03-13: qty 5

## 2016-03-13 MED ORDER — TRASTUZUMAB CHEMO 150 MG IV SOLR
6.0000 mg/kg | Freq: Once | INTRAVENOUS | Status: AC
  Administered 2016-03-13: 441 mg via INTRAVENOUS
  Filled 2016-03-13: qty 21

## 2016-03-13 MED ORDER — PEGFILGRASTIM 6 MG/0.6ML ~~LOC~~ PSKT
PREFILLED_SYRINGE | SUBCUTANEOUS | Status: AC
  Filled 2016-03-13: qty 0.6

## 2016-03-13 MED ORDER — DEXAMETHASONE SODIUM PHOSPHATE 10 MG/ML IJ SOLN
INTRAMUSCULAR | Status: AC
  Filled 2016-03-13: qty 1

## 2016-03-13 MED ORDER — SODIUM CHLORIDE 0.9 % IV SOLN
558.5000 mg | Freq: Once | INTRAVENOUS | Status: AC
  Administered 2016-03-13: 560 mg via INTRAVENOUS
  Filled 2016-03-13: qty 56

## 2016-03-13 NOTE — Progress Notes (Signed)
[place]  PROGRESS NOTE  Patient Care Team: Sharilyn Sites, MD as PCP - General (Family Medicine)  CHIEF COMPLAINTS/PURPOSE OF CONSULTATION:  L breast carcinoma ER- PR- HER 2 +    Breast cancer of upper-outer quadrant of left female breast ([place])   11/07/1999 Surgery    Lumpectomy/sentinel LN biopsy invasive ductal carcinoma and DCIS at [facility], 0/6 sentinel LN      11/10/1999 - 01/08/2005 Anti-estrogen oral therapy    Tamoxifen 20 mg daily      11/24/1999 - 01/02/2000 Radiation Therapy         12/18/2015 Mammogram    2D digital screening bilateral mammogram with CAD and adjunct TOMO, in the L breast a possible mass warrants further evaluation. In R breast no findings suspicious for malignancy      12/24/2015 Pathology Results    Invasive ductal carcinoma Grade 3 Left core needle biopsy 1:30 o clock ER- PR- Ki67 70%, HER 2 positive      12/24/2015 Imaging    L breast Ultrasound, notes palpable area of concern demonstrates a hypoechoic irregular mass at 1:30, 7 cm from the nipple measuring 1.3 x 0.9 x 1.4 cm. No evidence of L axillary LAD      01/07/2016 Procedure    Left simple mastectomy by Dr. Georgette Dover      01/08/2016 Pathology Results    Breast, simple mastectomy, Left - INVASIVE DUCTAL CARCINOMA, GRADE III/III, SPANNING 1.4 CM. - THE SURGICAL RESECTION MARGINS ARE NEGATIVE FOR CARCINOMA.      01/23/2016 Genetic Testing    Patient refused genetic testing at time of consultation with Roma Kayser.  "Despite our recommendation, Ms. Friedlander did not wish to pursue genetic testing at today's visit."      01/28/2016 Echocardiogram    Left ventricle: The cavity size was normal. Wall thickness was normal. Systolic function was normal. The estimated ejection fraction was in the range of 55% to 60%. Wall motion was normal; there were no regional wall motion abnormalities. Doppler parameters are consistent with abnormal left ventricular relaxation  (grade 1 diastolic dysfunction).      01/31/2016 -  Chemotherapy    The patient had palonosetron (ALOXI) injection 0.25 mg, 0.25 mg, Intravenous,  Once, 1 of 6 cycles  pegfilgrastim (NEULASTA ONPRO KIT) injection 6 mg, 6 mg, Subcutaneous, Once, 1 of 6 cycles  CARBOplatin (PARAPLATIN) 560 mg in sodium chloride 0.9 % 250 mL chemo infusion, 560 mg (100 % of original dose 558.5 mg), Intravenous,  Once, 1 of 6 cycles Dose modification:   (original dose 558.5 mg, Cycle 1),   (original dose 558.5 mg, Cycle 2)  DOCEtaxel (TAXOTERE) 140 mg in dextrose 5 % 250 mL chemo infusion, 75 mg/m2 = 140 mg, Intravenous,  Once, 1 of 6 cycles  for chemotherapy treatment.        01/31/2016 -  Antibody Plan    Herceptin x 52 weeks       HISTORY OF PRESENTING ILLNESS:  [name] 60 y.o. female is here because for ongoing follow-up of ER- PR- HER 2 + L breast cancer.  She is here today accompanied by her husband and presents in the treatment chair. Today she is receiving cycle 3 of TCH. She says that she feels good.   She states that she's been good for the last 7 days.   She says that she gets a rash on her face after chemotherapy from dry skin. She puts some cream on it to help  moisturize.   She is still getting diarrhea. She states that it is "like water". She took an Imodium and it cleared up. She occasionally has constipation before the diarrhea starts. She remarks that when she takes immodium the diarrhea promptly resolves and is not an ongoing issue.   She says that her appetite has been very good in the past week. She says the the first 2 weeks after treatment she has a bad appetite because she has a "taste in her mouth".   She has not had any mouth sores this treatment cycle.  She states that her back aches from her neulasta shot.   She had some indigestion for a couple of days. She has been taking Prilosec and TUMS for this. She has never had an EGD.  No vomiting. No other complaints. No  fevers or chills.   MEDICAL HISTORY:  Past Medical History:  Diagnosis Date  . Anxiety   . Breast cancer ([place])   . Breast cancer of upper-outer quadrant of left female breast ([place]) 01/07/2016  . Complication of anesthesia   . DDD (degenerative disc disease), cervical   . DDD (degenerative disc disease), lumbar   . Depression   . Dysrhythmia    last cardiology ov 06-13-2013 in [facility], takes metoprolol for HR  . Family history of breast cancer   . Family history of colon cancer   . Fibromyalgia   . GERD (gastroesophageal reflux disease)   . Hematuria   . History of echocardiogram 2015   mild mital insufficiency, otherwise normal  . Leaky heart valve   . Multiple thyroid nodules   . PAC (premature atrial contraction)   . Palpitations   . PONV (postoperative nausea and vomiting)   . SOB (shortness of breath)   . Tachycardia     SURGICAL HISTORY: Past Surgical History:  Procedure Laterality Date  . BREAST LUMPECTOMY    . LAPAROSCOPIC APPENDECTOMY N/A 11/10/2012   Procedure: APPENDECTOMY LAPAROSCOPIC;  Surgeon: Jamesetta So, MD;  Location: [facility];  Service: General;  Laterality: N/A;  . MASTECTOMY    . MASTECTOMY W/ SENTINEL NODE BIOPSY Left 01/07/2016  . MASTECTOMY W/ SENTINEL NODE BIOPSY Left 01/07/2016   Procedure: LEFT TOTAL MASTECTOMY WITH LEFT AXILLARY SENTINEL LYMPH NODE BIOPSY;  Surgeon: Donnie Mesa, MD;  Location: [place];  Service: General;  Laterality: Left;  . PORTACATH PLACEMENT Right 01/07/2016   IJ  . PORTACATH PLACEMENT Right 01/07/2016   Procedure: INSERTION PORT-A-CATH RIGHT INTERNAL JUGULAR WITH ULTRASOUND;  Surgeon: Donnie Mesa, MD;  Location: [place];  Service: General;  Laterality: Right;    SOCIAL HISTORY: Social History   Social History  . Marital status: Married    Spouse name: N/A  . Number of children: 2  . Years of education: N/A   Occupational History  . Not on file.   Social History Main Topics  . Smoking status: Never Smoker  . Smokeless  tobacco: Never Used  . Alcohol use No  . Drug use: No  . Sexual activity: Yes     Comment: married   Other Topics Concern  . Not on file   Social History Narrative  . No narrative on file   She is married with two children and a fourth grandchild is on the way. She was born in [place] and has 7 siblings.   Patient enjoys staying home and taking care of husband's business. She has 2 outside pets. She has never smoked. Although husband doesn't express emotion much, he  is very supportive.  FAMILY HISTORY: Family History  Problem Relation Age of Onset  . Cancer Mother     colon  . Diabetes Father   . Diabetes Sister   . Alcohol abuse Brother   . Cancer Brother 18    lung cancer  . Cancer Brother     penile  . Breast cancer Maternal Aunt   . Cancer Other     unknown form of cancer   Her mom passed away at 59 from colon cancer. Her father was a diabetic and is also deceased. Two brothers are also deceased (1 was a smoker and died of lung cancer and the other was an alcoholic).   ALLERGIES:  is allergic to latex; naprosyn [naproxen]; other; and codeine.  MEDICATIONS:  Current Outpatient Prescriptions  Medication Sig Dispense Refill  . acetaminophen (TYLENOL) 500 MG tablet Take 500 mg by mouth every 6 (six) hours as needed for mild pain.    Marland Kitchen ALPRAZolam (XANAX) 0.5 MG tablet Take 0.5 tablets by mouth daily as needed for anxiety.     Marland Kitchen antiseptic oral rinse (BIOTENE) LIQD 15 mLs by Mouth Rinse route as needed for dry mouth.    . CARBOPLATIN IV Inject into the vein. Every 3 weeks    . cephALEXin (KEFLEX) 250 MG capsule take 1 capsule by mouth four times a day  0  . Cholecalciferol (VITAMIN D3) 1000 units CAPS Take 1,000 Units by mouth every morning.    Marland Kitchen dexamethasone (DECADRON) 4 MG tablet Take 2 tablets (8 mg total) by mouth 2 (two) times daily. Start the day before Taxotere. Then again the day after chemo for 3 days. 30 tablet 1  . DOCEtaxel (TAXOTERE IV) Inject into the  vein. Every 3 weeks    . doxycycline (VIBRA-TABS) 100 MG tablet Take 1 tablet (100 mg total) by mouth 2 (two) times daily. 10 tablet 0  . famotidine (PEPCID) 20 MG tablet Take 1 tablet (20 mg total) by mouth 2 (two) times daily. 60 tablet 2  . lidocaine-prilocaine (EMLA) cream Apply to affected area once 30 g 3  . metoprolol tartrate (LOPRESSOR) 25 MG tablet Take 12.5-25 mg by mouth 2 (two) times daily. 25 mg in the morning. 12.5 mg in the evening,    . omeprazole (PRILOSEC) 40 MG capsule Take 1 capsule (40 mg total) by mouth 2 (two) times daily before a meal. 60 capsule 2  . ondansetron (ZOFRAN) 8 MG tablet Take 1 tablet (8 mg total) by mouth 2 (two) times daily as needed for refractory nausea / vomiting. Start on day 3 after chemo. 30 tablet 1  . oxyCODONE (OXY IR/ROXICODONE) 5 MG immediate release tablet Take 1-2 tablets (5-10 mg total) by mouth every 4 (four) hours as needed for moderate pain. 30 tablet 0  . Pegfilgrastim (NEULASTA ONPRO [place]) Inject into the skin. Every 3 weeks    . Polyethyl Glycol-Propyl Glycol (SYSTANE) 0.4-0.3 % SOLN Apply 1 drop to eye at bedtime.    . prochlorperazine (COMPAZINE) 10 MG tablet Take 1 tablet (10 mg total) by mouth every 6 (six) hours as needed (Nausea or vomiting). 30 tablet 1  . Trastuzumab (HERCEPTIN IV) Inject into the vein. Every 3 weeks     No current facility-administered medications for this visit.    Facility-Administered Medications Ordered in Other Visits  Medication Dose Route Frequency Provider Last Rate Last Dose  . CARBOplatin (PARAPLATIN) 560 mg in sodium chloride 0.9 % 250 mL chemo infusion  560 mg Intravenous  Once Allene Pyo, MD 612 mL/hr at 03/13/16 1211 560 mg at 03/13/16 1211  . heparin lock flush 100 unit/mL  500 Units Intracatheter Once PRN Allene Pyo, MD        Review of Systems  Constitutional:       Bad appetite for 2 weeks after treatment. Bad taste in her mouth  HENT:       Denies mouth sores  Eyes: Negative.    Respiratory: Negative.   Cardiovascular: Negative.   Gastrointestinal: Positive for constipation and diarrhea.       Occasional constipation before diarrhea Occasional indigestion  Genitourinary: Negative.   Musculoskeletal: Positive for back pain.       Back aches secondary to shots.   Skin: Positive for rash.       Rash on face from dry skin after chemo  Neurological: Negative.   Endo/Heme/Allergies: Negative.   All other systems reviewed and are negative. 14 point ROS was done and is otherwise as detailed above or in HPI   PHYSICAL EXAMINATION: ECOG PERFORMANCE STATUS: 0 - Asymptomatic  Vitals with BMI 03/13/2016  Height   Weight 163 lbs 6 oz  BMI   Systolic 124  Diastolic 71  Pulse 87  Respirations 18    Physical Exam  Constitutional: She is oriented to person, place, and time and well-developed, well-nourished, and in no distress.  HENT:  Head: Normocephalic and atraumatic.  Nose: Nose normal.  Mouth/Throat: Oropharynx is clear and moist. No oropharyngeal exudate.  Eyes: Conjunctivae and EOM are normal. Pupils are equal, round, and reactive to light. Right eye exhibits no discharge. Left eye exhibits no discharge. No scleral icterus.  Neck: Normal range of motion. Neck supple. No tracheal deviation present. No thyromegaly present.  Cardiovascular: Normal rate, regular rhythm and normal heart sounds.  Exam reveals no gallop and no friction rub.   No murmur heard. Pulmonary/Chest: Effort normal and breath sounds normal. She has no wheezes. She has no rales.  Abdominal: Soft. Bowel sounds are normal. She exhibits no distension and no mass. There is no tenderness. There is no rebound and no guarding.  Musculoskeletal: Normal range of motion. She exhibits no edema.  Lymphadenopathy:    She has no cervical adenopathy.  Neurological: She is alert and oriented to person, place, and time. She has normal reflexes. No cranial nerve deficit. Gait normal. Coordination normal.    Skin: Skin is warm and dry.  Psychiatric: Mood, memory, affect and judgment normal.  Nursing note and vitals reviewed.    LABORATORY DATA:  I have reviewed the data as listed Lab Results  Component Value Date   WBC 13.8 (H) 03/13/2016   HGB 12.6 03/13/2016   HCT 38.6 03/13/2016   MCV 98.0 03/13/2016   PLT 227 03/13/2016   CMP     Component Value Date/Time   NA 136 03/13/2016 0910   K 3.8 03/13/2016 0910   CL 104 03/13/2016 0910   CO2 24 03/13/2016 0910   GLUCOSE 140 (H) 03/13/2016 0910   BUN 14 03/13/2016 0910   CREATININE 0.59 03/13/2016 0910   CALCIUM 9.6 03/13/2016 0910   PROT 7.4 03/13/2016 0910   ALBUMIN 4.1 03/13/2016 0910   AST 27 03/13/2016 0910   ALT 25 03/13/2016 0910   ALKPHOS 68 03/13/2016 0910   BILITOT 0.7 03/13/2016 0910   GFRNONAA >60 03/13/2016 0910   GFRAA >60 03/13/2016 0910     RADIOGRAPHIC STUDIES: I have personally reviewed the radiological images as listed and  agreed with the findings in the report.  CLINICAL DATA:  60 year old who simultaneously underwent left mastectomy for breast cancer and had a Port-A-Cath placed intraoperatively.  EXAM: PORTABLE CHEST 1 VIEW  COMPARISON:  01/12/2015, 12/31/2014 and earlier.  FINDINGS: Right jugular Port-A-Cath tip projects at or near the expected location of the cavoatrial junction. No evidence of pneumothorax or mediastinal hematoma. Postsurgical changes related to left mastectomy with skin staples overlying the left hemithorax. Suboptimal inspiration accounts for crowded bronchovascular markings, especially in the bases, and accentuates the cardiac silhouette. Taking this into account, cardiomediastinal silhouette unremarkable and lungs clear.  IMPRESSION: 1. Right jugular Port-A-Cath tip at or near the expected location of the cavoatrial junction. No acute complicating features. 2. Suboptimal inspiration.  No acute cardiopulmonary disease.   Electronically Signed   By: Evangeline Dakin M.D.   On: 01/07/2016 10:26 Study Result   CLINICAL DATA:    FLOURO GUIDE CV LINE  Fluoroscopy was utilized by the requesting physician.  No radiographic  interpretation.    Study Result   CLINICAL DATA:    FLOURO GUIDE CV LINE  Fluoroscopy was utilized by the requesting physician.  No radiographic  interpretation.      PATHOLOGY:     ASSESSMENT & PLAN:  L breast cancer upper outer quadrant ER- PR- HER 2 + Prior history L breast cancer ER+, lumpectomy, XRT, Tamoxifen TCH  Anxiety about health GERD  She is doing better with therapy overall. Skin side effects have to be monitored.  She does not need any refills today.  If she continues to have indigestion, then we will order her an EGD.  Proceed with next cycle of therapy today. Labs reviewed. Results noted above.  She will return for a follow up on December 1.   Orders Placed This Encounter  Procedures  . CBC with Differential    Standing Status:   Future    Number of Occurrences:   1    Standing Expiration Date:   03/13/2017  . Comprehensive metabolic panel    Standing Status:   Future    Number of Occurrences:   1    Standing Expiration Date:   03/13/2017     All questions were answered. The patient knows to call the clinic with any problems, questions or concerns.  This document serves as a record of services personally performed by Ancil Linsey, MD. It was created on her behalf by Martinique Casey, a trained medical scribe. The creation of this record is based on the scribe's personal observations and the provider's statements to them. This document has been checked and approved by the attending provider.  I have reviewed the above documentation for accuracy and completeness and I agree with the above.  This note was electronically signed.    Molli Hazard, MD  03/13/2016 12:21 PM

## 2016-03-13 NOTE — Progress Notes (Signed)
Tolerated tx w/o adverse reaction.  Alert, in no distress.  VSS.  Discharged ambulatory in c/o family.  

## 2016-03-13 NOTE — Patient Instructions (Signed)
[facility] Discharge Instructions for Patients Receiving Chemotherapy   Beginning January 23rd 2017 lab work for the [facility] will be done in the  Main lab at [facility] on 1st floor. If you have a lab appointment with the [facility] please come in thru the  Main Entrance and check in at the main information desk   Today you received the following chemotherapy agents:  Taxotere, carboplatin, and Herceptin  If you develop nausea and vomiting, or diarrhea that is not controlled by your medication, call the clinic.  The clinic phone number is [phone]. Office hours are Monday-Friday 8:30am-5:00pm.  BELOW ARE SYMPTOMS THAT SHOULD BE REPORTED IMMEDIATELY:  *FEVER GREATER THAN 101.0 F  *CHILLS WITH OR WITHOUT FEVER  NAUSEA AND VOMITING THAT IS NOT CONTROLLED WITH YOUR NAUSEA MEDICATION  *UNUSUAL SHORTNESS OF BREATH  *UNUSUAL BRUISING OR BLEEDING  TENDERNESS IN MOUTH AND THROAT WITH OR WITHOUT PRESENCE OF ULCERS  *URINARY PROBLEMS  *BOWEL PROBLEMS  UNUSUAL RASH Items with * indicate a potential emergency and should be followed up as soon as possible. If you have an emergency after office hours please contact your primary care physician or go to the nearest emergency department.  Please call the clinic during office hours if you have any questions or concerns.   You may also contact the Patient Navigator at [phone] should you have any questions or need assistance in obtaining follow up care.      Resources For Cancer Patients and their Caregivers ? American Cancer Society: Can assist with transportation, wigs, general needs, runs Look Good Feel Better.        815-739-9063 ? Cancer Care: Provides financial assistance, online support groups, medication/co-pay assistance.  1-800-813-HOPE (815)882-7509) ? [place] Assists [place] cancer patients and their families through emotional , educational and financial  support.  475 025 2102 ? [place] DSS Where to apply for food stamps, Medicaid and utility assistance. 213-833-3036 ? RCATS: Transportation to medical appointments. (626)614-4673 ? Social Security Administration: May apply for disability if have a Stage IV cancer. 438-793-2096 769-346-6534 ? [employer], Disability and Transit Services: Assists with nutrition, care and transit needs. 6306521540

## 2016-03-13 NOTE — Patient Instructions (Signed)
[facility] at [facility] Discharge Instructions  RECOMMENDATIONS MADE BY THE CONSULTANT AND ANY TEST RESULTS WILL BE SENT TO YOUR REFERRING PHYSICIAN.  You saw Dr.Penland today. Follow up in 3 weeks with labs, chemo and MD appt.  See Amy at checkout for appointments.  Thank you for choosing [facility] at [facility] to provide your oncology and hematology care.  To afford each patient quality time with our provider, please arrive at least 15 minutes before your scheduled appointment time.   Beginning January 23rd 2017 lab work for the Cancer Center will be done in the  Main lab at [place] on 1st floor. If you have a lab appointment with the Cancer Center please come in thru the  Main Entrance and check in at the main information desk  You need to re-schedule your appointment should you arrive 10 or more minutes late.  We strive to give you quality time with our providers, and arriving late affects you and other patients whose appointments are after yours.  Also, if you no show three or more times for appointments you may be dismissed from the clinic at the providers discretion.     Again, thank you for choosing [facility].  Our hope is that these requests will decrease the amount of time that you wait before being seen by our physicians.       _____________________________________________________________  Should you have questions after your visit to  Cancer Center, please contact our office at [phone] between the hours of 8:30 a.m. and 4:30 p.m.  Voicemails left after 4:30 p.m. will not be returned until the following business day.  For prescription refill requests, have your pharmacy contact our office.         Resources For Cancer Patients and their Caregivers ? American Cancer Society: Can assist with transportation, wigs, general needs, runs Look Good Feel Better.        1-888-227-6333 ? Cancer  Care: Provides financial assistance, online support groups, medication/co-pay assistance.  1-800-813-HOPE (4673) ? [facility] Assists [place] cancer patients and their families through emotional , educational and financial support.  336-427-4357 ? [place] DSS Where to apply for food stamps, Medicaid and utility assistance. 336-342-1394 ? RCATS: Transportation to medical appointments. 336-347-2287 ? Social Security Administration: May apply for disability if have a Stage IV cancer. 336-342-7796 1-800-772-1213 ? [place] Aging, Disability and Transit Services: Assists with nutrition, care and transit needs. 336-349-2343  Cancer Center Support Programs: @10RELATIVEDAYS@ > Cancer Support Group  2nd Tuesday of the month 1pm-2pm, Journey Room  > Creative Journey  3rd Tuesday of the month 1130am-1pm, Journey Room  > Look Good Feel Better  1st Wednesday of the month 10am-12 noon, Journey Room (Call American Cancer Society to register 1-800-395-5775)    

## 2016-04-01 DIAGNOSIS — C50912 Malignant neoplasm of unspecified site of left female breast: Secondary | ICD-10-CM | POA: Diagnosis not present

## 2016-04-03 ENCOUNTER — Encounter (HOSPITAL_BASED_OUTPATIENT_CLINIC_OR_DEPARTMENT_OTHER): Payer: BLUE CROSS/BLUE SHIELD | Admitting: Hematology & Oncology

## 2016-04-03 ENCOUNTER — Encounter (HOSPITAL_COMMUNITY): Payer: Self-pay | Admitting: Hematology & Oncology

## 2016-04-03 ENCOUNTER — Encounter (HOSPITAL_COMMUNITY): Payer: BLUE CROSS/BLUE SHIELD | Attending: Hematology & Oncology

## 2016-04-03 VITALS — BP 126/68 | HR 82 | Temp 98.0°F | Resp 16

## 2016-04-03 VITALS — BP 129/70 | HR 81 | Temp 98.2°F | Resp 16 | Wt 162.4 lb

## 2016-04-03 DIAGNOSIS — R309 Painful micturition, unspecified: Secondary | ICD-10-CM

## 2016-04-03 DIAGNOSIS — Z171 Estrogen receptor negative status [ER-]: Secondary | ICD-10-CM

## 2016-04-03 DIAGNOSIS — R300 Dysuria: Secondary | ICD-10-CM

## 2016-04-03 DIAGNOSIS — C50919 Malignant neoplasm of unspecified site of unspecified female breast: Secondary | ICD-10-CM

## 2016-04-03 DIAGNOSIS — C50412 Malignant neoplasm of upper-outer quadrant of left female breast: Secondary | ICD-10-CM

## 2016-04-03 DIAGNOSIS — Z5111 Encounter for antineoplastic chemotherapy: Secondary | ICD-10-CM | POA: Diagnosis not present

## 2016-04-03 DIAGNOSIS — Z5112 Encounter for antineoplastic immunotherapy: Secondary | ICD-10-CM | POA: Diagnosis not present

## 2016-04-03 DIAGNOSIS — R002 Palpitations: Secondary | ICD-10-CM

## 2016-04-03 DIAGNOSIS — L270 Generalized skin eruption due to drugs and medicaments taken internally: Secondary | ICD-10-CM | POA: Diagnosis not present

## 2016-04-03 DIAGNOSIS — K219 Gastro-esophageal reflux disease without esophagitis: Secondary | ICD-10-CM

## 2016-04-03 LAB — CBC WITH DIFFERENTIAL/PLATELET
Basophils Absolute: 0 10*3/uL (ref 0.0–0.1)
Basophils Relative: 0 %
EOS ABS: 0 10*3/uL (ref 0.0–0.7)
EOS PCT: 0 %
HCT: 37.8 % (ref 36.0–46.0)
Hemoglobin: 12.4 g/dL (ref 12.0–15.0)
LYMPHS ABS: 2.4 10*3/uL (ref 0.7–4.0)
LYMPHS PCT: 20 %
MCH: 32.7 pg (ref 26.0–34.0)
MCHC: 32.8 g/dL (ref 30.0–36.0)
MCV: 99.7 fL (ref 78.0–100.0)
MONOS PCT: 8 %
Monocytes Absolute: 0.9 10*3/uL (ref 0.1–1.0)
Neutro Abs: 8.2 10*3/uL — ABNORMAL HIGH (ref 1.7–7.7)
Neutrophils Relative %: 72 %
PLATELETS: 245 10*3/uL (ref 150–400)
RBC: 3.79 MIL/uL — ABNORMAL LOW (ref 3.87–5.11)
RDW: 16 % — ABNORMAL HIGH (ref 11.5–15.5)
WBC: 11.5 10*3/uL — ABNORMAL HIGH (ref 4.0–10.5)

## 2016-04-03 LAB — URINALYSIS, ROUTINE W REFLEX MICROSCOPIC
Bilirubin Urine: NEGATIVE
Glucose, UA: NEGATIVE mg/dL
Ketones, ur: NEGATIVE mg/dL
Nitrite: NEGATIVE
Protein, ur: NEGATIVE mg/dL
Specific Gravity, Urine: <1.005 — ABNORMAL LOW (ref 1.005–1.030)
pH: 6 (ref 5.0–8.0)

## 2016-04-03 LAB — COMPREHENSIVE METABOLIC PANEL
ALK PHOS: 61 U/L (ref 38–126)
ALT: 25 U/L (ref 14–54)
ANION GAP: 9 (ref 5–15)
AST: 25 U/L (ref 15–41)
Albumin: 4 g/dL (ref 3.5–5.0)
BUN: 12 mg/dL (ref 6–20)
CALCIUM: 9.4 mg/dL (ref 8.9–10.3)
CHLORIDE: 104 mmol/L (ref 101–111)
CO2: 22 mmol/L (ref 22–32)
CREATININE: 0.74 mg/dL (ref 0.44–1.00)
Glucose, Bld: 114 mg/dL — ABNORMAL HIGH (ref 65–99)
Potassium: 3.8 mmol/L (ref 3.5–5.1)
SODIUM: 135 mmol/L (ref 135–145)
Total Bilirubin: 0.7 mg/dL (ref 0.3–1.2)
Total Protein: 7.2 g/dL (ref 6.5–8.1)

## 2016-04-03 LAB — T4, FREE: Free T4: 0.79 ng/dL (ref 0.61–1.12)

## 2016-04-03 LAB — URINE MICROSCOPIC-ADD ON
Bacteria, UA: NONE SEEN
Squamous Epithelial / HPF: NONE SEEN

## 2016-04-03 LAB — TSH: TSH: 0.508 u[IU]/mL (ref 0.350–4.500)

## 2016-04-03 MED ORDER — ALPRAZOLAM 0.5 MG PO TABS: 0.5000 mg | ORAL_TABLET | Freq: Three times a day (TID) | ORAL | 1 refills | Status: DC | PRN

## 2016-04-03 MED ORDER — ACETAMINOPHEN 325 MG PO TABS
ORAL_TABLET | ORAL | Status: AC
  Filled 2016-04-03: qty 2

## 2016-04-03 MED ORDER — ACETAMINOPHEN 325 MG PO TABS
650.0000 mg | ORAL_TABLET | Freq: Once | ORAL | Status: AC
  Administered 2016-04-03: 650 mg via ORAL

## 2016-04-03 MED ORDER — SODIUM CHLORIDE 0.9% FLUSH
10.0000 mL | INTRAVENOUS | Status: DC | PRN
  Administered 2016-04-03: 10 mL
  Filled 2016-04-03: qty 10

## 2016-04-03 MED ORDER — DEXAMETHASONE SODIUM PHOSPHATE 10 MG/ML IJ SOLN
INTRAMUSCULAR | Status: AC
  Filled 2016-04-03: qty 1

## 2016-04-03 MED ORDER — DEXAMETHASONE SODIUM PHOSPHATE 10 MG/ML IJ SOLN
10.0000 mg | Freq: Once | INTRAMUSCULAR | Status: AC
  Administered 2016-04-03: 10 mg via INTRAVENOUS

## 2016-04-03 MED ORDER — HEPARIN SOD (PORK) LOCK FLUSH 100 UNIT/ML IV SOLN
INTRAVENOUS | Status: AC
Start: 2016-04-03 — End: 2016-04-03
  Filled 2016-04-03: qty 5

## 2016-04-03 MED ORDER — PALONOSETRON HCL INJECTION 0.25 MG/5ML
INTRAVENOUS | Status: AC
  Filled 2016-04-03: qty 5

## 2016-04-03 MED ORDER — TRIAMCINOLONE ACETONIDE 0.5 % EX OINT: 1.0000 "application " | TOPICAL_OINTMENT | Freq: Two times a day (BID) | CUTANEOUS | 4 refills | Status: DC

## 2016-04-03 MED ORDER — SODIUM CHLORIDE 0.9 % IV SOLN
558.5000 mg | Freq: Once | INTRAVENOUS | Status: AC
  Administered 2016-04-03: 560 mg via INTRAVENOUS
  Filled 2016-04-03: qty 56

## 2016-04-03 MED ORDER — SODIUM CHLORIDE 0.9 % IV SOLN: 10.0000 mg | Freq: Once | INTRAVENOUS | Status: DC

## 2016-04-03 MED ORDER — PALONOSETRON HCL INJECTION 0.25 MG/5ML
0.2500 mg | Freq: Once | INTRAVENOUS | Status: AC
  Administered 2016-04-03: 0.25 mg via INTRAVENOUS

## 2016-04-03 MED ORDER — SODIUM CHLORIDE 0.9 % IV SOLN
Freq: Once | INTRAVENOUS | Status: AC
  Administered 2016-04-03: 11:00:00 via INTRAVENOUS

## 2016-04-03 MED ORDER — HEPARIN SOD (PORK) LOCK FLUSH 100 UNIT/ML IV SOLN
500.0000 [IU] | Freq: Once | INTRAVENOUS | Status: AC | PRN
  Administered 2016-04-03: 500 [IU]
  Filled 2016-04-03: qty 5

## 2016-04-03 MED ORDER — DIPHENHYDRAMINE HCL 25 MG PO CAPS
ORAL_CAPSULE | ORAL | Status: AC
  Filled 2016-04-03: qty 2

## 2016-04-03 MED ORDER — DIPHENHYDRAMINE HCL 25 MG PO CAPS
50.0000 mg | ORAL_CAPSULE | Freq: Once | ORAL | Status: AC
  Administered 2016-04-03: 50 mg via ORAL

## 2016-04-03 MED ORDER — DOCETAXEL CHEMO INJECTION 160 MG/16ML
75.0000 mg/m2 | Freq: Once | INTRAVENOUS | Status: AC
  Administered 2016-04-03: 140 mg via INTRAVENOUS
  Filled 2016-04-03: qty 14

## 2016-04-03 MED ORDER — PEGFILGRASTIM 6 MG/0.6ML ~~LOC~~ PSKT
6.0000 mg | PREFILLED_SYRINGE | Freq: Once | SUBCUTANEOUS | Status: AC
  Administered 2016-04-03: 6 mg via SUBCUTANEOUS
  Filled 2016-04-03: qty 0.6

## 2016-04-03 MED ORDER — TRASTUZUMAB CHEMO 150 MG IV SOLR
6.0000 mg/kg | Freq: Once | INTRAVENOUS | Status: AC
  Administered 2016-04-03: 441 mg via INTRAVENOUS
  Filled 2016-04-03: qty 21

## 2016-04-03 NOTE — Patient Instructions (Signed)
[facility] Discharge Instructions for Patients Receiving Chemotherapy   Beginning January 23rd 2017 lab work for the [facility] will be done in the  Main lab at [facility] on 1st floor. If you have a lab appointment with the [facility] please come in thru the  Main Entrance and check in at the main information desk   Today you received the following chemotherapy agents Taxotere,Carboplatin and Herceptin as well as Neulasta on-pro. Follow-up as scheduled. Call clinic for any questions or concerns  To help prevent nausea and vomiting after your treatment, we encourage you to take your nausea medication   If you develop nausea and vomiting, or diarrhea that is not controlled by your medication, call the clinic.  The clinic phone number is [phone]. Office hours are Monday-Friday 8:30am-5:00pm.  BELOW ARE SYMPTOMS THAT SHOULD BE REPORTED IMMEDIATELY:  *FEVER GREATER THAN 101.0 F  *CHILLS WITH OR WITHOUT FEVER  NAUSEA AND VOMITING THAT IS NOT CONTROLLED WITH YOUR NAUSEA MEDICATION  *UNUSUAL SHORTNESS OF BREATH  *UNUSUAL BRUISING OR BLEEDING  TENDERNESS IN MOUTH AND THROAT WITH OR WITHOUT PRESENCE OF ULCERS  *URINARY PROBLEMS  *BOWEL PROBLEMS  UNUSUAL RASH Items with * indicate a potential emergency and should be followed up as soon as possible. If you have an emergency after office hours please contact your primary care physician or go to the nearest emergency department.  Please call the clinic during office hours if you have any questions or concerns.   You may also contact the Patient Navigator at [phone] should you have any questions or need assistance in obtaining follow up care.      Resources For Cancer Patients and their Caregivers ? American Cancer Society: Can assist with transportation, wigs, general needs, runs Look Good Feel Better.        249-618-3785 ? Cancer Care: Provides financial assistance, online support groups,  medication/co-pay assistance.  1-800-813-HOPE (212)443-4097) ? [place] Assists [place] cancer patients and their families through emotional , educational and financial support.  402-004-0673 ? [place] DSS Where to apply for food stamps, Medicaid and utility assistance. 502-806-7766 ? RCATS: Transportation to medical appointments. 760-742-8353 ? Social Security Administration: May apply for disability if have a Stage IV cancer. 601 832 8588 (334) 610-5349 ? [employer], Disability and Transit Services: Assists with nutrition, care and transit needs. 475-266-8065

## 2016-04-03 NOTE — Progress Notes (Signed)
[name] tolerated chemo tx  with Neulasta on-pro well without complaints or incident.Labs reviewed prior to administering chemotherapy. VSS upon discharge. Neulasta on-pro applied to right arm with green light indicator flashing. Pt discharged self ambulatory in satisfactory condition accompanied by husband

## 2016-04-03 NOTE — Patient Instructions (Addendum)
[place] at [facility] Discharge Instructions  RECOMMENDATIONS MADE BY THE CONSULTANT AND ANY TEST RESULTS WILL BE SENT TO YOUR REFERRING PHYSICIAN.  You saw Dr.Penland today. Follow up in 3 weeks with labs, chemo, and MD appointment. We will call with results of UA and culture. See Amy at checkout for appointments.  Thank you for choosing [place] at [facility] to provide your oncology and hematology care.  To afford each patient quality time with our provider, please arrive at least 15 minutes before your scheduled appointment time.   Beginning January 23rd 2017 lab work for the [facility] will be done in the  Main lab at [facility] on 1st floor. If you have a lab appointment with the [facility] please come in thru the  Main Entrance and check in at the main information desk  You need to re-schedule your appointment should you arrive 10 or more minutes late.  We strive to give you quality time with our providers, and arriving late affects you and other patients whose appointments are after yours.  Also, if you no show three or more times for appointments you may be dismissed from the clinic at the providers discretion.     Again, thank you for choosing [facility].  Our hope is that these requests will decrease the amount of time that you wait before being seen by our physicians.       _____________________________________________________________  Should you have questions after your visit to [facility], please contact our office at [phone] between the hours of 8:30 a.m. and 4:30 p.m.  Voicemails left after 4:30 p.m. will not be returned until the following business day.  For prescription refill requests, have your pharmacy contact our office.         Resources For Cancer Patients and their Caregivers ? American Cancer Society: Can assist with transportation, wigs, general needs, runs Look Good  Feel Better.        4805290551 ? Cancer Care: Provides financial assistance, online support groups, medication/co-pay assistance.  1-800-813-HOPE 941 017 2093) ? [employer] Assists [place] cancer patients and their families through emotional , educational and financial support.  (814)126-5653 ? [place] DSS Where to apply for food stamps, Medicaid and utility assistance. 4185667993 ? [facility]: Transportation to medical appointments. 512-408-9855 ? Social Security Administration: May apply for disability if have a Stage IV cancer. 210-396-0115 8107201886 ? [employer], Disability and Transit Services: Assists with nutrition, care and transit needs. [place] Support Programs: @10RELATIVEDAYS @ > Cancer Support Group  2nd Tuesday of the month 1pm-2pm, Journey Room  > Creative Journey  3rd Tuesday of the month 1130am-1pm, Journey Room  > Look Good Feel Better  1st Wednesday of the month 10am-12 noon, Journey Room (Call [place] to register [phone])

## 2016-04-03 NOTE — Progress Notes (Signed)
[name]  PROGRESS NOTE  Patient Care Team: Sharilyn Sites, MD as PCP - General (Family Medicine)  CHIEF COMPLAINTS/PURPOSE OF CONSULTATION:  L breast carcinoma ER- PR- HER 2 +    Breast cancer of upper-outer quadrant of left female breast ([place])   11/07/1999 Surgery    Lumpectomy/sentinel LN biopsy invasive ductal carcinoma and DCIS at [facility], 0/6 sentinel LN      11/10/1999 - 01/08/2005 Anti-estrogen oral therapy    Tamoxifen 20 mg daily      11/24/1999 - 01/02/2000 Radiation Therapy         12/18/2015 Mammogram    2D digital screening bilateral mammogram with CAD and adjunct TOMO, in the L breast a possible mass warrants further evaluation. In R breast no findings suspicious for malignancy      12/24/2015 Pathology Results    Invasive ductal carcinoma Grade 3 Left core needle biopsy 1:30 o clock ER- PR- Ki67 70%, HER 2 positive      12/24/2015 Imaging    L breast Ultrasound, notes palpable area of concern demonstrates a hypoechoic irregular mass at 1:30, 7 cm from the nipple measuring 1.3 x 0.9 x 1.4 cm. No evidence of L axillary LAD      01/07/2016 Procedure    Left simple mastectomy by Dr. Georgette Dover      01/08/2016 Pathology Results    Breast, simple mastectomy, Left - INVASIVE DUCTAL CARCINOMA, GRADE III/III, SPANNING 1.4 CM. - THE SURGICAL RESECTION MARGINS ARE NEGATIVE FOR CARCINOMA.      01/23/2016 Genetic Testing    Patient refused genetic testing at time of consultation with Roma Kayser.  "Despite our recommendation, Ms. Cosby did not wish to pursue genetic testing at today's visit."      01/28/2016 Echocardiogram    Left ventricle: The cavity size was normal. Wall thickness was normal. Systolic function was normal. The estimated ejection fraction was in the range of 55% to 60%. Wall motion was normal; there were no regional wall motion abnormalities. Doppler parameters are consistent with abnormal left ventricular relaxation  (grade 1 diastolic dysfunction).      01/31/2016 -  Chemotherapy    The patient had palonosetron (ALOXI) injection 0.25 mg, 0.25 mg, Intravenous,  Once, 1 of 6 cycles  pegfilgrastim (NEULASTA ONPRO KIT) injection 6 mg, 6 mg, Subcutaneous, Once, 1 of 6 cycles  CARBOplatin (PARAPLATIN) 560 mg in sodium chloride 0.9 % 250 mL chemo infusion, 560 mg (100 % of original dose 558.5 mg), Intravenous,  Once, 1 of 6 cycles Dose modification:   (original dose 558.5 mg, Cycle 1),   (original dose 558.5 mg, Cycle 2)  DOCEtaxel (TAXOTERE) 140 mg in dextrose 5 % 250 mL chemo infusion, 75 mg/m2 = 140 mg, Intravenous,  Once, 1 of 6 cycles  for chemotherapy treatment.        01/31/2016 -  Antibody Plan    Herceptin x 52 weeks      HISTORY OF PRESENTING ILLNESS:  [name] 60 y.o. female is here for ongoing follow-up of ER- PR- HER 2 + L breast cancer.  [name] returns to the cancer center today accompanied by her husband. I have reviewed the labs with the patient. She is here today for ongoing [place].   Her eyes and nose have been running.  She has redness on her face, neck, and chest. It itches, as well. She says that this comes and goes for 4-5 days at a time.   She has swelling  on her inner left arm. It causes some soreness. She wears a compression glove for this. She has been using her arm as much as she can to help keep the swelling down.   The first week after chemotherapy, she has no appetite. She notes however that this slowly improves and her appetite returns.   There is some swelling on her ankles. It is worse at night than in the morning.   She has been experiencing acid reflux. This has been ongoing throughout therapy. She is on omeprazole twice daily and famotidine.   She has pain on the left side of her body. It comes and goes, but "really hurts". She notes that this pain is worse after her neulasta injection.  She reports some mild burning with urination.   MEDICAL  HISTORY:  Past Medical History:  Diagnosis Date  . Anxiety   . Breast cancer ([place])   . Breast cancer of upper-outer quadrant of left female breast ([place]) 01/07/2016  . Complication of anesthesia   . DDD (degenerative disc disease), cervical   . DDD (degenerative disc disease), lumbar   . Depression   . Dysrhythmia    last cardiology ov 06-13-2013 in [facility], takes metoprolol for HR  . Family history of breast cancer   . Family history of colon cancer   . Fibromyalgia   . GERD (gastroesophageal reflux disease)   . Hematuria   . History of echocardiogram 2015   mild mital insufficiency, otherwise normal  . Leaky heart valve   . Multiple thyroid nodules   . PAC (premature atrial contraction)   . Palpitations   . PONV (postoperative nausea and vomiting)   . SOB (shortness of breath)   . Tachycardia     SURGICAL HISTORY: Past Surgical History:  Procedure Laterality Date  . BREAST LUMPECTOMY    . LAPAROSCOPIC APPENDECTOMY N/A 11/10/2012   Procedure: APPENDECTOMY LAPAROSCOPIC;  Surgeon: Jamesetta So, MD;  Location: [facility];  Service: General;  Laterality: N/A;  . MASTECTOMY    . MASTECTOMY W/ SENTINEL NODE BIOPSY Left 01/07/2016  . MASTECTOMY W/ SENTINEL NODE BIOPSY Left 01/07/2016   Procedure: LEFT TOTAL MASTECTOMY WITH LEFT AXILLARY SENTINEL LYMPH NODE BIOPSY;  Surgeon: Donnie Mesa, MD;  Location: [facility];  Service: General;  Laterality: Left;  . PORTACATH PLACEMENT Right 01/07/2016   IJ  . PORTACATH PLACEMENT Right 01/07/2016   Procedure: INSERTION PORT-A-CATH RIGHT INTERNAL JUGULAR WITH ULTRASOUND;  Surgeon: Donnie Mesa, MD;  Location: [facility];  Service: General;  Laterality: Right;    SOCIAL HISTORY: Social History   Social History  . Marital status: Married    Spouse name: N/A  . Number of children: 2  . Years of education: N/A   Occupational History  . Not on file.   Social History Main Topics  . Smoking status: Never Smoker  . Smokeless tobacco: Never Used  . Alcohol  use No  . Drug use: No  . Sexual activity: Yes     Comment: married   Other Topics Concern  . Not on file   Social History Narrative  . No narrative on file   She is married with two children and a fourth grandchild is on the way. She was born in [place] and has 7 siblings.   Patient enjoys staying home and taking care of husband's business. She has 2 outside pets. She has never smoked. Although husband doesn't express emotion much, he is very supportive.  FAMILY HISTORY: Family History  Problem Relation  Age of Onset  . Cancer Mother     colon  . Diabetes Father   . Diabetes Sister   . Alcohol abuse Brother   . Cancer Brother 26    lung cancer  . Cancer Brother     penile  . Breast cancer Maternal Aunt   . Cancer Other     unknown form of cancer   Her mom passed away at 80 from colon cancer. Her father was a diabetic and is also deceased. Two brothers are also deceased (1 was a smoker and died of lung cancer and the other was an alcoholic).   ALLERGIES:  is allergic to latex; naprosyn [naproxen]; other; and codeine.  MEDICATIONS:  Current Outpatient Prescriptions  Medication Sig Dispense Refill  . acetaminophen (TYLENOL) 500 MG tablet Take 500 mg by mouth every 6 (six) hours as needed for mild pain.    Marland Kitchen ALPRAZolam (XANAX) 0.5 MG tablet Take 0.5 tablets by mouth daily as needed for anxiety.     Marland Kitchen antiseptic oral rinse (BIOTENE) LIQD 15 mLs by Mouth Rinse route as needed for dry mouth.    . CARBOPLATIN IV Inject into the vein. Every 3 weeks    . cephALEXin (KEFLEX) 250 MG capsule take 1 capsule by mouth four times a day  0  . Cholecalciferol (VITAMIN D3) 1000 units CAPS Take 1,000 Units by mouth every morning.    Marland Kitchen dexamethasone (DECADRON) 4 MG tablet Take 2 tablets (8 mg total) by mouth 2 (two) times daily. Start the day before Taxotere. Then again the day after chemo for 3 days. 30 tablet 1  . DOCEtaxel (TAXOTERE IV) Inject into the vein. Every 3 weeks    .  doxycycline (VIBRA-TABS) 100 MG tablet Take 1 tablet (100 mg total) by mouth 2 (two) times daily. 10 tablet 0  . famotidine (PEPCID) 20 MG tablet Take 1 tablet (20 mg total) by mouth 2 (two) times daily. 60 tablet 2  . lidocaine-prilocaine (EMLA) cream Apply to affected area once 30 g 3  . metoprolol tartrate (LOPRESSOR) 25 MG tablet Take 12.5-25 mg by mouth 2 (two) times daily. 25 mg in the morning. 12.5 mg in the evening,    . omeprazole (PRILOSEC) 40 MG capsule Take 1 capsule (40 mg total) by mouth 2 (two) times daily before a meal. 60 capsule 2  . ondansetron (ZOFRAN) 8 MG tablet Take 1 tablet (8 mg total) by mouth 2 (two) times daily as needed for refractory nausea / vomiting. Start on day 3 after chemo. 30 tablet 1  . oxyCODONE (OXY IR/ROXICODONE) 5 MG immediate release tablet Take 1-2 tablets (5-10 mg total) by mouth every 4 (four) hours as needed for moderate pain. 30 tablet 0  . Pegfilgrastim (NEULASTA ONPRO [place]) Inject into the skin. Every 3 weeks    . Polyethyl Glycol-Propyl Glycol (SYSTANE) 0.4-0.3 % SOLN Apply 1 drop to eye at bedtime.    . prochlorperazine (COMPAZINE) 10 MG tablet Take 1 tablet (10 mg total) by mouth every 6 (six) hours as needed (Nausea or vomiting). 30 tablet 1  . Trastuzumab (HERCEPTIN IV) Inject into the vein. Every 3 weeks     No current facility-administered medications for this visit.     Review of Systems  Constitutional: Negative.        No appetite week after chemo  HENT: Negative.        Rhinnorrhea  Eyes: Negative.        Epiphora  Respiratory: Negative.  Cardiovascular: Negative.   Gastrointestinal: Positive for heartburn (acid reflux).  Genitourinary: Negative.   Musculoskeletal: Negative.        Swelling in left arm.  Swelling in ankles.  Intermittent left sided pain.   Skin: Positive for itching and rash.       Redness and blisters on face, neck, and chest.  Neurological: Negative.   Endo/Heme/Allergies: Negative.     Psychiatric/Behavioral: Negative.   All other systems reviewed and are negative. 14 point ROS was done and is otherwise as detailed above or in HPI   PHYSICAL EXAMINATION: ECOG PERFORMANCE STATUS: 0 - Asymptomatic  Vitals:   04/03/16 0938  BP: 129/70  Pulse: 81  Resp: 16  Temp: 98.2 F (36.8 C)   Filed Weights   04/03/16 0938  Weight: 162 lb 6.4 oz (73.7 kg)    Physical Exam  Constitutional: She is oriented to person, place, and time and well-developed, well-nourished, and in no distress.  Pt was able to get on exam table without assistance.   HENT:  Head: Normocephalic and atraumatic.  Nose: Nose normal.  Mouth/Throat: Oropharynx is clear and moist. No oropharyngeal exudate.  Eyes: Conjunctivae and EOM are normal. Pupils are equal, round, and reactive to light. Right eye exhibits no discharge. Left eye exhibits no discharge. No scleral icterus.  Neck: Normal range of motion. Neck supple. No tracheal deviation present. No thyromegaly present.  Cardiovascular: Normal rate, regular rhythm and normal heart sounds.  Exam reveals no gallop and no friction rub.   No murmur heard. Pulmonary/Chest: Effort normal and breath sounds normal. She has no wheezes. She has no rales.  Abdominal: Soft. Bowel sounds are normal. She exhibits no distension and no mass. There is no tenderness. There is no rebound and no guarding.  Musculoskeletal: Normal range of motion. She exhibits no edema.  Lymphadenopathy:    She has no cervical adenopathy.  Neurological: She is alert and oriented to person, place, and time. She has normal reflexes. No cranial nerve deficit. Gait normal. Coordination normal.  Skin: Skin is warm and dry. There is erythema (face and neck).  Psychiatric: Mood, memory, affect and judgment normal.  Nursing note and vitals reviewed.   LABORATORY DATA:  I have reviewed the data as listed Lab Results  Component Value Date   WBC 11.5 (H) 04/03/2016   HGB 12.4 04/03/2016    HCT 37.8 04/03/2016   MCV 99.7 04/03/2016   PLT 245 04/03/2016   CMP     Component Value Date/Time   NA 135 04/03/2016 0932   K 3.8 04/03/2016 0932   CL 104 04/03/2016 0932   CO2 22 04/03/2016 0932   GLUCOSE 114 (H) 04/03/2016 0932   BUN 12 04/03/2016 0932   CREATININE 0.74 04/03/2016 0932   CALCIUM 9.4 04/03/2016 0932   PROT 7.2 04/03/2016 0932   ALBUMIN 4.0 04/03/2016 0932   AST 25 04/03/2016 0932   ALT 25 04/03/2016 0932   ALKPHOS 61 04/03/2016 0932   BILITOT 0.7 04/03/2016 0932   GFRNONAA >60 04/03/2016 0932   GFRAA >60 04/03/2016 0932     RADIOGRAPHIC STUDIES: I have personally reviewed the radiological images as listed and agreed with the findings in the report. Study Result   CLINICAL DATA:  60 year old who simultaneously underwent left mastectomy for breast cancer and had a Port-A-Cath placed intraoperatively.  EXAM: PORTABLE CHEST 1 VIEW  COMPARISON:  01/12/2015, 12/31/2014 and earlier.  FINDINGS: Right jugular Port-A-Cath tip projects at or near the expected location of the  cavoatrial junction. No evidence of pneumothorax or mediastinal hematoma. Postsurgical changes related to left mastectomy with skin staples overlying the left hemithorax. Suboptimal inspiration accounts for crowded bronchovascular markings, especially in the bases, and accentuates the cardiac silhouette. Taking this into account, cardiomediastinal silhouette unremarkable and lungs clear.  IMPRESSION: 1. Right jugular Port-A-Cath tip at or near the expected location of the cavoatrial junction. No acute complicating features. 2. Suboptimal inspiration.  No acute cardiopulmonary disease.   Electronically Signed   By: Evangeline Dakin M.D.   On: 01/07/2016 10:26    PATHOLOGY:     ASSESSMENT & PLAN:  L breast cancer upper outer quadrant ER- PR- HER 2 + Prior history L breast cancer ER+, lumpectomy, XRT, Tamoxifen [facility]  Anxiety about health GERD dysuria  I will  write her a prescription for a topical steroid to use on her chest area. Her facial erythema is worse around and during chemotherapy which may be steroid related as well.   If the swelling in her arm gets worse, I will set her up for lymphedema therapy. She currently wishes to wait.  I will write her a refill for alprazolam. She is to continue on her reflux medications. If symptoms persist after she completes therapy she will need a referral to GI.   She will return for her next treatment on 04/24/16. Proceed with chemotherapy today.  Labs were reviewed with the patient. Results are noted above.  For her dysuria, will order a U/A.   Orders Placed This Encounter  Procedures  . Urine culture    Standing Status:   Future    Number of Occurrences:   1    Standing Expiration Date:   04/03/2017  . Urinalysis, Routine w reflex microscopic    Standing Status:   Future    Number of Occurrences:   1    Standing Expiration Date:   04/03/2017  . CBC with Differential    Standing Status:   Future    Standing Expiration Date:   04/03/2017  . Comprehensive metabolic panel    Standing Status:   Future    Standing Expiration Date:   04/03/2017   Meds ordered this encounter  Medications  . ALPRAZolam (XANAX) 0.5 MG tablet    Sig: Take 1 tablet (0.5 mg total) by mouth 3 (three) times daily as needed for anxiety.    Dispense:  60 tablet    Refill:  1  . triamcinolone ointment (KENALOG) 0.5 %    Sig: Apply 1 application topically 2 (two) times daily.    Dispense:  30 g    Refill:  4     All questions were answered. The patient knows to call the clinic with any problems, questions or concerns.  This document serves as a record of services personally performed by Ancil Linsey, MD. It was created on her behalf by Martinique Casey, a trained medical scribe. The creation of this record is based on the scribe's personal observations and the provider's statements to them. This document has been checked and  approved by the attending provider.  I have reviewed the above documentation for accuracy and completeness and I agree with the above.  This note was electronically signed.    Molli Hazard, MD  04/03/2016 10:18 AM

## 2016-04-04 LAB — T3, FREE: T3, Free: 2.1 pg/mL (ref 2.0–4.4)

## 2016-04-05 LAB — URINE CULTURE: Culture: <10000 — AB

## 2016-04-13 ENCOUNTER — Telehealth (HOSPITAL_COMMUNITY): Payer: Self-pay | Admitting: Emergency Medicine

## 2016-04-13 NOTE — Telephone Encounter (Signed)
Pt called and stated that she has had a rapid heart rate 115-130 this weekend.  I told her that this is not normal.  She states that she is on medication that helps her heart rate.  I advised her that she needed to go to the ER to have this checked out.  We made her appt to see Dr Whitney Muse on 04/15/2016 at 11:10 am.  She verbalized understanding.

## 2016-04-15 ENCOUNTER — Ambulatory Visit (HOSPITAL_COMMUNITY): Payer: BLUE CROSS/BLUE SHIELD | Admitting: Hematology & Oncology

## 2016-04-17 DIAGNOSIS — E042 Nontoxic multinodular goiter: Secondary | ICD-10-CM | POA: Diagnosis not present

## 2016-04-22 ENCOUNTER — Encounter (HOSPITAL_COMMUNITY): Payer: Self-pay

## 2016-04-22 ENCOUNTER — Encounter (HOSPITAL_BASED_OUTPATIENT_CLINIC_OR_DEPARTMENT_OTHER): Payer: BLUE CROSS/BLUE SHIELD

## 2016-04-22 ENCOUNTER — Telehealth (HOSPITAL_COMMUNITY): Payer: Self-pay

## 2016-04-22 ENCOUNTER — Other Ambulatory Visit (HOSPITAL_COMMUNITY): Payer: BLUE CROSS/BLUE SHIELD

## 2016-04-22 ENCOUNTER — Other Ambulatory Visit (HOSPITAL_COMMUNITY): Payer: Self-pay | Admitting: Oncology

## 2016-04-22 ENCOUNTER — Other Ambulatory Visit (HOSPITAL_COMMUNITY): Payer: Self-pay

## 2016-04-22 DIAGNOSIS — C50412 Malignant neoplasm of upper-outer quadrant of left female breast: Secondary | ICD-10-CM

## 2016-04-22 DIAGNOSIS — Z452 Encounter for adjustment and management of vascular access device: Secondary | ICD-10-CM

## 2016-04-22 DIAGNOSIS — Z171 Estrogen receptor negative status [ER-]: Principal | ICD-10-CM

## 2016-04-22 LAB — COMPREHENSIVE METABOLIC PANEL
ALT: 22 U/L (ref 14–54)
ANION GAP: 7 (ref 5–15)
AST: 27 U/L (ref 15–41)
Albumin: 3.8 g/dL (ref 3.5–5.0)
Alkaline Phosphatase: 60 U/L (ref 38–126)
BUN: 6 mg/dL (ref 6–20)
CO2: 24 mmol/L (ref 22–32)
Calcium: 9.1 mg/dL (ref 8.9–10.3)
Chloride: 106 mmol/L (ref 101–111)
Creatinine, Ser: 0.52 mg/dL (ref 0.44–1.00)
Glucose, Bld: 82 mg/dL (ref 65–99)
POTASSIUM: 3.8 mmol/L (ref 3.5–5.1)
Sodium: 137 mmol/L (ref 135–145)
TOTAL PROTEIN: 6.8 g/dL (ref 6.5–8.1)
Total Bilirubin: 0.5 mg/dL (ref 0.3–1.2)

## 2016-04-22 LAB — CBC WITH DIFFERENTIAL/PLATELET
Basophils Absolute: 0 10*3/uL (ref 0.0–0.1)
Basophils Relative: 1 %
EOS PCT: 0 %
Eosinophils Absolute: 0 10*3/uL (ref 0.0–0.7)
HEMATOCRIT: 37.1 % (ref 36.0–46.0)
Hemoglobin: 12 g/dL (ref 12.0–15.0)
LYMPHS ABS: 1.9 10*3/uL (ref 0.7–4.0)
LYMPHS PCT: 35 %
MCH: 33.2 pg (ref 26.0–34.0)
MCHC: 32.3 g/dL (ref 30.0–36.0)
MCV: 102.8 fL — AB (ref 78.0–100.0)
MONO ABS: 0.6 10*3/uL (ref 0.1–1.0)
MONOS PCT: 10 %
NEUTROS ABS: 2.9 10*3/uL (ref 1.7–7.7)
Neutrophils Relative %: 54 %
PLATELETS: 195 10*3/uL (ref 150–400)
RBC: 3.61 MIL/uL — ABNORMAL LOW (ref 3.87–5.11)
RDW: 16.4 % — AB (ref 11.5–15.5)
WBC: 5.3 10*3/uL (ref 4.0–10.5)

## 2016-04-22 MED ORDER — SODIUM CHLORIDE 0.9% FLUSH
10.0000 mL | INTRAVENOUS | Status: DC | PRN
  Administered 2016-04-22: 10 mL via INTRAVENOUS
  Filled 2016-04-22: qty 10

## 2016-04-22 MED ORDER — HEPARIN SOD (PORK) LOCK FLUSH 100 UNIT/ML IV SOLN
500.0000 [IU] | Freq: Once | INTRAVENOUS | Status: AC
  Administered 2016-04-22: 500 [IU] via INTRAVENOUS

## 2016-04-22 NOTE — Telephone Encounter (Signed)
Patient came by cancer center because she states she was told she would be able to see Dr. Whitney Muse today. Patient had an 11:10 lab appointment but not a provider appt.    Patient said she had questions so I took her to a room to find out what was needed. She is complaining of her eyes and nose watering constantly since starting chemotherapy. Her acid reflux is getting worse and her medications are not helping. Finally she said her heart rate was elevated the first week after chemo.    She says she stopped taking the Pepcid a month ago because she didn't think it was helping. She sleeps with her head elevated and is trying home and OTC remedies such as soda water and pepto-bismal without relief for the acid reflux.   She states her heart rate was elevated and she called and spoke to Anderson Malta, Therapist, sports, nurse navigator who instructed patient to go to ER. Patient states she did not go because she "knew it wasn't nothing bad and didn't want to have a bunch of tests."   After reviewing patients complaints with PA, I explained to patient that the chemo is causing her eyes and nose to water and there is nothing that can be done to help that problem. She can try to start taking the Pepcid again or try Zantac. As for her heart rate, I explained that dehydration can cause rapid heart rate but she denies dehydration and diarrhea.   As each complaint was addressed and with every suggested resolution or remedy of said complaint she would either add more symptoms to the original complaint, refuse the suggestion as an option, or had negative feedback.   Her vitals were stable and she has an appointment on Friday with the PA prior to her chemotherapy. She had labs drawn from port for dentist appointment tomorrow. She verbalized understanding to keep Friday's appointment and to call if any further issues.

## 2016-04-22 NOTE — Progress Notes (Signed)
[name] presented for Portacath access and flush.   Portacath located right chest wall accessed with H 20 needle.  Good blood return present. Portacath flushed with 70ml NS and 500U/68ml Heparin and needle removed intact.  Procedure tolerated well and without incident.   Patient discharged ambulatory from clinic, follow up instructions given.

## 2016-04-22 NOTE — Patient Instructions (Signed)
[place] at [facility] Discharge Instructions  RECOMMENDATIONS MADE BY THE CONSULTANT AND ANY TEST RESULTS WILL BE SENT TO YOUR REFERRING PHYSICIAN.  Port flush today Follow Friday as scheduled  Thank you for choosing [place] at [facility] to provide your oncology and hematology care.  To afford each patient quality time with our provider, please arrive at least 15 minutes before your scheduled appointment time.   Beginning January 23rd 2017 lab work for the [facility] will be done in the  Main lab at [facility] on 1st floor. If you have a lab appointment with the [facility] please come in thru the  Main Entrance and check in at the main information desk  You need to re-schedule your appointment should you arrive 10 or more minutes late.  We strive to give you quality time with our providers, and arriving late affects you and other patients whose appointments are after yours.  Also, if you no show three or more times for appointments you may be dismissed from the clinic at the providers discretion.     Again, thank you for choosing [facility].  Our hope is that these requests will decrease the amount of time that you wait before being seen by our physicians.       _____________________________________________________________  Should you have questions after your visit to [facility], please contact our office at [phone] between the hours of 8:30 a.m. and 4:30 p.m.  Voicemails left after 4:30 p.m. will not be returned until the following business day.  For prescription refill requests, have your pharmacy contact our office.         Resources For Cancer Patients and their Caregivers ? American Cancer Society: Can assist with transportation, wigs, general needs, runs Look Good Feel Better.        [phone] ? Cancer Care: Provides financial assistance, online support groups, medication/co-pay  assistance.  1-800-813-HOPE (863) 566-3660) ? [place] Assists [place] cancer patients and their families through emotional , educational and financial support.  613-277-8294 ? [place] DSS Where to apply for food stamps, Medicaid and utility assistance. 760-380-0818 ? RCATS: Transportation to medical appointments. (346)147-9446 ? Social Security Administration: May apply for disability if have a Stage IV cancer. 306-057-4492 [phone]) [phone] ? [employer], Disability and Transit Services: Assists with nutrition, care and transit needs. [place] Support Programs: @10RELATIVEDAYS @ > Cancer Support Group  2nd Tuesday of the month 1pm-2pm, Journey Room  > Creative Journey  3rd Tuesday of the month 1130am-1pm, Journey Room  > Look Good Feel Better  1st Wednesday of the month 10am-12 noon, Journey Room (Call [place] to register [phone])

## 2016-04-23 ENCOUNTER — Encounter (HOSPITAL_COMMUNITY): Payer: Self-pay | Admitting: Hematology & Oncology

## 2016-04-23 ENCOUNTER — Encounter (HOSPITAL_COMMUNITY): Payer: Self-pay

## 2016-04-24 ENCOUNTER — Encounter (HOSPITAL_COMMUNITY): Payer: Self-pay | Admitting: Oncology

## 2016-04-24 ENCOUNTER — Encounter (HOSPITAL_BASED_OUTPATIENT_CLINIC_OR_DEPARTMENT_OTHER): Payer: BLUE CROSS/BLUE SHIELD | Admitting: Oncology

## 2016-04-24 ENCOUNTER — Encounter (HOSPITAL_BASED_OUTPATIENT_CLINIC_OR_DEPARTMENT_OTHER): Payer: BLUE CROSS/BLUE SHIELD

## 2016-04-24 VITALS — BP 124/72 | HR 84 | Temp 98.2°F | Resp 18

## 2016-04-24 DIAGNOSIS — K0381 Cracked tooth: Secondary | ICD-10-CM

## 2016-04-24 DIAGNOSIS — H579 Unspecified disorder of eye and adnexa: Secondary | ICD-10-CM

## 2016-04-24 DIAGNOSIS — C50412 Malignant neoplasm of upper-outer quadrant of left female breast: Secondary | ICD-10-CM

## 2016-04-24 DIAGNOSIS — K219 Gastro-esophageal reflux disease without esophagitis: Secondary | ICD-10-CM

## 2016-04-24 DIAGNOSIS — M79606 Pain in leg, unspecified: Secondary | ICD-10-CM

## 2016-04-24 DIAGNOSIS — Z171 Estrogen receptor negative status [ER-]: Principal | ICD-10-CM

## 2016-04-24 DIAGNOSIS — C50919 Malignant neoplasm of unspecified site of unspecified female breast: Secondary | ICD-10-CM | POA: Diagnosis not present

## 2016-04-24 DIAGNOSIS — Z5112 Encounter for antineoplastic immunotherapy: Secondary | ICD-10-CM

## 2016-04-24 DIAGNOSIS — Z5111 Encounter for antineoplastic chemotherapy: Secondary | ICD-10-CM

## 2016-04-24 MED ORDER — FAMOTIDINE IN NACL 20-0.9 MG/50ML-% IV SOLN
20.0000 mg | Freq: Two times a day (BID) | INTRAVENOUS | Status: DC
  Administered 2016-04-24: 20 mg via INTRAVENOUS
  Filled 2016-04-24: qty 50

## 2016-04-24 MED ORDER — HEPARIN SOD (PORK) LOCK FLUSH 100 UNIT/ML IV SOLN
500.0000 [IU] | Freq: Once | INTRAVENOUS | Status: AC | PRN
  Administered 2016-04-24: 500 [IU]
  Filled 2016-04-24: qty 5

## 2016-04-24 MED ORDER — DOCETAXEL CHEMO INJECTION 160 MG/16ML
75.0000 mg/m2 | Freq: Once | INTRAVENOUS | Status: AC
  Administered 2016-04-24: 140 mg via INTRAVENOUS
  Filled 2016-04-24: qty 14

## 2016-04-24 MED ORDER — TRASTUZUMAB CHEMO 150 MG IV SOLR
6.0000 mg/kg | Freq: Once | INTRAVENOUS | Status: AC
  Administered 2016-04-24: 441 mg via INTRAVENOUS
  Filled 2016-04-24: qty 21

## 2016-04-24 MED ORDER — DIPHENHYDRAMINE HCL 25 MG PO CAPS
50.0000 mg | ORAL_CAPSULE | Freq: Once | ORAL | Status: AC
  Administered 2016-04-24: 50 mg via ORAL
  Filled 2016-04-24: qty 2

## 2016-04-24 MED ORDER — ACETAMINOPHEN 325 MG PO TABS
ORAL_TABLET | ORAL | Status: AC
  Filled 2016-04-24: qty 2

## 2016-04-24 MED ORDER — PEGFILGRASTIM 6 MG/0.6ML ~~LOC~~ PSKT
6.0000 mg | PREFILLED_SYRINGE | Freq: Once | SUBCUTANEOUS | Status: AC
  Administered 2016-04-24: 6 mg via SUBCUTANEOUS

## 2016-04-24 MED ORDER — PALONOSETRON HCL INJECTION 0.25 MG/5ML
0.2500 mg | Freq: Once | INTRAVENOUS | Status: AC
  Administered 2016-04-24: 0.25 mg via INTRAVENOUS
  Filled 2016-04-24: qty 5

## 2016-04-24 MED ORDER — PEGFILGRASTIM 6 MG/0.6ML ~~LOC~~ PSKT
PREFILLED_SYRINGE | SUBCUTANEOUS | Status: AC
  Filled 2016-04-24: qty 0.6

## 2016-04-24 MED ORDER — DEXAMETHASONE SODIUM PHOSPHATE 10 MG/ML IJ SOLN
10.0000 mg | Freq: Once | INTRAMUSCULAR | Status: AC
  Administered 2016-04-24: 10 mg via INTRAVENOUS
  Filled 2016-04-24: qty 1

## 2016-04-24 MED ORDER — SODIUM CHLORIDE 0.9% FLUSH: 10.0000 mL | INTRAVENOUS | Status: DC | PRN

## 2016-04-24 MED ORDER — HEPARIN SOD (PORK) LOCK FLUSH 100 UNIT/ML IV SOLN: 500.0000 [IU] | Freq: Once | INTRAVENOUS | Status: DC | PRN

## 2016-04-24 MED ORDER — CARBOPLATIN CHEMO INJECTION 600 MG/60ML
558.5000 mg | Freq: Once | INTRAVENOUS | Status: AC
  Administered 2016-04-24: 560 mg via INTRAVENOUS
  Filled 2016-04-24: qty 56

## 2016-04-24 MED ORDER — SODIUM CHLORIDE 0.9 % IV SOLN: 10.0000 mg | Freq: Once | INTRAVENOUS | Status: DC

## 2016-04-24 MED ORDER — SODIUM CHLORIDE 0.9 % IV SOLN: Freq: Once | INTRAVENOUS | Status: DC

## 2016-04-24 MED ORDER — SODIUM CHLORIDE 0.9 % IV SOLN
Freq: Once | INTRAVENOUS | Status: AC
  Administered 2016-04-24: 10:00:00 via INTRAVENOUS

## 2016-04-24 MED ORDER — SODIUM CHLORIDE 0.9% FLUSH
10.0000 mL | INTRAVENOUS | Status: DC | PRN
  Administered 2016-04-24: 10 mL
  Filled 2016-04-24: qty 10

## 2016-04-24 MED ORDER — ACETAMINOPHEN 325 MG PO TABS
650.0000 mg | ORAL_TABLET | Freq: Once | ORAL | Status: AC
  Administered 2016-04-24: 650 mg via ORAL

## 2016-04-24 NOTE — Patient Instructions (Signed)
[facility] Discharge Instructions for Patients Receiving Chemotherapy   Beginning January 23rd 2017 lab work for the [facility] will be done in the  Main lab at [facility] on 1st floor. If you have a lab appointment with the [facility] please come in thru the  Main Entrance and check in at the main information desk   Today you received the following chemotherapy agents Taxotere,Carboplatin and Herceptin as well as Neulasta on-pro. Follow-up as scheduled. Call clinic for any questions or concerns  To help prevent nausea and vomiting after your treatment, we encourage you to take your nausea medication   If you develop nausea and vomiting, or diarrhea that is not controlled by your medication, call the clinic.  The clinic phone number is [phone]. Office hours are Monday-Friday 8:30am-5:00pm.  BELOW ARE SYMPTOMS THAT SHOULD BE REPORTED IMMEDIATELY:  *FEVER GREATER THAN 101.0 F  *CHILLS WITH OR WITHOUT FEVER  NAUSEA AND VOMITING THAT IS NOT CONTROLLED WITH YOUR NAUSEA MEDICATION  *UNUSUAL SHORTNESS OF BREATH  *UNUSUAL BRUISING OR BLEEDING  TENDERNESS IN MOUTH AND THROAT WITH OR WITHOUT PRESENCE OF ULCERS  *URINARY PROBLEMS  *BOWEL PROBLEMS  UNUSUAL RASH Items with * indicate a potential emergency and should be followed up as soon as possible. If you have an emergency after office hours please contact your primary care physician or go to the nearest emergency department.  Please call the clinic during office hours if you have any questions or concerns.   You may also contact the Patient Navigator at [phone] should you have any questions or need assistance in obtaining follow up care.      Resources For Cancer Patients and their Caregivers ? American Cancer Society: Can assist with transportation, wigs, general needs, runs Look Good Feel Better.        ([phone] [phone] ? Cancer Care: Provides financial assistance, online support groups,  medication/co-pay assistance.  1-800-813-HOPE 269 424 3149) ? [place] Assists [place] cancer patients and their families through emotional , educational and financial support.  ([phone] ? [place] DSS Where to apply for food stamps, Medicaid and utility assistance. 620-346-2928 ? RCATS: Transportation to medical appointments. 514-265-4146 ? Social Security Administration: May apply for disability if have a Stage IV cancer. 720-035-7951 [phone] ? [employer], Disability and Transit Services: Assists with nutrition, care and transit needs. [phone]

## 2016-04-24 NOTE — Progress Notes (Signed)
Paulla Herzberg Oquin tolerated chemo tx and Neulasta on-pro well without complaints or incident. Labs reviewed from 12/20. Neulasta on-pro applied to pt's right arm with green indicator light flashing. Reviewed with pt when to remove Neulasta and pt verbalized understanding. VSS upon discharge. Pt discharged self ambulatory in satisfactory condition accompanied by her husband

## 2016-04-24 NOTE — Progress Notes (Signed)
[name], [place] [address]  Malignant neoplasm of upper-outer quadrant of left breast in female, estrogen receptor negative ([place]) - Plan: DISCONTINUED: famotidine (PEPCID) IVPB 20 mg premix  CURRENT THERAPY: Carboplatin/Docetaxel/Herceptin beginning on 01/31/2016.  INTERVAL HISTORY: [name] 60 y.o. female returns for followup of Stage IA (T1CN0M0) left invasive ductal carcinoma of upper outer quadrant, ER/PR negative, HER2 POSITIVE.  S/P simple left mastectomy by Dr. Georgette Dover on 01/07/2016.  Currently on [facility] systemic chemotherapy.    Breast cancer of upper-outer quadrant of left female breast ([place])   11/07/1999 Surgery    Lumpectomy/sentinel LN biopsy invasive ductal carcinoma and DCIS at [facility], 0/6 sentinel LN      11/10/1999 - 01/08/2005 Anti-estrogen oral therapy    Tamoxifen 20 mg daily      11/24/1999 - 01/02/2000 Radiation Therapy         12/18/2015 Mammogram    2D digital screening bilateral mammogram with CAD and adjunct TOMO, in the L breast a possible mass warrants further evaluation. In R breast no findings suspicious for malignancy      12/24/2015 Pathology Results    Invasive ductal carcinoma Grade 3 Left core needle biopsy 1:30 o clock ER- PR- Ki67 70%, HER 2 positive      12/24/2015 Imaging    L breast Ultrasound, notes palpable area of concern demonstrates a hypoechoic irregular mass at 1:30, 7 cm from the nipple measuring 1.3 x 0.9 x 1.4 cm. No evidence of L axillary LAD      01/07/2016 Procedure    Left simple mastectomy by Dr. Georgette Dover      01/08/2016 Pathology Results    Breast, simple mastectomy, Left - INVASIVE DUCTAL CARCINOMA, GRADE III/III, SPANNING 1.4 CM. - THE SURGICAL RESECTION MARGINS ARE NEGATIVE FOR CARCINOMA.      01/23/2016 Genetic Testing    Patient refused genetic testing at time of consultation with Roma Kayser.  "Despite our recommendation, Ms. Osso did not wish to pursue genetic  testing at today's visit."      01/28/2016 Echocardiogram    Left ventricle: The cavity size was normal. Wall thickness was normal. Systolic function was normal. The estimated ejection fraction was in the range of 55% to 60%. Wall motion was normal; there were no regional wall motion abnormalities. Doppler parameters are consistent with abnormal left ventricular relaxation (grade 1 diastolic dysfunction).      01/31/2016 -  Chemotherapy    The patient had palonosetron (ALOXI) injection 0.25 mg, 0.25 mg, Intravenous,  Once, 1 of 6 cycles  pegfilgrastim (NEULASTA ONPRO KIT) injection 6 mg, 6 mg, Subcutaneous, Once, 1 of 6 cycles  CARBOplatin (PARAPLATIN) 560 mg in sodium chloride 0.9 % 250 mL chemo infusion, 560 mg (100 % of original dose 558.5 mg), Intravenous,  Once, 1 of 6 cycles Dose modification:   (original dose 558.5 mg, Cycle 1),   (original dose 558.5 mg, Cycle 2)  DOCEtaxel (TAXOTERE) 140 mg in dextrose 5 % 250 mL chemo infusion, 75 mg/m2 = 140 mg, Intravenous,  Once, 1 of 6 cycles  for chemotherapy treatment.        01/31/2016 -  Antibody Plan    Herceptin x 52 weeks      As usual, she has a number of complaints: 1. Eyes watering- this is likley secondary to Taxotere.  She has no redness of her eyes and she denies any pruritis associated with this symptom. 2. Legs hurting- this  is mild. 3. Acid reflux- She is on Prilosec and she has tried adjunct treatment as well with Pepcid/Zantac. 4. She reported to the clinic on Wednesday with a number of complaints as well  A. Urinary discomfort- UA with urine culture performed and was negative for UTI.   She reports a 3-4 day history of tachycardia with patient reported HR in the 120's.  She was advised to report to the ED, but she did not.  She brought this issue up in the clinic again today and she is advised that if she does not follow our recommendations, then we cannot help her.  Her HR today in the clinic is 86.  I do  not have documentation of tachycardia.  She noted no symptoms when she experienced this issue.   She saw her dentist recently for a broken tooth.  He recommended extraction and has referred the patient to an oral surgeon.  She does not have an appointment yet.  I have recommended that extraction take place at the beginning of Feb 2018, following the completion of systemic chemotherapy, unless there is a more urgent reason to have this extracted, at which time, we will need to coordinate with oral surgeon.  Review of Systems  Constitutional: Negative.  Negative for chills, fever and weight loss.  HENT: Negative.   Eyes: Positive for discharge. Negative for pain and redness.  Respiratory: Negative.  Negative for cough and sputum production.   Cardiovascular: Positive for palpitations. Negative for chest pain and leg swelling.  Gastrointestinal: Negative.  Negative for constipation, diarrhea, nausea and vomiting.  Musculoskeletal: Negative.   Skin: Negative.  Negative for rash.  Neurological: Negative.  Negative for weakness.  Endo/Heme/Allergies: Negative.   Psychiatric/Behavioral: Negative.     Past Medical History:  Diagnosis Date  . Anxiety   . Breast cancer ([place])   . Breast cancer of upper-outer quadrant of left female breast ([place]) 01/07/2016  . Complication of anesthesia   . DDD (degenerative disc disease), cervical   . DDD (degenerative disc disease), lumbar   . Depression   . Dysrhythmia    last cardiology ov 06-13-2013 in [facility], takes metoprolol for HR  . Family history of breast cancer   . Family history of colon cancer   . Fibromyalgia   . GERD (gastroesophageal reflux disease)   . Hematuria   . History of echocardiogram 2015   mild mital insufficiency, otherwise normal  . Leaky heart valve   . Multiple thyroid nodules   . PAC (premature atrial contraction)   . Palpitations   . PONV (postoperative nausea and vomiting)   . SOB (shortness of breath)   . Tachycardia      Past Surgical History:  Procedure Laterality Date  . BREAST LUMPECTOMY    . LAPAROSCOPIC APPENDECTOMY N/A 11/10/2012   Procedure: APPENDECTOMY LAPAROSCOPIC;  Surgeon: Jamesetta So, MD;  Location: [facility];  Service: General;  Laterality: N/A;  . MASTECTOMY    . MASTECTOMY W/ SENTINEL NODE BIOPSY Left 01/07/2016  . MASTECTOMY W/ SENTINEL NODE BIOPSY Left 01/07/2016   Procedure: LEFT TOTAL MASTECTOMY WITH LEFT AXILLARY SENTINEL LYMPH NODE BIOPSY;  Surgeon: Donnie Mesa, MD;  Location: [facility];  Service: General;  Laterality: Left;  . PORTACATH PLACEMENT Right 01/07/2016   IJ  . PORTACATH PLACEMENT Right 01/07/2016   Procedure: INSERTION PORT-A-CATH RIGHT INTERNAL JUGULAR WITH ULTRASOUND;  Surgeon: Donnie Mesa, MD;  Location: [facility];  Service: General;  Laterality: Right;    Family History  Problem Relation  Age of Onset  . Cancer Mother     colon  . Diabetes Father   . Diabetes Sister   . Alcohol abuse Brother   . Cancer Brother 92    lung cancer  . Cancer Brother     penile  . Breast cancer Maternal Aunt   . Cancer Other     unknown form of cancer    Social History   Social History  . Marital status: Married    Spouse name: N/A  . Number of children: 2  . Years of education: N/A   Social History Main Topics  . Smoking status: Never Smoker  . Smokeless tobacco: Never Used  . Alcohol use No  . Drug use: No  . Sexual activity: Yes     Comment: married   Other Topics Concern  . None   Social History Narrative  . None     PHYSICAL EXAMINATION  ECOG PERFORMANCE STATUS: 1 - Symptomatic but completely ambulatory  Vitals:   04/24/16 0907  BP: 123/70  Pulse: 86  Resp: 16  Temp: 98.1 F (36.7 C)    GENERAL:alert, well nourished, well developed, comfortable, cooperative, smiling and accompanied by husband SKIN: skin color, texture, turgor are normal, no rashes or significant lesions HEAD: Normocephalic, No masses, lesions, tenderness or abnormalities EYES:  normal, EOMI, Conjunctiva are pink and non-injected, clear watery discharge noted. EARS: External ears normal OROPHARYNX:lips, buccal mucosa, and tongue normal and mucous membranes are moist  NECK: supple, trachea midline LYMPH:  not examined BREAST:not examined LUNGS: clear to auscultation  HEART: regular rate & rhythm, no murmurs and no gallops ABDOMEN:abdomen soft and normal bowel sounds BACK: Back symmetric, no curvature. EXTREMITIES:less then 2 second capillary refill, no joint deformities, effusion, or inflammation, no skin discoloration  NEURO: alert & oriented x 3 with fluent speech, no focal motor/sensory deficits, gait normal   LABORATORY DATA: CBC    Component Value Date/Time   WBC 5.3 04/22/2016 1150   RBC 3.61 (L) 04/22/2016 1150   HGB 12.0 04/22/2016 1150   HCT 37.1 04/22/2016 1150   PLT 195 04/22/2016 1150   MCV 102.8 (H) 04/22/2016 1150   MCH 33.2 04/22/2016 1150   MCHC 32.3 04/22/2016 1150   RDW 16.4 (H) 04/22/2016 1150   LYMPHSABS 1.9 04/22/2016 1150   MONOABS 0.6 04/22/2016 1150   EOSABS 0.0 04/22/2016 1150   BASOSABS 0.0 04/22/2016 1150      Chemistry      Component Value Date/Time   NA 137 04/22/2016 1150   K 3.8 04/22/2016 1150   CL 106 04/22/2016 1150   CO2 24 04/22/2016 1150   BUN 6 04/22/2016 1150   CREATININE 0.52 04/22/2016 1150      Component Value Date/Time   CALCIUM 9.1 04/22/2016 1150   ALKPHOS 60 04/22/2016 1150   AST 27 04/22/2016 1150   ALT 22 04/22/2016 1150   BILITOT 0.5 04/22/2016 1150        PENDING LABS:   RADIOGRAPHIC STUDIES:  No results found.   PATHOLOGY:    ASSESSMENT AND PLAN:  Breast cancer of upper-outer quadrant of left female breast ([place]) Stage IA (T1CN0M0) left invasive ductal carcinoma of upper outer quadrant, ER/PR negative, HER2 POSITIVE.  S/P simple left mastectomy by Dr. Georgette Dover on 01/07/2016.  Currently on [facility] systemic chemotherapy.  Oncology history updated.  Pre-treatment labs performed on  04/22/2016: CBC diff, CMET.  I personally reviewed and went over laboratory results with the patient.  The results are noted  within this dictation.  Treatment parameters are satisfied.  She was seen for a nursing assessment on 04/22/2016.  She had a number of complaints, but with every suggested resolution, she had a negative response and refused to follow recommendations.  UA and urine culture are negative for UTI.  I have added Pepcid IV today to her pre-meds to see if that helps with her GERD.  Return in 3 weeks for follow-up and cycle #6 of treatment.   ORDERS PLACED FOR THIS ENCOUNTER: No orders of the defined types were placed in this encounter.   MEDICATIONS PRESCRIBED THIS ENCOUNTER: No orders of the defined types were placed in this encounter.   THERAPY PLAN:  Complete 6 cycle of TC and continue with Herceptin x 52 weeks.  All questions were answered. The patient knows to call the clinic with any problems, questions or concerns. We can certainly see the patient much sooner if necessary.  Patient and plan discussed with Dr. Ancil Linsey and she is in agreement with the aforementioned.   This note is electronically signed by: Doy Mince 04/24/2016 9:44 AM

## 2016-04-24 NOTE — Assessment & Plan Note (Addendum)
Stage IA (T1CN0M0) left invasive ductal carcinoma of upper outer quadrant, ER/PR negative, HER2 POSITIVE.  S/P simple left mastectomy by Dr. Tsuei on 01/07/2016.  Currently on [facility] systemic chemotherapy.  Oncology history updated.  Pre-treatment labs performed on 04/22/2016: CBC diff, CMET.  I personally reviewed and went over laboratory results with the patient.  The results are noted within this dictation.  Treatment parameters are satisfied.  She was seen for a nursing assessment on 04/22/2016.  She had a number of complaints, but with every suggested resolution, she had a negative response and refused to follow recommendations.  UA and urine culture are negative for UTI.  I have added Pepcid IV today to her pre-meds to see if that helps with her GERD.  Return in 3 weeks for follow-up and cycle #6 of treatment. 

## 2016-04-24 NOTE — Patient Instructions (Signed)
[place] at [facility] Discharge Instructions  RECOMMENDATIONS MADE BY THE CONSULTANT AND ANY TEST RESULTS WILL BE SENT TO YOUR REFERRING PHYSICIAN.  You saw Katherine Crigler, PA-C, today. See Amy at checkout for appointments.  Thank you for choosing [place] at [facility] to provide your oncology and hematology care.  To afford each patient quality time with our provider, please arrive at least 15 minutes before your scheduled appointment time.   Beginning January 23rd 2017 lab work for the [facility] will be done in the  Main lab at [facility] on 1st floor. If you have a lab appointment with the [facility] please come in thru the  Main Entrance and check in at the main information desk  You need to re-schedule your appointment should you arrive 10 or more minutes late.  We strive to give you quality time with our providers, and arriving late affects you and other patients whose appointments are after yours.  Also, if you no show three or more times for appointments you may be dismissed from the clinic at the providers discretion.     Again, thank you for choosing [facility].  Our hope is that these requests will decrease the amount of time that you wait before being seen by our physicians.       _____________________________________________________________  Should you have questions after your visit to [facility], please contact our office at [phone] between the hours of 8:30 a.m. and 4:30 p.m.  Voicemails left after 4:30 p.m. will not be returned until the following business day.  For prescription refill requests, have your pharmacy contact our office.         Resources For Cancer Patients and their Caregivers ? American Cancer Society: Can assist with transportation, wigs, general needs, runs Look Good Feel Better.        9207546631 ? Cancer Care: Provides financial assistance, online  support groups, medication/co-pay assistance.  1-800-813-HOPE (878)022-1512) ? [place] Assists [place] cancer patients and their families through emotional , educational and financial support.  320-370-3858 ? [place] DSS Where to apply for food stamps, Medicaid and utility assistance. ([phone] ? [employer]: Transportation to medical appointments. 909-554-5824 ? Social Security Administration: May apply for disability if have a Stage IV cancer. 575-380-1188 717-578-5683 ? [employer], Disability and Transit Services: Assists with nutrition, care and transit needs. [place] Support Programs: @10RELATIVEDAYS @ > Cancer Support Group  2nd Tuesday of the month 1pm-2pm, Journey Room  > Creative Journey  3rd Tuesday of the month 1130am-1pm, Journey Room  > Look Good Feel Better  1st Wednesday of the month 10am-12 noon, Journey Room (Call [place] to register [phone])

## 2016-04-27 NOTE — Progress Notes (Signed)
This encounter was created in error - please disregard.

## 2016-04-29 DIAGNOSIS — C50919 Malignant neoplasm of unspecified site of unspecified female breast: Secondary | ICD-10-CM | POA: Diagnosis not present

## 2016-05-02 DIAGNOSIS — C50919 Malignant neoplasm of unspecified site of unspecified female breast: Secondary | ICD-10-CM | POA: Diagnosis not present

## 2016-05-15 ENCOUNTER — Encounter (HOSPITAL_BASED_OUTPATIENT_CLINIC_OR_DEPARTMENT_OTHER): Payer: BLUE CROSS/BLUE SHIELD | Admitting: Hematology & Oncology

## 2016-05-15 ENCOUNTER — Encounter (HOSPITAL_COMMUNITY): Payer: BLUE CROSS/BLUE SHIELD | Attending: Hematology & Oncology

## 2016-05-15 ENCOUNTER — Encounter (HOSPITAL_COMMUNITY): Payer: Self-pay | Admitting: Hematology & Oncology

## 2016-05-15 VITALS — BP 113/64 | HR 86 | Temp 98.1°F | Resp 16 | Wt 161.2 lb

## 2016-05-15 VITALS — BP 115/60 | HR 80 | Temp 98.0°F | Resp 16

## 2016-05-15 DIAGNOSIS — L270 Generalized skin eruption due to drugs and medicaments taken internally: Secondary | ICD-10-CM

## 2016-05-15 DIAGNOSIS — M79604 Pain in right leg: Secondary | ICD-10-CM

## 2016-05-15 DIAGNOSIS — F418 Other specified anxiety disorders: Secondary | ICD-10-CM | POA: Diagnosis not present

## 2016-05-15 DIAGNOSIS — M79605 Pain in left leg: Secondary | ICD-10-CM

## 2016-05-15 DIAGNOSIS — Z01818 Encounter for other preprocedural examination: Secondary | ICD-10-CM

## 2016-05-15 DIAGNOSIS — C50412 Malignant neoplasm of upper-outer quadrant of left female breast: Secondary | ICD-10-CM | POA: Diagnosis not present

## 2016-05-15 DIAGNOSIS — Z5112 Encounter for antineoplastic immunotherapy: Secondary | ICD-10-CM | POA: Diagnosis not present

## 2016-05-15 DIAGNOSIS — Z5111 Encounter for antineoplastic chemotherapy: Secondary | ICD-10-CM

## 2016-05-15 DIAGNOSIS — Z171 Estrogen receptor negative status [ER-]: Secondary | ICD-10-CM

## 2016-05-15 LAB — COMPREHENSIVE METABOLIC PANEL WITH GFR
ALT: 16 U/L (ref 14–54)
AST: 21 U/L (ref 15–41)
Albumin: 3.9 g/dL (ref 3.5–5.0)
Alkaline Phosphatase: 52 U/L (ref 38–126)
Anion gap: 8 (ref 5–15)
BUN: 13 mg/dL (ref 6–20)
CO2: 24 mmol/L (ref 22–32)
Calcium: 9.2 mg/dL (ref 8.9–10.3)
Chloride: 104 mmol/L (ref 101–111)
Creatinine, Ser: 0.6 mg/dL (ref 0.44–1.00)
GFR calc Af Amer: >60 mL/min
GFR calc non Af Amer: >60 mL/min
Glucose, Bld: 125 mg/dL — ABNORMAL HIGH (ref 65–99)
Potassium: 3.7 mmol/L (ref 3.5–5.1)
Sodium: 136 mmol/L (ref 135–145)
Total Bilirubin: 0.6 mg/dL (ref 0.3–1.2)
Total Protein: 6.9 g/dL (ref 6.5–8.1)

## 2016-05-15 LAB — CBC WITH DIFFERENTIAL/PLATELET
Basophils Absolute: 0 10*3/uL (ref 0.0–0.1)
Basophils Relative: 0 %
EOS ABS: 0 10*3/uL (ref 0.0–0.7)
Eosinophils Relative: 0 %
HEMATOCRIT: 35.8 % — AB (ref 36.0–46.0)
HEMOGLOBIN: 12.1 g/dL (ref 12.0–15.0)
LYMPHS ABS: 2 10*3/uL (ref 0.7–4.0)
LYMPHS PCT: 20 %
MCH: 34.8 pg — AB (ref 26.0–34.0)
MCHC: 33.8 g/dL (ref 30.0–36.0)
MCV: 102.9 fL — AB (ref 78.0–100.0)
Monocytes Absolute: 0.8 10*3/uL (ref 0.1–1.0)
Monocytes Relative: 7 %
NEUTROS PCT: 73 %
Neutro Abs: 7.4 10*3/uL (ref 1.7–7.7)
Platelets: 253 10*3/uL (ref 150–400)
RBC: 3.48 MIL/uL — AB (ref 3.87–5.11)
RDW: 16.3 % — ABNORMAL HIGH (ref 11.5–15.5)
WBC: 10.2 10*3/uL (ref 4.0–10.5)

## 2016-05-15 LAB — VITAMIN B12: Vitamin B-12: 3361 pg/mL — ABNORMAL HIGH (ref 180–914)

## 2016-05-15 LAB — FOLATE: Folate: 9.7 ng/mL (ref >5.9)

## 2016-05-15 MED ORDER — PALONOSETRON HCL INJECTION 0.25 MG/5ML
0.2500 mg | Freq: Once | INTRAVENOUS | Status: AC
  Administered 2016-05-15: 0.25 mg via INTRAVENOUS
  Filled 2016-05-15: qty 5

## 2016-05-15 MED ORDER — HEPARIN SOD (PORK) LOCK FLUSH 100 UNIT/ML IV SOLN: 500.0000 [IU] | Freq: Once | INTRAVENOUS | Status: DC | PRN

## 2016-05-15 MED ORDER — TRASTUZUMAB CHEMO 150 MG IV SOLR
6.0000 mg/kg | Freq: Once | INTRAVENOUS | Status: AC
  Administered 2016-05-15: 441 mg via INTRAVENOUS
  Filled 2016-05-15: qty 21

## 2016-05-15 MED ORDER — DIPHENHYDRAMINE HCL 25 MG PO CAPS
50.0000 mg | ORAL_CAPSULE | Freq: Once | ORAL | Status: AC
  Administered 2016-05-15: 50 mg via ORAL
  Filled 2016-05-15: qty 2

## 2016-05-15 MED ORDER — DEXAMETHASONE 4 MG PO TABS: 8.0000 mg | ORAL_TABLET | Freq: Two times a day (BID) | ORAL | 1 refills | Status: DC

## 2016-05-15 MED ORDER — DEXAMETHASONE SODIUM PHOSPHATE 10 MG/ML IJ SOLN
10.0000 mg | Freq: Once | INTRAMUSCULAR | Status: AC
  Administered 2016-05-15: 10 mg via INTRAVENOUS
  Filled 2016-05-15: qty 1

## 2016-05-15 MED ORDER — FAMOTIDINE IN NACL 20-0.9 MG/50ML-% IV SOLN
20.0000 mg | Freq: Two times a day (BID) | INTRAVENOUS | Status: DC
  Administered 2016-05-15: 20 mg via INTRAVENOUS
  Filled 2016-05-15: qty 50

## 2016-05-15 MED ORDER — SODIUM CHLORIDE 0.9 % IV SOLN: 10.0000 mg | Freq: Once | INTRAVENOUS | Status: DC

## 2016-05-15 MED ORDER — SODIUM CHLORIDE 0.9 % IV SOLN
Freq: Once | INTRAVENOUS | Status: AC
  Administered 2016-05-15: 11:00:00 via INTRAVENOUS

## 2016-05-15 MED ORDER — CARBOPLATIN CHEMO INJECTION 600 MG/60ML
558.5000 mg | Freq: Once | INTRAVENOUS | Status: AC
  Administered 2016-05-15: 560 mg via INTRAVENOUS
  Filled 2016-05-15: qty 56

## 2016-05-15 MED ORDER — ACETAMINOPHEN 325 MG PO TABS
650.0000 mg | ORAL_TABLET | Freq: Once | ORAL | Status: AC
  Administered 2016-05-15: 650 mg via ORAL
  Filled 2016-05-15: qty 2

## 2016-05-15 MED ORDER — SODIUM CHLORIDE 0.9% FLUSH
10.0000 mL | INTRAVENOUS | Status: DC | PRN
  Administered 2016-05-15: 10 mL
  Filled 2016-05-15: qty 10

## 2016-05-15 NOTE — Progress Notes (Signed)
[place]  PROGRESS NOTE  Patient Care Team: Sharilyn Sites, MD as PCP - General (Family Medicine)  CHIEF COMPLAINTS/PURPOSE OF CONSULTATION:  L breast carcinoma ER- PR- HER 2 +    Breast cancer of upper-outer quadrant of left female breast ([place])   11/07/1999 Surgery    Lumpectomy/sentinel LN biopsy invasive ductal carcinoma and DCIS at [facility], 0/6 sentinel LN      11/10/1999 - 01/08/2005 Anti-estrogen oral therapy    Tamoxifen 20 mg daily      11/24/1999 - 01/02/2000 Radiation Therapy         12/18/2015 Mammogram    2D digital screening bilateral mammogram with CAD and adjunct TOMO, in the L breast a possible mass warrants further evaluation. In R breast no findings suspicious for malignancy      12/24/2015 Pathology Results    Invasive ductal carcinoma Grade 3 Left core needle biopsy 1:30 o clock ER- PR- Ki67 70%, HER 2 positive      12/24/2015 Imaging    L breast Ultrasound, notes palpable area of concern demonstrates a hypoechoic irregular mass at 1:30, 7 cm from the nipple measuring 1.3 x 0.9 x 1.4 cm. No evidence of L axillary LAD      01/07/2016 Procedure    Left simple mastectomy by Dr. Georgette Dover      01/08/2016 Pathology Results    Breast, simple mastectomy, Left - INVASIVE DUCTAL CARCINOMA, GRADE III/III, SPANNING 1.4 CM. - THE SURGICAL RESECTION MARGINS ARE NEGATIVE FOR CARCINOMA.      01/23/2016 Genetic Testing    Patient refused genetic testing at time of consultation with [name].  "Despite our recommendation, [name] did not wish to pursue genetic testing at today's visit."      01/28/2016 Echocardiogram    Left ventricle: The cavity size was normal. Wall thickness was normal. Systolic function was normal. The estimated ejection fraction was in the range of 55% to 60%. Wall motion was normal; there were no regional wall motion abnormalities. Doppler parameters are consistent with abnormal left ventricular relaxation  (grade 1 diastolic dysfunction).      01/31/2016 -  Chemotherapy    The patient had palonosetron (ALOXI) injection 0.25 mg, 0.25 mg, Intravenous,  Once, 1 of 6 cycles  pegfilgrastim (NEULASTA ONPRO KIT) injection 6 mg, 6 mg, Subcutaneous, Once, 1 of 6 cycles  CARBOplatin (PARAPLATIN) 560 mg in sodium chloride 0.9 % 250 mL chemo infusion, 560 mg (100 % of original dose 558.5 mg), Intravenous,  Once, 1 of 6 cycles Dose modification:   (original dose 558.5 mg, Cycle 1),   (original dose 558.5 mg, Cycle 2)  DOCEtaxel (TAXOTERE) 140 mg in dextrose 5 % 250 mL chemo infusion, 75 mg/m2 = 140 mg, Intravenous,  Once, 1 of 6 cycles  for chemotherapy treatment.        01/31/2016 -  Antibody Plan    Herceptin x 52 weeks       HISTORY OF PRESENTING ILLNESS:  [name] 61 y.o. female is here because for ongoing follow-up of ER- PR- HER 2 + L breast cancer.  She is here today accompanied by her husband. She presents for treatment chair for cycle 6 of [place]. I have reviewed the labs with the patient.   She notes ongoing facial erythema.    She doesn't feel as good today as she has in the past. She needs a tooth pulled due to it chipping and will go to the dentist after treatment is  over. She still has a bad taste in her mouth. She can only eat soups. Her acid reflux is still present.   Her legs hurt. She describes it has muscle pain that is worse at night. The pain is always there.   She noticed some palpitations in her heart after her last cycle.   Her vision is blurred. She tries to paint, but has to stop because of her vision, specifically from tearing of her eyes.    Denies bowel or urination problems or any other complaints. No problems with her fingers or hands.   MEDICAL HISTORY:  Past Medical History:  Diagnosis Date  . Anxiety   . Breast cancer ([place])   . Breast cancer of upper-outer quadrant of left female breast ([place]) 01/07/2016  . Complication of anesthesia   . DDD  (degenerative disc disease), cervical   . DDD (degenerative disc disease), lumbar   . Depression   . Dysrhythmia    last cardiology ov 06-13-2013 in [facility], takes metoprolol for HR  . Family history of breast cancer   . Family history of colon cancer   . Fibromyalgia   . GERD (gastroesophageal reflux disease)   . Hematuria   . History of echocardiogram 2015   mild mital insufficiency, otherwise normal  . Leaky heart valve   . Multiple thyroid nodules   . PAC (premature atrial contraction)   . Palpitations   . PONV (postoperative nausea and vomiting)   . SOB (shortness of breath)   . Tachycardia     SURGICAL HISTORY: Past Surgical History:  Procedure Laterality Date  . BREAST LUMPECTOMY    . LAPAROSCOPIC APPENDECTOMY N/A 11/10/2012   Procedure: APPENDECTOMY LAPAROSCOPIC;  Surgeon: Jamesetta So, MD;  Location: [facility];  Service: General;  Laterality: N/A;  . MASTECTOMY    . MASTECTOMY W/ SENTINEL NODE BIOPSY Left 01/07/2016  . MASTECTOMY W/ SENTINEL NODE BIOPSY Left 01/07/2016   Procedure: LEFT TOTAL MASTECTOMY WITH LEFT AXILLARY SENTINEL LYMPH NODE BIOPSY;  Surgeon: Donnie Mesa, MD;  Location: [place];  Service: General;  Laterality: Left;  . PORTACATH PLACEMENT Right 01/07/2016   IJ  . PORTACATH PLACEMENT Right 01/07/2016   Procedure: INSERTION PORT-A-CATH RIGHT INTERNAL JUGULAR WITH ULTRASOUND;  Surgeon: Donnie Mesa, MD;  Location: [place];  Service: General;  Laterality: Right;    SOCIAL HISTORY: Social History   Social History  . Marital status: Married    Spouse name: N/A  . Number of children: 2  . Years of education: N/A   Occupational History  . Not on file.   Social History Main Topics  . Smoking status: Never Smoker  . Smokeless tobacco: Never Used  . Alcohol use No  . Drug use: No  . Sexual activity: Yes     Comment: married   Other Topics Concern  . Not on file   Social History Narrative  . No narrative on file   She is married with two children and  a fourth grandchild is on the way. She was born in [place] and has 7 siblings.   Patient enjoys staying home and taking care of husband's business. She has 2 outside pets. She has never smoked. Although husband doesn't express emotion much, he is very supportive.  FAMILY HISTORY: Family History  Problem Relation Age of Onset  . Cancer Mother     colon  . Diabetes Father   . Diabetes Sister   . Alcohol abuse Brother   . Cancer Brother 29  lung cancer  . Cancer Brother     penile  . Breast cancer Maternal Aunt   . Cancer Other     unknown form of cancer   Her mom passed away at 26 from colon cancer. Her father was a diabetic and is also deceased. Two brothers are also deceased (1 was a smoker and died of lung cancer and the other was an alcoholic).   ALLERGIES:  is allergic to latex; naprosyn [naproxen]; other; and codeine.  MEDICATIONS:  Current Outpatient Prescriptions  Medication Sig Dispense Refill  . acetaminophen (TYLENOL) 500 MG tablet Take 500 mg by mouth every 6 (six) hours as needed for mild pain.    Marland Kitchen ALPRAZolam (XANAX) 0.5 MG tablet Take 1 tablet (0.5 mg total) by mouth 3 (three) times daily as needed for anxiety. 60 tablet 1  . antiseptic oral rinse (BIOTENE) LIQD 15 mLs by Mouth Rinse route as needed for dry mouth.    . CARBOPLATIN IV Inject into the vein. Every 3 weeks    . Cholecalciferol (VITAMIN D3) 1000 units CAPS Take 1,000 Units by mouth every morning.    Marland Kitchen dexamethasone (DECADRON) 4 MG tablet Take 2 tablets (8 mg total) by mouth 2 (two) times daily. Start the day before Taxotere. Then again the day after chemo for 3 days. 30 tablet 1  . DOCEtaxel (TAXOTERE IV) Inject into the vein. Every 3 weeks    . famotidine (PEPCID) 20 MG tablet Take 1 tablet (20 mg total) by mouth 2 (two) times daily. 60 tablet 2  . lidocaine-prilocaine (EMLA) cream Apply to affected area once 30 g 3  . metoprolol tartrate (LOPRESSOR) 25 MG tablet Take 12.5-25 mg by mouth 2 (two)  times daily. 25 mg in the morning. 12.5 mg in the evening,    . omeprazole (PRILOSEC) 40 MG capsule Take 1 capsule (40 mg total) by mouth 2 (two) times daily before a meal. 60 capsule 2  . ondansetron (ZOFRAN) 8 MG tablet Take 1 tablet (8 mg total) by mouth 2 (two) times daily as needed for refractory nausea / vomiting. Start on day 3 after chemo. 30 tablet 1  . oxyCODONE (OXY IR/ROXICODONE) 5 MG immediate release tablet Take 1-2 tablets (5-10 mg total) by mouth every 4 (four) hours as needed for moderate pain. 30 tablet 0  . Pegfilgrastim (NEULASTA ONPRO [place]) Inject into the skin. Every 3 weeks    . Polyethyl Glycol-Propyl Glycol (SYSTANE) 0.4-0.3 % SOLN Apply 1 drop to eye at bedtime.    . prochlorperazine (COMPAZINE) 10 MG tablet Take 1 tablet (10 mg total) by mouth every 6 (six) hours as needed (Nausea or vomiting). 30 tablet 1  . Trastuzumab (HERCEPTIN IV) Inject into the vein. Every 3 weeks    . triamcinolone ointment (KENALOG) 0.5 % Apply 1 application topically 2 (two) times daily. 30 g 4   No current facility-administered medications for this visit.     Review of Systems  Constitutional: Negative.        Bad taste in mouth  HENT:       Face swelling Face blisters  Eyes: Positive for blurred vision.  Respiratory: Negative.   Cardiovascular: Positive for palpitations.  Gastrointestinal: Negative for blood in stool, constipation, diarrhea and melena.       Pos acid reflux  Genitourinary: Negative.  Negative for dysuria, frequency, hematuria and urgency.  Musculoskeletal: Positive for myalgias (legs).       Left breast pain due to prosthesis.   Skin: Negative.  Neurological: Negative.   Endo/Heme/Allergies: Negative.   Psychiatric/Behavioral: Negative.   All other systems reviewed and are negative. 14 point ROS was done and is otherwise as detailed above or in HPI   PHYSICAL EXAMINATION: ECOG PERFORMANCE STATUS: 1 - Symptomatic but completely ambulatory  Vitals with BMI  05/15/2016  Height   Weight 161 lbs 3 oz  BMI   Systolic 315  Diastolic 64  Pulse 86  Respirations 16    Physical Exam  Constitutional: She is oriented to person, place, and time and well-developed, well-nourished, and in no distress.  Pt was able to get on exam table without assistance. Alopecia.   HENT:  Head: Normocephalic and atraumatic.  Nose: Nose normal.  Mouth/Throat: Oropharynx is clear and moist. No oropharyngeal exudate.  Eyes: Conjunctivae and EOM are normal. Pupils are equal, round, and reactive to light. Right eye exhibits no discharge. Left eye exhibits no discharge. No scleral icterus.  Tearing noted  Neck: Normal range of motion. Neck supple. No tracheal deviation present. No thyromegaly present.  Cardiovascular: Normal rate, regular rhythm and normal heart sounds.  Exam reveals no gallop and no friction rub.   No murmur heard. Pulmonary/Chest: Effort normal and breath sounds normal. She has no wheezes. She has no rales.  Abdominal: Soft. Bowel sounds are normal. She exhibits no distension and no mass. There is no tenderness. There is no rebound and no guarding.  Musculoskeletal: Normal range of motion. She exhibits no edema.  Lymphadenopathy:    She has no cervical adenopathy.  Neurological: She is alert and oriented to person, place, and time. She has normal reflexes. No cranial nerve deficit. Gait normal. Coordination normal.  Skin: Skin is warm and dry.  Psychiatric: Mood, memory, affect and judgment normal.  Nursing note and vitals reviewed.   LABORATORY DATA:  I have reviewed the data as listed Lab Results  Component Value Date   WBC 10.2 05/15/2016   HGB 12.1 05/15/2016   HCT 35.8 (L) 05/15/2016   MCV 102.9 (H) 05/15/2016   PLT 253 05/15/2016   CMP     Component Value Date/Time   NA 136 05/15/2016 0924   K 3.7 05/15/2016 0924   CL 104 05/15/2016 0924   CO2 24 05/15/2016 0924   GLUCOSE 125 (H) 05/15/2016 0924   BUN 13 05/15/2016 0924    CREATININE 0.60 05/15/2016 0924   CALCIUM 9.2 05/15/2016 0924   PROT 6.9 05/15/2016 0924   ALBUMIN 3.9 05/15/2016 0924   AST 21 05/15/2016 0924   ALT 16 05/15/2016 0924   ALKPHOS 52 05/15/2016 0924   BILITOT 0.6 05/15/2016 0924   GFRNONAA >60 05/15/2016 0924   GFRAA >60 05/15/2016 0924     RADIOGRAPHIC STUDIES: I have personally reviewed the radiological images as listed and agreed with the findings in the report.  CLINICAL DATA:  61 year old who simultaneously underwent left mastectomy for breast cancer and had a Port-A-Cath placed intraoperatively.  EXAM: PORTABLE CHEST 1 VIEW  COMPARISON:  01/12/2015, 12/31/2014 and earlier.  FINDINGS: Right jugular Port-A-Cath tip projects at or near the expected location of the cavoatrial junction. No evidence of pneumothorax or mediastinal hematoma. Postsurgical changes related to left mastectomy with skin staples overlying the left hemithorax. Suboptimal inspiration accounts for crowded bronchovascular markings, especially in the bases, and accentuates the cardiac silhouette. Taking this into account, cardiomediastinal silhouette unremarkable and lungs clear.  IMPRESSION: 1. Right jugular Port-A-Cath tip at or near the expected location of the cavoatrial junction. No acute complicating features. 2.  Suboptimal inspiration.  No acute cardiopulmonary disease.   Electronically Signed   By: Evangeline Dakin M.D.   On: 01/07/2016 10:26   PATHOLOGY:     ASSESSMENT & PLAN:  L breast cancer upper outer quadrant ER- PR- HER 2 + Prior history L breast cancer ER+, lumpectomy, XRT, Tamoxifen [facility]  Anxiety about health GERD  She will complete her last cycle today. Given her leg complaints however I am going to discontinue taxotere for her last cycle.  Continue on Herceptin/Carboplatin only. Discontinue Taxotere.   Echo in 2 weeks and then every 3 months. Complete 52 weeks of herceptin. Disease is ER-  She has had problems  with facial erythema, I suspect steroids and perhaps taxotere, will continue to monitor. This should improve now that she is finished with therapy.  If GERD does not improve moving forward I have advised her that she needs an EGD.   She will return for a follow up in 3 weeks.   Orders Placed This Encounter  Procedures  . ECHOCARDIOGRAM COMPLETE    Standing Status:   Future    Standing Expiration Date:   08/13/2017    Order Specific Question:   Where should this test be performed    Answer:   [facility]    Order Specific Question:   Complete or Limited study?    Answer:   Complete    Order Specific Question:   Does the patient have a known history of hypersensitivity to Perflutren (Chief Technology Officer for echocardiograms - CHECK ALLERGIES)    Answer:   Yes    Order Specific Question:   DO NOT administer Perflutren    Answer:   DO NOT administer Perflutren    Order Specific Question:   Expected Date:    Answer:   Other - See Comments    Comments:   2weeks    Order Specific Question:   Reason for exam-Echo    Answer:   Chemotherapy evaluation  v87.41 / v58.11   All questions were answered. The patient knows to call the clinic with any problems, questions or concerns.  This document serves as a record of services personally performed by Ancil Linsey, MD. It was created on her behalf by Martinique Casey, a trained medical scribe. The creation of this record is based on the scribe's personal observations and the provider's statements to them. This document has been checked and approved by the attending provider.  I have reviewed the above documentation for accuracy and completeness and I agree with the above.  This note was electronically signed.    Molli Hazard, MD  05/15/2016 10:12 AM

## 2016-05-15 NOTE — Patient Instructions (Signed)
[place] at [facility] Discharge Instructions  RECOMMENDATIONS MADE BY THE CONSULTANT AND ANY TEST RESULTS WILL BE SENT TO YOUR REFERRING PHYSICIAN.  You were seen today by Dr. Youlanda Roys will have lab work drawn today D/C [place] will be scheduled for Echocardiogram Follow up in 3 weeks for Herceptin, lab work and appointment  Thank you for choosing [place] at [facility] to provide your oncology and hematology care.  To afford each patient quality time with our provider, please arrive at least 15 minutes before your scheduled appointment time.    If you have a lab appointment with the [place] [place] [facility] please come in thru the  Main Entrance and check in at the main information desk  You need to re-schedule your appointment should you arrive 10 or more minutes late.  We strive to give you quality time with our providers, and arriving late affects you and other patients whose appointments are after yours.  Also, if you no show three or more times for appointments you may be dismissed from the clinic at the providers discretion.     Again, thank you for choosing [facility].  Our hope is that these requests will decrease the amount of time that you wait before being seen by our physicians.       _____________________________________________________________  Should you have questions after your visit to [facility], please contact our office at [phone] between the hours of 8:30 a.m. and 4:30 p.m.  Voicemails left after 4:30 p.m. will not be returned until the following business day.  For prescription refill requests, have your pharmacy contact our office.       Resources For Cancer Patients and their Caregivers ? American Cancer Society: Can assist with transportation, wigs, general needs, runs Look Good Feel Better.        (934)438-2302 ? Cancer Care: Provides financial assistance, online support  groups, medication/co-pay assistance.  1-800-813-HOPE (747)878-7562) ? [facility] Assists [place] cancer patients and their families through emotional , educational and financial support.  252-432-9875 ? [place] DSS Where to apply for food stamps, Medicaid and utility assistance. 814-266-3882 ? RCATS: Transportation to medical appointments. 626-769-1314 ? Social Security Administration: May apply for disability if have a Stage IV cancer. 629-146-5353 (210)553-0861 ? [employer], Disability and Transit Services: Assists with nutrition, care and transit needs. [employer] Support Programs: @10RELATIVEDAYS @ > Cancer Support Group  2nd Tuesday of the month 1pm-2pm, Journey Room  > Creative Journey  3rd Tuesday of the month 1130am-1pm, Journey Room  > Look Good Feel Better  1st Wednesday of the month 10am-12 noon, Journey Room (Call [place] to register [phone])

## 2016-05-15 NOTE — Progress Notes (Signed)
No taxotere today per Dr. Whitney Muse. Chemotherapy given today per orders and patient tolerated it well, no problems. Follow up as scheduled and discharged ambulatory from clinic.

## 2016-05-15 NOTE — Patient Instructions (Signed)
[facility] Discharge Instructions for Patients Receiving Chemotherapy   Beginning January 23rd 2017 lab work for the Cancer Center will be done in the  Main lab at [place] on 1st floor. If you have a lab appointment with the Cancer Center please come in thru the  Main Entrance and check in at the main information desk   Today you received the following chemotherapy agents   To help prevent nausea and vomiting after your treatment, we encourage you to take your nausea medication     If you develop nausea and vomiting, or diarrhea that is not controlled by your medication, call the clinic.  The clinic phone number is [phone]. Office hours are Monday-Friday 8:30am-5:00pm.  BELOW ARE SYMPTOMS THAT SHOULD BE REPORTED IMMEDIATELY:  *FEVER GREATER THAN 101.0 F  *CHILLS WITH OR WITHOUT FEVER  NAUSEA AND VOMITING THAT IS NOT CONTROLLED WITH YOUR NAUSEA MEDICATION  *UNUSUAL SHORTNESS OF BREATH  *UNUSUAL BRUISING OR BLEEDING  TENDERNESS IN MOUTH AND THROAT WITH OR WITHOUT PRESENCE OF ULCERS  *URINARY PROBLEMS  *BOWEL PROBLEMS  UNUSUAL RASH Items with * indicate a potential emergency and should be followed up as soon as possible. If you have an emergency after office hours please contact your primary care physician or go to the nearest emergency department.  Please call the clinic during office hours if you have any questions or concerns.   You may also contact the Patient Navigator at [phone] should you have any questions or need assistance in obtaining follow up care.      Resources For Cancer Patients and their Caregivers ? American Cancer Society: Can assist with transportation, wigs, general needs, runs Look Good Feel Better.        1-888-227-6333 ? Cancer Care: Provides financial assistance, online support groups, medication/co-pay assistance.  1-800-813-HOPE (4673) ? [facility] Assists [place] cancer  patients and their families through emotional , educational and financial support.  336-427-4357 ? [place] DSS Where to apply for food stamps, Medicaid and utility assistance. 336-342-1394 ? RCATS: Transportation to medical appointments. 336-347-2287 ? Social Security Administration: May apply for disability if have a Stage IV cancer. 336-342-7796 1-800-772-1213 ? [place] Aging, Disability and Transit Services: Assists with nutrition, care and transit needs. 336-349-2343         

## 2016-05-21 ENCOUNTER — Other Ambulatory Visit (HOSPITAL_COMMUNITY): Payer: BLUE CROSS/BLUE SHIELD

## 2016-05-23 ENCOUNTER — Other Ambulatory Visit (HOSPITAL_COMMUNITY): Payer: Self-pay | Admitting: Hematology & Oncology

## 2016-05-23 DIAGNOSIS — K219 Gastro-esophageal reflux disease without esophagitis: Secondary | ICD-10-CM

## 2016-05-26 ENCOUNTER — Ambulatory Visit (HOSPITAL_COMMUNITY)
Admission: RE | Admit: 2016-05-26 | Discharge: 2016-05-26 | Disposition: A | Discharge status: Home / Self Care | Payer: BLUE CROSS/BLUE SHIELD | Source: Ambulatory Visit | Attending: Hematology & Oncology | Admitting: Hematology & Oncology

## 2016-05-26 DIAGNOSIS — R002 Palpitations: Secondary | ICD-10-CM | POA: Insufficient documentation

## 2016-05-26 DIAGNOSIS — I071 Rheumatic tricuspid insufficiency: Secondary | ICD-10-CM | POA: Diagnosis not present

## 2016-05-26 DIAGNOSIS — Z01818 Encounter for other preprocedural examination: Secondary | ICD-10-CM

## 2016-05-26 DIAGNOSIS — Z01810 Encounter for preprocedural cardiovascular examination: Secondary | ICD-10-CM | POA: Diagnosis not present

## 2016-05-26 DIAGNOSIS — C50912 Malignant neoplasm of unspecified site of left female breast: Secondary | ICD-10-CM | POA: Diagnosis not present

## 2016-05-26 LAB — ECHOCARDIOGRAM COMPLETE
AVLVOTPG: 4 mmHg
CHL CUP MV DEC (S): 208
CHL CUP RV SYS PRESS: 24 mmHg
CHL CUP STROKE VOLUME: 38 mL
E decel time: 208 msec
EERAT: 6.23
FS: 34 % (ref 28–44)
IV/PV OW: 0.79
LA ID, A-P, ES: 35 mm
LA diam index: 1.88 cm/m2
LA vol A4C: 50 ml
LA vol: 40.9 mL
LAVOLIN: 22 mL/m2
LDCA: 2.27 cm2
LEFT ATRIUM END SYS DIAM: 35 mm
LV E/e' medial: 6.23
LV SIMPSON'S DISK: 69
LV dias vol: 55 mL (ref 46–106)
LV e' LATERAL: 10.9 cm/s
LV sys vol index: 9 mL/m2
LV sys vol: 17 mL (ref 14–42)
LVDIAVOLIN: 29 mL/m2
LVEEAVG: 6.23
LVOT VTI: 24.4 cm
LVOT diameter: 17 mm
LVOTPV: 105 cm/s
LVOTSV: 55 mL
Lateral S' vel: 10.1 cm/s
MV pk A vel: 66.2 m/s
MVPKEVEL: 67.9 m/s
PW: 9.65 mm — AB (ref 0.6–1.1)
Reg peak vel: 228 cm/s
TAPSE: 18.1 mm
TDI e' lateral: 10.9
TDI e' medial: 8.27
TR max vel: 228 cm/s

## 2016-05-26 NOTE — Progress Notes (Signed)
*  PRELIMINARY RESULTS* Echocardiogram 2D Echocardiogram has been performed.  Samuel Germany 05/26/2016, 1:41 PM

## 2016-05-27 ENCOUNTER — Other Ambulatory Visit (HOSPITAL_COMMUNITY): Payer: Self-pay | Admitting: Hematology & Oncology

## 2016-05-27 NOTE — Progress Notes (Signed)
ON PATHWAY REGIMEN - Breast  No Change  Continue With Treatment as Ordered.  [id number]: [facility] - Docetaxel, Carboplatin With Concurrent Trastuzumab q21 Days x 6 Cycles, Followed by Trastuzumab  q21 Days x 11 Cycles  Docetaxel + Carboplatin + Trastuzumab [facility]):   A cycle is every 21 days:     Trastuzumab (Herceptin(R)) 8 mg/kg in 250 mL NS IV over 90 minutes as a loading dose first dose only. LVEF assessment recommended at baseline. Dose Mod: None     Trastuzumab (Herceptin(R)) 6 mg/kg in 250 mL NS IV over 30 minutes as a maintenance dose subsequent cycles. Recommended Monitoring: LVEF q3-4 months for adjuvant treatment, or with the development of cardiac symptoms and regimen changes for  metastatic treatment. Dose Mod: None     Docetaxel (Taxotere(R)) 75 mg/m2 in 250 mL NS IV over 1 hour followed by Dose Mod: None     Carboplatin (Paraplatin(R)) AUC=6 in 250 mL NS IV over 30-60 minutes day 1 only Dose Mod: None Additional Orders: Premedicate with dexamethasone 8 mg PO bid for three days beginning 1 day prior to therapy * All AUC calculations intended to be used in [place] formula.  **Always confirm dose/schedule in your pharmacy ordering system**    Trastuzumab (Maintenance Post [facility]CH) x 11 Cycles:   A cycle is every 21 days:     Trastuzumab (Herceptin(R)) 6 mg/kg in 250 mL NS IV over 30 minutes, if tolerated previously. Dose Mod: None Additional Orders: Recommended Monitoring: LVEF q3-4 months for adjuvant treatment, or with the development of cardiac symptoms and regimen changes for metastatic treatment.  **Always confirm dose/schedule in your pharmacy ordering system**    Patient Characteristics: Adjuvant Therapy, Node Negative, HER2/neu Positive, ER Negative, Stage Ia and Ib, T1c AJCC Stage Grouping: IA Current Disease Status: No Distant Mets or Local Recurrence AJCC M Stage: 0 ER Status: Negative (-) AJCC N Stage: 0 AJCC T Stage: 1c HER2/neu: Positive (+) PR Status: Negative  (-) Node Status: Negative (-)  Intent of Therapy: Curative Intent, Discussed with Patient

## 2016-05-29 DIAGNOSIS — Z9012 Acquired absence of left breast and nipple: Secondary | ICD-10-CM | POA: Diagnosis not present

## 2016-05-29 DIAGNOSIS — C50912 Malignant neoplasm of unspecified site of left female breast: Secondary | ICD-10-CM | POA: Diagnosis not present

## 2016-05-29 DIAGNOSIS — Z9889 Other specified postprocedural states: Secondary | ICD-10-CM | POA: Diagnosis not present

## 2016-05-30 ENCOUNTER — Encounter (HOSPITAL_COMMUNITY): Payer: Self-pay | Admitting: Hematology & Oncology

## 2016-06-04 ENCOUNTER — Encounter (HOSPITAL_COMMUNITY): Payer: Self-pay | Admitting: Hematology & Oncology

## 2016-06-05 ENCOUNTER — Other Ambulatory Visit (HOSPITAL_COMMUNITY): Payer: Self-pay | Admitting: Oncology

## 2016-06-05 ENCOUNTER — Encounter (HOSPITAL_COMMUNITY): Payer: BLUE CROSS/BLUE SHIELD | Attending: Hematology & Oncology

## 2016-06-05 ENCOUNTER — Ambulatory Visit (HOSPITAL_COMMUNITY): Payer: BLUE CROSS/BLUE SHIELD | Admitting: Hematology & Oncology

## 2016-06-05 VITALS — BP 112/60 | HR 80 | Temp 97.8°F | Resp 18 | Wt 162.6 lb

## 2016-06-05 DIAGNOSIS — Z171 Estrogen receptor negative status [ER-]: Secondary | ICD-10-CM

## 2016-06-05 DIAGNOSIS — C50412 Malignant neoplasm of upper-outer quadrant of left female breast: Secondary | ICD-10-CM | POA: Insufficient documentation

## 2016-06-05 DIAGNOSIS — Z5112 Encounter for antineoplastic immunotherapy: Secondary | ICD-10-CM | POA: Diagnosis not present

## 2016-06-05 LAB — CBC WITH DIFFERENTIAL/PLATELET
BASOS ABS: 0 10*3/uL (ref 0.0–0.1)
Basophils Relative: 0 %
EOS ABS: 0.1 10*3/uL (ref 0.0–0.7)
Eosinophils Relative: 2 %
HEMATOCRIT: 35.6 % — AB (ref 36.0–46.0)
Hemoglobin: 11.9 g/dL — ABNORMAL LOW (ref 12.0–15.0)
LYMPHS ABS: 1.7 10*3/uL (ref 0.7–4.0)
Lymphocytes Relative: 59 %
MCH: 35.3 pg — ABNORMAL HIGH (ref 26.0–34.0)
MCHC: 33.4 g/dL (ref 30.0–36.0)
MCV: 105.6 fL — ABNORMAL HIGH (ref 78.0–100.0)
MONO ABS: 0.3 10*3/uL (ref 0.1–1.0)
MONOS PCT: 11 %
Neutro Abs: 0.8 10*3/uL — ABNORMAL LOW (ref 1.7–7.7)
Neutrophils Relative %: 28 %
PLATELETS: 155 10*3/uL (ref 150–400)
RBC: 3.37 MIL/uL — AB (ref 3.87–5.11)
RDW: 15.2 % (ref 11.5–15.5)
WBC: 2.9 10*3/uL — AB (ref 4.0–10.5)

## 2016-06-05 LAB — COMPREHENSIVE METABOLIC PANEL
ALBUMIN: 3.6 g/dL (ref 3.5–5.0)
ALT: 17 U/L (ref 14–54)
ANION GAP: 6 (ref 5–15)
AST: 22 U/L (ref 15–41)
Alkaline Phosphatase: 48 U/L (ref 38–126)
BUN: 12 mg/dL (ref 6–20)
CHLORIDE: 105 mmol/L (ref 101–111)
CO2: 25 mmol/L (ref 22–32)
Calcium: 8.9 mg/dL (ref 8.9–10.3)
Creatinine, Ser: 0.63 mg/dL (ref 0.44–1.00)
GFR calc Af Amer: >60 mL/min (ref >60)
GFR calc non Af Amer: >60 mL/min (ref >60)
GLUCOSE: 103 mg/dL — AB (ref 65–99)
POTASSIUM: 3.9 mmol/L (ref 3.5–5.1)
SODIUM: 136 mmol/L (ref 135–145)
Total Bilirubin: 0.7 mg/dL (ref 0.3–1.2)
Total Protein: 6.2 g/dL — ABNORMAL LOW (ref 6.5–8.1)

## 2016-06-05 MED ORDER — HEPARIN SOD (PORK) LOCK FLUSH 100 UNIT/ML IV SOLN
500.0000 [IU] | Freq: Once | INTRAVENOUS | Status: AC | PRN
  Administered 2016-06-05: 500 [IU]
  Filled 2016-06-05: qty 5

## 2016-06-05 MED ORDER — SODIUM CHLORIDE 0.9 % IV SOLN
Freq: Once | INTRAVENOUS | Status: AC
  Administered 2016-06-05: 10:00:00 via INTRAVENOUS

## 2016-06-05 MED ORDER — SODIUM CHLORIDE 0.9% FLUSH: 10.0000 mL | INTRAVENOUS | Status: AC | PRN

## 2016-06-05 MED ORDER — ACETAMINOPHEN 325 MG PO TABS
650.0000 mg | ORAL_TABLET | Freq: Once | ORAL | Status: AC
  Administered 2016-06-05: 650 mg via ORAL
  Filled 2016-06-05: qty 2

## 2016-06-05 MED ORDER — TRASTUZUMAB CHEMO 150 MG IV SOLR
6.0000 mg/kg | Freq: Once | INTRAVENOUS | Status: AC
  Administered 2016-06-05: 441 mg via INTRAVENOUS
  Filled 2016-06-05: qty 21

## 2016-06-05 MED ORDER — DIPHENHYDRAMINE HCL 25 MG PO CAPS
50.0000 mg | ORAL_CAPSULE | Freq: Once | ORAL | Status: AC
  Administered 2016-06-05: 50 mg via ORAL
  Filled 2016-06-05: qty 2

## 2016-06-15 IMAGING — US US CAROTID DUPLEX BILAT
1 series · 13 of 24 positions shown · non-contrast
Comparison: None.

CLINICAL DATA: Chest pain.  Screening procedure.

EXAM:
BILATERAL CAROTID DUPLEX ULTRASOUND
TECHNIQUE: Gray scale imaging, color Doppler and duplex ultrasound were
performed of bilateral carotid and vertebral arteries in the neck.

[Series 1: us carotid duplex bilat · 0.04mm/px · 13 of 72 slices shown]
[im 1/72]
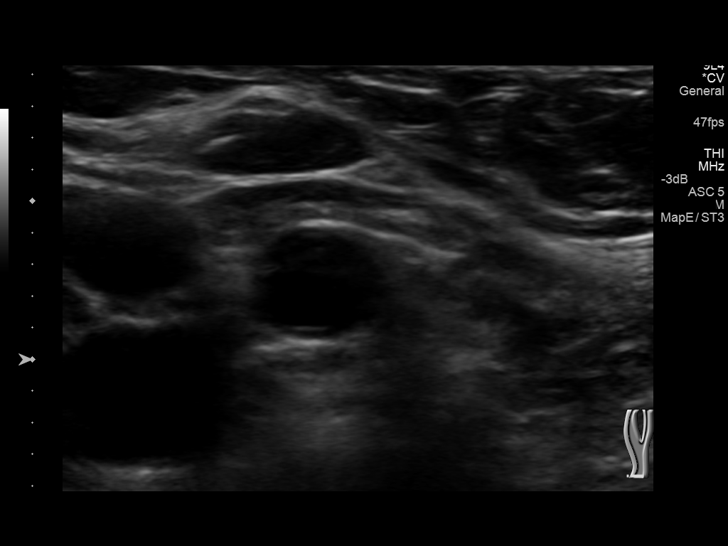
[im 7/72]
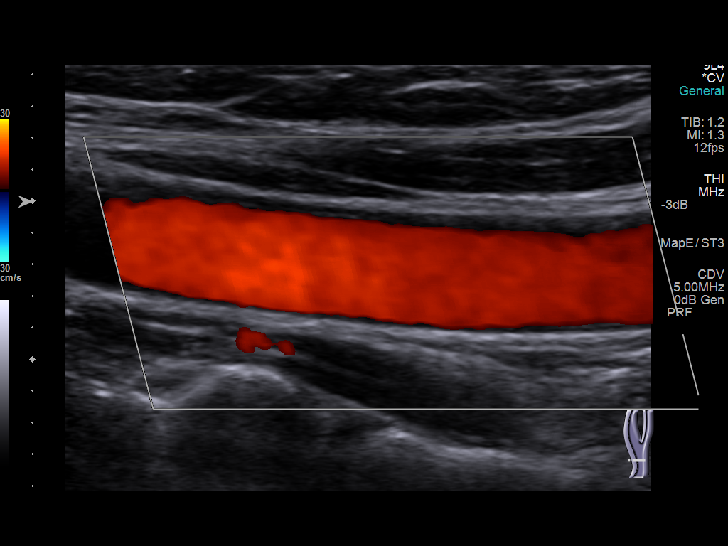
[im 13/72]
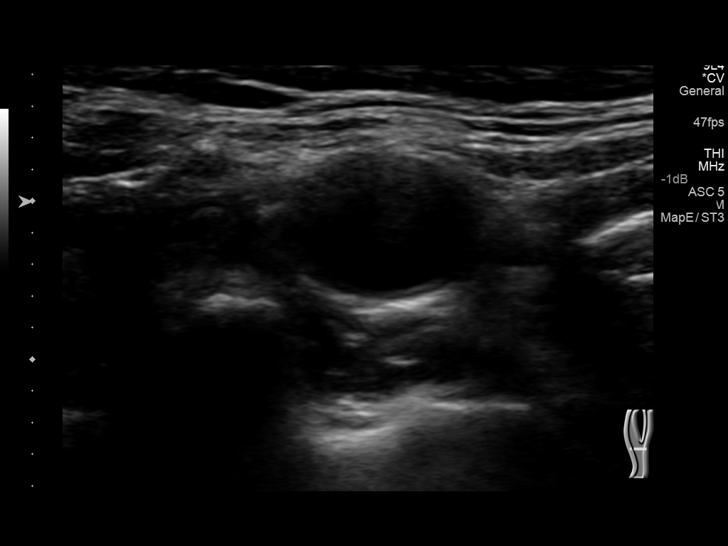
[im 19/72]
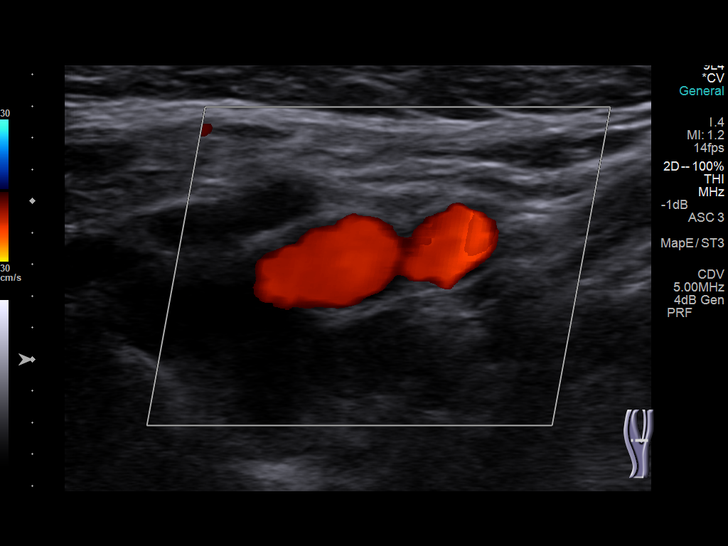
[im 25/72]
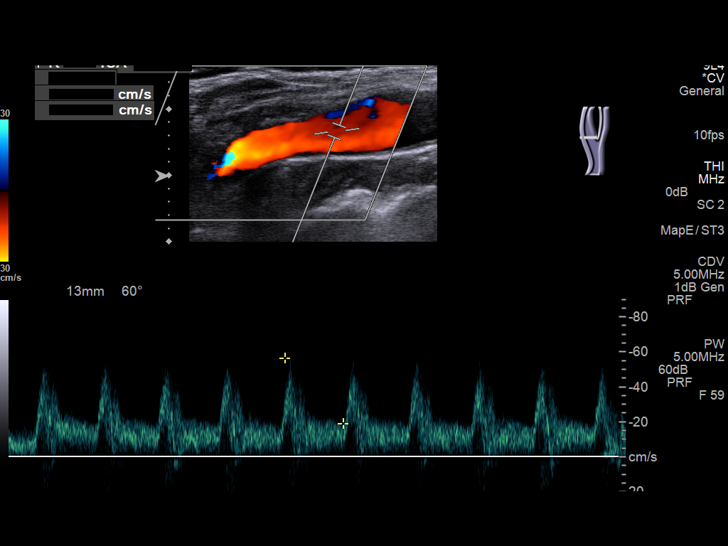
[im 31/72]
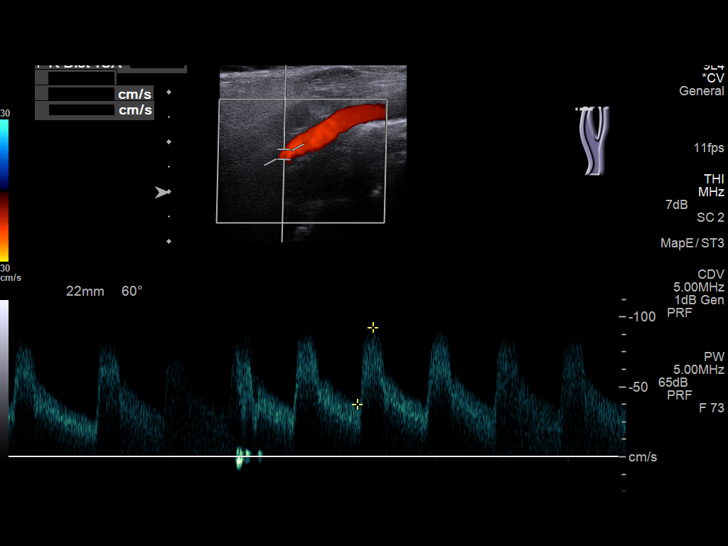
[im 38/72]
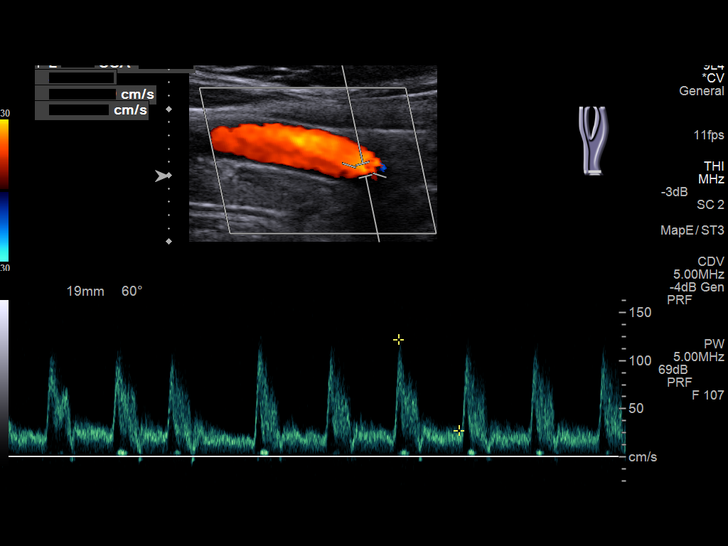
[im 41/72]
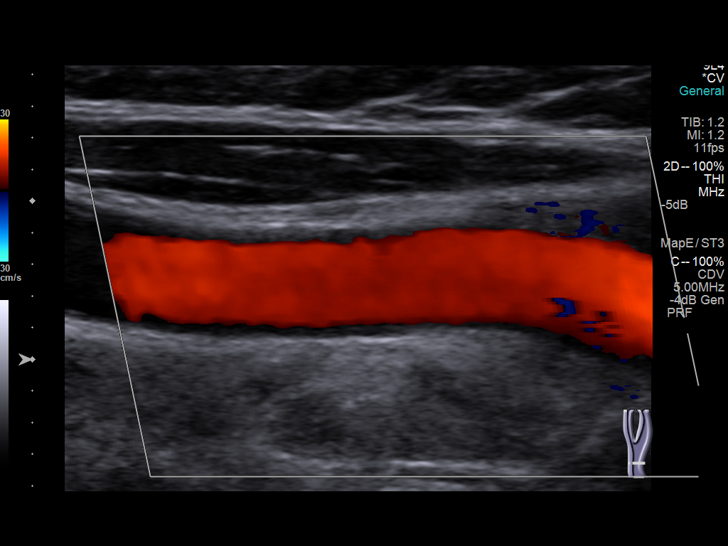
[im 47/72]
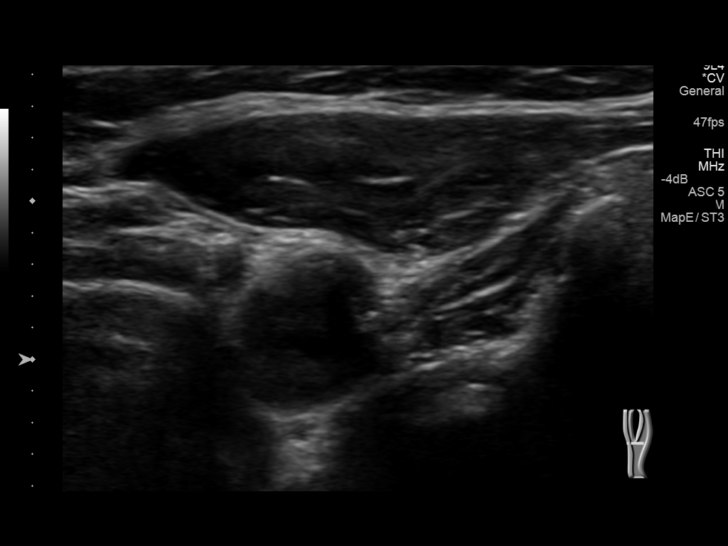
[im 53/72]
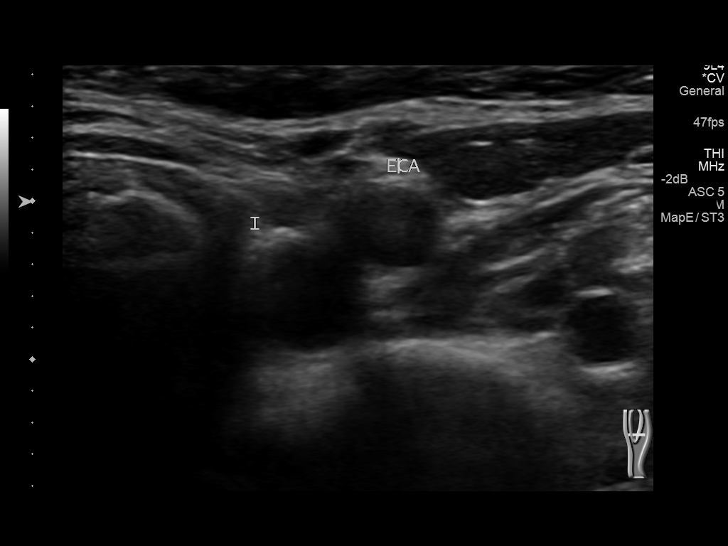
[im 59/72]
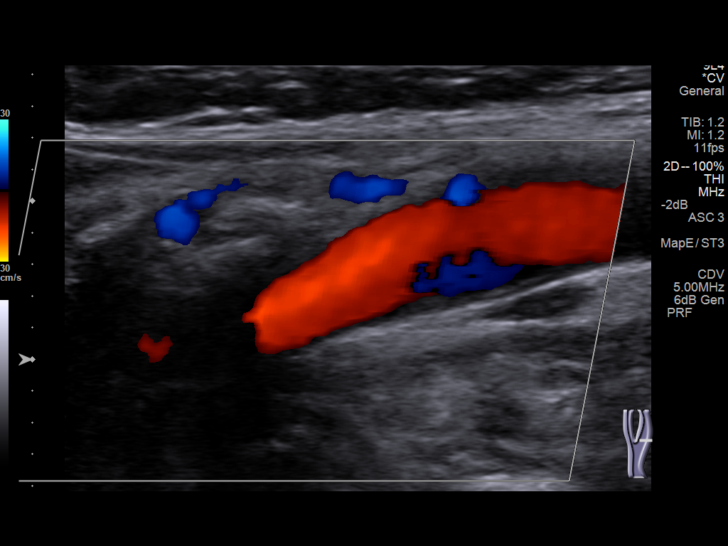
[im 65/72]
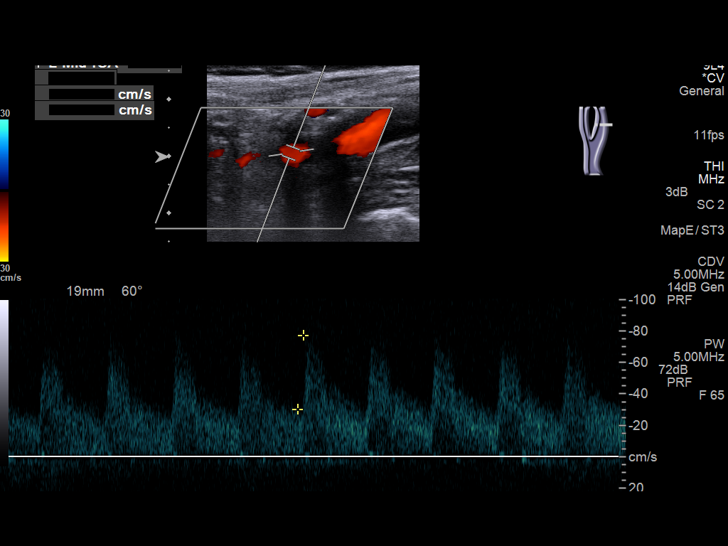
[im 72/72]
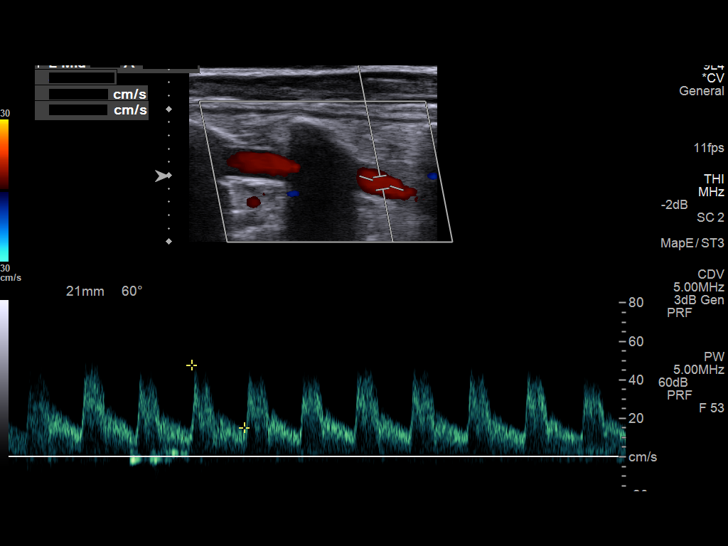

[13 of 24 positions shown; findings below may reference images not displayed]

FINDINGS: Criteria: Quantification of carotid stenosis is based on velocity
parameters that correlate the residual internal carotid diameter
with NASCET-based stenosis levels, using the diameter of the distal
internal carotid lumen as the denominator for stenosis measurement.

The following velocity measurements were obtained:

RIGHT

ICA:  97/29 cm/sec

CCA:  92/21 cm/sec

SYSTOLIC ICA/CCA RATIO:

DIASTOLIC ICA/CCA RATIO:

ECA:  176 cm/sec

LEFT

ICA:  82/20 cm/sec

CCA:  96/24 cm/sec

SYSTOLIC ICA/CCA RATIO:

DIASTOLIC ICA/CCA RATIO:

ECA:  117 cm/sec

RIGHT CAROTID ARTERY: Mild right carotid bifurcation plaque. No flow
limiting stenosis. Waveforms unremarkable.

RIGHT VERTEBRAL ARTERY:  Patent with antegrade flow.

LEFT CAROTID ARTERY: Mild left carotid bifurcation plaque. No flow
limiting stenosis. Waveforms unremarkable.

LEFT VERTEBRAL ARTERY:  Patent with antegrade flow.
IMPRESSION: 1. Mild bilateral carotid bifurcation atherosclerotic vascular
disease. No flow limiting stenosis. Degree of stenosis less than
50%.
2. Vertebral arteries are patent with antegrade flow.

## 2016-06-26 ENCOUNTER — Encounter (HOSPITAL_BASED_OUTPATIENT_CLINIC_OR_DEPARTMENT_OTHER): Payer: BLUE CROSS/BLUE SHIELD

## 2016-06-26 ENCOUNTER — Other Ambulatory Visit (HOSPITAL_COMMUNITY): Payer: Self-pay | Admitting: Oncology

## 2016-06-26 ENCOUNTER — Other Ambulatory Visit: Payer: Self-pay

## 2016-06-26 ENCOUNTER — Encounter (HOSPITAL_BASED_OUTPATIENT_CLINIC_OR_DEPARTMENT_OTHER): Payer: BLUE CROSS/BLUE SHIELD | Admitting: Adult Health

## 2016-06-26 ENCOUNTER — Ambulatory Visit (HOSPITAL_COMMUNITY): Payer: BLUE CROSS/BLUE SHIELD | Admitting: Adult Health

## 2016-06-26 ENCOUNTER — Encounter (HOSPITAL_COMMUNITY): Payer: Self-pay | Admitting: Adult Health

## 2016-06-26 VITALS — BP 125/63 | HR 73 | Temp 97.7°F | Resp 16 | Wt 162.6 lb

## 2016-06-26 DIAGNOSIS — C50412 Malignant neoplasm of upper-outer quadrant of left female breast: Secondary | ICD-10-CM

## 2016-06-26 DIAGNOSIS — Z171 Estrogen receptor negative status [ER-]: Principal | ICD-10-CM

## 2016-06-26 DIAGNOSIS — Z5111 Encounter for antineoplastic chemotherapy: Secondary | ICD-10-CM

## 2016-06-26 DIAGNOSIS — R079 Chest pain, unspecified: Secondary | ICD-10-CM

## 2016-06-26 DIAGNOSIS — Z5112 Encounter for antineoplastic immunotherapy: Secondary | ICD-10-CM | POA: Diagnosis not present

## 2016-06-26 DIAGNOSIS — R42 Dizziness and giddiness: Secondary | ICD-10-CM

## 2016-06-26 DIAGNOSIS — Z170 Estrogen receptor positive status [ER+]: Principal | ICD-10-CM

## 2016-06-26 DIAGNOSIS — Z1231 Encounter for screening mammogram for malignant neoplasm of breast: Secondary | ICD-10-CM

## 2016-06-26 LAB — CBC WITH DIFFERENTIAL/PLATELET
BASOS ABS: 0 10*3/uL (ref 0.0–0.1)
BASOS PCT: 1 %
EOS PCT: 8 %
Eosinophils Absolute: 0.4 10*3/uL (ref 0.0–0.7)
HEMATOCRIT: 39 % (ref 36.0–46.0)
Hemoglobin: 13 g/dL (ref 12.0–15.0)
Lymphocytes Relative: 52 %
Lymphs Abs: 2.3 10*3/uL (ref 0.7–4.0)
MCH: 34.3 pg — ABNORMAL HIGH (ref 26.0–34.0)
MCHC: 33.3 g/dL (ref 30.0–36.0)
MCV: 102.9 fL — AB (ref 78.0–100.0)
MONO ABS: 0.4 10*3/uL (ref 0.1–1.0)
MONOS PCT: 10 %
NEUTROS ABS: 1.3 10*3/uL — AB (ref 1.7–7.7)
Neutrophils Relative %: 29 %
Platelets: 188 10*3/uL (ref 150–400)
RBC: 3.79 MIL/uL — ABNORMAL LOW (ref 3.87–5.11)
RDW: 13 % (ref 11.5–15.5)
WBC: 4.4 10*3/uL (ref 4.0–10.5)

## 2016-06-26 LAB — COMPREHENSIVE METABOLIC PANEL
ALBUMIN: 3.8 g/dL (ref 3.5–5.0)
ALT: 20 U/L (ref 14–54)
ANION GAP: 5 (ref 5–15)
AST: 32 U/L (ref 15–41)
Alkaline Phosphatase: 59 U/L (ref 38–126)
BILIRUBIN TOTAL: 0.3 mg/dL (ref 0.3–1.2)
BUN: 11 mg/dL (ref 6–20)
CHLORIDE: 106 mmol/L (ref 101–111)
CO2: 26 mmol/L (ref 22–32)
Calcium: 8.9 mg/dL (ref 8.9–10.3)
Creatinine, Ser: 0.66 mg/dL (ref 0.44–1.00)
GFR calc Af Amer: >60 mL/min (ref >60)
Glucose, Bld: 97 mg/dL (ref 65–99)
POTASSIUM: 4 mmol/L (ref 3.5–5.1)
Sodium: 137 mmol/L (ref 135–145)
TOTAL PROTEIN: 6.8 g/dL (ref 6.5–8.1)

## 2016-06-26 MED ORDER — TRASTUZUMAB CHEMO 150 MG IV SOLR
6.0000 mg/kg | Freq: Once | INTRAVENOUS | Status: AC
  Administered 2016-06-26: 441 mg via INTRAVENOUS
  Filled 2016-06-26: qty 21

## 2016-06-26 MED ORDER — MECLIZINE HCL 25 MG PO TABS: 25.0000 mg | ORAL_TABLET | Freq: Three times a day (TID) | ORAL | 0 refills | Status: DC | PRN

## 2016-06-26 MED ORDER — HEPARIN SOD (PORK) LOCK FLUSH 100 UNIT/ML IV SOLN
500.0000 [IU] | Freq: Once | INTRAVENOUS | Status: AC | PRN
  Administered 2016-06-26: 500 [IU]
  Filled 2016-06-26: qty 5

## 2016-06-26 MED ORDER — SODIUM CHLORIDE 0.9 % IV SOLN
Freq: Once | INTRAVENOUS | Status: AC
  Administered 2016-06-26: 11:00:00 via INTRAVENOUS

## 2016-06-26 MED ORDER — DIPHENHYDRAMINE HCL 25 MG PO CAPS
ORAL_CAPSULE | ORAL | Status: AC
  Filled 2016-06-26: qty 2

## 2016-06-26 MED ORDER — ACETAMINOPHEN 325 MG PO TABS
650.0000 mg | ORAL_TABLET | Freq: Once | ORAL | Status: AC
  Administered 2016-06-26: 650 mg via ORAL

## 2016-06-26 MED ORDER — SODIUM CHLORIDE 0.9% FLUSH
10.0000 mL | INTRAVENOUS | Status: DC | PRN
  Administered 2016-06-26: 10 mL
  Filled 2016-06-26: qty 10

## 2016-06-26 MED ORDER — ACETAMINOPHEN 325 MG PO TABS
ORAL_TABLET | ORAL | Status: AC
  Filled 2016-06-26: qty 2

## 2016-06-26 MED ORDER — DIPHENHYDRAMINE HCL 25 MG PO CAPS
50.0000 mg | ORAL_CAPSULE | Freq: Once | ORAL | Status: AC
  Administered 2016-06-26: 50 mg via ORAL

## 2016-06-26 NOTE — Progress Notes (Signed)
Chemotherapy given today per orders, patient tolerated it well. Vitals stable and discharged from clinic ambulatory. Follow up as scheduled.

## 2016-06-26 NOTE — Patient Instructions (Signed)
[facility] Discharge Instructions for Patients Receiving Chemotherapy   Beginning January 23rd 2017 lab work for the Cancer Center will be done in the  Main lab at [facility] on 1st floor. If you have a lab appointment with the Cancer Center please come in thru the  Main Entrance and check in at the main information desk   Today you received the following chemotherapy agents   To help prevent nausea and vomiting after your treatment, we encourage you to take your nausea medication     If you develop nausea and vomiting, or diarrhea that is not controlled by your medication, call the clinic.  The clinic phone number is [phone]. Office hours are Monday-Friday 8:30am-5:00pm.  BELOW ARE SYMPTOMS THAT SHOULD BE REPORTED IMMEDIATELY:  *FEVER GREATER THAN 101.0 F  *CHILLS WITH OR WITHOUT FEVER  NAUSEA AND VOMITING THAT IS NOT CONTROLLED WITH YOUR NAUSEA MEDICATION  *UNUSUAL SHORTNESS OF BREATH  *UNUSUAL BRUISING OR BLEEDING  TENDERNESS IN MOUTH AND THROAT WITH OR WITHOUT PRESENCE OF ULCERS  *URINARY PROBLEMS  *BOWEL PROBLEMS  UNUSUAL RASH Items with * indicate a potential emergency and should be followed up as soon as possible. If you have an emergency after office hours please contact your primary care physician or go to the nearest emergency department.  Please call the clinic during office hours if you have any questions or concerns.   You may also contact the Patient Navigator at [phone] should you have any questions or need assistance in obtaining follow up care.      Resources For Cancer Patients and their Caregivers ? American Cancer Society: Can assist with transportation, wigs, general needs, runs Look Good Feel Better.        1-888-227-6333 ? Cancer Care: Provides financial assistance, online support groups, medication/co-pay assistance.  1-800-813-HOPE (4673) ? [facility] Assists [place] cancer  patients and their families through emotional , educational and financial support.  336-427-4357 ? [place] DSS Where to apply for food stamps, Medicaid and utility assistance. 336-342-1394 ? [facility]: Transportation to medical appointments. 336-347-2287 ? Social Security Administration: May apply for disability if have a Stage IV cancer. 336-342-7796 1-800-772-1213 ? [place] Aging, Disability and Transit Services: Assists with nutrition, care and transit needs. 336-349-2343         

## 2016-06-26 NOTE — Patient Instructions (Addendum)
[place] at [facility] Discharge Instructions  RECOMMENDATIONS MADE BY THE CONSULTANT AND ANY TEST RESULTS WILL BE SENT TO YOUR REFERRING PHYSICIAN.  Exam with Mike Craze, NP. Return to the clinic in three weeks for treatment.  You will be seen for follow up with the provider in 6 weeks with your treatment that day.   We are also going to schedule your echo and your mammogram.   Please see Amy for all appointments as you leave.    Thank you for choosing [place] at [facility] to provide your oncology and hematology care.  To afford each patient quality time with our provider, please arrive at least 15 minutes before your scheduled appointment time.    If you have a lab appointment with the [place] please come in thru the  Main Entrance and check in at the main information desk  You need to re-schedule your appointment should you arrive 10 or more minutes late.  We strive to give you quality time with our providers, and arriving late affects you and other patients whose appointments are after yours.  Also, if you no show three or more times for appointments you may be dismissed from the clinic at the providers discretion.     Again, thank you for choosing [facility].  Our hope is that these requests will decrease the amount of time that you wait before being seen by our physicians.       _____________________________________________________________  Should you have questions after your visit to [facility], please contact our office at [phone] between the hours of 8:30 a.m. and 4:30 p.m.  Voicemails left after 4:30 p.m. will not be returned until the following business day.  For prescription refill requests, have your pharmacy contact our office.       Resources For Cancer Patients and their Caregivers ? American Cancer Society: Can assist with transportation, wigs, general needs, runs Look Good  Feel Better.        204 292 6145 ? Cancer Care: Provides financial assistance, online support groups, medication/co-pay assistance.  1-800-813-HOPE 867-027-6134) ? [facility] Assists [place] cancer patients and their families through emotional , educational and financial support.  4421377251 ? [place] DSS Where to apply for food stamps, Medicaid and utility assistance. (269)651-6276 ? [facility]: Transportation to medical appointments. 620-461-2914 ? Social Security Administration: May apply for disability if have a Stage IV cancer. 938-647-0804 (534)774-3636 ? [employer], Disability and Transit Services: Assists with nutrition, care and transit needs. [place] Support Programs: @10RELATIVEDAYS @ > Cancer Support Group  2nd Tuesday of the month 1pm-2pm, Journey Room  > Creative Journey  3rd Tuesday of the month 1130am-1pm, Journey Room  > Look Good Feel Better  1st Wednesday of the month 10am-12 noon, Journey Room (Call [place] to register [phone])

## 2016-06-26 NOTE — Pre-Procedure Instructions (Signed)
EKG-06/26/16

## 2016-06-26 NOTE — Progress Notes (Signed)
[facility] [place], [place] [address]   CLINIC:  Medical Oncology/Hematology  PCP:  Purvis Kilts, [place] [address] [phone]   REASON FOR VISIT:  Follow-up for Stage IA left breast invasive ductal carcinoma; ER-/PR-/HER2+  CURRENT THERAPY: Maintenance Herceptin every 21 days    BRIEF ONCOLOGIC HISTORY:    Breast cancer of upper-outer quadrant of left female breast ([place])   11/07/1999 Surgery    Lumpectomy/sentinel LN biopsy invasive ductal carcinoma and DCIS at [facility], 0/6 sentinel LN      11/10/1999 - 01/08/2005 Anti-estrogen oral therapy    Tamoxifen 20 mg daily      11/24/1999 - 01/02/2000 Radiation Therapy         12/18/2015 Mammogram    2D digital screening bilateral mammogram with CAD and adjunct TOMO, in the L breast a possible mass warrants further evaluation. In R breast no findings suspicious for malignancy      12/24/2015 Pathology Results    Invasive ductal carcinoma Grade 3 Left core needle biopsy 1:30 o clock ER- PR- Ki67 70%, HER 2 positive      12/24/2015 Imaging    L breast Ultrasound, notes palpable area of concern demonstrates a hypoechoic irregular mass at 1:30, 7 cm from the nipple measuring 1.3 x 0.9 x 1.4 cm. No evidence of L axillary LAD      01/07/2016 Procedure    Left simple mastectomy by Dr. Georgette Dover      01/08/2016 Pathology Results    Breast, simple mastectomy, Left - INVASIVE DUCTAL CARCINOMA, GRADE III/III, SPANNING 1.4 CM. - THE SURGICAL RESECTION MARGINS ARE NEGATIVE FOR CARCINOMA.      01/23/2016 Genetic Testing    Patient refused genetic testing at time of consultation with Roma Kayser.  "Despite our recommendation, Ms. Nurse did not wish to pursue genetic testing at today's visit."      01/28/2016 Echocardiogram    Left ventricle: The cavity size was normal. Wall thickness was normal. Systolic function was normal. The estimated ejection fraction was in  the range of 55% to 60%. Wall motion was normal; there were no regional wall motion abnormalities. Doppler parameters are consistent with abnormal left ventricular relaxation (grade 1 diastolic dysfunction).      01/31/2016 - 05/15/2016 Chemotherapy    The patient had palonosetron (ALOXI) injection 0.25 mg, 0.25 mg, Intravenous,  Once, 1 of 6 cycles  pegfilgrastim (NEULASTA ONPRO KIT) injection 6 mg, 6 mg, Subcutaneous, Once, 1 of 6 cycles  CARBOplatin (PARAPLATIN) 560 mg in sodium chloride 0.9 % 250 mL chemo infusion, 560 mg (100 % of original dose 558.5 mg), Intravenous,  Once, 1 of 6 cycles Dose modification:   (original dose 558.5 mg, Cycle 1),   (original dose 558.5 mg, Cycle 2)  DOCEtaxel (TAXOTERE) 140 mg in dextrose 5 % 250 mL chemo infusion, 75 mg/m2 = 140 mg, Intravenous,  Once, 1 of 6 cycles  for chemotherapy treatment.        01/31/2016 -  Antibody Plan    Herceptin x 52 weeks        INTERVAL HISTORY:  Ms. Thomassen is here for follow-up and consideration for next cycle of maintenance Herceptin therapy.    She tells me she is continuing to feel better since her last [facility] chemotherapy. Her appetite and energy levels are slowly improving, but she continues to feel very tired. Her taste is beginning to improve as well. Reports occasional dizziness, which she shares that  she has a chronic history of vertigo; symptoms similar to her previous episodes of vertigo. She has been taking Dramamine OTC; she is wondering if there is anything else that she could take.   Endorses peripheral neuropathy to her feet and hands; also endorses occasional numbness/tingling to portions of her face. Her facial numbness is resolving since completing Taxotere/Carbo chemotherapy   She reports an episode of severe chest pain after her treatment last cycle; this would have been about 3 weeks ago. She states she had constant chest pain for 3 days, that eventually resolved on its own. Denies any chest  pain at present. She was unable to tell me if there were activities that worsened her symptoms. She notes having chest pain at rest during this episode; denies experiencing any associated jaw pain or diaphoresis. When asked about arm pain, she states, "It's hard for me to tell because my left arm always hurts since my mastectomy and lymph node surgery." She did not report this chest pain episode to any medical provider, nor did she seek evaluation in the emergency department. She endorses a history of GERD, "but this felt different than any heartburn have ever had." Reports that this was not her first incidence of chest pain, but it was the most recent. She has never had a formal cardiology evaluation to her knowledge. Her last echocardiogram was done on 05/26/16 and showed ejection fraction 55-60%. I reviewed these results today with the patient.     REVIEW OF SYSTEMS:  Review of Systems  Constitutional: Positive for appetite change and fatigue. Negative for chills and fever.  Eyes: Negative.   Respiratory: Negative.  Negative for cough and shortness of breath.   Cardiovascular: Positive for chest pain. Negative for palpitations.     PAST MEDICAL/SURGICAL HISTORY:  Past Medical History:  Diagnosis Date  . Anxiety   . Breast cancer ([place])   . Breast cancer of upper-outer quadrant of left female breast ([place]) 01/07/2016  . Complication of anesthesia   . DDD (degenerative disc disease), cervical   . DDD (degenerative disc disease), lumbar   . Depression   . Dysrhythmia    last cardiology ov 06-13-2013 in [facility], takes metoprolol for HR  . Family history of breast cancer   . Family history of colon cancer   . Fibromyalgia   . GERD (gastroesophageal reflux disease)   . Hematuria   . History of echocardiogram 2015   mild mital insufficiency, otherwise normal  . Leaky heart valve   . Multiple thyroid nodules   . PAC (premature atrial contraction)   . Palpitations   . PONV (postoperative nausea  and vomiting)   . SOB (shortness of breath)   . Tachycardia    Past Surgical History:  Procedure Laterality Date  . BREAST LUMPECTOMY    . LAPAROSCOPIC APPENDECTOMY N/A 11/10/2012   Procedure: APPENDECTOMY LAPAROSCOPIC;  Surgeon: Jamesetta So, MD;  Location: [facility];  Service: General;  Laterality: N/A;  . MASTECTOMY    . MASTECTOMY W/ SENTINEL NODE BIOPSY Left 01/07/2016  . MASTECTOMY W/ SENTINEL NODE BIOPSY Left 01/07/2016   Procedure: LEFT TOTAL MASTECTOMY WITH LEFT AXILLARY SENTINEL LYMPH NODE BIOPSY;  Surgeon: Donnie Mesa, MD;  Location: [facility];  Service: General;  Laterality: Left;  . PORTACATH PLACEMENT Right 01/07/2016   IJ  . PORTACATH PLACEMENT Right 01/07/2016   Procedure: INSERTION PORT-A-CATH RIGHT INTERNAL JUGULAR WITH ULTRASOUND;  Surgeon: Donnie Mesa, MD;  Location: [facility];  Service: General;  Laterality: Right;  SOCIAL HISTORY:  Social History   Social History  . Marital status: Married    Spouse name: N/A  . Number of children: 2  . Years of education: N/A   Occupational History  . Not on file.   Social History Main Topics  . Smoking status: Never Smoker  . Smokeless tobacco: Never Used  . Alcohol use No  . Drug use: No  . Sexual activity: Yes     Comment: married   Other Topics Concern  . Not on file   Social History Narrative  . No narrative on file    FAMILY HISTORY:  Family History  Problem Relation Age of Onset  . Cancer Mother     colon  . Diabetes Father   . Diabetes Sister   . Alcohol abuse Brother   . Cancer Brother 33    lung cancer  . Cancer Brother     penile  . Breast cancer Maternal Aunt   . Cancer Other     unknown form of cancer    CURRENT MEDICATIONS:  Outpatient Encounter Prescriptions as of 06/26/2016  Medication Sig Note  . acetaminophen (TYLENOL) 500 MG tablet Take 500 mg by mouth every 6 (six) hours as needed for mild pain.   Marland Kitchen ALPRAZolam (XANAX) 0.5 MG tablet Take 1 tablet (0.5 mg total) by mouth 3 (three)  times daily as needed for anxiety.   Marland Kitchen antiseptic oral rinse (BIOTENE) LIQD 15 mLs by Mouth Rinse route as needed for dry mouth.   . CARBOPLATIN IV Inject into the vein. Every 3 weeks   . Cholecalciferol (VITAMIN D3) 1000 units CAPS Take 1,000 Units by mouth every morning.   Marland Kitchen dexamethasone (DECADRON) 4 MG tablet Take 2 tablets (8 mg total) by mouth 2 (two) times daily. Start the day before Taxotere. Then again the day after chemo for 3 days.   . DOCEtaxel (TAXOTERE IV) Inject into the vein. Every 3 weeks   . famotidine (PEPCID) 20 MG tablet Take 1 tablet (20 mg total) by mouth 2 (two) times daily.   . metoprolol tartrate (LOPRESSOR) 25 MG tablet Take 12.5-25 mg by mouth 2 (two) times daily. 25 mg in the morning. 12.5 mg in the evening, 01/07/2016: 330 am  . omeprazole (PRILOSEC) 40 MG capsule take 1 capsule twice a day with food   . oxyCODONE (OXY IR/ROXICODONE) 5 MG immediate release tablet Take 1-2 tablets (5-10 mg total) by mouth every 4 (four) hours as needed for moderate pain.   Marland Kitchen Pegfilgrastim (NEULASTA ONPRO [place]) Inject into the skin. Every 3 weeks   . Polyethyl Glycol-Propyl Glycol (SYSTANE) 0.4-0.3 % SOLN Apply 1 drop to eye at bedtime.   . Trastuzumab (HERCEPTIN IV) Inject into the vein. Every 3 weeks   . triamcinolone ointment (KENALOG) 0.5 % Apply 1 application topically 2 (two) times daily.   . meclizine (ANTIVERT) 25 MG tablet Take 1 tablet (25 mg total) by mouth 3 (three) times daily as needed for dizziness.    Facility-Administered Encounter Medications as of 06/26/2016  Medication  . [COMPLETED] 0.9 %  sodium chloride infusion  . [COMPLETED] acetaminophen (TYLENOL) tablet 650 mg  . [COMPLETED] diphenhydrAMINE (BENADRYL) capsule 50 mg  . [COMPLETED] heparin lock flush 100 unit/mL  . sodium chloride flush (NS) 0.9 % injection 10 mL  . [COMPLETED] trastuzumab (HERCEPTIN) 441 mg in sodium chloride 0.9 % 250 mL chemo infusion  . [DISCONTINUED] sodium chloride flush (NS) 0.9 %  injection 10 mL  . acetaminophen (TYLENOL)  325 MG tablet  . diphenhydrAMINE (BENADRYL) 25 mg capsule    ALLERGIES:  Allergies  Allergen Reactions  . Latex Itching  . Naprosyn [Naproxen] Other (See Comments)    UNSPECIFIED REACTION   . Other     USE PAPER TAPE ONLY  . Codeine Nausea And Vomiting     PHYSICAL EXAM:  ECOG Performance status: 1 - Symptomatic, but largely independent      Physical Exam  Constitutional: She is oriented to person, place, and time and well-developed, well-nourished, and in no distress.  HENT:  Head: Normocephalic.  Mouth/Throat: Oropharynx is clear and moist. No oropharyngeal exudate.  Eyes: Conjunctivae are normal. Pupils are equal, round, and reactive to light. No scleral icterus.  Neck: Normal range of motion. Neck supple.  Cardiovascular: Normal rate, regular rhythm and normal heart sounds.   Pulmonary/Chest: Effort normal and breath sounds normal. No respiratory distress.  Abdominal: Soft. Bowel sounds are normal. There is no tenderness.  Musculoskeletal: Normal range of motion. She exhibits no edema.  Lymphadenopathy:    She has no cervical adenopathy.       Right: No supraclavicular adenopathy present.       Left: No supraclavicular adenopathy present.  Neurological: She is alert and oriented to person, place, and time. No cranial nerve deficit. Gait normal.  Skin: Skin is warm and dry. No rash noted. She is not diaphoretic. There is pallor.  Psychiatric: Mood, memory, affect and judgment normal.  Nursing note and vitals reviewed.    LABORATORY DATA:  I have reviewed the labs as listed.  CBC    Component Value Date/Time   WBC 4.4 06/26/2016 0911   RBC 3.79 (L) 06/26/2016 0911   HGB 13.0 06/26/2016 0911   HCT 39.0 06/26/2016 0911   PLT 188 06/26/2016 0911   MCV 102.9 (H) 06/26/2016 0911   MCH 34.3 (H) 06/26/2016 0911   MCHC 33.3 06/26/2016 0911   RDW 13.0 06/26/2016 0911   LYMPHSABS 2.3 06/26/2016 0911   MONOABS 0.4  06/26/2016 0911   EOSABS 0.4 06/26/2016 0911   BASOSABS 0.0 06/26/2016 0911   CMP Latest Ref Rng & Units 06/26/2016 06/05/2016 05/15/2016  Glucose 65 - 99 mg/dL 97 103(H) 125(H)  BUN 6 - 20 mg/dL _0 Creatinine 0.44 - 1.00 mg/dL 0.66 0.63 0.60  Sodium 135 - 145 mmol/L 137 136 136  Potassium 3.5 - 5.1 mmol/L 4.0 3.9 3.7  Chloride 101 - 111 mmol/L 106 105 104  CO2 22 - 32 mmol/L _1 Calcium 8.9 - 10.3 mg/dL 8.9 8.9 9.2  Total Protein 6.5 - 8.1 g/dL 6.8 6.2(L) 6.9  Total Bilirubin 0.3 - 1.2 mg/dL 0.3 0.7 0.6  Alkaline Phos 38 - 126 U/L 59 48 52  AST 15 - 41 U/L 32 22 21  ALT 14 - 54 U/L _2 PENDING LABS:    DIAGNOSTIC IMAGING:  EKG: 06/26/16   Most recent ECHO:     PATHOLOGY:  Surgical path: 01/07/16     ASSESSMENT & PLAN:   Stage IA left breast invasive ductal carcinoma, ER-/PR-/HER2+: -s/p mastectomy and Taxotere/Carbo/Herceptin x 6 cycles; now on maintenance Herceptin every 3 weeks to total 1 year of therapy. She will complete Herceptin in 01/2017.   -Right breast unilateral mammogram due in 12/2016; orders placed today.   Intermittent chest pain/Possible cardiotoxicity secondary to Herceptin:  -Reportedly had 3 days of constant chest pain about 3 weeks ago. Did not report this chest pain  nor seek medical attention at that time. Denies chest pain during visit today.  -EKG done today in the office, which showed normal sinus rhythm, but there was notation "cannot rule out anterior infarct, age undetermined."  Reviewed EKG myself and with Dr. Talbert Cage.  It is okay to proceed with chemotherapy today, as there is no evidence of acute ischemia/infarct on EKG, but she does need evaluation with cardiologist.   -Referral placed for patient to see Dr. Haroldine Laws in [place], who is an expert in cardio-oncology management of possible cardiotoxicity.  She may require stress test.  -She will be due for routine ECHO on 08/2016; orders placed today.  -I reinforced the  importance of her seeking emergency medical attention if she experiences constant chest pain again. We discussed that often women experience different symptoms when having a myocardial infarction. She understands that Herceptin can be cardiotoxic, but her last echocardiogram was done in January and was reassuring. If she has another episode of chest pain, she is to call 911 or go to the closest emergency room. She voiced understanding of these recommendations.  Intermittent vertigo:  -Chronic issue for patient that pre-dates her cancer diagnosis or treatment.  -E-prescribed Meclizine for her to take prn for vertigo symptoms.    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     Dispo:  -Referral to cardiology for evaluation of chest pain.  -Return to cancer center in 3 weeks for next cycle Herceptin.  -Next ECHO due in 08/2016; orders placed today.  -Screening right breast mammogram due in 12/2016; orders placed today.    All questions were answered to patient's stated satisfaction. Encouraged patient to call with any new concerns or questions before her next visit to the cancer center and we can certain see her sooner, if needed.    Plan of care discussed with Dr. Talbert Cage, who agrees with the above aforementioned.    Orders placed this encounter:  Orders Placed This Encounter  Procedures  . MM SCREENING BREAST TOMO UNI R  . Ambulatory referral to Cardiology  . EKG 12-Lead  . ECHOCARDIOGRAM COMPLETE      Mike Craze, NP [place] [phone]

## 2016-06-29 ENCOUNTER — Telehealth (HOSPITAL_COMMUNITY): Payer: Self-pay | Admitting: Vascular Surgery

## 2016-07-01 ENCOUNTER — Ambulatory Visit (HOSPITAL_COMMUNITY): Payer: BLUE CROSS/BLUE SHIELD | Admitting: Internal Medicine

## 2016-07-08 ENCOUNTER — Ambulatory Visit (HOSPITAL_COMMUNITY)
Admission: RE | Admit: 2016-07-08 | Discharge: 2016-07-08 | Disposition: A | Discharge status: Home / Self Care | Payer: BLUE CROSS/BLUE SHIELD | Source: Ambulatory Visit | Attending: Internal Medicine | Admitting: Internal Medicine

## 2016-07-08 ENCOUNTER — Encounter (HOSPITAL_COMMUNITY): Payer: Self-pay

## 2016-07-08 VITALS — BP 124/80 | HR 83 | Wt 159.8 lb

## 2016-07-08 DIAGNOSIS — M797 Fibromyalgia: Secondary | ICD-10-CM | POA: Diagnosis not present

## 2016-07-08 DIAGNOSIS — R002 Palpitations: Secondary | ICD-10-CM

## 2016-07-08 DIAGNOSIS — Z803 Family history of malignant neoplasm of breast: Secondary | ICD-10-CM | POA: Insufficient documentation

## 2016-07-08 DIAGNOSIS — F329 Major depressive disorder, single episode, unspecified: Secondary | ICD-10-CM | POA: Diagnosis not present

## 2016-07-08 DIAGNOSIS — C50912 Malignant neoplasm of unspecified site of left female breast: Secondary | ICD-10-CM | POA: Insufficient documentation

## 2016-07-08 DIAGNOSIS — K219 Gastro-esophageal reflux disease without esophagitis: Secondary | ICD-10-CM | POA: Insufficient documentation

## 2016-07-08 DIAGNOSIS — C50919 Malignant neoplasm of unspecified site of unspecified female breast: Secondary | ICD-10-CM

## 2016-07-08 DIAGNOSIS — Z171 Estrogen receptor negative status [ER-]: Secondary | ICD-10-CM

## 2016-07-08 DIAGNOSIS — Z9012 Acquired absence of left breast and nipple: Secondary | ICD-10-CM | POA: Insufficient documentation

## 2016-07-08 DIAGNOSIS — I208 Other forms of angina pectoris: Secondary | ICD-10-CM

## 2016-07-08 NOTE — Progress Notes (Signed)
Oncology: [name]  61 yo with history of palpitations and breast cancer presents for cardio-oncology evaluation.   Breast cancer was initially diagnosed on left in 2001, treated with lumpectomy and radiation.   She had a recurrence on the left diagnosed in 8/17.  ER-/PR-/HER2+.  She had left mastectomy in 9/17.  She has completed chemotherapy with docetaxel and carboplatin.  She is now getting Herceptin.  She has had 2 echoes since starting Herceptin, both with normal LV function.   In 2/18, she had 2 days of on and off chest pain.  The pain was central and aching. No radiation, no definitely triggered by exertion.  She has not had a recurrence since that time.  ECG was unremarkable.  Currently, no chest pain.  She has had episodes of dyspnea for 1-2 months.  These do not seem to have a pattern, not clearly exertional.  She has no history of smoking, diabetes, or HTN.  No family history of CAD.   ECG (2/18, personally reviewed): NSR, normal  PMH: 1. H/o palpitations.  2. Degenerative disease disease. 3. Depression 4. GERD 5. Fibromyalgia 6. Breast cancer: Initially on left in 2001, treated with lumpectomy and radiation.   She had a recurrence on the left in 8/17.  ER-/PR-/HER2+.  She had left mastectomy in 9/17.  She has completed chemotherapy with docetaxel and carboplatin.  She is now getting Herceptin.   - Echo (9/17): EF 55-60%. - Echo (1/18): EF 55-60%.   Social History   Social History  . Marital status: Married    Spouse name: N/A  . Number of children: 2  . Years of education: N/A   Occupational History  . Not on file.   Social History Main Topics  . Smoking status: Never Smoker  . Smokeless tobacco: Never Used  . Alcohol use No  . Drug use: No  . Sexual activity: Yes     Comment: married   Other Topics Concern  . Not on file   Social History Narrative  . No narrative on file   Family History  Problem Relation Age of Onset  . Cancer Mother     colon  .  Diabetes Father   . Diabetes Sister   . Alcohol abuse Brother   . Cancer Brother 15    lung cancer  . Cancer Brother     penile  . Breast cancer Maternal Aunt   . Cancer Other     unknown form of cancer   ROS: All systems reviewed and negative except as per HPI.   Current Outpatient Prescriptions  Medication Sig Dispense Refill  . antiseptic oral rinse (BIOTENE) LIQD 15 mLs by Mouth Rinse route as needed for dry mouth.    . meclizine (ANTIVERT) 25 MG tablet Take 1 tablet (25 mg total) by mouth 3 (three) times daily as needed for dizziness. 30 tablet 0  . metoprolol tartrate (LOPRESSOR) 25 MG tablet Take 12.5-25 mg by mouth 2 (two) times daily. 25 mg in the morning. 12.5 mg in the evening,    . Polyethyl Glycol-Propyl Glycol (SYSTANE) 0.4-0.3 % SOLN Apply 1 drop to eye at bedtime.    . Trastuzumab (HERCEPTIN IV) Inject into the vein. Every 3 weeks    . triamcinolone ointment (KENALOG) 0.5 % Apply 1 application topically 2 (two) times daily. 30 g 4  . acetaminophen (TYLENOL) 500 MG tablet Take 500 mg by mouth every 6 (six) hours as needed for mild pain.    Marland Kitchen ALPRAZolam Duanne Moron)  0.5 MG tablet Take 1 tablet (0.5 mg total) by mouth 3 (three) times daily as needed for anxiety. (Patient not taking: Reported on 07/08/2016) 60 tablet 1  . CARBOPLATIN IV Inject into the vein. Every 3 weeks    . Cholecalciferol (VITAMIN D3) 1000 units CAPS Take 1,000 Units by mouth every morning.    Marland Kitchen dexamethasone (DECADRON) 4 MG tablet Take 2 tablets (8 mg total) by mouth 2 (two) times daily. Start the day before Taxotere. Then again the day after chemo for 3 days. (Patient not taking: Reported on 07/08/2016) 30 tablet 1  . DOCEtaxel (TAXOTERE IV) Inject into the vein. Every 3 weeks    . famotidine (PEPCID) 20 MG tablet Take 1 tablet (20 mg total) by mouth 2 (two) times daily. (Patient not taking: Reported on 07/08/2016) 60 tablet 2  . omeprazole (PRILOSEC) 40 MG capsule take 1 capsule twice a day with food (Patient not  taking: Reported on 07/08/2016) 60 capsule 2  . oxyCODONE (OXY IR/ROXICODONE) 5 MG immediate release tablet Take 1-2 tablets (5-10 mg total) by mouth every 4 (four) hours as needed for moderate pain. (Patient not taking: Reported on 07/08/2016) 30 tablet 0  . Pegfilgrastim (NEULASTA ONPRO [place]) Inject into the skin. Every 3 weeks     No current facility-administered medications for this encounter.    Facility-Administered Medications Ordered in Other Encounters  Medication Dose Route Frequency Provider Last Rate Last Dose  . sodium chloride flush (NS) 0.9 % injection 10 mL  10 mL Intracatheter PRN Patrici Ranks, MD       BP 124/80 (BP Location: Right Arm, Patient Position: Sitting, Cuff Size: Normal)   Pulse 83   Wt 159 lb 12.8 oz (72.5 kg)   SpO2 99%   BMI 25.79 kg/m  General: NAD Neck: No JVD, no thyromegaly or thyroid nodule.  Lungs: Clear to auscultation bilaterally with normal respiratory effort. CV: Nondisplaced PMI.  Heart regular S1/S2, no S3/S4, no murmur.  No peripheral edema.  No carotid bruit.  Normal pedal pulses.  Abdomen: Soft, nontender, no hepatosplenomegaly, no distention.  Skin: Intact without lesions or rashes.  Neurologic: Alert and oriented x 3.  Psych: Normal affect. Extremities: No clubbing or cyanosis.  HEENT: Normal.   Assessment/Plan: 1. Breast cancer: She is getting Herceptin based treatment.  We discussed the rationale behind echo screening while on Herceptin.  She has had 2 echoes since treatment started, LV function stable so far.  She will have a repeat echo at 3 months, which will be in 4/18.  She should continue to have echoes at 3 month intervals (as long as function remains normal) until Herceptin is completed.  2. Chest pain: Atypical chest pain.  She had multiple episodes over a day period.  Also has on and off shortness of breath.  - I will arrange for ETT-Cardiolite for risk stratification.  She wants to have this done in [place].   Loralie Champagne 07/08/2016

## 2016-07-08 NOTE — Patient Instructions (Signed)
Your physician recommends that you schedule a follow-up appointment as needed only    Your physician has requested that you have a lexiscan myoview. For further information please visit [email or web address]. Please follow instruction sheet, as given.

## 2016-07-17 ENCOUNTER — Encounter (HOSPITAL_COMMUNITY): Payer: Self-pay

## 2016-07-17 ENCOUNTER — Encounter (HOSPITAL_COMMUNITY): Payer: BLUE CROSS/BLUE SHIELD | Attending: Hematology & Oncology

## 2016-07-17 VITALS — BP 99/60 | HR 72 | Temp 97.9°F | Resp 18 | Wt 160.8 lb

## 2016-07-17 DIAGNOSIS — Z171 Estrogen receptor negative status [ER-]: Secondary | ICD-10-CM

## 2016-07-17 DIAGNOSIS — Z5112 Encounter for antineoplastic immunotherapy: Secondary | ICD-10-CM

## 2016-07-17 DIAGNOSIS — C50412 Malignant neoplasm of upper-outer quadrant of left female breast: Secondary | ICD-10-CM | POA: Diagnosis not present

## 2016-07-17 MED ORDER — SODIUM CHLORIDE 0.9 % IV SOLN
Freq: Once | INTRAVENOUS | Status: AC
  Administered 2016-07-17: 10:00:00 via INTRAVENOUS

## 2016-07-17 MED ORDER — SODIUM CHLORIDE 0.9 % IV SOLN
6.0000 mg/kg | Freq: Once | INTRAVENOUS | Status: AC
  Administered 2016-07-17: 441 mg via INTRAVENOUS
  Filled 2016-07-17: qty 21

## 2016-07-17 MED ORDER — DIPHENHYDRAMINE HCL 25 MG PO CAPS
50.0000 mg | ORAL_CAPSULE | Freq: Once | ORAL | Status: AC
  Administered 2016-07-17: 50 mg via ORAL

## 2016-07-17 MED ORDER — HEPARIN SOD (PORK) LOCK FLUSH 100 UNIT/ML IV SOLN
500.0000 [IU] | Freq: Once | INTRAVENOUS | Status: AC | PRN
  Administered 2016-07-17: 500 [IU]
  Filled 2016-07-17: qty 5

## 2016-07-17 MED ORDER — DIPHENHYDRAMINE HCL 25 MG PO CAPS
ORAL_CAPSULE | ORAL | Status: AC
  Filled 2016-07-17: qty 2

## 2016-07-17 MED ORDER — ACETAMINOPHEN 325 MG PO TABS
ORAL_TABLET | ORAL | Status: AC
  Filled 2016-07-17: qty 2

## 2016-07-17 MED ORDER — ACETAMINOPHEN 325 MG PO TABS
650.0000 mg | ORAL_TABLET | Freq: Once | ORAL | Status: AC
  Administered 2016-07-17: 650 mg via ORAL

## 2016-07-17 NOTE — Progress Notes (Signed)
Tolerated infusion w/o adverse reaction.  Alert, in no distress.  VSS.  Discharged ambulatory in c/o spouse for transport home.  

## 2016-07-17 NOTE — Patient Instructions (Signed)
[facility] Discharge Instructions for Patients Receiving Chemotherapy   Beginning January 23rd 2017 lab work for the Cancer Center will be done in the  Main lab at  on 1st floor. If you have a lab appointment with the Cancer Center please come in thru the  Main Entrance and check in at the main information desk   Today you received the following chemotherapy agents Herceptin. If you develop nausea and vomiting, or diarrhea that is not controlled by your medication, call the clinic.  The clinic phone number is [phone]. Office hours are Monday-Friday 8:30am-5:00pm.  BELOW ARE SYMPTOMS THAT SHOULD BE REPORTED IMMEDIATELY:  *FEVER GREATER THAN 101.0 F  *CHILLS WITH OR WITHOUT FEVER  NAUSEA AND VOMITING THAT IS NOT CONTROLLED WITH YOUR NAUSEA MEDICATION  *UNUSUAL SHORTNESS OF BREATH  *UNUSUAL BRUISING OR BLEEDING  TENDERNESS IN MOUTH AND THROAT WITH OR WITHOUT PRESENCE OF ULCERS  *URINARY PROBLEMS  *BOWEL PROBLEMS  UNUSUAL RASH Items with * indicate a potential emergency and should be followed up as soon as possible. If you have an emergency after office hours please contact your primary care physician or go to the nearest emergency department.  Please call the clinic during office hours if you have any questions or concerns.   You may also contact the Patient Navigator at [phone] should you have any questions or need assistance in obtaining follow up care.      Resources For Cancer Patients and their Caregivers ? American Cancer Society: Can assist with transportation, wigs, general needs, runs Look Good Feel Better.        1-888-227-6333 ? Cancer Care: Provides financial assistance, online support groups, medication/co-pay assistance.  1-800-813-HOPE (4673) ? [facility] Assists [place] cancer patients and their families through emotional , educational and financial support.   336-427-4357 ? [place] DSS Where to apply for food stamps, Medicaid and utility assistance. 336-342-1394 ? [facility]: Transportation to medical appointments. 336-347-2287 ? Social Security Administration: May apply for disability if have a Stage IV cancer. 336-342-7796 1-800-772-1213 ? [place] Aging, Disability and Transit Services: Assists with nutrition, care and transit needs. 336-349-2343         

## 2016-07-28 ENCOUNTER — Inpatient Hospital Stay (HOSPITAL_COMMUNITY): Admission: RE | Admit: 2016-07-28 | Payer: BLUE CROSS/BLUE SHIELD | Source: Ambulatory Visit

## 2016-08-07 ENCOUNTER — Encounter (HOSPITAL_COMMUNITY): Payer: Self-pay | Admitting: Adult Health

## 2016-08-07 ENCOUNTER — Encounter (HOSPITAL_COMMUNITY): Payer: BLUE CROSS/BLUE SHIELD | Attending: Adult Health | Admitting: Adult Health

## 2016-08-07 ENCOUNTER — Encounter (HOSPITAL_BASED_OUTPATIENT_CLINIC_OR_DEPARTMENT_OTHER): Payer: BLUE CROSS/BLUE SHIELD

## 2016-08-07 VITALS — BP 110/60 | HR 75 | Temp 97.7°F | Resp 18 | Wt 159.8 lb

## 2016-08-07 DIAGNOSIS — Z9012 Acquired absence of left breast and nipple: Secondary | ICD-10-CM | POA: Insufficient documentation

## 2016-08-07 DIAGNOSIS — G629 Polyneuropathy, unspecified: Secondary | ICD-10-CM | POA: Diagnosis not present

## 2016-08-07 DIAGNOSIS — C50919 Malignant neoplasm of unspecified site of unspecified female breast: Secondary | ICD-10-CM

## 2016-08-07 DIAGNOSIS — M5030 Other cervical disc degeneration, unspecified cervical region: Secondary | ICD-10-CM | POA: Diagnosis not present

## 2016-08-07 DIAGNOSIS — M5136 Other intervertebral disc degeneration, lumbar region: Secondary | ICD-10-CM | POA: Diagnosis not present

## 2016-08-07 DIAGNOSIS — Z171 Estrogen receptor negative status [ER-]: Secondary | ICD-10-CM

## 2016-08-07 DIAGNOSIS — F419 Anxiety disorder, unspecified: Secondary | ICD-10-CM | POA: Diagnosis not present

## 2016-08-07 DIAGNOSIS — C50412 Malignant neoplasm of upper-outer quadrant of left female breast: Secondary | ICD-10-CM

## 2016-08-07 DIAGNOSIS — G609 Hereditary and idiopathic neuropathy, unspecified: Secondary | ICD-10-CM | POA: Diagnosis not present

## 2016-08-07 DIAGNOSIS — Z803 Family history of malignant neoplasm of breast: Secondary | ICD-10-CM | POA: Diagnosis not present

## 2016-08-07 DIAGNOSIS — I340 Nonrheumatic mitral (valve) insufficiency: Secondary | ICD-10-CM | POA: Diagnosis not present

## 2016-08-07 DIAGNOSIS — Z5112 Encounter for antineoplastic immunotherapy: Secondary | ICD-10-CM | POA: Diagnosis not present

## 2016-08-07 DIAGNOSIS — Z170 Estrogen receptor positive status [ER+]: Principal | ICD-10-CM

## 2016-08-07 DIAGNOSIS — C50912 Malignant neoplasm of unspecified site of left female breast: Secondary | ICD-10-CM | POA: Insufficient documentation

## 2016-08-07 DIAGNOSIS — K219 Gastro-esophageal reflux disease without esophagitis: Secondary | ICD-10-CM | POA: Insufficient documentation

## 2016-08-07 DIAGNOSIS — S025XXA Fracture of tooth (traumatic), initial encounter for closed fracture: Secondary | ICD-10-CM | POA: Insufficient documentation

## 2016-08-07 DIAGNOSIS — M791 Myalgia: Secondary | ICD-10-CM | POA: Insufficient documentation

## 2016-08-07 DIAGNOSIS — Z1231 Encounter for screening mammogram for malignant neoplasm of breast: Secondary | ICD-10-CM

## 2016-08-07 DIAGNOSIS — X58XXXA Exposure to other specified factors, initial encounter: Secondary | ICD-10-CM | POA: Diagnosis not present

## 2016-08-07 DIAGNOSIS — Z5111 Encounter for antineoplastic chemotherapy: Secondary | ICD-10-CM

## 2016-08-07 DIAGNOSIS — F329 Major depressive disorder, single episode, unspecified: Secondary | ICD-10-CM | POA: Diagnosis not present

## 2016-08-07 LAB — CBC WITH DIFFERENTIAL/PLATELET
Basophils Absolute: 0 10*3/uL (ref 0.0–0.1)
Basophils Relative: 0 %
EOS PCT: 8 %
Eosinophils Absolute: 0.4 10*3/uL (ref 0.0–0.7)
HCT: 40.7 % (ref 36.0–46.0)
Hemoglobin: 13.6 g/dL (ref 12.0–15.0)
LYMPHS ABS: 2.5 10*3/uL (ref 0.7–4.0)
LYMPHS PCT: 54 %
MCH: 33 pg (ref 26.0–34.0)
MCHC: 33.4 g/dL (ref 30.0–36.0)
MCV: 98.8 fL (ref 78.0–100.0)
MONO ABS: 0.3 10*3/uL (ref 0.1–1.0)
MONOS PCT: 6 %
Neutro Abs: 1.5 10*3/uL — ABNORMAL LOW (ref 1.7–7.7)
Neutrophils Relative %: 32 %
PLATELETS: 202 10*3/uL (ref 150–400)
RBC: 4.12 MIL/uL (ref 3.87–5.11)
RDW: 12.3 % (ref 11.5–15.5)
WBC: 4.7 10*3/uL (ref 4.0–10.5)

## 2016-08-07 LAB — COMPREHENSIVE METABOLIC PANEL
ALBUMIN: 3.8 g/dL (ref 3.5–5.0)
ALT: 20 U/L (ref 14–54)
AST: 27 U/L (ref 15–41)
Alkaline Phosphatase: 63 U/L (ref 38–126)
Anion gap: 7 (ref 5–15)
BUN: 15 mg/dL (ref 6–20)
CHLORIDE: 104 mmol/L (ref 101–111)
CO2: 25 mmol/L (ref 22–32)
Calcium: 9 mg/dL (ref 8.9–10.3)
Creatinine, Ser: 0.75 mg/dL (ref 0.44–1.00)
GFR calc Af Amer: >60 mL/min (ref >60)
GFR calc non Af Amer: >60 mL/min (ref >60)
GLUCOSE: 93 mg/dL (ref 65–99)
Potassium: 4 mmol/L (ref 3.5–5.1)
SODIUM: 136 mmol/L (ref 135–145)
Total Bilirubin: 0.5 mg/dL (ref 0.3–1.2)
Total Protein: 6.9 g/dL (ref 6.5–8.1)

## 2016-08-07 MED ORDER — SODIUM CHLORIDE 0.9 % IV SOLN
Freq: Once | INTRAVENOUS | Status: AC
  Administered 2016-08-07: 10:00:00 via INTRAVENOUS

## 2016-08-07 MED ORDER — ACETAMINOPHEN 325 MG PO TABS
ORAL_TABLET | ORAL | Status: AC
  Filled 2016-08-07: qty 2

## 2016-08-07 MED ORDER — TRASTUZUMAB CHEMO 150 MG IV SOLR
6.0000 mg/kg | Freq: Once | INTRAVENOUS | Status: AC
  Administered 2016-08-07: 441 mg via INTRAVENOUS
  Filled 2016-08-07: qty 21

## 2016-08-07 MED ORDER — DIPHENHYDRAMINE HCL 25 MG PO CAPS
50.0000 mg | ORAL_CAPSULE | Freq: Once | ORAL | Status: AC
  Administered 2016-08-07: 50 mg via ORAL

## 2016-08-07 MED ORDER — DIPHENHYDRAMINE HCL 25 MG PO CAPS
ORAL_CAPSULE | ORAL | Status: AC
  Filled 2016-08-07: qty 2

## 2016-08-07 MED ORDER — SODIUM CHLORIDE 0.9% FLUSH
10.0000 mL | INTRAVENOUS | Status: DC | PRN
  Administered 2016-08-07: 10 mL
  Filled 2016-08-07: qty 10

## 2016-08-07 MED ORDER — HEPARIN SOD (PORK) LOCK FLUSH 100 UNIT/ML IV SOLN
500.0000 [IU] | Freq: Once | INTRAVENOUS | Status: AC | PRN
  Administered 2016-08-07: 500 [IU]
  Filled 2016-08-07: qty 5

## 2016-08-07 MED ORDER — ACETAMINOPHEN 325 MG PO TABS
650.0000 mg | ORAL_TABLET | Freq: Once | ORAL | Status: AC
  Administered 2016-08-07: 650 mg via ORAL

## 2016-08-07 NOTE — Progress Notes (Signed)
[name] tolerated Herceptin infusion well without complaints or incident. Labs reviewed prior to administering chemotherapy. VSS upon discharge. Pt discharged self ambulatory in satisfactory condition accompanied by her husband

## 2016-08-07 NOTE — Progress Notes (Signed)
[address] [place], [place] [address]   CLINIC:  Medical Oncology/Hematology  PCP:  Purvis Kilts, [place] [address] [phone]   REASON FOR VISIT:  Follow-up for Stage IA left breast invasive ductal carcinoma; ER-/PR-/HER2+  CURRENT THERAPY: Maintenance Herceptin every 21 days through 01/2017   BRIEF ONCOLOGIC HISTORY:    Breast cancer of upper-outer quadrant of left female breast ([place])   11/07/1999 Surgery    Lumpectomy/sentinel LN biopsy invasive ductal carcinoma and DCIS at [facility], 0/6 sentinel LN      11/10/1999 - 01/08/2005 Anti-estrogen oral therapy    Tamoxifen 20 mg daily      11/24/1999 - 01/02/2000 Radiation Therapy         12/18/2015 Mammogram    2D digital screening bilateral mammogram with CAD and adjunct TOMO, in the L breast a possible mass warrants further evaluation. In R breast no findings suspicious for malignancy      12/24/2015 Pathology Results    Invasive ductal carcinoma Grade 3 Left core needle biopsy 1:30 o clock ER- PR- Ki67 70%, HER 2 positive      12/24/2015 Imaging    L breast Ultrasound, notes palpable area of concern demonstrates a hypoechoic irregular mass at 1:30, 7 cm from the nipple measuring 1.3 x 0.9 x 1.4 cm. No evidence of L axillary LAD      01/07/2016 Procedure    Left simple mastectomy by Dr. Georgette Dover      01/08/2016 Pathology Results    Breast, simple mastectomy, Left - INVASIVE DUCTAL CARCINOMA, GRADE III/III, SPANNING 1.4 CM. - THE SURGICAL RESECTION MARGINS ARE NEGATIVE FOR CARCINOMA.      01/23/2016 Genetic Testing    Patient refused genetic testing at time of consultation with Roma Kayser.  "Despite our recommendation, Ms. Port did not wish to pursue genetic testing at today's visit."      01/28/2016 Echocardiogram    Left ventricle: The cavity size was normal. Wall thickness was normal. Systolic function was normal. The estimated  ejection fraction was in the range of 55% to 60%. Wall motion was normal; there were no regional wall motion abnormalities. Doppler parameters are consistent with abnormal left ventricular relaxation (grade 1 diastolic dysfunction).      01/31/2016 - 05/15/2016 Chemotherapy    The patient had palonosetron (ALOXI) injection 0.25 mg, 0.25 mg, Intravenous,  Once, 1 of 6 cycles  pegfilgrastim (NEULASTA ONPRO [place]) injection 6 mg, 6 mg, Subcutaneous, Once, 1 of 6 cycles  CARBOplatin (PARAPLATIN) 560 mg in sodium chloride 0.9 % 250 mL chemo infusion, 560 mg (100 % of original dose 558.5 mg), Intravenous,  Once, 1 of 6 cycles Dose modification:   (original dose 558.5 mg, Cycle 1),   (original dose 558.5 mg, Cycle 2)  DOCEtaxel (TAXOTERE) 140 mg in dextrose 5 % 250 mL chemo infusion, 75 mg/m2 = 140 mg, Intravenous,  Once, 1 of 6 cycles  for chemotherapy treatment.        01/31/2016 -  Antibody Plan    Herceptin x 52 weeks        INTERVAL HISTORY:  [name] is here for follow-up and consideration for next cycle of maintenance Herceptin therapy.   Since her last visit to the cancer center she was evaluated by Dr. Aundra Dubin with cardiology for her chest pain/palpitations on 07/08/16; they are making arrangements for her to have ETT-Cardiolite evaluation for risk stratification in [place].  Ms. Luckman tells me that she  felt dizzy on the day that her procedure was scheduled and she cancelled the test. She is not planning to call to reschedule unless she has more chest pain.  She denies any chest pain episodes recently. She is already scheduled for her next ECHO on 08/24/16 while on maintenance Herceptin therapy.   Her energy levels and appetite are back to 100%. She tells me that her right arm is sore because she has been busy painting and staining the wood for their new house near [place], [place].  She has very mild peripheral neuropathy to her fingertips and toes, but this is continuing to  improve.  She has chronic sleep issues. Otherwise, she is largely without complaints today.   She is scheduled to have her mammogram in 12/2016 at [facility]; however she would like this appt changed to the [facility] in [place] because she has been seeing them for years. She is also requesting a letter from [place] for her dentist so that she may have a broken tooth repaired. She also has some lower frontal gum pain; she uses salt water rinses periodically. I recommended she discuss treatment strategies for this with her dentist as they are unlikely to be related to Herceptin.     REVIEW OF SYSTEMS:  Review of Systems  Constitutional: Negative for appetite change, chills, fatigue and fever.  HENT:         Dental issues   Eyes: Negative.   Respiratory: Negative.  Negative for cough and shortness of breath.   Cardiovascular: Negative for chest pain and palpitations.  Gastrointestinal: Negative.  Negative for blood in stool, constipation, diarrhea, nausea and vomiting.  Endocrine: Negative.   Genitourinary: Negative.  Negative for dysuria, hematuria and vaginal bleeding.   Skin: Negative.  Negative for rash.  Neurological: Positive for dizziness (chronic issue; none at present ).  Hematological: Negative.   Psychiatric/Behavioral: Positive for sleep disturbance (chronic ).     PAST MEDICAL/SURGICAL HISTORY:  Past Medical History:  Diagnosis Date  . Anxiety   . Breast cancer ([place])   . Breast cancer of upper-outer quadrant of left female breast ([place]) 01/07/2016  . Complication of anesthesia   . DDD (degenerative disc disease), cervical   . DDD (degenerative disc disease), lumbar   . Depression   . Dysrhythmia    last cardiology ov 06-13-2013 in [facility], takes metoprolol for HR  . Family history of breast cancer   . Family history of colon cancer   . Fibromyalgia   . GERD (gastroesophageal reflux disease)   . Hematuria   . History of echocardiogram 2015   mild mital insufficiency,  otherwise normal  . Leaky heart valve   . Multiple thyroid nodules   . PAC (premature atrial contraction)   . Palpitations   . PONV (postoperative nausea and vomiting)   . SOB (shortness of breath)   . Tachycardia    Past Surgical History:  Procedure Laterality Date  . BREAST LUMPECTOMY    . LAPAROSCOPIC APPENDECTOMY N/A 11/10/2012   Procedure: APPENDECTOMY LAPAROSCOPIC;  Surgeon: Jamesetta So, MD;  Location: [facility];  Service: General;  Laterality: N/A;  . MASTECTOMY    . MASTECTOMY W/ SENTINEL NODE BIOPSY Left 01/07/2016  . MASTECTOMY W/ SENTINEL NODE BIOPSY Left 01/07/2016   Procedure: LEFT TOTAL MASTECTOMY WITH LEFT AXILLARY SENTINEL LYMPH NODE BIOPSY;  Surgeon: Donnie Mesa, MD;  Location: [facility];  Service: General;  Laterality: Left;  . PORTACATH PLACEMENT Right 01/07/2016   IJ  . PORTACATH PLACEMENT  Right 01/07/2016   Procedure: INSERTION PORT-A-CATH RIGHT INTERNAL JUGULAR WITH ULTRASOUND;  Surgeon: Donnie Mesa, MD;  Location: [facility];  Service: General;  Laterality: Right;     SOCIAL HISTORY:  Social History   Social History  . Marital status: Married    Spouse name: N/A  . Number of children: 2  . Years of education: N/A   Occupational History  . Not on file.   Social History Main Topics  . Smoking status: Never Smoker  . Smokeless tobacco: Never Used  . Alcohol use No  . Drug use: No  . Sexual activity: Yes     Comment: married   Other Topics Concern  . Not on file   Social History Narrative  . No narrative on file    FAMILY HISTORY:  Family History  Problem Relation Age of Onset  . Cancer Mother     colon  . Diabetes Father   . Diabetes Sister   . Alcohol abuse Brother   . Cancer Brother 46    lung cancer  . Cancer Brother     penile  . Breast cancer Maternal Aunt   . Cancer Other     unknown form of cancer    CURRENT MEDICATIONS:  Outpatient Encounter Prescriptions as of 08/07/2016  Medication Sig Note  . acetaminophen (TYLENOL) 500 MG  tablet Take 500 mg by mouth every 6 (six) hours as needed for mild pain.   Marland Kitchen ALPRAZolam (XANAX) 0.5 MG tablet Take 1 tablet (0.5 mg total) by mouth 3 (three) times daily as needed for anxiety.   Marland Kitchen antiseptic oral rinse (BIOTENE) LIQD 15 mLs by Mouth Rinse route as needed for dry mouth.   . CARBOPLATIN IV Inject into the vein. Every 3 weeks   . Cholecalciferol (VITAMIN D3) 1000 units CAPS Take 1,000 Units by mouth every morning.   Marland Kitchen dexamethasone (DECADRON) 4 MG tablet Take 2 tablets (8 mg total) by mouth 2 (two) times daily. Start the day before Taxotere. Then again the day after chemo for 3 days.   . DOCEtaxel (TAXOTERE IV) Inject into the vein. Every 3 weeks   . famotidine (PEPCID) 20 MG tablet Take 1 tablet (20 mg total) by mouth 2 (two) times daily.   . meclizine (ANTIVERT) 25 MG tablet Take 1 tablet (25 mg total) by mouth 3 (three) times daily as needed for dizziness.   . metoprolol tartrate (LOPRESSOR) 25 MG tablet Take 12.5-25 mg by mouth 2 (two) times daily. 25 mg in the morning. 12.5 mg in the evening, 01/07/2016: 330 am  . omeprazole (PRILOSEC) 40 MG capsule take 1 capsule twice a day with food   . oxyCODONE (OXY IR/ROXICODONE) 5 MG immediate release tablet Take 1-2 tablets (5-10 mg total) by mouth every 4 (four) hours as needed for moderate pain.   Marland Kitchen Pegfilgrastim (NEULASTA ONPRO [place]) Inject into the skin. Every 3 weeks   . Polyethyl Glycol-Propyl Glycol (SYSTANE) 0.4-0.3 % SOLN Apply 1 drop to eye at bedtime.   . Trastuzumab (HERCEPTIN IV) Inject into the vein. Every 3 weeks   . triamcinolone ointment (KENALOG) 0.5 % Apply 1 application topically 2 (two) times daily.    Facility-Administered Encounter Medications as of 08/07/2016  Medication  . sodium chloride flush (NS) 0.9 % injection 10 mL    ALLERGIES:  Allergies  Allergen Reactions  . Latex Itching  . Naprosyn [Naproxen] Other (See Comments)    UNSPECIFIED REACTION   . Other     USE PAPER  TAPE ONLY  . Codeine Nausea And  Vomiting     PHYSICAL EXAM:  ECOG Performance status: 1 - Symptomatic, but largely independent      Physical Exam  Constitutional: She is oriented to person, place, and time and well-developed, well-nourished, and in no distress.  Patient seen and examined in treatment chair.   HENT:  Head: Normocephalic.  -Small area of ?leukoplakia to lower jaw/gum area that is sore per patient.  -Broken tooth noted to upper left incisor  Eyes: Conjunctivae are normal. No scleral icterus.  Neck: Normal range of motion. Neck supple.  Cardiovascular: Normal rate, regular rhythm and normal heart sounds.   Pulmonary/Chest: Effort normal and breath sounds normal. No respiratory distress. She has no wheezes.  Abdominal: Soft. Bowel sounds are normal. There is no tenderness.  Musculoskeletal: Normal range of motion. She exhibits no edema.  Lymphadenopathy:    She has no cervical adenopathy.       Right: No supraclavicular adenopathy present.       Left: No supraclavicular adenopathy present.  Neurological: She is alert and oriented to person, place, and time. No cranial nerve deficit. Gait normal.  Skin: Skin is warm and dry. No rash noted. She is not diaphoretic.  Psychiatric: Mood, memory, affect and judgment normal.  Nursing note and vitals reviewed.    LABORATORY DATA:  I have reviewed the labs as listed.  CBC    Component Value Date/Time   WBC 4.7 08/07/2016 0921   RBC 4.12 08/07/2016 0921   HGB 13.6 08/07/2016 0921   HCT 40.7 08/07/2016 0921   PLT 202 08/07/2016 0921   MCV 98.8 08/07/2016 0921   MCH 33.0 08/07/2016 0921   MCHC 33.4 08/07/2016 0921   RDW 12.3 08/07/2016 0921   LYMPHSABS 2.5 08/07/2016 0921   MONOABS 0.3 08/07/2016 0921   EOSABS 0.4 08/07/2016 0921   BASOSABS 0.0 08/07/2016 0921   CMP Latest Ref Rng & Units 08/07/2016 06/26/2016 06/05/2016  Glucose 65 - 99 mg/dL 93 97 103(H)  BUN 6 - 20 mg/dL _0 Creatinine 0.44 - 1.00 mg/dL 0.75 0.66 0.63  Sodium 135 - 145  mmol/L 136 137 136  Potassium 3.5 - 5.1 mmol/L 4.0 4.0 3.9  Chloride 101 - 111 mmol/L 104 106 105  CO2 22 - 32 mmol/L _1 Calcium 8.9 - 10.3 mg/dL 9.0 8.9 8.9  Total Protein 6.5 - 8.1 g/dL 6.9 6.8 6.2(L)  Total Bilirubin 0.3 - 1.2 mg/dL 0.5 0.3 0.7  Alkaline Phos 38 - 126 U/L 63 59 48  AST 15 - 41 U/L 27 32 22  ALT 14 - 54 U/L _2 PENDING LABS:    DIAGNOSTIC IMAGING:  EKG: 06/26/16   Most recent ECHO:     PATHOLOGY:  Surgical path: 01/07/16     ASSESSMENT & PLAN:   Stage IA left breast invasive ductal carcinoma, ER-/PR-/HER2+: -s/p mastectomy and Taxotere/Carbo/Herceptin x 6 cycles; now on maintenance Herceptin every 3 weeks to total 1 year of therapy. She will complete Herceptin through 01/2017.   -She expressed confusion today regarding her treatment plan; she states, "I thought I only had to have 6 cycles of chemo and then 6 cycles of Herceptin."  I reviewed NCCN Guidelines with her, as well as Dr. Donald Pore recommendations, that she proceed with Herceptin x 52 weeks. I explained the rationale for this treatment plan. She is discouraged but expressed understanding.  -Labs reviewed and are adequate for Herceptin today.  -  Next ECHO due on 08/24/16.  -Right breast unilateral mammogram due in 12/2016; orders changed to be done at the [facility] per patient request.   -Return to cancer center every 3 weeks for continued Herceptin. Follow-up visit in 6 weeks.   Dental issues:  -Patient with ?leukoplakia to left lower gums; also with evidence of broken tooth of the left upper incisor. She is scheduled to have possible dental extraction vs repair of broken tooth with her dentist and is requesting a letter stating that this procedure is safe from an oncologic standpoint. Letter provided to patient today stating as such.   Cardiology issues:  -Maintain follow-up with Cardiology specialist as directed.  -She missed her ETT-Cardiolite procedure d/t  complaints of dizziness. Strongly recommended she call to reschedule this test, as it will help cardiologist evaluate for at-risk heart disease. She states that she may wait until she is finished with the Herceptin or if she has another chest pain episode. Again, recommended that she call to have the test scheduled as soon as she is able.   Peripheral neuropathy likely secondary to chemotherapy: -Reports that her symptoms are symptoms are continuing to improve; will continue to monitor.     Dispo:  -Next ECHO due 08/24/16.  -Return every 3 weeks for maintenance Herceptin.  -Return for follow-up visit in 6 weeks.    All questions were answered to patient's stated satisfaction. Encouraged patient to call with any new concerns or questions before her next visit to the cancer center and we can certain see her sooner, if needed.    Plan of care discussed with Dr. Oliva Bustard, who agrees with the above aforementioned.    Orders placed this encounter:  Orders Placed This Encounter  Procedures  . MM SCREENING BREAST TOMO UNI R      Mike Craze, NP [place] [phone]

## 2016-08-07 NOTE — Patient Instructions (Addendum)
[place] at [facility] Discharge Instructions  RECOMMENDATIONS MADE BY THE CONSULTANT AND ANY TEST RESULTS WILL BE SENT TO YOUR REFERRING PHYSICIAN.  You were seen today by Mike Craze NP. Mammogram in [place] at breast center. Return in 3 weeks for treatment. Return in 6 weeks for follow up and treatment.    Thank you for choosing [place] at [facility] to provide your oncology and hematology care.  To afford each patient quality time with our provider, please arrive at least 15 minutes before your scheduled appointment time.    If you have a lab appointment with the [facility] please come in thru the  Main Entrance and check in at the main information desk  You need to re-schedule your appointment should you arrive 10 or more minutes late.  We strive to give you quality time with our providers, and arriving late affects you and other patients whose appointments are after yours.  Also, if you no show three or more times for appointments you may be dismissed from the clinic at the providers discretion.     Again, thank you for choosing [facility].  Our hope is that these requests will decrease the amount of time that you wait before being seen by our physicians.       _____________________________________________________________  Should you have questions after your visit to [facility], please contact our office at [phone] between the hours of 8:30 a.m. and 4:30 p.m.  Voicemails left after 4:30 p.m. will not be returned until the following business day.  For prescription refill requests, have your pharmacy contact our office.       Resources For Cancer Patients and their Caregivers ? American Cancer Society: Can assist with transportation, wigs, general needs, runs Look Good Feel Better.        332-871-8770 ? Cancer Care: Provides financial assistance, online support groups,  medication/co-pay assistance.  1-800-813-HOPE 9785358459) ? [place] Assists [place] cancer patients and their families through emotional , educational and financial support.  (414)204-7629 ? [place] DSS Where to apply for food stamps, Medicaid and utility assistance. 803 541 3576 ? [employer]: Transportation to medical appointments. 223-789-5989 ? Social Security Administration: May apply for disability if have a Stage IV cancer. 872-116-0216 316-772-9255 ? [employer], Disability and Transit Services: Assists with nutrition, care and transit needs. [place] Support Programs: @10RELATIVEDAYS @ > Cancer Support Group  2nd Tuesday of the month 1pm-2pm, Journey Room  > Creative Journey  3rd Tuesday of the month 1130am-1pm, Journey Room  > Look Good Feel Better  1st Wednesday of the month 10am-12 noon, Journey Room (Call [place] to register [phone])

## 2016-08-07 NOTE — Patient Instructions (Signed)
[facility] Discharge Instructions for Patients Receiving Chemotherapy   Beginning January 23rd 2017 lab work for the Cancer Center will be done in the  Main lab at [place] on 1st floor. If you have a lab appointment with the Cancer Center please come in thru the  Main Entrance and check in at the main information desk   Today you received the following chemotherapy agents Herceptin. Follow-up as scheduled. Call clinic for any questions or concerns  To help prevent nausea and vomiting after your treatment, we encourage you to take your nausea medication.   If you develop nausea and vomiting, or diarrhea that is not controlled by your medication, call the clinic.  The clinic phone number is [phone]. Office hours are Monday-Friday 8:30am-5:00pm.  BELOW ARE SYMPTOMS THAT SHOULD BE REPORTED IMMEDIATELY:  *FEVER GREATER THAN 101.0 F  *CHILLS WITH OR WITHOUT FEVER  NAUSEA AND VOMITING THAT IS NOT CONTROLLED WITH YOUR NAUSEA MEDICATION  *UNUSUAL SHORTNESS OF BREATH  *UNUSUAL BRUISING OR BLEEDING  TENDERNESS IN MOUTH AND THROAT WITH OR WITHOUT PRESENCE OF ULCERS  *URINARY PROBLEMS  *BOWEL PROBLEMS  UNUSUAL RASH Items with * indicate a potential emergency and should be followed up as soon as possible. If you have an emergency after office hours please contact your primary care physician or go to the nearest emergency department.  Please call the clinic during office hours if you have any questions or concerns.   You may also contact the Patient Navigator at [phone] should you have any questions or need assistance in obtaining follow up care.      Resources For Cancer Patients and their Caregivers ? American Cancer Society: Can assist with transportation, wigs, general needs, runs Look Good Feel Better.        1-888-227-6333 ? Cancer Care: Provides financial assistance, online support groups, medication/co-pay assistance.  1-800-813-HOPE  (4673) ? [facility] Assists [place] cancer patients and their families through emotional , educational and financial support.  336-427-4357 ? [place] DSS Where to apply for food stamps, Medicaid and utility assistance. 336-342-1394 ? [employer]: Transportation to medical appointments. 336-347-2287 ? Social Security Administration: May apply for disability if have a Stage IV cancer. 336-342-7796 1-800-772-1213 ? [place] Aging, Disability and Transit Services: Assists with nutrition, care and transit needs. 336-349-2343         

## 2016-08-07 NOTE — Progress Notes (Signed)
    August 07, 2016    To Whom It May Concern:   [name] is a current patient at [facility]. She is receiving maintenance therapy with Herceptin for breast cancer. This therapy is not likely to interfere with her blood counts and it is safe for her to have dental care/procedures during this time.  She is also not on bisphosphonate therapy.    Please feel free to contact our office if you have any further questions or concerns.    Hulda Marin, NP [place] [phone]

## 2016-08-10 ENCOUNTER — Encounter (HOSPITAL_COMMUNITY): Payer: Self-pay | Admitting: *Deleted

## 2016-08-17 NOTE — Telephone Encounter (Signed)
Encounter open in error 

## 2016-08-24 ENCOUNTER — Ambulatory Visit (HOSPITAL_COMMUNITY)
Admission: RE | Admit: 2016-08-24 | Discharge: 2016-08-24 | Disposition: A | Discharge status: Home / Self Care | Payer: BLUE CROSS/BLUE SHIELD | Source: Ambulatory Visit | Attending: Adult Health | Admitting: Adult Health

## 2016-08-24 DIAGNOSIS — Z170 Estrogen receptor positive status [ER+]: Secondary | ICD-10-CM | POA: Insufficient documentation

## 2016-08-24 DIAGNOSIS — I071 Rheumatic tricuspid insufficiency: Secondary | ICD-10-CM | POA: Insufficient documentation

## 2016-08-24 DIAGNOSIS — R002 Palpitations: Secondary | ICD-10-CM | POA: Diagnosis not present

## 2016-08-24 DIAGNOSIS — Z9012 Acquired absence of left breast and nipple: Secondary | ICD-10-CM | POA: Diagnosis not present

## 2016-08-24 DIAGNOSIS — C50412 Malignant neoplasm of upper-outer quadrant of left female breast: Secondary | ICD-10-CM | POA: Diagnosis not present

## 2016-08-24 DIAGNOSIS — Z171 Estrogen receptor negative status [ER-]: Secondary | ICD-10-CM

## 2016-08-24 DIAGNOSIS — Z5111 Encounter for antineoplastic chemotherapy: Secondary | ICD-10-CM

## 2016-08-24 NOTE — Progress Notes (Signed)
*  PRELIMINARY RESULTS* Echocardiogram 2D Echocardiogram has been performed.  Leavy Cella 08/24/2016, 11:18 AM

## 2016-08-28 ENCOUNTER — Encounter (HOSPITAL_COMMUNITY): Payer: Self-pay

## 2016-08-28 ENCOUNTER — Encounter (HOSPITAL_BASED_OUTPATIENT_CLINIC_OR_DEPARTMENT_OTHER): Payer: BLUE CROSS/BLUE SHIELD

## 2016-08-28 VITALS — BP 102/59 | HR 69 | Temp 98.0°F | Resp 18 | Wt 158.2 lb

## 2016-08-28 DIAGNOSIS — Z5112 Encounter for antineoplastic immunotherapy: Secondary | ICD-10-CM | POA: Diagnosis not present

## 2016-08-28 DIAGNOSIS — Z171 Estrogen receptor negative status [ER-]: Principal | ICD-10-CM

## 2016-08-28 DIAGNOSIS — C50412 Malignant neoplasm of upper-outer quadrant of left female breast: Secondary | ICD-10-CM

## 2016-08-28 MED ORDER — SODIUM CHLORIDE 0.9 % IV SOLN
Freq: Once | INTRAVENOUS | Status: AC
  Administered 2016-08-28: 10:00:00 via INTRAVENOUS

## 2016-08-28 MED ORDER — SODIUM CHLORIDE 0.9% FLUSH: 10.0000 mL | INTRAVENOUS | Status: DC | PRN

## 2016-08-28 MED ORDER — ACETAMINOPHEN 325 MG PO TABS: 650.0000 mg | ORAL_TABLET | Freq: Once | ORAL | Status: DC

## 2016-08-28 MED ORDER — DIPHENHYDRAMINE HCL 25 MG PO CAPS
ORAL_CAPSULE | ORAL | Status: AC
  Filled 2016-08-28: qty 2

## 2016-08-28 MED ORDER — TRASTUZUMAB CHEMO 150 MG IV SOLR
6.0000 mg/kg | Freq: Once | INTRAVENOUS | Status: AC
  Administered 2016-08-28: 441 mg via INTRAVENOUS
  Filled 2016-08-28: qty 21

## 2016-08-28 MED ORDER — HEPARIN SOD (PORK) LOCK FLUSH 100 UNIT/ML IV SOLN
500.0000 [IU] | Freq: Once | INTRAVENOUS | Status: AC | PRN
  Administered 2016-08-28: 500 [IU]
  Filled 2016-08-28: qty 5

## 2016-08-28 MED ORDER — DIPHENHYDRAMINE HCL 25 MG PO CAPS
50.0000 mg | ORAL_CAPSULE | Freq: Once | ORAL | Status: AC
  Administered 2016-08-28: 50 mg via ORAL

## 2016-08-28 NOTE — Progress Notes (Signed)
Tolerated infusion w/o adverse reaction.  Alert, in no distress.  VSS.  Discharged ambulatory in c/o spouse.  

## 2016-08-28 NOTE — Patient Instructions (Signed)
[facility] Discharge Instructions for Patients Receiving Chemotherapy   Beginning January 23rd 2017 lab work for the Cancer Center will be done in the  Main lab at [place] on 1st floor. If you have a lab appointment with the Cancer Center please come in thru the  Main Entrance and check in at the main information desk   Today you received the following chemotherapy agents Herceptin. If you develop nausea and vomiting, or diarrhea that is not controlled by your medication, call the clinic.  The clinic phone number is [phone]. Office hours are Monday-Friday 8:30am-5:00pm.  BELOW ARE SYMPTOMS THAT SHOULD BE REPORTED IMMEDIATELY:  *FEVER GREATER THAN 101.0 F  *CHILLS WITH OR WITHOUT FEVER  NAUSEA AND VOMITING THAT IS NOT CONTROLLED WITH YOUR NAUSEA MEDICATION  *UNUSUAL SHORTNESS OF BREATH  *UNUSUAL BRUISING OR BLEEDING  TENDERNESS IN MOUTH AND THROAT WITH OR WITHOUT PRESENCE OF ULCERS  *URINARY PROBLEMS  *BOWEL PROBLEMS  UNUSUAL RASH Items with * indicate a potential emergency and should be followed up as soon as possible. If you have an emergency after office hours please contact your primary care physician or go to the nearest emergency department.  Please call the clinic during office hours if you have any questions or concerns.   You may also contact the Patient Navigator at [phone] should you have any questions or need assistance in obtaining follow up care.      Resources For Cancer Patients and their Caregivers ? American Cancer Society: Can assist with transportation, wigs, general needs, runs Look Good Feel Better.        1-888-227-6333 ? Cancer Care: Provides financial assistance, online support groups, medication/co-pay assistance.  1-800-813-HOPE (4673) ? [facility] Assists [place] cancer patients and their families through emotional , educational and financial support.   336-427-4357 ? [place] DSS Where to apply for food stamps, Medicaid and utility assistance. 336-342-1394 ? [employer]: Transportation to medical appointments. 336-347-2287 ? Social Security Administration: May apply for disability if have a Stage IV cancer. 336-342-7796 1-800-772-1213 ? [place] Aging, Disability and Transit Services: Assists with nutrition, care and transit needs. 336-349-2343         

## 2016-09-17 NOTE — Progress Notes (Signed)
[name], [place] [address]   CLINIC:  Medical Oncology/Hematology  PCP:  Sharilyn Sites, [address] [place] [address] [phone]   REASON FOR VISIT:  Follow-up for Stage IA left breast invasive ductal carcinoma; ER-/PR-/HER2+  CURRENT THERAPY: Maintenance Herceptin every 21 days through 01/2017   BRIEF ONCOLOGIC HISTORY:    Breast cancer of upper-outer quadrant of left female breast ([place])   11/07/1999 Surgery    Lumpectomy/sentinel LN biopsy invasive ductal carcinoma and DCIS at [facility], 0/6 sentinel LN      11/10/1999 - 01/08/2005 Anti-estrogen oral therapy    Tamoxifen 20 mg daily      11/24/1999 - 01/02/2000 Radiation Therapy         12/18/2015 Mammogram    2D digital screening bilateral mammogram with CAD and adjunct TOMO, in the L breast a possible mass warrants further evaluation. In R breast no findings suspicious for malignancy      12/24/2015 Pathology Results    Invasive ductal carcinoma Grade 3 Left core needle biopsy 1:30 o clock ER- PR- Ki67 70%, HER 2 positive      12/24/2015 Imaging    L breast Ultrasound, notes palpable area of concern demonstrates a hypoechoic irregular mass at 1:30, 7 cm from the nipple measuring 1.3 x 0.9 x 1.4 cm. No evidence of L axillary LAD      01/07/2016 Procedure    Left simple mastectomy by Dr. Georgette Dover      01/08/2016 Pathology Results    Breast, simple mastectomy, Left - INVASIVE DUCTAL CARCINOMA, GRADE III/III, SPANNING 1.4 CM. - THE SURGICAL RESECTION MARGINS ARE NEGATIVE FOR CARCINOMA.      01/23/2016 Genetic Testing    Patient refused genetic testing at time of consultation with Roma Kayser.  "Despite our recommendation, [name] did not wish to pursue genetic testing at today's visit."      01/28/2016 Echocardiogram    Left ventricle: The cavity size was normal. Wall thickness was normal. Systolic function was normal. The estimated ejection fraction  was in the range of 55% to 60%. Wall motion was normal; there were no regional wall motion abnormalities. Doppler parameters are consistent with abnormal left ventricular relaxation (grade 1 diastolic dysfunction).      01/31/2016 - 05/15/2016 Chemotherapy    The patient had palonosetron (ALOXI) injection 0.25 mg, 0.25 mg, Intravenous,  Once, 1 of 6 cycles  pegfilgrastim (NEULASTA ONPRO KIT) injection 6 mg, 6 mg, Subcutaneous, Once, 1 of 6 cycles  CARBOplatin (PARAPLATIN) 560 mg in sodium chloride 0.9 % 250 mL chemo infusion, 560 mg (100 % of original dose 558.5 mg), Intravenous,  Once, 1 of 6 cycles Dose modification:   (original dose 558.5 mg, Cycle 1),   (original dose 558.5 mg, Cycle 2)  DOCEtaxel (TAXOTERE) 140 mg in dextrose 5 % 250 mL chemo infusion, 75 mg/m2 = 140 mg, Intravenous,  Once, 1 of 6 cycles  for chemotherapy treatment.        01/31/2016 -  Antibody Plan    Herceptin x 52 weeks      08/24/2016 Echocardiogram    Left ventricle: The cavity size was normal. Wall thickness was normal. Systolic function was normal. The estimated ejection fraction was in the range of 55% to 60%. Wall motion was normal; there were no regional wall motion abnormalities. Doppler parameters are consistent with abnormal left ventricular relaxation (grade 1 diastolic dysfunction).        INTERVAL HISTORY:  Ms.  [name] is here for follow-up and consideration for next cycle of maintenance Herceptin therapy.   She had ECHO on 08/24/16.    Overall, she tells me she feels well. Her energy levels and appetite are both 100%.  She has remained active and been working hard on their new home renovations. Denies any pain. Intermittent peripheral neuropathy to fingertips continues to improve since completing chemotherapy.    Endorses occasional dysuria, "but it usually goes away on its own within a day or two."  Does report that she has not been drinking enough water recently.  Denies  fever, chills, or hematuria.  Symptoms do not worsen, but recur several times per month.    Reports a new (L) forehead/scalp bump that started about 4-5 days ago. It is pruritic, but not painful. She is not sure what caused the bump, but is wondering what cream she should apply to the area to help with the itching.   Recently saw her dentist and he is referring her to oral surgeon because she needs multiple dental extractions (previously thought she would only need 1 tooth repaired).     Denies any chest pain episodes. Had recent ECHO in late 08/2016.  Otherwise, she is largely without other complaints today and feels ready for next cycle Herceptin maintenance therapy.     REVIEW OF SYSTEMS:  Review of Systems  Constitutional: Negative.  Negative for appetite change, chills, fatigue and fever.  HENT:  Negative.  Negative for lump/mass and nosebleeds.   Eyes: Negative.   Respiratory: Negative.  Negative for cough and shortness of breath.   Cardiovascular: Negative.  Negative for chest pain and leg swelling.  Gastrointestinal: Negative.  Negative for abdominal pain, blood in stool, constipation, diarrhea, nausea and vomiting.  Endocrine: Negative.   Genitourinary: Positive for dysuria. Negative for hematuria and vaginal bleeding.   Musculoskeletal: Negative.  Negative for arthralgias.  Skin: Positive for itching. Negative for rash.  Neurological: Positive for numbness. Negative for dizziness and headaches.  Hematological: Negative.  Negative for adenopathy. Does not bruise/bleed easily.  Psychiatric/Behavioral: Negative.  Negative for depression and sleep disturbance. The patient is not nervous/anxious.      PAST MEDICAL/SURGICAL HISTORY:  Past Medical History:  Diagnosis Date  . Anxiety   . Breast cancer ([place])   . Breast cancer of upper-outer quadrant of left female breast ([place]) 01/07/2016  . Complication of anesthesia   . DDD (degenerative disc disease), cervical   . DDD  (degenerative disc disease), lumbar   . Depression   . Dysrhythmia    last cardiology ov 06-13-2013 in [facility], takes metoprolol for HR  . Family history of breast cancer   . Family history of colon cancer   . Fibromyalgia   . GERD (gastroesophageal reflux disease)   . Hematuria   . History of echocardiogram 2015   mild mital insufficiency, otherwise normal  . Leaky heart valve   . Multiple thyroid nodules   . PAC (premature atrial contraction)   . Palpitations   . PONV (postoperative nausea and vomiting)   . SOB (shortness of breath)   . Tachycardia    Past Surgical History:  Procedure Laterality Date  . BREAST LUMPECTOMY    . LAPAROSCOPIC APPENDECTOMY N/A 11/10/2012   Procedure: APPENDECTOMY LAPAROSCOPIC;  Surgeon: Jamesetta So, MD;  Location: [facility];  Service: General;  Laterality: N/A;  . MASTECTOMY    . MASTECTOMY W/ SENTINEL NODE BIOPSY Left 01/07/2016  . MASTECTOMY W/ SENTINEL NODE BIOPSY Left 01/07/2016  Procedure: LEFT TOTAL MASTECTOMY WITH LEFT AXILLARY SENTINEL LYMPH NODE BIOPSY;  Surgeon: Donnie Mesa, MD;  Location: [facility];  Service: General;  Laterality: Left;  . PORTACATH PLACEMENT Right 01/07/2016   IJ  . PORTACATH PLACEMENT Right 01/07/2016   Procedure: INSERTION PORT-A-CATH RIGHT INTERNAL JUGULAR WITH ULTRASOUND;  Surgeon: Donnie Mesa, MD;  Location: [facility];  Service: General;  Laterality: Right;     SOCIAL HISTORY:  Social History   Social History  . Marital status: Married    Spouse name: N/A  . Number of children: 2  . Years of education: N/A   Occupational History  . Not on file.   Social History Main Topics  . Smoking status: Never Smoker  . Smokeless tobacco: Never Used  . Alcohol use No  . Drug use: No  . Sexual activity: Yes     Comment: married   Other Topics Concern  . Not on file   Social History Narrative  . No narrative on file    FAMILY HISTORY:  Family History  Problem Relation Age of Onset  . Cancer Mother        colon  .  Diabetes Father   . Diabetes Sister   . Alcohol abuse Brother   . Cancer Brother 38       lung cancer  . Cancer Brother        penile  . Breast cancer Maternal Aunt   . Cancer Other        unknown form of cancer    CURRENT MEDICATIONS:  Outpatient Encounter Prescriptions as of 09/18/2016  Medication Sig Note  . acetaminophen (TYLENOL) 500 MG tablet Take 500 mg by mouth every 6 (six) hours as needed for mild pain.   Marland Kitchen ALPRAZolam (XANAX) 0.5 MG tablet Take 1 tablet (0.5 mg total) by mouth 3 (three) times daily as needed for anxiety.   Marland Kitchen antiseptic oral rinse (BIOTENE) LIQD 15 mLs by Mouth Rinse route as needed for dry mouth.   . Cholecalciferol (VITAMIN D3) 1000 units CAPS Take 1,000 Units by mouth every morning.   . famotidine (PEPCID) 20 MG tablet Take 1 tablet (20 mg total) by mouth 2 (two) times daily.   . meclizine (ANTIVERT) 25 MG tablet Take 1 tablet (25 mg total) by mouth 3 (three) times daily as needed for dizziness.   . metoprolol tartrate (LOPRESSOR) 25 MG tablet Take 12.5-25 mg by mouth 2 (two) times daily. 25 mg in the morning. 12.5 mg in the evening, 01/07/2016: 330 am  . omeprazole (PRILOSEC) 40 MG capsule take 1 capsule twice a day with food   . oxyCODONE (OXY IR/ROXICODONE) 5 MG immediate release tablet Take 1-2 tablets (5-10 mg total) by mouth every 4 (four) hours as needed for moderate pain.   Vladimir Faster Glycol-Propyl Glycol (SYSTANE) 0.4-0.3 % SOLN Apply 1 drop to eye at bedtime.   . Trastuzumab (HERCEPTIN IV) Inject into the vein. Every 3 weeks   . triamcinolone ointment (KENALOG) 0.5 % Apply 1 application topically 2 (two) times daily.   . [DISCONTINUED] CARBOPLATIN IV Inject into the vein. Every 3 weeks   . [DISCONTINUED] dexamethasone (DECADRON) 4 MG tablet Take 2 tablets (8 mg total) by mouth 2 (two) times daily. Start the day before Taxotere. Then again the day after chemo for 3 days.   . [DISCONTINUED] DOCEtaxel (TAXOTERE IV) Inject into the vein. Every 3 weeks     . [DISCONTINUED] Pegfilgrastim (NEULASTA ONPRO [place]) Inject into the skin. Every 3 weeks  Facility-Administered Encounter Medications as of 09/18/2016  Medication  . [COMPLETED] 0.9 %  sodium chloride infusion  . [COMPLETED] acetaminophen (TYLENOL) tablet 650 mg  . [COMPLETED] diphenhydrAMINE (BENADRYL) capsule 50 mg  . heparin lock flush 100 unit/mL  . sodium chloride flush (NS) 0.9 % injection 10 mL  . sodium chloride flush (NS) 0.9 % injection 10 mL  . trastuzumab (HERCEPTIN) 450 mg in sodium chloride 0.9 % 250 mL chemo infusion    ALLERGIES:  Allergies  Allergen Reactions  . Latex Itching  . Naprosyn [Naproxen] Other (See Comments)    UNSPECIFIED REACTION   . Other     USE PAPER TAPE ONLY  . Codeine Nausea And Vomiting     PHYSICAL EXAM:  ECOG Performance status: 1 - Symptomatic, but largely independent       Physical Exam  Constitutional: She is oriented to person, place, and time and well-developed, well-nourished, and in no distress.  -Seen seated in treatment chair in infusion area.  -Cranial prosthesis in place   HENT:  Head: Normocephalic.  Mouth/Throat: Oropharynx is clear and moist.  Teeth in poor repair   Eyes: Conjunctivae are normal. No scleral icterus.  Neck: Normal range of motion. Neck supple.  Cardiovascular: Normal rate, regular rhythm and normal heart sounds.   Pulmonary/Chest: Effort normal and breath sounds normal. No respiratory distress. She has no wheezes. She has no rales.  Abdominal: Soft. Bowel sounds are normal. There is no tenderness. There is no rebound.  Musculoskeletal: Normal range of motion. She exhibits no edema.  Lymphadenopathy:    She has no cervical adenopathy.       Right: No supraclavicular adenopathy present.       Left: No supraclavicular adenopathy present.  Neurological: She is alert and oriented to person, place, and time. No cranial nerve deficit. Gait normal.  Skin: Skin is warm and dry.  Small sub-cm erythematous  lesion to (L) frontal scalp/hairline area. Additional very small lesion inferior to bigger lesion. Does not appear to vesicular.   Psychiatric: Mood, memory, affect and judgment normal.  Nursing note and vitals reviewed.    LABORATORY DATA:  I have reviewed the labs as listed.  CBC    Component Value Date/Time   WBC 4.0 09/18/2016 1000   RBC 4.29 09/18/2016 1000   HGB 13.4 09/18/2016 1000   HCT 40.9 09/18/2016 1000   PLT 208 09/18/2016 1000   MCV 95.3 09/18/2016 1000   MCH 31.2 09/18/2016 1000   MCHC 32.8 09/18/2016 1000   RDW 12.5 09/18/2016 1000   LYMPHSABS 2.3 09/18/2016 1000   MONOABS 0.3 09/18/2016 1000   EOSABS 0.2 09/18/2016 1000   BASOSABS 0.0 09/18/2016 1000   CMP Latest Ref Rng & Units 09/18/2016 08/07/2016 06/26/2016  Glucose 65 - 99 mg/dL 118(H) 93 97  BUN 6 - 20 mg/dL _0 Creatinine 0.44 - 1.00 mg/dL 0.67 0.75 0.66  Sodium 135 - 145 mmol/L 137 136 137  Potassium 3.5 - 5.1 mmol/L 4.0 4.0 4.0  Chloride 101 - 111 mmol/L 105 104 106  CO2 22 - 32 mmol/L _1 Calcium 8.9 - 10.3 mg/dL 9.0 9.0 8.9  Total Protein 6.5 - 8.1 g/dL 6.9 6.9 6.8  Total Bilirubin 0.3 - 1.2 mg/dL 0.7 0.5 0.3  Alkaline Phos 38 - 126 U/L 67 63 59  AST 15 - 41 U/L 26 27 32  ALT 14 - 54 U/L _2 PENDING LABS:    DIAGNOSTIC  IMAGING:  EKG: 06/26/16   Most recent ECHO: 08/24/16       PATHOLOGY:  Surgical path: 01/07/16          ASSESSMENT & PLAN:   Stage IA left breast invasive ductal carcinoma, ER-/PR-/HER2+: -s/p mastectomy and Taxotere/Carbo/Herceptin x 6 cycles; now on maintenance Herceptin every 3 weeks to total 1 year of therapy. She will continue Herceptin through 01/2017.   -No role for adjuvant anti-estrogen therapy given ER-.  -Labs reviewed and are adequate for Herceptin today.  -Next ECHO due on 08/24/16. EF remains normal at 55-60%. Oncology history updated. Next ECHO due 11/2016; orders placed today.  -Due for right breast mammogram in 12/2016 at the  [facility] per patient request.  -Return to cancer center every 3 weeks for continued Herceptin. Follow-up visit in 6 weeks.   Dental issues:  -Shared that she saw her dentist and needs to have several dental extractions, so she is being sent to an oral surgeon.  From an oncologic standpoint, it remains safe for her to have dental extractions while receiving Herceptin maintenance therapy.   Intermittent dysuria:  -Endorses intermittent episodes of dysuria that are self-limiting and resolve within 1 day usually. No fever, chills, or hematuria. No symptoms today. Does report that she does not drink enough water, which could certainly be contributing to urinary symptoms.   -Recommended she drink cranberry juice or take OTC cranberry capsules to see if her urinary symptoms improve.  If symptoms do not improve, then will obtain UA with urine culture.   (L) scalp pruritic scalp lesion:  -Clinically, denies pain but lesion is very pruritic. It does not appear to be shingles lesion; there is no associated pain and no additional lesions following a dermatome.  I suspect this is contact dermatitis. Recommended she apply OTC hydrocortisone area for symptom relief and to let us know if it does not heal.   Cardiology issues:  -Denies any recurrent episodes of chest pain. Maintain follow-up with cardiology as directed.   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 She has nail changes, which are expected s/p chemo.  Will continue to monitor peripheral neuropathy symptoms as they are not impairing ADLs.      Dispo:  -Next ECHO due 11/2016; orders placed today.  -Return every 3 weeks for maintenance Herceptin.  -Return for follow-up visit in 6 weeks.    All questions were answered to patient's stated satisfaction. Encouraged patient to call with any new concerns or questions before her next visit to the cancer center and we can certain see her sooner, if  needed.    Plan of care discussed with Dr. Talbert Cage, who agrees with the above aforementioned.    Orders placed this encounter:  Orders Placed This Encounter  Procedures  . ECHOCARDIOGRAM COMPLETE      Mike Craze, NP [place] [phone]

## 2016-09-18 ENCOUNTER — Encounter (HOSPITAL_BASED_OUTPATIENT_CLINIC_OR_DEPARTMENT_OTHER): Payer: BLUE CROSS/BLUE SHIELD | Admitting: Adult Health

## 2016-09-18 ENCOUNTER — Encounter (HOSPITAL_COMMUNITY): Payer: BLUE CROSS/BLUE SHIELD | Attending: Hematology & Oncology

## 2016-09-18 VITALS — BP 119/73 | HR 71 | Temp 97.6°F | Resp 18 | Wt 155.0 lb

## 2016-09-18 DIAGNOSIS — L989 Disorder of the skin and subcutaneous tissue, unspecified: Secondary | ICD-10-CM | POA: Diagnosis not present

## 2016-09-18 DIAGNOSIS — R300 Dysuria: Secondary | ICD-10-CM | POA: Diagnosis not present

## 2016-09-18 DIAGNOSIS — C50919 Malignant neoplasm of unspecified site of unspecified female breast: Secondary | ICD-10-CM

## 2016-09-18 DIAGNOSIS — Z5112 Encounter for antineoplastic immunotherapy: Secondary | ICD-10-CM | POA: Diagnosis not present

## 2016-09-18 DIAGNOSIS — Z171 Estrogen receptor negative status [ER-]: Secondary | ICD-10-CM | POA: Diagnosis not present

## 2016-09-18 DIAGNOSIS — C50412 Malignant neoplasm of upper-outer quadrant of left female breast: Secondary | ICD-10-CM

## 2016-09-18 LAB — COMPREHENSIVE METABOLIC PANEL
ALBUMIN: 3.9 g/dL (ref 3.5–5.0)
ALK PHOS: 67 U/L (ref 38–126)
ALT: 23 U/L (ref 14–54)
ANION GAP: 7 (ref 5–15)
AST: 26 U/L (ref 15–41)
BUN: 14 mg/dL (ref 6–20)
CHLORIDE: 105 mmol/L (ref 101–111)
CO2: 25 mmol/L (ref 22–32)
Calcium: 9 mg/dL (ref 8.9–10.3)
Creatinine, Ser: 0.67 mg/dL (ref 0.44–1.00)
GFR calc Af Amer: >60 mL/min (ref >60)
GFR calc non Af Amer: >60 mL/min (ref >60)
GLUCOSE: 118 mg/dL — AB (ref 65–99)
POTASSIUM: 4 mmol/L (ref 3.5–5.1)
SODIUM: 137 mmol/L (ref 135–145)
Total Bilirubin: 0.7 mg/dL (ref 0.3–1.2)
Total Protein: 6.9 g/dL (ref 6.5–8.1)

## 2016-09-18 LAB — CBC WITH DIFFERENTIAL/PLATELET
BASOS PCT: 0 %
Basophils Absolute: 0 10*3/uL (ref 0.0–0.1)
EOS ABS: 0.2 10*3/uL (ref 0.0–0.7)
Eosinophils Relative: 6 %
HCT: 40.9 % (ref 36.0–46.0)
HEMOGLOBIN: 13.4 g/dL (ref 12.0–15.0)
Lymphocytes Relative: 57 %
Lymphs Abs: 2.3 10*3/uL (ref 0.7–4.0)
MCH: 31.2 pg (ref 26.0–34.0)
MCHC: 32.8 g/dL (ref 30.0–36.0)
MCV: 95.3 fL (ref 78.0–100.0)
MONOS PCT: 8 %
Monocytes Absolute: 0.3 10*3/uL (ref 0.1–1.0)
NEUTROS PCT: 29 %
Neutro Abs: 1.2 10*3/uL — ABNORMAL LOW (ref 1.7–7.7)
PLATELETS: 208 10*3/uL (ref 150–400)
RBC: 4.29 MIL/uL (ref 3.87–5.11)
RDW: 12.5 % (ref 11.5–15.5)
WBC: 4 10*3/uL (ref 4.0–10.5)

## 2016-09-18 MED ORDER — DIPHENHYDRAMINE HCL 25 MG PO CAPS
50.0000 mg | ORAL_CAPSULE | Freq: Once | ORAL | Status: AC
  Administered 2016-09-18: 50 mg via ORAL

## 2016-09-18 MED ORDER — SODIUM CHLORIDE 0.9% FLUSH
10.0000 mL | INTRAVENOUS | Status: DC | PRN
  Administered 2016-09-18: 10 mL
  Filled 2016-09-18: qty 10

## 2016-09-18 MED ORDER — SODIUM CHLORIDE 0.9 % IV SOLN
Freq: Once | INTRAVENOUS | Status: AC
  Administered 2016-09-18: 10:00:00 via INTRAVENOUS

## 2016-09-18 MED ORDER — TRASTUZUMAB CHEMO 150 MG IV SOLR
450.0000 mg | Freq: Once | INTRAVENOUS | Status: AC
  Administered 2016-09-18: 450 mg via INTRAVENOUS
  Filled 2016-09-18: qty 21.43

## 2016-09-18 MED ORDER — ACETAMINOPHEN 325 MG PO TABS
ORAL_TABLET | ORAL | Status: AC
  Filled 2016-09-18: qty 2

## 2016-09-18 MED ORDER — ACETAMINOPHEN 325 MG PO TABS
650.0000 mg | ORAL_TABLET | Freq: Once | ORAL | Status: AC
  Administered 2016-09-18: 650 mg via ORAL

## 2016-09-18 MED ORDER — HEPARIN SOD (PORK) LOCK FLUSH 100 UNIT/ML IV SOLN
500.0000 [IU] | Freq: Once | INTRAVENOUS | Status: AC | PRN
  Administered 2016-09-18: 500 [IU]

## 2016-09-18 MED ORDER — DIPHENHYDRAMINE HCL 25 MG PO CAPS
ORAL_CAPSULE | ORAL | Status: AC
  Filled 2016-09-18: qty 2

## 2016-09-18 MED ORDER — HEPARIN SOD (PORK) LOCK FLUSH 100 UNIT/ML IV SOLN
INTRAVENOUS | Status: AC
  Filled 2016-09-18: qty 10

## 2016-09-18 NOTE — Patient Instructions (Signed)
[facility] Discharge Instructions for Patients Receiving Chemotherapy   Beginning January 23rd 2017 lab work for the [facility] will be done in the  Main lab at [facility] on 1st floor. If you have a lab appointment with the [facility] please come in thru the  Main Entrance and check in at the main information desk   Today you received the following infusion:  Herceptin  If you develop nausea and vomiting, or diarrhea that is not controlled by your medication, call the clinic.  The clinic phone number is [phone]. Office hours are Monday-Friday 8:30am-5:00pm.  BELOW ARE SYMPTOMS THAT SHOULD BE REPORTED IMMEDIATELY:  *FEVER GREATER THAN 101.0 F  *CHILLS WITH OR WITHOUT FEVER  NAUSEA AND VOMITING THAT IS NOT CONTROLLED WITH YOUR NAUSEA MEDICATION  *UNUSUAL SHORTNESS OF BREATH  *UNUSUAL BRUISING OR BLEEDING  TENDERNESS IN MOUTH AND THROAT WITH OR WITHOUT PRESENCE OF ULCERS  *URINARY PROBLEMS  *BOWEL PROBLEMS  UNUSUAL RASH Items with * indicate a potential emergency and should be followed up as soon as possible. If you have an emergency after office hours please contact your primary care physician or go to the nearest emergency department.  Please call the clinic during office hours if you have any questions or concerns.   You may also contact the Patient Navigator at [phone] should you have any questions or need assistance in obtaining follow up care.      Resources For Cancer Patients and their Caregivers ? American Cancer Society: Can assist with transportation, wigs, general needs, runs Look Good Feel Better.        (325) 412-3777 ? Cancer Care: Provides financial assistance, online support groups, medication/co-pay assistance.  1-800-813-HOPE 269-404-2765) ? [place] Assists [place] cancer patients and their families through emotional , educational and financial support.  517 564 2879 ? [place]  DSS Where to apply for food stamps, Medicaid and utility assistance. (979) 122-8130 ? RCATS: Transportation to medical appointments. (703)208-6756 ? Social Security Administration: May apply for disability if have a Stage IV cancer. 786-117-1339 587-640-9160 ? [employer], Disability and Transit Services: Assists with nutrition, care and transit needs. [phone]

## 2016-09-18 NOTE — Patient Instructions (Addendum)
[name] at [facility] Discharge Instructions  RECOMMENDATIONS MADE BY THE CONSULTANT AND ANY TEST RESULTS WILL BE SENT TO YOUR REFERRING PHYSICIAN.  You were seen today by Mike Craze NP. We will schedule your cardiac echo in 2 months You will continue to get your Herceptin infusion every 3 weeks Follow up in 6 weeks  Try hydrocortisone cream on the spot on your face You can drink cranberry juice or take a cranberry tablet for the pain when you urinate.  You do not have an infection at this time  Thank you for choosing [place] at [facility] to provide your oncology and hematology care.  To afford each patient quality time with our provider, please arrive at least 15 minutes before your scheduled appointment time.    If you have a lab appointment with the [facility] please come in thru the  Main Entrance and check in at the main information desk  You need to re-schedule your appointment should you arrive 10 or more minutes late.  We strive to give you quality time with our providers, and arriving late affects you and other patients whose appointments are after yours.  Also, if you no show three or more times for appointments you may be dismissed from the clinic at the providers discretion.     Again, thank you for choosing [facility].  Our hope is that these requests will decrease the amount of time that you wait before being seen by our physicians.       _____________________________________________________________  Should you have questions after your visit to [facility], please contact our office at [phone] between the hours of 8:30 a.m. and 4:30 p.m.  Voicemails left after 4:30 p.m. will not be returned until the following business day.  For prescription refill requests, have your pharmacy contact our office.       Resources For Cancer Patients and their Caregivers ? American Cancer  Society: Can assist with transportation, wigs, general needs, runs Look Good Feel Better.        917-039-9839 ? Cancer Care: Provides financial assistance, online support groups, medication/co-pay assistance.  1-800-813-HOPE 971-565-8909) ? [place] Assists [place] cancer patients and their families through emotional , educational and financial support.  4506541264 ? [place] DSS Where to apply for food stamps, Medicaid and utility assistance. 682-033-3542 ? RCATS: Transportation to medical appointments. 7176484901 ? Social Security Administration: May apply for disability if have a Stage IV cancer. (252)566-9282 (304)350-3409 ? [employer], Disability and Transit Services: Assists with nutrition, care and transit needs. [place] Support Programs: @10RELATIVEDAYS @ > Cancer Support Group  2nd Tuesday of the month 1pm-2pm, Journey Room  > Creative Journey  3rd Tuesday of the month 1130am-1pm, Journey Room  > Look Good Feel Better  1st Wednesday of the month 10am-12 noon, Journey Room (Call [place] to register [phone])

## 2016-10-08 ENCOUNTER — Other Ambulatory Visit (HOSPITAL_COMMUNITY): Payer: Self-pay | Admitting: Oncology

## 2016-10-09 ENCOUNTER — Encounter (HOSPITAL_COMMUNITY): Payer: Self-pay

## 2016-10-09 ENCOUNTER — Encounter (HOSPITAL_COMMUNITY): Payer: BLUE CROSS/BLUE SHIELD | Attending: Hematology & Oncology

## 2016-10-09 VITALS — BP 117/66 | HR 66 | Temp 97.7°F | Resp 18 | Wt 153.8 lb

## 2016-10-09 DIAGNOSIS — C50412 Malignant neoplasm of upper-outer quadrant of left female breast: Secondary | ICD-10-CM | POA: Diagnosis not present

## 2016-10-09 DIAGNOSIS — Z5112 Encounter for antineoplastic immunotherapy: Secondary | ICD-10-CM

## 2016-10-09 DIAGNOSIS — Z171 Estrogen receptor negative status [ER-]: Secondary | ICD-10-CM

## 2016-10-09 MED ORDER — ACETAMINOPHEN 325 MG PO TABS
650.0000 mg | ORAL_TABLET | Freq: Once | ORAL | Status: AC
  Administered 2016-10-09: 650 mg via ORAL
  Filled 2016-10-09: qty 2

## 2016-10-09 MED ORDER — HEPARIN SOD (PORK) LOCK FLUSH 100 UNIT/ML IV SOLN
500.0000 [IU] | Freq: Once | INTRAVENOUS | Status: AC | PRN
  Administered 2016-10-09: 500 [IU]
  Filled 2016-10-09: qty 5

## 2016-10-09 MED ORDER — DIPHENHYDRAMINE HCL 25 MG PO CAPS
ORAL_CAPSULE | ORAL | Status: AC
  Filled 2016-10-09: qty 1

## 2016-10-09 MED ORDER — SODIUM CHLORIDE 0.9% FLUSH
10.0000 mL | INTRAVENOUS | Status: DC | PRN
  Administered 2016-10-09: 10 mL
  Filled 2016-10-09: qty 10

## 2016-10-09 MED ORDER — SODIUM CHLORIDE 0.9 % IV SOLN
Freq: Once | INTRAVENOUS | Status: AC
  Administered 2016-10-09: 09:00:00 via INTRAVENOUS

## 2016-10-09 MED ORDER — SODIUM CHLORIDE 0.9 % IV SOLN
450.0000 mg | Freq: Once | INTRAVENOUS | Status: AC
  Administered 2016-10-09: 450 mg via INTRAVENOUS
  Filled 2016-10-09: qty 21.43

## 2016-10-09 MED ORDER — DIPHENHYDRAMINE HCL 25 MG PO CAPS
50.0000 mg | ORAL_CAPSULE | Freq: Once | ORAL | Status: AC
  Administered 2016-10-09: 50 mg via ORAL
  Filled 2016-10-09: qty 2

## 2016-10-09 NOTE — Patient Instructions (Addendum)
[facility] Discharge Instructions for Patients Receiving Chemotherapy   Beginning January 23rd 2017 lab work for the Cancer Center will be done in the  Main lab at [facility] on 1st floor. If you have a lab appointment with the Cancer Center please come in thru the  Main Entrance and check in at the main information desk   Today you received the following chemotherapy agents Herceptin. Follow-up as scheduled. Call clinic for any questions or concerns  To help prevent nausea and vomiting after your treatment, we encourage you to take your nausea medication.   If you develop nausea and vomiting, or diarrhea that is not controlled by your medication, call the clinic.  The clinic phone number is [phone]. Office hours are Monday-Friday 8:30am-5:00pm.  BELOW ARE SYMPTOMS THAT SHOULD BE REPORTED IMMEDIATELY:  *FEVER GREATER THAN 101.0 F  *CHILLS WITH OR WITHOUT FEVER  NAUSEA AND VOMITING THAT IS NOT CONTROLLED WITH YOUR NAUSEA MEDICATION  *UNUSUAL SHORTNESS OF BREATH  *UNUSUAL BRUISING OR BLEEDING  TENDERNESS IN MOUTH AND THROAT WITH OR WITHOUT PRESENCE OF ULCERS  *URINARY PROBLEMS  *BOWEL PROBLEMS  UNUSUAL RASH Items with * indicate a potential emergency and should be followed up as soon as possible. If you have an emergency after office hours please contact your primary care physician or go to the nearest emergency department.  Please call the clinic during office hours if you have any questions or concerns.   You may also contact the Patient Navigator at [phone] should you have any questions or need assistance in obtaining follow up care.      Resources For Cancer Patients and their Caregivers ? American Cancer Society: Can assist with transportation, wigs, general needs, runs Look Good Feel Better.        1-888-227-6333 ? Cancer Care: Provides financial assistance, online support groups, medication/co-pay assistance.  1-800-813-HOPE  (4673) ? [facility] Assists [place] cancer patients and their families through emotional , educational and financial support.  336-427-4357 ? [place] DSS Where to apply for food stamps, Medicaid and utility assistance. 336-342-1394 ? RCATS: Transportation to medical appointments. 336-347-2287 ? Social Security Administration: May apply for disability if have a Stage IV cancer. 336-342-7796 1-800-772-1213 ? [place] [employer], Disability and Transit Services: Assists with nutrition, care and transit needs. [phone]         

## 2016-10-09 NOTE — Progress Notes (Signed)
[name] tolerated Herceptin infusion well without complaints or incident. No labs needed today. VSS upon discharge. Pt discharged self ambulatory in satisfactory condition accompanied by her husband

## 2016-10-22 IMAGING — DX DG CHEST 2V
2 series · 2 of 2 positions shown · non-contrast
Comparison: 11/10/2012

CLINICAL DATA: Chest pain and neck pain.

EXAM:
CHEST  2 VIEW

[chest pa]
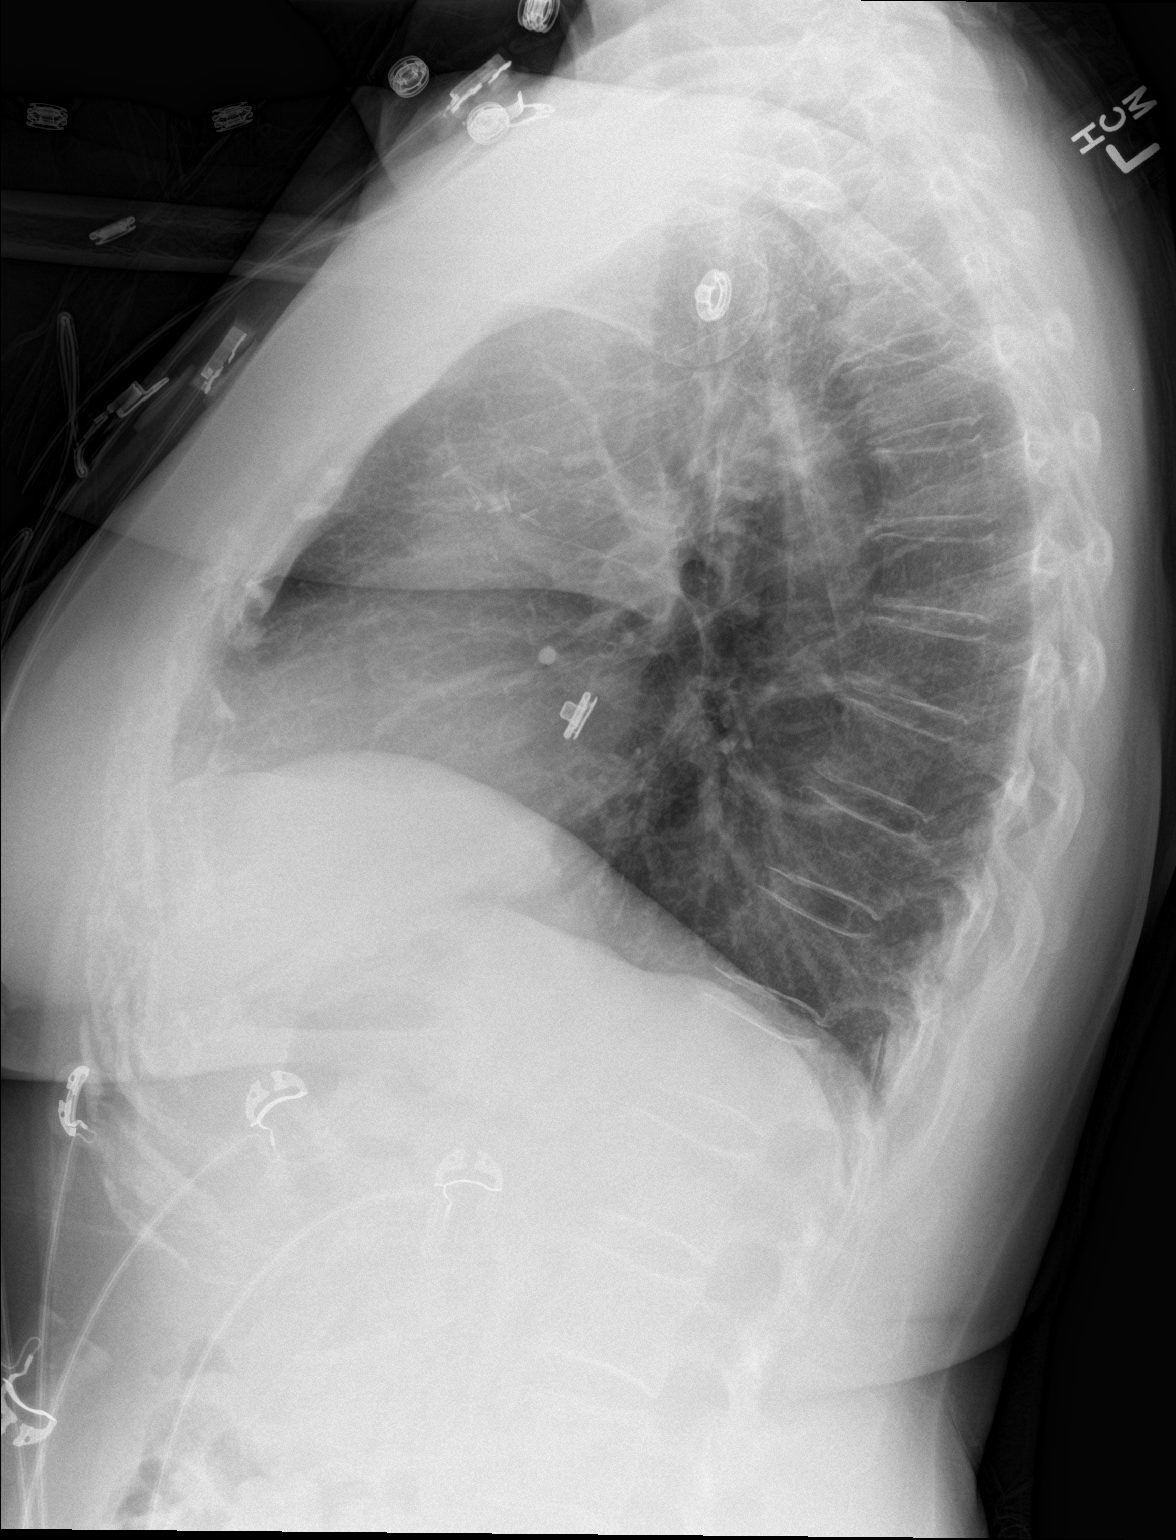

[chest lat]
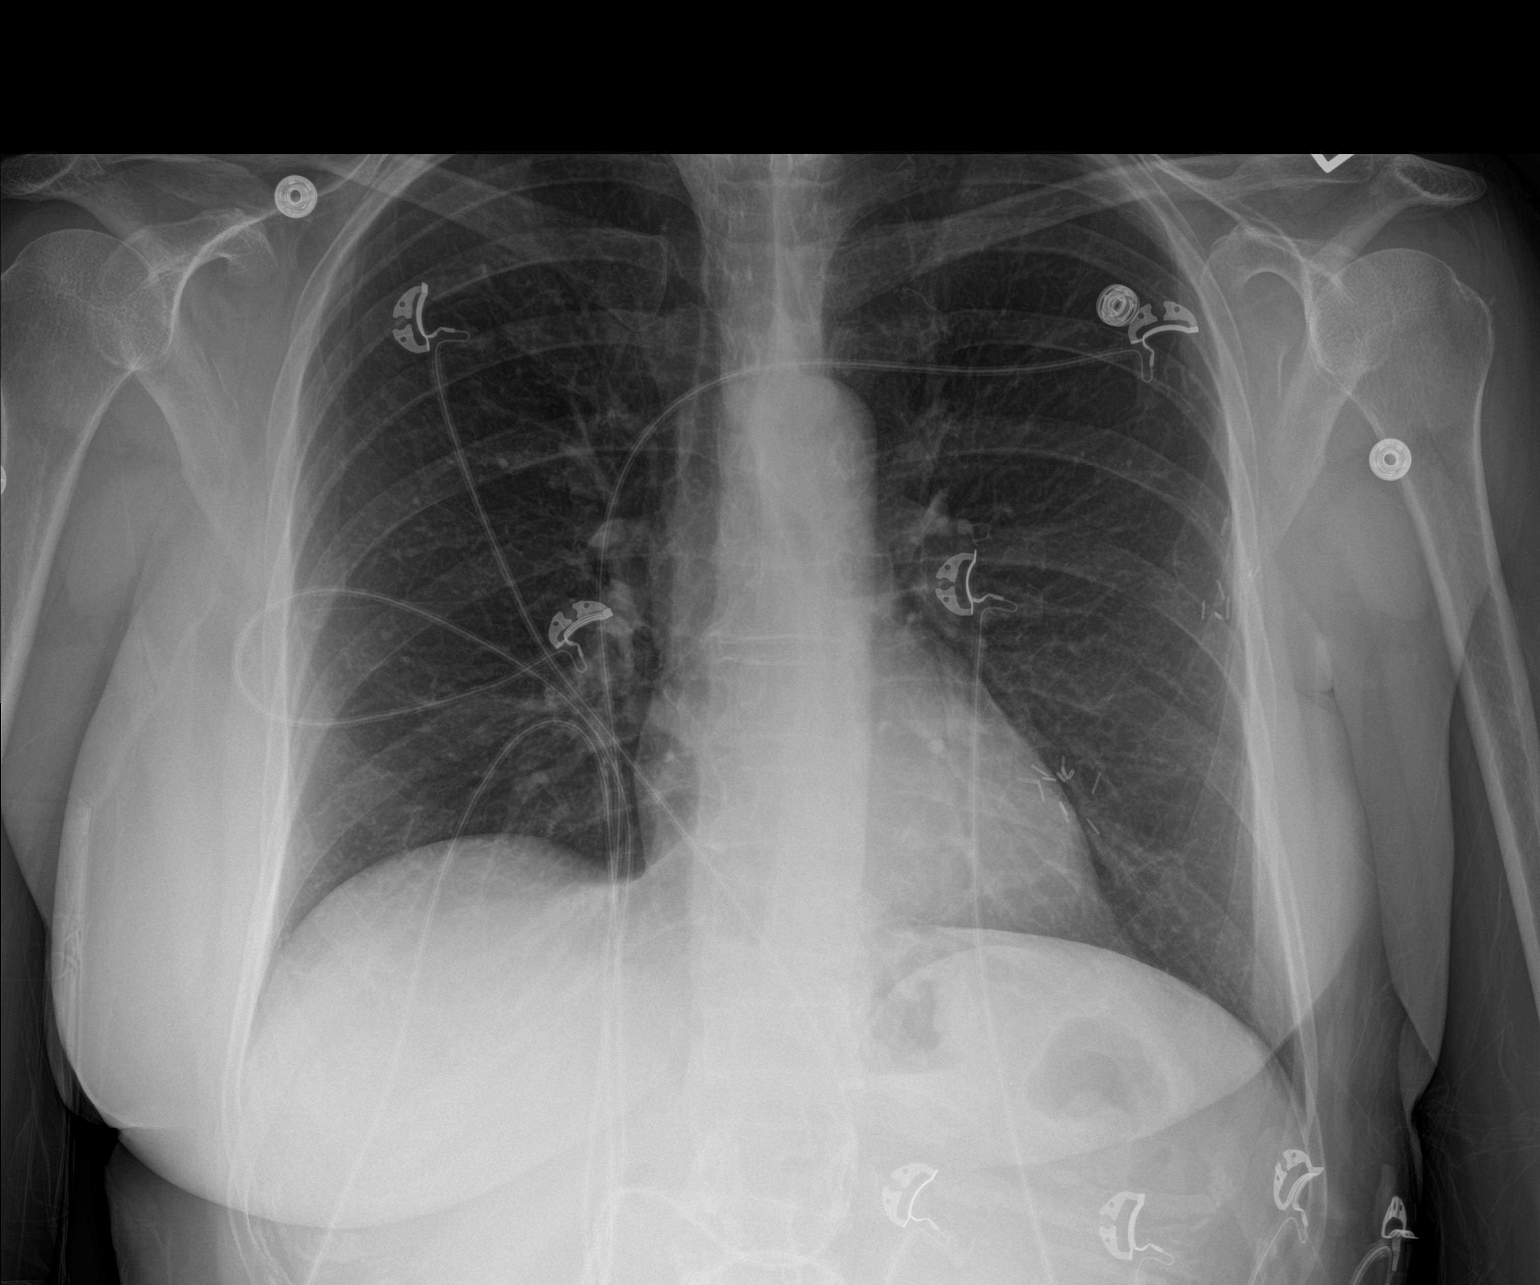

[2 of 2 positions shown; findings below may reference images not displayed]

FINDINGS: Left thorax postsurgical changes are stable.

Cardiomediastinal silhouette is normal. Mediastinal contours appear
intact.

There is no evidence of focal airspace consolidation, pleural
effusion or pneumothorax.

Osseous structures are without acute abnormality. Soft tissues are
grossly normal.
IMPRESSION: No active cardiopulmonary disease.

## 2016-10-29 DIAGNOSIS — N812 Incomplete uterovaginal prolapse: Secondary | ICD-10-CM | POA: Diagnosis not present

## 2016-10-30 ENCOUNTER — Encounter (HOSPITAL_BASED_OUTPATIENT_CLINIC_OR_DEPARTMENT_OTHER): Payer: BLUE CROSS/BLUE SHIELD | Admitting: Adult Health

## 2016-10-30 ENCOUNTER — Encounter (HOSPITAL_COMMUNITY): Payer: Self-pay | Admitting: Adult Health

## 2016-10-30 ENCOUNTER — Encounter (HOSPITAL_BASED_OUTPATIENT_CLINIC_OR_DEPARTMENT_OTHER): Payer: BLUE CROSS/BLUE SHIELD

## 2016-10-30 VITALS — BP 109/65 | HR 67 | Temp 97.5°F | Resp 16 | Ht 66.0 in | Wt 153.5 lb

## 2016-10-30 VITALS — BP 118/68 | HR 64 | Temp 97.8°F | Resp 18

## 2016-10-30 DIAGNOSIS — R5383 Other fatigue: Secondary | ICD-10-CM

## 2016-10-30 DIAGNOSIS — Z171 Estrogen receptor negative status [ER-]: Secondary | ICD-10-CM

## 2016-10-30 DIAGNOSIS — C50412 Malignant neoplasm of upper-outer quadrant of left female breast: Secondary | ICD-10-CM

## 2016-10-30 DIAGNOSIS — C50919 Malignant neoplasm of unspecified site of unspecified female breast: Secondary | ICD-10-CM

## 2016-10-30 DIAGNOSIS — Z5112 Encounter for antineoplastic immunotherapy: Secondary | ICD-10-CM

## 2016-10-30 DIAGNOSIS — N951 Menopausal and female climacteric states: Secondary | ICD-10-CM

## 2016-10-30 LAB — CBC WITH DIFFERENTIAL/PLATELET
Basophils Absolute: 0 10*3/uL (ref 0.0–0.1)
Basophils Relative: 0 %
EOS ABS: 0.2 10*3/uL (ref 0.0–0.7)
Eosinophils Relative: 4 %
HEMATOCRIT: 39.5 % (ref 36.0–46.0)
HEMOGLOBIN: 13.2 g/dL (ref 12.0–15.0)
Lymphocytes Relative: 50 %
Lymphs Abs: 2.3 10*3/uL (ref 0.7–4.0)
MCH: 31.3 pg (ref 26.0–34.0)
MCHC: 33.4 g/dL (ref 30.0–36.0)
MCV: 93.6 fL (ref 78.0–100.0)
Monocytes Absolute: 0.4 10*3/uL (ref 0.1–1.0)
Monocytes Relative: 9 %
NEUTROS ABS: 1.7 10*3/uL (ref 1.7–7.7)
NEUTROS PCT: 37 %
Platelets: 220 10*3/uL (ref 150–400)
RBC: 4.22 MIL/uL (ref 3.87–5.11)
RDW: 13 % (ref 11.5–15.5)
WBC: 4.5 10*3/uL (ref 4.0–10.5)

## 2016-10-30 LAB — COMPREHENSIVE METABOLIC PANEL
ALBUMIN: 3.8 g/dL (ref 3.5–5.0)
ALK PHOS: 72 U/L (ref 38–126)
ALT: 28 U/L (ref 14–54)
ANION GAP: 7 (ref 5–15)
AST: 27 U/L (ref 15–41)
BILIRUBIN TOTAL: 0.7 mg/dL (ref 0.3–1.2)
BUN: 15 mg/dL (ref 6–20)
CALCIUM: 9.2 mg/dL (ref 8.9–10.3)
CO2: 25 mmol/L (ref 22–32)
CREATININE: 0.72 mg/dL (ref 0.44–1.00)
Chloride: 102 mmol/L (ref 101–111)
GFR calc Af Amer: >60 mL/min (ref >60)
GFR calc non Af Amer: >60 mL/min (ref >60)
Glucose, Bld: 95 mg/dL (ref 65–99)
Potassium: 4.1 mmol/L (ref 3.5–5.1)
Sodium: 134 mmol/L — ABNORMAL LOW (ref 135–145)
Total Protein: 6.9 g/dL (ref 6.5–8.1)

## 2016-10-30 LAB — IRON AND TIBC
IRON: 64 ug/dL (ref 28–170)
SATURATION RATIOS: 21 % (ref 10.4–31.8)
TIBC: 298 ug/dL (ref 250–450)
UIBC: 234 ug/dL

## 2016-10-30 LAB — FOLATE: FOLATE: 10.9 ng/mL (ref >5.9)

## 2016-10-30 LAB — FERRITIN: FERRITIN: 97 ng/mL (ref 11–307)

## 2016-10-30 LAB — VITAMIN B12: VITAMIN B 12: 319 pg/mL (ref 180–914)

## 2016-10-30 LAB — TSH: TSH: 1.395 u[IU]/mL (ref 0.350–4.500)

## 2016-10-30 MED ORDER — TRASTUZUMAB CHEMO 150 MG IV SOLR
450.0000 mg | Freq: Once | INTRAVENOUS | Status: AC
  Administered 2016-10-30: 450 mg via INTRAVENOUS
  Filled 2016-10-30: qty 21.43

## 2016-10-30 MED ORDER — HEPARIN SOD (PORK) LOCK FLUSH 100 UNIT/ML IV SOLN
500.0000 [IU] | Freq: Once | INTRAVENOUS | Status: AC | PRN
  Administered 2016-10-30: 500 [IU]
  Filled 2016-10-30: qty 5

## 2016-10-30 MED ORDER — DIPHENHYDRAMINE HCL 25 MG PO CAPS
50.0000 mg | ORAL_CAPSULE | Freq: Once | ORAL | Status: AC
  Administered 2016-10-30: 50 mg via ORAL

## 2016-10-30 MED ORDER — SODIUM CHLORIDE 0.9% FLUSH
10.0000 mL | INTRAVENOUS | Status: DC | PRN
  Administered 2016-10-30: 10 mL
  Filled 2016-10-30: qty 10

## 2016-10-30 MED ORDER — ACETAMINOPHEN 325 MG PO TABS: 650.0000 mg | ORAL_TABLET | Freq: Once | ORAL | Status: DC

## 2016-10-30 MED ORDER — DIPHENHYDRAMINE HCL 25 MG PO CAPS
ORAL_CAPSULE | ORAL | Status: AC
  Filled 2016-10-30: qty 2

## 2016-10-30 MED ORDER — SODIUM CHLORIDE 0.9 % IV SOLN
Freq: Once | INTRAVENOUS | Status: AC
  Administered 2016-10-30: 10:00:00 via INTRAVENOUS

## 2016-10-30 MED ORDER — GABAPENTIN 100 MG PO CAPS: 100.0000 mg | ORAL_CAPSULE | Freq: Every day | ORAL | 1 refills | Status: DC

## 2016-10-30 NOTE — Progress Notes (Signed)
[name], [place] [address]   CLINIC:  Medical Oncology/Hematology  PCP:  Sharilyn Sites, [address] [place] [address] [phone]   REASON FOR VISIT:  Follow-up for Stage IA left breast invasive ductal carcinoma; ER-/PR-/HER2+  CURRENT THERAPY: Maintenance Herceptin every 21 days through 01/2017   BRIEF ONCOLOGIC HISTORY:    Breast cancer of upper-outer quadrant of left female breast ([place])   11/07/1999 Surgery    Lumpectomy/sentinel LN biopsy invasive ductal carcinoma and DCIS at [facility], 0/6 sentinel LN      11/10/1999 - 01/08/2005 Anti-estrogen oral therapy    Tamoxifen 20 mg daily      11/24/1999 - 01/02/2000 Radiation Therapy         12/18/2015 Mammogram    2D digital screening bilateral mammogram with CAD and adjunct TOMO, in the L breast a possible mass warrants further evaluation. In R breast no findings suspicious for malignancy      12/24/2015 Pathology Results    Invasive ductal carcinoma Grade 3 Left core needle biopsy 1:30 o clock ER- PR- Ki67 70%, HER 2 positive      12/24/2015 Imaging    L breast Ultrasound, notes palpable area of concern demonstrates a hypoechoic irregular mass at 1:30, 7 cm from the nipple measuring 1.3 x 0.9 x 1.4 cm. No evidence of L axillary LAD      01/07/2016 Procedure    Left simple mastectomy by Dr. Georgette Dover      01/08/2016 Pathology Results    Breast, simple mastectomy, Left - INVASIVE DUCTAL CARCINOMA, GRADE III/III, SPANNING 1.4 CM. - THE SURGICAL RESECTION MARGINS ARE NEGATIVE FOR CARCINOMA.      01/23/2016 Genetic Testing    Patient refused genetic testing at time of consultation with Roma Kayser.  "Despite our recommendation, Ms. Serviss did not wish to pursue genetic testing at today's visit."      01/28/2016 Echocardiogram    Left ventricle: The cavity size was normal. Wall thickness was normal. Systolic function was normal. The estimated ejection fraction  was in the range of 55% to 60%. Wall motion was normal; there were no regional wall motion abnormalities. Doppler parameters are consistent with abnormal left ventricular relaxation (grade 1 diastolic dysfunction).      01/31/2016 - 05/15/2016 Chemotherapy    The patient had palonosetron (ALOXI) injection 0.25 mg, 0.25 mg, Intravenous,  Once, 1 of 6 cycles  pegfilgrastim (NEULASTA ONPRO KIT) injection 6 mg, 6 mg, Subcutaneous, Once, 1 of 6 cycles  CARBOplatin (PARAPLATIN) 560 mg in sodium chloride 0.9 % 250 mL chemo infusion, 560 mg (100 % of original dose 558.5 mg), Intravenous,  Once, 1 of 6 cycles Dose modification:   (original dose 558.5 mg, Cycle 1),   (original dose 558.5 mg, Cycle 2)  DOCEtaxel (TAXOTERE) 140 mg in dextrose 5 % 250 mL chemo infusion, 75 mg/m2 = 140 mg, Intravenous,  Once, 1 of 6 cycles  for chemotherapy treatment.        01/31/2016 -  Antibody Plan    Herceptin x 52 weeks      08/24/2016 Echocardiogram    Left ventricle: The cavity size was normal. Wall thickness was normal. Systolic function was normal. The estimated ejection fraction was in the range of 55% to 60%. Wall motion was normal; there were no regional wall motion abnormalities. Doppler parameters are consistent with abnormal left ventricular relaxation (grade 1 diastolic dysfunction).        INTERVAL HISTORY:  Ms.  Sassi is here for follow-up and consideration for next cycle of maintenance Herceptin therapy.   Overall, she tells me she has been feeling pretty well, with the exception of feeling very fatigued. States "usually when I get this tired, my iron levels are low."    She has occasional dizziness, which only happens occasionally and "happens off and on."  She has meclizine that she can take PRN; symptoms are no worse or frequent than her normal.    Her peripheral neuropathy is improving, particularly in her fingertips.  Endorses some night sweats, which she attributes  to hot flashes; "I just get hot at night when I sleep."  This affects her ability to sleep well each night.   She had 2 teeth extracted last week and is well-recovered.  She saw her gynecologist yesterday and was told that her "uterus has dropped."  Otherwise, she is largely without complaints today.  Appetite is 75%; energy levels 75% and she denies any pain.   She is looking forward to her 43th wedding anniversary.    REVIEW OF SYSTEMS:  Review of Systems  Constitutional: Positive for fatigue. Negative for appetite change, chills and fever.  HENT:  Negative.  Negative for lump/mass and nosebleeds.   Eyes: Negative.   Respiratory: Negative.  Negative for cough and shortness of breath.   Cardiovascular: Negative.  Negative for chest pain and leg swelling.  Gastrointestinal: Negative.  Negative for abdominal pain, blood in stool, constipation, diarrhea, nausea and vomiting.  Endocrine: Positive for hot flashes.  Genitourinary: Positive for dysuria (occasional; gynecologist managing). Negative for hematuria.   Musculoskeletal: Negative.  Negative for arthralgias.  Skin: Negative.  Negative for rash.  Neurological: Positive for dizziness and numbness. Negative for headaches.  Hematological: Negative.  Negative for adenopathy. Does not bruise/bleed easily.  Psychiatric/Behavioral: Positive for sleep disturbance (d/t hot flashes at night ). Negative for depression. The patient is not nervous/anxious.      PAST MEDICAL/SURGICAL HISTORY:  Past Medical History:  Diagnosis Date  . Anxiety   . Breast cancer ([place])   . Breast cancer of upper-outer quadrant of left female breast ([place]) 01/07/2016  . Complication of anesthesia   . DDD (degenerative disc disease), cervical   . DDD (degenerative disc disease), lumbar   . Depression   . Dysrhythmia    last cardiology ov 06-13-2013 in [facility], takes metoprolol for HR  . Family history of breast cancer   . Family history of colon cancer   .  Fibromyalgia   . GERD (gastroesophageal reflux disease)   . Hematuria   . History of echocardiogram 2015   mild mital insufficiency, otherwise normal  . Leaky heart valve   . Multiple thyroid nodules   . PAC (premature atrial contraction)   . Palpitations   . PONV (postoperative nausea and vomiting)   . SOB (shortness of breath)   . Tachycardia    Past Surgical History:  Procedure Laterality Date  . BREAST LUMPECTOMY    . LAPAROSCOPIC APPENDECTOMY N/A 11/10/2012   Procedure: APPENDECTOMY LAPAROSCOPIC;  Surgeon: Jamesetta So, MD;  Location: [facility];  Service: General;  Laterality: N/A;  . MASTECTOMY    . MASTECTOMY W/ SENTINEL NODE BIOPSY Left 01/07/2016  . MASTECTOMY W/ SENTINEL NODE BIOPSY Left 01/07/2016   Procedure: LEFT TOTAL MASTECTOMY WITH LEFT AXILLARY SENTINEL LYMPH NODE BIOPSY;  Surgeon: Donnie Mesa, MD;  Location: [facility];  Service: General;  Laterality: Left;  . PORTACATH PLACEMENT Right 01/07/2016   IJ  . PORTACATH  PLACEMENT Right 01/07/2016   Procedure: INSERTION PORT-A-CATH RIGHT INTERNAL JUGULAR WITH ULTRASOUND;  Surgeon: Donnie Mesa, MD;  Location: [facility];  Service: General;  Laterality: Right;     SOCIAL HISTORY:  Social History   Social History  . Marital status: Married    Spouse name: N/A  . Number of children: 2  . Years of education: N/A   Occupational History  . Not on file.   Social History Main Topics  . Smoking status: Never Smoker  . Smokeless tobacco: Never Used  . Alcohol use No  . Drug use: No  . Sexual activity: Yes     Comment: married   Other Topics Concern  . Not on file   Social History Narrative  . No narrative on file    FAMILY HISTORY:  Family History  Problem Relation Age of Onset  . Cancer Mother        colon  . Diabetes Father   . Diabetes Sister   . Alcohol abuse Brother   . Cancer Brother 14       lung cancer  . Cancer Brother        penile  . Breast cancer Maternal Aunt   . Cancer Other        unknown form  of cancer    CURRENT MEDICATIONS:  Outpatient Encounter Prescriptions as of 10/30/2016  Medication Sig Note  . acetaminophen (TYLENOL) 500 MG tablet Take 500 mg by mouth every 6 (six) hours as needed for mild pain.   Marland Kitchen ALPRAZolam (XANAX) 0.5 MG tablet Take 1 tablet (0.5 mg total) by mouth 3 (three) times daily as needed for anxiety.   Marland Kitchen antiseptic oral rinse (BIOTENE) LIQD 15 mLs by Mouth Rinse route as needed for dry mouth.   . metoprolol tartrate (LOPRESSOR) 25 MG tablet Take 12.5-25 mg by mouth 2 (two) times daily. 25 mg in the morning. 12.5 mg in the evening, 01/07/2016: 330 am  . omeprazole (PRILOSEC) 40 MG capsule take 1 capsule twice a day with food   . Trastuzumab (HERCEPTIN IV) Inject into the vein. Every 3 weeks   . Cholecalciferol (VITAMIN D3) 1000 units CAPS Take 1,000 Units by mouth every morning.   . meclizine (ANTIVERT) 25 MG tablet Take 1 tablet (25 mg total) by mouth 3 (three) times daily as needed for dizziness. (Patient not taking: Reported on 10/30/2016)   . oxyCODONE (OXY IR/ROXICODONE) 5 MG immediate release tablet Take 1-2 tablets (5-10 mg total) by mouth every 4 (four) hours as needed for moderate pain. (Patient not taking: Reported on 10/30/2016)   . Polyethyl Glycol-Propyl Glycol (SYSTANE) 0.4-0.3 % SOLN Apply 1 drop to eye at bedtime.   . [DISCONTINUED] famotidine (PEPCID) 20 MG tablet Take 1 tablet (20 mg total) by mouth 2 (two) times daily.   . [DISCONTINUED] triamcinolone ointment (KENALOG) 0.5 % Apply 1 application topically 2 (two) times daily.    Facility-Administered Encounter Medications as of 10/30/2016  Medication  . sodium chloride flush (NS) 0.9 % injection 10 mL    ALLERGIES:  Allergies  Allergen Reactions  . Latex Itching  . Naprosyn [Naproxen] Other (See Comments)    UNSPECIFIED REACTION   . Other     USE PAPER TAPE ONLY  . Codeine Nausea And Vomiting     PHYSICAL EXAM:  ECOG Performance status: 1 - Symptomatic, but largely independent    Vitals:   10/30/16 0942  BP: 109/65  Pulse: 67  Resp: 16  Temp: 97.5 F (36.4  C)   Filed Weights   10/30/16 0942  Weight: 153 lb 8 oz (69.6 kg)    Physical Exam  Constitutional: She is oriented to person, place, and time and well-developed, well-nourished, and in no distress.  HENT:  Head: Normocephalic.  Mouth/Throat: Oropharynx is clear and moist.  Eyes: Conjunctivae are normal. No scleral icterus.  Neck: Normal range of motion. Neck supple.  Cardiovascular: Normal rate, regular rhythm and normal heart sounds.   Pulmonary/Chest: Effort normal and breath sounds normal. No respiratory distress. She has no wheezes. She has no rales.  Abdominal: Soft. Bowel sounds are normal. There is no tenderness.  Musculoskeletal: Normal range of motion. She exhibits no edema.  Lymphadenopathy:    She has no cervical adenopathy.       Right: No supraclavicular adenopathy present.       Left: No supraclavicular adenopathy present.  Neurological: She is alert and oriented to person, place, and time. No cranial nerve deficit. Gait normal.  Skin: Skin is warm and dry. No rash noted.  Psychiatric: Mood, memory, affect and judgment normal.  Nursing note and vitals reviewed.    LABORATORY DATA:  I have reviewed the labs as listed.  CBC    Component Value Date/Time   WBC 4.5 10/30/2016 0943   RBC 4.22 10/30/2016 0943   HGB 13.2 10/30/2016 0943   HCT 39.5 10/30/2016 0943   PLT 220 10/30/2016 0943   MCV 93.6 10/30/2016 0943   MCH 31.3 10/30/2016 0943   MCHC 33.4 10/30/2016 0943   RDW 13.0 10/30/2016 0943   LYMPHSABS 2.3 10/30/2016 0943   MONOABS 0.4 10/30/2016 0943   EOSABS 0.2 10/30/2016 0943   BASOSABS 0.0 10/30/2016 0943   CMP Latest Ref Rng & Units 10/30/2016 09/18/2016 08/07/2016  Glucose 65 - 99 mg/dL 95 118(H) 93  BUN 6 - 20 mg/dL _0 Creatinine 0.44 - 1.00 mg/dL 0.72 0.67 0.75  Sodium 135 - 145 mmol/L 134(L) 137 136  Potassium 3.5 - 5.1 mmol/L 4.1 4.0 4.0  Chloride 101  - 111 mmol/L 102 105 104  CO2 22 - 32 mmol/L _1 Calcium 8.9 - 10.3 mg/dL 9.2 9.0 9.0  Total Protein 6.5 - 8.1 g/dL 6.9 6.9 6.9  Total Bilirubin 0.3 - 1.2 mg/dL 0.7 0.7 0.5  Alkaline Phos 38 - 126 U/L 72 67 63  AST 15 - 41 U/L _2 ALT 14 - 54 U/L _3 PENDING LABS:    DIAGNOSTIC IMAGING:  Most recent ECHO: 08/24/16       PATHOLOGY:  Surgical path: 01/07/16           ASSESSMENT & PLAN:   Stage IA left breast invasive ductal carcinoma, ER-/PR-/HER2+: -s/p mastectomy and Taxotere/Carbo/Herceptin x 6 cycles; now on maintenance Herceptin every 3 weeks to total 1 year of therapy. She will continue Herceptin through 01/2017.   -No role for adjuvant anti-estrogen therapy given ER-.  -Labs reviewed and are adequate for Herceptin today.  -ECHO 08/24/16 remains normal with EF 55-60%. Next ECHO due 11/18/16 and is already scheduled.  -(R) breast mammogram due on 12/17/16 at the [facility] per patient request.   -Continue Herceptin every 3 weeks.  -Return to cancer center in 6 weeks for follow-up.   Fatigue:  -Hgb normal today 13.2 g/dL.  Notes that she was previously taking iron pills, but stopped when she started chemotherapy.  -Will add-on anemia panel (vitamin B12, folate, ferritin, iron/TIBC),  TSH, and vitamin D to further evaluate her fatigue concerns.  We will contact her with results when they are available.   Cardiology issues:  -Stable; no reports of chest pain.  -Maintain follow-up with cardiology as directed.   Peripheral neuropathy likely secondary to chemotherapy: -Improving. Will keep monitoring.   Hot flashes:  -Likely secondary to menopause. Discussed trial of gabapentin 300 mg at bedtime to see if her symptoms improve. She would prefer to try a lower dose initially, which is not unreasonable.  Recommended that she start with 100 mg at bedtime, and if ineffective then she can increase to 2 capsules at bedtime.  Discussed possible  side effects including drowsiness and dizziness. Encouraged her to let us know if she has undesirable side effects and we can make changes if needed.     Dispo:  -ECHO in 11/2016 as scheduled.  -Mammogram in 12/2016 as scheduled.  -Continue Herceptin every 3 weeks.  -Return to cancer center in 6 weeks for follow-up and Herceptin.    All questions were answered to patient's stated satisfaction. Encouraged patient to call with any new concerns or questions before her next visit to the cancer center and we can certain see her sooner, if needed.    Plan of care discussed with Dr. Talbert Cage, who agrees with the above aforementioned.    Orders placed this encounter:  Orders Placed This Encounter  Procedures  . Vitamin B12  . Folate  . Iron and TIBC  . Ferritin  . VITAMIN D 25 Hydroxy (Vit-D Deficiency, Fractures)  . TSH      Mike Craze, NP [facility] [phone]

## 2016-10-30 NOTE — Progress Notes (Signed)
[name] tolerated Herceptin infusion well without complaints or incident. Labs reviewed with Dr. Talbert Cage prior to administering this medication. VSS upon discharge. Pt discharged self ambulatory in satisfactory condition accompanied by her husband

## 2016-10-30 NOTE — Patient Instructions (Signed)
[facility] Discharge Instructions for Patients Receiving Chemotherapy   Beginning January 23rd 2017 lab work for the Cancer Center will be done in the  Main lab at [facility] on 1st floor. If you have a lab appointment with the Cancer Center please come in thru the  Main Entrance and check in at the main information desk   Today you received the following chemotherapy agents Herceptin. Follow-up as scheduled. Call clinic for any questions or concerns  To help prevent nausea and vomiting after your treatment, we encourage you to take your nausea medication.   If you develop nausea and vomiting, or diarrhea that is not controlled by your medication, call the clinic.  The clinic phone number is [phone]. Office hours are Monday-Friday 8:30am-5:00pm.  BELOW ARE SYMPTOMS THAT SHOULD BE REPORTED IMMEDIATELY:  *FEVER GREATER THAN 101.0 F  *CHILLS WITH OR WITHOUT FEVER  NAUSEA AND VOMITING THAT IS NOT CONTROLLED WITH YOUR NAUSEA MEDICATION  *UNUSUAL SHORTNESS OF BREATH  *UNUSUAL BRUISING OR BLEEDING  TENDERNESS IN MOUTH AND THROAT WITH OR WITHOUT PRESENCE OF ULCERS  *URINARY PROBLEMS  *BOWEL PROBLEMS  UNUSUAL RASH Items with * indicate a potential emergency and should be followed up as soon as possible. If you have an emergency after office hours please contact your primary care physician or go to the nearest emergency department.  Please call the clinic during office hours if you have any questions or concerns.   You may also contact the Patient Navigator at [phone] should you have any questions or need assistance in obtaining follow up care.      Resources For Cancer Patients and their Caregivers ? American Cancer Society: Can assist with transportation, wigs, general needs, runs Look Good Feel Better.        1-888-227-6333 ? Cancer Care: Provides financial assistance, online support groups, medication/co-pay assistance.  1-800-813-HOPE  (4673) ? [facility] Assists [place] cancer patients and their families through emotional , educational and financial support.  336-427-4357 ? [place] DSS Where to apply for food stamps, Medicaid and utility assistance. 336-342-1394 ? RCATS: Transportation to medical appointments. 336-347-2287 ? Social Security Administration: May apply for disability if have a Stage IV cancer. 336-342-7796 1-800-772-1213 ? [place] Aging, Disability and Transit Services: Assists with nutrition, care and transit needs. 336-349-2343         

## 2016-10-30 NOTE — Patient Instructions (Addendum)
[facility] Discharge Instructions  RECOMMENDATIONS MADE BY THE CONSULTANT AND ANY TEST RESULTS WILL BE SENT TO YOUR REFERRING PHYSICIAN.  You were seen today by Mike Craze NP. Gretchen sent Gabapentin 100mg  tablets to your pharmacy take one tablet at bedtime. If this does not help you may take 2 tablets.  Continue treatments every 3 weeks. Return in 6 weeks for follow up and treatment.    Thank you for choosing [facility] at [facility] to provide your oncology and hematology care.  To afford each patient quality time with our provider, please arrive at least 15 minutes before your scheduled appointment time.    If you have a lab appointment with the [facility] please come in thru the  Main Entrance and check in at the main information desk  You need to re-schedule your appointment should you arrive 10 or more minutes late.  We strive to give you quality time with our providers, and arriving late affects you and other patients whose appointments are after yours.  Also, if you no show three or more times for appointments you may be dismissed from the clinic at the providers discretion.     Again, thank you for choosing [facility].  Our hope is that these requests will decrease the amount of time that you wait before being seen by our physicians.       _____________________________________________________________  Should you have questions after your visit to [facility] [employer], please contact our office at [phone] between the hours of 8:30 a.m. and 4:30 p.m.  Voicemails left after 4:30 p.m. will not be returned until the following business day.  For prescription refill requests, have your pharmacy contact our office.       Resources For Cancer Patients and their Caregivers ? American Cancer Society: Can assist with transportation, wigs, general needs, runs Look Good Feel Better.         623-213-3181 ? Cancer Care: Provides financial assistance, online support groups, medication/co-pay assistance.  1-800-813-HOPE (320)242-7318) ? [facility] Assists [place] cancer patients and their families through emotional , educational and financial support.  805-060-7071 ? [place] DSS Where to apply for food stamps, Medicaid and utility assistance. (239)344-9800 ? RCATS: Transportation to medical appointments. (684)126-8172 ? Social Security Administration: May apply for disability if have a Stage IV cancer. 603-071-1289 (380)056-0905 ? [employer], Disability and Transit Services: Assists with nutrition, care and transit needs. [place] [facility] Support Programs: @10RELATIVEDAYS @ > Cancer Support Group  2nd Tuesday of the month 1pm-2pm, Journey Room  > Creative Journey  3rd Tuesday of the month 1130am-1pm, Journey Room  > Look Good Feel Better  1st Wednesday of the month 10am-12 noon, Journey Room (Call [name] to register [phone])

## 2016-10-31 LAB — VITAMIN D 25 HYDROXY (VIT D DEFICIENCY, FRACTURES): Vit D, 25-Hydroxy: 27 ng/mL — ABNORMAL LOW (ref 30.0–100.0)

## 2016-11-18 ENCOUNTER — Ambulatory Visit (HOSPITAL_COMMUNITY)
Admission: RE | Admit: 2016-11-18 | Discharge: 2016-11-18 | Disposition: A | Discharge status: Home / Self Care | Payer: BLUE CROSS/BLUE SHIELD | Source: Ambulatory Visit | Attending: Adult Health | Admitting: Adult Health

## 2016-11-18 DIAGNOSIS — C50412 Malignant neoplasm of upper-outer quadrant of left female breast: Secondary | ICD-10-CM | POA: Diagnosis not present

## 2016-11-18 DIAGNOSIS — Z171 Estrogen receptor negative status [ER-]: Secondary | ICD-10-CM | POA: Diagnosis not present

## 2016-11-18 DIAGNOSIS — C50919 Malignant neoplasm of unspecified site of unspecified female breast: Secondary | ICD-10-CM

## 2016-11-18 DIAGNOSIS — I081 Rheumatic disorders of both mitral and tricuspid valves: Secondary | ICD-10-CM | POA: Diagnosis not present

## 2016-11-18 NOTE — Progress Notes (Signed)
*  PRELIMINARY RESULTS* Echocardiogram Complete Echo has been performed.  Leavy Cella 11/18/2016, 10:46 AM

## 2016-11-20 ENCOUNTER — Encounter (HOSPITAL_COMMUNITY): Payer: BLUE CROSS/BLUE SHIELD | Attending: Hematology & Oncology

## 2016-11-20 ENCOUNTER — Encounter (HOSPITAL_COMMUNITY): Payer: Self-pay

## 2016-11-20 VITALS — BP 125/66 | HR 66 | Temp 97.7°F | Resp 20 | Wt 157.4 lb

## 2016-11-20 DIAGNOSIS — Z5112 Encounter for antineoplastic immunotherapy: Secondary | ICD-10-CM | POA: Diagnosis not present

## 2016-11-20 DIAGNOSIS — C50412 Malignant neoplasm of upper-outer quadrant of left female breast: Secondary | ICD-10-CM

## 2016-11-20 DIAGNOSIS — Z171 Estrogen receptor negative status [ER-]: Secondary | ICD-10-CM

## 2016-11-20 MED ORDER — HEPARIN SOD (PORK) LOCK FLUSH 100 UNIT/ML IV SOLN
500.0000 [IU] | Freq: Once | INTRAVENOUS | Status: AC | PRN
  Administered 2016-11-20: 500 [IU]
  Filled 2016-11-20: qty 5

## 2016-11-20 MED ORDER — SODIUM CHLORIDE 0.9 % IV SOLN
450.0000 mg | Freq: Once | INTRAVENOUS | Status: AC
  Administered 2016-11-20: 450 mg via INTRAVENOUS
  Filled 2016-11-20: qty 21.43

## 2016-11-20 MED ORDER — TRASTUZUMAB CHEMO 150 MG IV SOLR: 6.0000 mg/kg | Freq: Once | INTRAVENOUS | Status: DC

## 2016-11-20 MED ORDER — ACETAMINOPHEN 325 MG PO TABS
650.0000 mg | ORAL_TABLET | Freq: Once | ORAL | Status: AC
  Administered 2016-11-20: 650 mg via ORAL
  Filled 2016-11-20: qty 2

## 2016-11-20 MED ORDER — SODIUM CHLORIDE 0.9% FLUSH
10.0000 mL | INTRAVENOUS | Status: DC | PRN
  Administered 2016-11-20: 10 mL
  Filled 2016-11-20: qty 10

## 2016-11-20 MED ORDER — DIPHENHYDRAMINE HCL 25 MG PO CAPS: 50.0000 mg | ORAL_CAPSULE | Freq: Once | ORAL | Status: DC

## 2016-11-20 MED ORDER — SODIUM CHLORIDE 0.9 % IV SOLN
Freq: Once | INTRAVENOUS | Status: AC
  Administered 2016-11-20: 10:00:00 via INTRAVENOUS

## 2016-11-20 NOTE — Patient Instructions (Signed)
[facility] Discharge Instructions for Patients Receiving Chemotherapy   Beginning January 23rd 2017 lab work for the [facility] will be done in the  Main lab at [facility] on 1st floor. If you have a lab appointment with the [facility] please come in thru the  Main Entrance and check in at the main information desk   Today you received the following chemotherapy agents: Herceptin  To help prevent nausea and vomiting after your treatment, we encourage you to take your nausea medication as prescribed.   If you develop nausea and vomiting, or diarrhea that is not controlled by your medication, call the clinic.  The clinic phone number is [phone]. Office hours are Monday-Friday 8:30am-5:00pm.  BELOW ARE SYMPTOMS THAT SHOULD BE REPORTED IMMEDIATELY:  *FEVER GREATER THAN 101.0 F  *CHILLS WITH OR WITHOUT FEVER  NAUSEA AND VOMITING THAT IS NOT CONTROLLED WITH YOUR NAUSEA MEDICATION  *UNUSUAL SHORTNESS OF BREATH  *UNUSUAL BRUISING OR BLEEDING  TENDERNESS IN MOUTH AND THROAT WITH OR WITHOUT PRESENCE OF ULCERS  *URINARY PROBLEMS  *BOWEL PROBLEMS  UNUSUAL RASH Items with * indicate a potential emergency and should be followed up as soon as possible. If you have an emergency after office hours please contact your primary care physician or go to the nearest emergency department.  Please call the clinic during office hours if you have any questions or concerns.   You may also contact the Patient Navigator at [phone] should you have any questions or need assistance in obtaining follow up care.      Resources For Cancer Patients and their Caregivers ? American Cancer Society: Can assist with transportation, wigs, general needs, runs Look Good Feel Better.        954-495-7843 ? Cancer Care: Provides financial assistance, online support groups, medication/co-pay assistance.  1-800-813-HOPE (671)875-6636) ? [facility] Assists  [place] cancer patients and their families through emotional , educational and financial support.  (218) 838-7855 ? [place] DSS Where to apply for food stamps, Medicaid and utility assistance. 6512227567 ? RCATS: Transportation to medical appointments. 336-871-4150 ? Social Security Administration: May apply for disability if have a Stage IV cancer. (234)456-9683 825-034-0561 ? [employer], Disability and Transit Services: Assists with nutrition, care and transit needs. ([phone]

## 2016-11-20 NOTE — Progress Notes (Signed)
Patient tolerated infusion today without incidence. Patient discharged ambulatory and in stable condition from clinic to self. Patient to follow up as scheduled.   

## 2016-12-01 DIAGNOSIS — Z9012 Acquired absence of left breast and nipple: Secondary | ICD-10-CM | POA: Diagnosis not present

## 2016-12-01 DIAGNOSIS — C50912 Malignant neoplasm of unspecified site of left female breast: Secondary | ICD-10-CM | POA: Diagnosis not present

## 2016-12-11 ENCOUNTER — Encounter (HOSPITAL_BASED_OUTPATIENT_CLINIC_OR_DEPARTMENT_OTHER): Payer: BLUE CROSS/BLUE SHIELD | Admitting: Oncology

## 2016-12-11 ENCOUNTER — Encounter (HOSPITAL_COMMUNITY): Payer: Self-pay | Admitting: Oncology

## 2016-12-11 ENCOUNTER — Encounter (HOSPITAL_COMMUNITY): Payer: BLUE CROSS/BLUE SHIELD | Attending: Hematology & Oncology

## 2016-12-11 VITALS — BP 121/66 | HR 64 | Temp 98.0°F | Resp 18 | Wt 155.0 lb

## 2016-12-11 DIAGNOSIS — Z171 Estrogen receptor negative status [ER-]: Secondary | ICD-10-CM | POA: Diagnosis not present

## 2016-12-11 DIAGNOSIS — K219 Gastro-esophageal reflux disease without esophagitis: Secondary | ICD-10-CM | POA: Diagnosis not present

## 2016-12-11 DIAGNOSIS — C50412 Malignant neoplasm of upper-outer quadrant of left female breast: Secondary | ICD-10-CM

## 2016-12-11 DIAGNOSIS — Z5112 Encounter for antineoplastic immunotherapy: Secondary | ICD-10-CM

## 2016-12-11 DIAGNOSIS — Z5111 Encounter for antineoplastic chemotherapy: Secondary | ICD-10-CM

## 2016-12-11 DIAGNOSIS — C50919 Malignant neoplasm of unspecified site of unspecified female breast: Secondary | ICD-10-CM

## 2016-12-11 MED ORDER — ACETAMINOPHEN 325 MG PO TABS: 650.0000 mg | ORAL_TABLET | Freq: Once | ORAL | Status: DC

## 2016-12-11 MED ORDER — DIPHENHYDRAMINE HCL 25 MG PO CAPS: 50.0000 mg | ORAL_CAPSULE | Freq: Once | ORAL | Status: DC

## 2016-12-11 MED ORDER — OMEPRAZOLE 40 MG PO CPDR: 40.0000 mg | DELAYED_RELEASE_CAPSULE | Freq: Two times a day (BID) | ORAL | 2 refills | Status: DC

## 2016-12-11 MED ORDER — METOPROLOL TARTRATE 25 MG PO TABS: 12.5000 mg | ORAL_TABLET | Freq: Two times a day (BID) | ORAL | 2 refills | Status: DC

## 2016-12-11 MED ORDER — TRASTUZUMAB CHEMO 150 MG IV SOLR
450.0000 mg | Freq: Once | INTRAVENOUS | Status: AC
  Administered 2016-12-11: 450 mg via INTRAVENOUS
  Filled 2016-12-11: qty 21.43

## 2016-12-11 MED ORDER — ALPRAZOLAM 0.5 MG PO TABS: 0.5000 mg | ORAL_TABLET | Freq: Three times a day (TID) | ORAL | 1 refills | Status: DC | PRN

## 2016-12-11 MED ORDER — SODIUM CHLORIDE 0.9 % IV SOLN
Freq: Once | INTRAVENOUS | Status: AC
  Administered 2016-12-11: 10:00:00 via INTRAVENOUS

## 2016-12-11 MED ORDER — HEPARIN SOD (PORK) LOCK FLUSH 100 UNIT/ML IV SOLN
500.0000 [IU] | Freq: Once | INTRAVENOUS | Status: AC | PRN
  Administered 2016-12-11: 500 [IU]
  Filled 2016-12-11: qty 5

## 2016-12-11 NOTE — Addendum Note (Signed)
Addended by: Farley Ly on: 12/11/2016 10:45 AM   Modules accepted: Orders

## 2016-12-11 NOTE — Patient Instructions (Addendum)
[facility] Discharge Instructions  RECOMMENDATIONS MADE BY THE CONSULTANT AND ANY TEST RESULTS WILL BE SENT TO YOUR REFERRING PHYSICIAN.  You were seen today by Dr. Janak Choksi Follow up as scheduled   Thank you for choosing [facility] at [facility] to provide your oncology and hematology care.  To afford each patient quality time with our provider, please arrive at least 15 minutes before your scheduled appointment time.    If you have a lab appointment with the Cancer Center please come in thru the  Main Entrance and check in at the main information desk  You need to re-schedule your appointment should you arrive 10 or more minutes late.  We strive to give you quality time with our providers, and arriving late affects you and other patients whose appointments are after yours.  Also, if you no show three or more times for appointments you may be dismissed from the clinic at the providers discretion.     Again, thank you for choosing [facility].  Our hope is that these requests will decrease the amount of time that you wait before being seen by our physicians.       _____________________________________________________________  Should you have questions after your visit to [facility], please contact our office at [phone] between the hours of 8:30 a.m. and 4:30 p.m.  Voicemails left after 4:30 p.m. will not be returned until the following business day.  For prescription refill requests, have your pharmacy contact our office.       Resources For Cancer Patients and their Caregivers ? American Cancer Society: Can assist with transportation, wigs, general needs, runs Look Good Feel Better.        1-888-227-6333 ? Cancer Care: Provides financial assistance, online support groups, medication/co-pay assistance.  1-800-813-HOPE (4673) ? [facility] Assists [place]  cancer patients and their families through emotional , educational and financial support.  336-427-4357 ? [place] DSS Where to apply for food stamps, Medicaid and utility assistance. 336-342-1394 ? [facility]: Transportation to medical appointments. 336-347-2287 ? Social Security Administration: May apply for disability if have a Stage IV cancer. 336-342-7796 1-800-772-1213 ? [place] Aging, Disability and Transit Services: Assists with nutrition, care and transit needs. 336-349-2343  Cancer Center Support Programs: @[email or web address]@ > Cancer Support Group  2nd Tuesday of the month 1pm-2pm, Journey Room  > Creative Journey  3rd Tuesday of the month 1130am-1pm, Journey Room  > Look Good Feel Better  1st Wednesday of the month 10am-12 noon, Journey Room (Call American Cancer Society to register 1-800-395-5775)    

## 2016-12-11 NOTE — Progress Notes (Signed)
[name], [place] [address]  No diagnosis found.  CURRENT THERAPY: Carboplatin/Docetaxel/Herceptin beginning on 01/31/2016.  INTERVAL HISTORY: [name] 61 y.o. female returns for followup of Stage IA (T1CN0M0) left invasive ductal carcinoma of upper outer quadrant, ER/PR negative, HER2 POSITIVE.  S/P simple left mastectomy by Dr. Georgette Dover on 01/07/2016.   He should not has finished [facility] therapy and now on Herceptin.  Tolerating treatment very well.  No shortness of breath.  Appetite has been stable. No swelling of lower extremity   Breast cancer of upper-outer quadrant of left female breast ([place])   11/07/1999 Surgery    Lumpectomy/sentinel LN biopsy invasive ductal carcinoma and DCIS at [facility], 0/6 sentinel LN      11/10/1999 - 01/08/2005 Anti-estrogen oral therapy    Tamoxifen 20 mg daily      11/24/1999 - 01/02/2000 Radiation Therapy         12/18/2015 Mammogram    2D digital screening bilateral mammogram with CAD and adjunct TOMO, in the L breast a possible mass warrants further evaluation. In R breast no findings suspicious for malignancy      12/24/2015 Pathology Results    Invasive ductal carcinoma Grade 3 Left core needle biopsy 1:30 o clock ER- PR- Ki67 70%, HER 2 positive      12/24/2015 Imaging    L breast Ultrasound, notes palpable area of concern demonstrates a hypoechoic irregular mass at 1:30, 7 cm from the nipple measuring 1.3 x 0.9 x 1.4 cm. No evidence of L axillary LAD      01/07/2016 Procedure    Left simple mastectomy by Dr. Georgette Dover      01/08/2016 Pathology Results    Breast, simple mastectomy, Left - INVASIVE DUCTAL CARCINOMA, GRADE III/III, SPANNING 1.4 CM. - THE SURGICAL RESECTION MARGINS ARE NEGATIVE FOR CARCINOMA.      01/23/2016 Genetic Testing    Patient refused genetic testing at time of consultation with Roma Kayser.  "Despite our recommendation, Ms. Ballengee did not wish to pursue genetic testing at  today's visit."      01/28/2016 Echocardiogram    Left ventricle: The cavity size was normal. Wall thickness was normal. Systolic function was normal. The estimated ejection fraction was in the range of 55% to 60%. Wall motion was normal; there were no regional wall motion abnormalities. Doppler parameters are consistent with abnormal left ventricular relaxation (grade 1 diastolic dysfunction).      01/31/2016 - 05/15/2016 Chemotherapy    The patient had palonosetron (ALOXI) injection 0.25 mg, 0.25 mg, Intravenous,  Once, 1 of 6 cycles  pegfilgrastim (NEULASTA ONPRO KIT) injection 6 mg, 6 mg, Subcutaneous, Once, 1 of 6 cycles  CARBOplatin (PARAPLATIN) 560 mg in sodium chloride 0.9 % 250 mL chemo infusion, 560 mg (100 % of original dose 558.5 mg), Intravenous,  Once, 1 of 6 cycles Dose modification:   (original dose 558.5 mg, Cycle 1),   (original dose 558.5 mg, Cycle 2)  DOCEtaxel (TAXOTERE) 140 mg in dextrose 5 % 250 mL chemo infusion, 75 mg/m2 = 140 mg, Intravenous,  Once, 1 of 6 cycles  for chemotherapy treatment.        01/31/2016 -  Antibody Plan    Herceptin x 52 weeks      08/24/2016 Echocardiogram    Left ventricle: The cavity size was normal. Wall thickness was normal. Systolic function was normal. The estimated ejection fraction was in the range of 55% to 60%. Wall  motion was normal; there were no regional wall motion abnormalities. Doppler parameters are consistent with abnormal left ventricular relaxation (grade 1 diastolic dysfunction).      11/18/2016 Echocardiogram    LV EF: 55% -  60%. - Left ventricle: The cavity size was normal. Wall thickness was   normal. Systolic function was normal. The estimated ejection   fraction was in the range of 55% to 60%. Wall motion was normal;   there were no regional wall motion abnormalities. Left   ventricular diastolic function parameters were normal. - Mitral valve: There was mild regurgitation. -  Tricuspid valve: There was mild regurgitation.         Review of Systems  Constitutional: Negative.  Negative for chills, fever and weight loss.  HENT: Negative.   Eyes: Positive for discharge. Negative for pain and redness.  Respiratory: Negative.  Negative for cough and sputum production.   Cardiovascular: Positive for palpitations. Negative for chest pain and leg swelling.  Gastrointestinal: Negative.  Negative for constipation, diarrhea, nausea and vomiting.  Musculoskeletal: Negative.   Skin: Negative.  Negative for rash.  Neurological: Negative.  Negative for weakness.  Endo/Heme/Allergies: Negative.   Psychiatric/Behavioral: Negative.     Past Medical History:  Diagnosis Date  . Anxiety   . Breast cancer ([place])   . Breast cancer of upper-outer quadrant of left female breast ([place]) 01/07/2016  . Complication of anesthesia   . DDD (degenerative disc disease), cervical   . DDD (degenerative disc disease), lumbar   . Depression   . Dysrhythmia    last cardiology ov 06-13-2013 in [facility], takes metoprolol for HR  . Family history of breast cancer   . Family history of colon cancer   . Fibromyalgia   . GERD (gastroesophageal reflux disease)   . Hematuria   . History of echocardiogram 2015   mild mital insufficiency, otherwise normal  . Leaky heart valve   . Multiple thyroid nodules   . PAC (premature atrial contraction)   . Palpitations   . PONV (postoperative nausea and vomiting)   . SOB (shortness of breath)   . Tachycardia     Past Surgical History:  Procedure Laterality Date  . BREAST LUMPECTOMY    . LAPAROSCOPIC APPENDECTOMY N/A 11/10/2012   Procedure: APPENDECTOMY LAPAROSCOPIC;  Surgeon: Jamesetta So, MD;  Location: [facility];  Service: General;  Laterality: N/A;  . MASTECTOMY    . MASTECTOMY W/ SENTINEL NODE BIOPSY Left 01/07/2016  . MASTECTOMY W/ SENTINEL NODE BIOPSY Left 01/07/2016   Procedure: LEFT TOTAL MASTECTOMY WITH LEFT AXILLARY SENTINEL LYMPH NODE BIOPSY;   Surgeon: Donnie Mesa, MD;  Location: [facility];  Service: General;  Laterality: Left;  . PORTACATH PLACEMENT Right 01/07/2016   IJ  . PORTACATH PLACEMENT Right 01/07/2016   Procedure: INSERTION PORT-A-CATH RIGHT INTERNAL JUGULAR WITH ULTRASOUND;  Surgeon: Donnie Mesa, MD;  Location: [facility];  Service: General;  Laterality: Right;    Family History  Problem Relation Age of Onset  . Cancer Mother        colon  . Diabetes Father   . Diabetes Sister   . Alcohol abuse Brother   . Cancer Brother 79       lung cancer  . Cancer Brother        penile  . Breast cancer Maternal Aunt   . Cancer Other        unknown form of cancer    Social History   Social History  . Marital status: Married  Spouse name: N/A  . Number of children: 2  . Years of education: N/A   Social History Main Topics  . Smoking status: Never Smoker  . Smokeless tobacco: Never Used  . Alcohol use No  . Drug use: No  . Sexual activity: Yes     Comment: married   Other Topics Concern  . Not on file   Social History Narrative  . No narrative on file     PHYSICAL EXAMINATION  ECOG PERFORMANCE STATUS: 1 - Symptomatic but completely ambulatory  Blood pressure: 1 1 2/64 Pulse 70 Respiration 18 temperature 98.2 Oxygen saturation 100%  GENERAL:alert, well nourished, well developed, comfortable, cooperative, smiling and accompanied by husband SKIN: skin color, texture, turgor are normal, no rashes or significant lesions HEAD: Normocephalic, No masses, lesions, tenderness or abnormalities EYES: normal, EOMI, Conjunctiva are pink and non-injected, clear watery discharge noted. EARS: External ears normal OROPHARYNX:lips, buccal mucosa, and tongue normal and mucous membranes are moist  NECK: supple, trachea midline LYMPH:  not examined BREAST:not examined LUNGS: clear to auscultation  HEART: regular rate & rhythm, no murmurs and no gallops ABDOMEN:abdomen soft and normal bowel sounds BACK: Back symmetric, no  curvature. EXTREMITIES:less then 2 second capillary refill, no joint deformities, effusion, or inflammation, no skin discoloration  NEURO: alert & oriented x 3 with fluent speech, no focal motor/sensory deficits, gait normal   LABORATORY DATA: CBC    Component Value Date/Time   WBC 4.5 10/30/2016 0943   RBC 4.22 10/30/2016 0943   HGB 13.2 10/30/2016 0943   HCT 39.5 10/30/2016 0943   PLT 220 10/30/2016 0943   MCV 93.6 10/30/2016 0943   MCH 31.3 10/30/2016 0943   MCHC 33.4 10/30/2016 0943   RDW 13.0 10/30/2016 0943   LYMPHSABS 2.3 10/30/2016 0943   MONOABS 0.4 10/30/2016 0943   EOSABS 0.2 10/30/2016 0943   BASOSABS 0.0 10/30/2016 0943      Chemistry      Component Value Date/Time   NA 134 (L) 10/30/2016 0943   K 4.1 10/30/2016 0943   CL 102 10/30/2016 0943   CO2 25 10/30/2016 0943   BUN 15 10/30/2016 0943   CREATININE 0.72 10/30/2016 0943      Component Value Date/Time   CALCIUM 9.2 10/30/2016 0943   ALKPHOS 72 10/30/2016 0943   AST 27 10/30/2016 0943   ALT 28 10/30/2016 0943   BILITOT 0.7 10/30/2016 0943        PENDING LABS: Lab has not been ordered today  RADIOGRAPHIC STUDIES:  MUGA scan of July 18 has been reviewed  PATHOLOGY:    ASSESSMENT AND PLAN:  Carcinoma of breast  Stage IC  Status post simple mastectomy has finished TCS therapy now on Herceptin  Last MUGA scan of the heart was in July of 2018 with ejection fraction of 55%  ORDERS PLACED FOR THIS ENCOUNTER: Continue Herceptin  MEDICATIONS PRESCRIBED THIS ENCOUNTER: No orders of the defined types were placed in this encounter.   THERAPY PLAN:  Complete 6 cycle of TC and continue with Herceptin x 52 weeks.  All questions were answered. The patient knows to call the clinic with any problems, questions or concerns. We can certainly see the patient much sooner if necessary.    This note is electronically signed by: Forest Gleason, MD 12/11/2016 9:23 AM

## 2016-12-11 NOTE — Progress Notes (Signed)
Tolerated infusion w/o adverse reaction.  Alert, in no distress.  VSS.  Discharged ambulatory.  

## 2016-12-17 ENCOUNTER — Ambulatory Visit
Admission: RE | Admit: 2016-12-17 | Discharge: 2016-12-17 | Disposition: A | Discharge status: Home / Self Care | Payer: BLUE CROSS/BLUE SHIELD | Source: Ambulatory Visit | Attending: Adult Health | Admitting: Adult Health

## 2016-12-17 DIAGNOSIS — Z1231 Encounter for screening mammogram for malignant neoplasm of breast: Secondary | ICD-10-CM

## 2016-12-28 ENCOUNTER — Ambulatory Visit (HOSPITAL_COMMUNITY): Payer: BLUE CROSS/BLUE SHIELD

## 2016-12-29 ENCOUNTER — Other Ambulatory Visit (HOSPITAL_COMMUNITY): Payer: Self-pay | Admitting: Oncology

## 2017-01-01 ENCOUNTER — Encounter (HOSPITAL_COMMUNITY): Payer: Self-pay

## 2017-01-01 ENCOUNTER — Encounter (HOSPITAL_BASED_OUTPATIENT_CLINIC_OR_DEPARTMENT_OTHER): Payer: BLUE CROSS/BLUE SHIELD

## 2017-01-01 ENCOUNTER — Encounter (HOSPITAL_COMMUNITY): Payer: BLUE CROSS/BLUE SHIELD | Attending: Adult Health | Admitting: Adult Health

## 2017-01-01 VITALS — BP 119/60 | HR 70 | Temp 98.0°F | Resp 16 | Wt 157.0 lb

## 2017-01-01 DIAGNOSIS — Z171 Estrogen receptor negative status [ER-]: Secondary | ICD-10-CM

## 2017-01-01 DIAGNOSIS — Z5112 Encounter for antineoplastic immunotherapy: Secondary | ICD-10-CM | POA: Diagnosis not present

## 2017-01-01 DIAGNOSIS — C50412 Malignant neoplasm of upper-outer quadrant of left female breast: Secondary | ICD-10-CM

## 2017-01-01 DIAGNOSIS — C50919 Malignant neoplasm of unspecified site of unspecified female breast: Secondary | ICD-10-CM

## 2017-01-01 DIAGNOSIS — Z853 Personal history of malignant neoplasm of breast: Secondary | ICD-10-CM

## 2017-01-01 LAB — COMPREHENSIVE METABOLIC PANEL
ALBUMIN: 4 g/dL (ref 3.5–5.0)
ALK PHOS: 75 U/L (ref 38–126)
ALT: 22 U/L (ref 14–54)
ANION GAP: 5 (ref 5–15)
AST: 23 U/L (ref 15–41)
BILIRUBIN TOTAL: 0.6 mg/dL (ref 0.3–1.2)
BUN: 17 mg/dL (ref 6–20)
CALCIUM: 9.1 mg/dL (ref 8.9–10.3)
CO2: 26 mmol/L (ref 22–32)
Chloride: 107 mmol/L (ref 101–111)
Creatinine, Ser: 0.68 mg/dL (ref 0.44–1.00)
GFR calc Af Amer: >60 mL/min (ref >60)
GLUCOSE: 97 mg/dL (ref 65–99)
POTASSIUM: 4 mmol/L (ref 3.5–5.1)
Sodium: 138 mmol/L (ref 135–145)
TOTAL PROTEIN: 7 g/dL (ref 6.5–8.1)

## 2017-01-01 LAB — CBC WITH DIFFERENTIAL/PLATELET
BASOS ABS: 0 10*3/uL (ref 0.0–0.1)
Basophils Relative: 1 %
Eosinophils Absolute: 0.2 10*3/uL (ref 0.0–0.7)
Eosinophils Relative: 4 %
HCT: 40.4 % (ref 36.0–46.0)
Hemoglobin: 13.5 g/dL (ref 12.0–15.0)
LYMPHS PCT: 52 %
Lymphs Abs: 2.3 10*3/uL (ref 0.7–4.0)
MCH: 31.6 pg (ref 26.0–34.0)
MCHC: 33.4 g/dL (ref 30.0–36.0)
MCV: 94.6 fL (ref 78.0–100.0)
Monocytes Absolute: 0.4 10*3/uL (ref 0.1–1.0)
Monocytes Relative: 8 %
Neutro Abs: 1.5 10*3/uL — ABNORMAL LOW (ref 1.7–7.7)
Neutrophils Relative %: 35 %
PLATELETS: 221 10*3/uL (ref 150–400)
RBC: 4.27 MIL/uL (ref 3.87–5.11)
RDW: 12.6 % (ref 11.5–15.5)
WBC: 4.4 10*3/uL (ref 4.0–10.5)

## 2017-01-01 MED ORDER — DIPHENHYDRAMINE HCL 25 MG PO CAPS: 50.0000 mg | ORAL_CAPSULE | Freq: Once | ORAL | Status: DC

## 2017-01-01 MED ORDER — SODIUM CHLORIDE 0.9 % IV SOLN
450.0000 mg | Freq: Once | INTRAVENOUS | Status: AC
  Administered 2017-01-01: 450 mg via INTRAVENOUS
  Filled 2017-01-01: qty 21.4

## 2017-01-01 MED ORDER — OMEPRAZOLE 20 MG PO CPDR: 20.0000 mg | DELAYED_RELEASE_CAPSULE | Freq: Two times a day (BID) | ORAL | 6 refills | Status: DC

## 2017-01-01 MED ORDER — HEPARIN SOD (PORK) LOCK FLUSH 100 UNIT/ML IV SOLN
500.0000 [IU] | Freq: Once | INTRAVENOUS | Status: AC | PRN
  Administered 2017-01-01: 500 [IU]
  Filled 2017-01-01: qty 5

## 2017-01-01 MED ORDER — SODIUM CHLORIDE 0.9 % IV SOLN
Freq: Once | INTRAVENOUS | Status: AC
  Administered 2017-01-01: 500 mL via INTRAVENOUS

## 2017-01-01 MED ORDER — ACETAMINOPHEN 325 MG PO TABS: 650.0000 mg | ORAL_TABLET | Freq: Once | ORAL | Status: DC

## 2017-01-01 MED ORDER — SODIUM CHLORIDE 0.9% FLUSH
10.0000 mL | INTRAVENOUS | Status: DC | PRN
  Administered 2017-01-01: 10 mL
  Filled 2017-01-01: qty 10

## 2017-01-01 NOTE — Patient Instructions (Addendum)
[facility] Discharge Instructions for Patients Receiving Chemotherapy  Today you received the following chemotherapy agents Herceptin.  Keep scheduled appointments and call for any questions and concerns.   You saw Mike Craze, today.    Return in November for a visit before port removal in December.  We will flush your port that day.    Pick up prescription for indigestion and call if worsening symptoms.   To help prevent nausea and vomiting after your treatment, we encourage you to take your nausea medication.    If you develop nausea and vomiting that is not controlled by your nausea medication, call the clinic.   BELOW ARE SYMPTOMS THAT SHOULD BE REPORTED IMMEDIATELY:  *FEVER GREATER THAN 100.5 F  *CHILLS WITH OR WITHOUT FEVER  NAUSEA AND VOMITING THAT IS NOT CONTROLLED WITH YOUR NAUSEA MEDICATION  *UNUSUAL SHORTNESS OF BREATH  *UNUSUAL BRUISING OR BLEEDING  TENDERNESS IN MOUTH AND THROAT WITH OR WITHOUT PRESENCE OF ULCERS  *URINARY PROBLEMS  *BOWEL PROBLEMS  UNUSUAL RASH Items with * indicate a potential emergency and should be followed up as soon as possible.  Feel free to call the clinic you have any questions or concerns. The clinic phone number is [phone].  Please show the [place] at check-in to the Emergency Department and triage nurse.

## 2017-01-01 NOTE — Progress Notes (Signed)
[name], [place] [address]   CLINIC:  Medical Oncology/Hematology  PCP:  Sharilyn Sites, [place] [address] [phone]   REASON FOR VISIT:  Follow-up for Stage IA left breast invasive ductal carcinoma; ER-/PR-/HER2+  CURRENT THERAPY: Maintenance Herceptin every 21 days, beginning 01/31/16   BRIEF ONCOLOGIC HISTORY:    Breast cancer of upper-outer quadrant of left female breast ([place])   11/07/1999 Surgery    Lumpectomy/sentinel LN biopsy invasive ductal carcinoma and DCIS at [facility], 0/6 sentinel LN      11/10/1999 - 01/08/2005 Anti-estrogen oral therapy    Tamoxifen 20 mg daily      11/24/1999 - 01/02/2000 Radiation Therapy         12/18/2015 Mammogram    2D digital screening bilateral mammogram with CAD and adjunct TOMO, in the L breast a possible mass warrants further evaluation. In R breast no findings suspicious for malignancy      12/24/2015 Pathology Results    Invasive ductal carcinoma Grade 3 Left core needle biopsy 1:30 o clock ER- PR- Ki67 70%, HER 2 positive      12/24/2015 Imaging    L breast Ultrasound, notes palpable area of concern demonstrates a hypoechoic irregular mass at 1:30, 7 cm from the nipple measuring 1.3 x 0.9 x 1.4 cm. No evidence of L axillary LAD      01/07/2016 Procedure    Left simple mastectomy by Dr. Georgette Dover      01/08/2016 Pathology Results    Breast, simple mastectomy, Left - INVASIVE DUCTAL CARCINOMA, GRADE III/III, SPANNING 1.4 CM. - THE SURGICAL RESECTION MARGINS ARE NEGATIVE FOR CARCINOMA.      01/23/2016 Genetic Testing    Patient refused genetic testing at time of consultation with Roma Kayser.  "Despite our recommendation, Ms. Spatz did not wish to pursue genetic testing at today's visit."      01/28/2016 Echocardiogram    Left ventricle: The cavity size was normal. Wall thickness was normal. Systolic function was normal. The estimated  ejection fraction was in the range of 55% to 60%. Wall motion was normal; there were no regional wall motion abnormalities. Doppler parameters are consistent with abnormal left ventricular relaxation (grade 1 diastolic dysfunction).      01/31/2016 - 05/15/2016 Chemotherapy    The patient had palonosetron (ALOXI) injection 0.25 mg, 0.25 mg, Intravenous,  Once, 1 of 6 cycles  pegfilgrastim (NEULASTA ONPRO KIT) injection 6 mg, 6 mg, Subcutaneous, Once, 1 of 6 cycles  CARBOplatin (PARAPLATIN) 560 mg in sodium chloride 0.9 % 250 mL chemo infusion, 560 mg (100 % of original dose 558.5 mg), Intravenous,  Once, 1 of 6 cycles Dose modification:   (original dose 558.5 mg, Cycle 1),   (original dose 558.5 mg, Cycle 2)  DOCEtaxel (TAXOTERE) 140 mg in dextrose 5 % 250 mL chemo infusion, 75 mg/m2 = 140 mg, Intravenous,  Once, 1 of 6 cycles  for chemotherapy treatment.        01/31/2016 -  Antibody Plan    Herceptin x 52 weeks      08/24/2016 Echocardiogram    Left ventricle: The cavity size was normal. Wall thickness was normal. Systolic function was normal. The estimated ejection fraction was in the range of 55% to 60%. Wall motion was normal; there were no regional wall motion abnormalities. Doppler parameters are consistent with abnormal left ventricular relaxation (grade 1 diastolic dysfunction).      11/18/2016 Echocardiogram    LV  EF: 55% -  60%. - Left ventricle: The cavity size was normal. Wall thickness was   normal. Systolic function was normal. The estimated ejection   fraction was in the range of 55% to 60%. Wall motion was normal;   there were no regional wall motion abnormalities. Left   ventricular diastolic function parameters were normal. - Mitral valve: There was mild regurgitation. - Tricuspid valve: There was mild regurgitation.        INTERVAL HISTORY:  [name] is here for follow-up and consideration for next cycle of maintenance Herceptin  therapy.   Due for last dose of Herceptin today; cycle #17.   Overall, she tells me that she feels great. Appetite and energy levels are good. She is "just ready to be done with this!"  She is excited about today being her last dose of maintenance Herceptin.   She had right breast screening mammogram at the [facility] on 12/17/16 and negative for malignancy.  Last ECHO 11/18/16 with EF 55-60%.   She is requesting lower dose of Prilosec; she does not like taking the 40 mg dose. She prefers 20 mg. Generally she only takes dose once per day, but does take BID when her GERD symptoms are bad, which is not often.  Her peripheral neuropathy from chemo is improving.  She never tried any of the gabapentin for her hot flashes at bedtime; "this is something I've dealt with so long and I don't like to take pills."   She wants to know when she can get her port-a-cath removed.      REVIEW OF SYSTEMS:  Review of Systems  Constitutional: Negative.  Negative for chills, fatigue and fever.  HENT:  Negative.  Negative for lump/mass and nosebleeds.   Eyes: Negative.   Respiratory: Negative.  Negative for cough and shortness of breath.   Cardiovascular: Negative.  Negative for chest pain and leg swelling.  Gastrointestinal: Negative for abdominal pain, blood in stool, constipation, diarrhea, nausea and vomiting.       Occasional heartburn   Endocrine: Positive for hot flashes.  Genitourinary: Negative.  Negative for dysuria and hematuria.   Musculoskeletal: Negative.  Negative for arthralgias.  Skin: Negative.  Negative for rash.  Neurological: Positive for numbness. Negative for dizziness and headaches.  Hematological: Negative.  Negative for adenopathy. Does not bruise/bleed easily.  Psychiatric/Behavioral: Positive for sleep disturbance. Negative for depression. The patient is not nervous/anxious.      PAST MEDICAL/SURGICAL HISTORY:  Past Medical History:  Diagnosis Date  . Anxiety    . Breast cancer ([place])   . Breast cancer of upper-outer quadrant of left female breast ([place]) 01/07/2016  . Complication of anesthesia   . DDD (degenerative disc disease), cervical   . DDD (degenerative disc disease), lumbar   . Depression   . Dysrhythmia    last cardiology ov 06-13-2013 in [facility], takes metoprolol for HR  . Family history of breast cancer   . Family history of colon cancer   . Fibromyalgia   . GERD (gastroesophageal reflux disease)   . Hematuria   . History of echocardiogram 2015   mild mital insufficiency, otherwise normal  . Leaky heart valve   . Multiple thyroid nodules   . PAC (premature atrial contraction)   . Palpitations   . PONV (postoperative nausea and vomiting)   . SOB (shortness of breath)   . Tachycardia    Past Surgical History:  Procedure Laterality Date  . BREAST LUMPECTOMY    .  LAPAROSCOPIC APPENDECTOMY N/A 11/10/2012   Procedure: APPENDECTOMY LAPAROSCOPIC;  Surgeon: Jamesetta So, MD;  Location: [facility];  Service: General;  Laterality: N/A;  . MASTECTOMY Left 01/2016  . MASTECTOMY W/ SENTINEL NODE BIOPSY Left 01/07/2016  . MASTECTOMY W/ SENTINEL NODE BIOPSY Left 01/07/2016   Procedure: LEFT TOTAL MASTECTOMY WITH LEFT AXILLARY SENTINEL LYMPH NODE BIOPSY;  Surgeon: Donnie Mesa, MD;  Location: [facility];  Service: General;  Laterality: Left;  . PORTACATH PLACEMENT Right 01/07/2016   IJ  . PORTACATH PLACEMENT Right 01/07/2016   Procedure: INSERTION PORT-A-CATH RIGHT INTERNAL JUGULAR WITH ULTRASOUND;  Surgeon: Donnie Mesa, MD;  Location: [facility];  Service: General;  Laterality: Right;     SOCIAL HISTORY:  Social History   Social History  . Marital status: Married    Spouse name: N/A  . Number of children: 2  . Years of education: N/A   Occupational History  . Not on file.   Social History Main Topics  . Smoking status: Never Smoker  . Smokeless tobacco: Never Used  . Alcohol use No  . Drug use: No  . Sexual activity: Yes     Comment: married    Other Topics Concern  . Not on file   Social History Narrative  . No narrative on file    FAMILY HISTORY:  Family History  Problem Relation Age of Onset  . Cancer Mother        colon  . Diabetes Father   . Diabetes Sister   . Alcohol abuse Brother   . Cancer Brother 35       lung cancer  . Cancer Brother        penile  . Breast cancer Maternal Aunt   . Cancer Other        unknown form of cancer    CURRENT MEDICATIONS:  Outpatient Encounter Prescriptions as of 01/01/2017  Medication Sig  . acetaminophen (TYLENOL) 500 MG tablet Take 500 mg by mouth every 6 (six) hours as needed for mild pain.  Marland Kitchen ALPRAZolam (XANAX) 0.5 MG tablet Take 1 tablet (0.5 mg total) by mouth 3 (three) times daily as needed for anxiety.  Marland Kitchen antiseptic oral rinse (BIOTENE) LIQD 15 mLs by Mouth Rinse route as needed for dry mouth.  . Cholecalciferol (VITAMIN D3) 1000 units CAPS Take 1,000 Units by mouth every morning.  . gabapentin (NEURONTIN) 100 MG capsule Take 1 capsule (100 mg total) by mouth at bedtime.  . meclizine (ANTIVERT) 25 MG tablet Take 1 tablet (25 mg total) by mouth 3 (three) times daily as needed for dizziness.  . metoprolol tartrate (LOPRESSOR) 25 MG tablet Take 0.5-1 tablets (12.5-25 mg total) by mouth 2 (two) times daily. 25 mg in the morning. 12.5 mg in the evening,  . omeprazole (PRILOSEC) 20 MG capsule Take 1 capsule (20 mg total) by mouth 2 (two) times daily.  Marland Kitchen oxyCODONE (OXY IR/ROXICODONE) 5 MG immediate release tablet Take 1-2 tablets (5-10 mg total) by mouth every 4 (four) hours as needed for moderate pain.  Vladimir Faster Glycol-Propyl Glycol (SYSTANE) 0.4-0.3 % SOLN Apply 1 drop to eye at bedtime.  . Trastuzumab (HERCEPTIN IV) Inject into the vein. Every 3 weeks  . [DISCONTINUED] omeprazole (PRILOSEC) 40 MG capsule Take 1 capsule (40 mg total) by mouth 2 (two) times daily.   Facility-Administered Encounter Medications as of 01/01/2017  Medication  . [COMPLETED] 0.9 %  sodium  chloride infusion  . [COMPLETED] heparin lock flush 100 unit/mL  . sodium chloride flush (  NS) 0.9 % injection 10 mL  . [COMPLETED] trastuzumab (HERCEPTIN) 450 mg in sodium chloride 0.9 % 250 mL chemo infusion  . [DISCONTINUED] acetaminophen (TYLENOL) tablet 650 mg  . [DISCONTINUED] diphenhydrAMINE (BENADRYL) capsule 50 mg  . [DISCONTINUED] sodium chloride flush (NS) 0.9 % injection 10 mL    ALLERGIES:  Allergies  Allergen Reactions  . Latex Itching  . Naprosyn [Naproxen] Other (See Comments)    UNSPECIFIED REACTION   . Other     USE PAPER TAPE ONLY  . Codeine Nausea And Vomiting     PHYSICAL EXAM:  ECOG Performance status: 1 - Symptomatic, but largely independent      Physical Exam  Constitutional: She is oriented to person, place, and time and well-developed, well-nourished, and in no distress.  Seen in chemo chair in infusion area   HENT:  Head: Normocephalic.  Mouth/Throat: Oropharynx is clear and moist.  Eyes: Conjunctivae are normal. No scleral icterus.  Neck: Normal range of motion. Neck supple.  Cardiovascular: Normal rate and regular rhythm.   Pulmonary/Chest: Effort normal and breath sounds normal. No respiratory distress.  Abdominal: Soft. Bowel sounds are normal. There is no tenderness.  Musculoskeletal: Normal range of motion. She exhibits no edema.  Lymphadenopathy:    She has no cervical adenopathy.       Right: No supraclavicular adenopathy present.       Left: No supraclavicular adenopathy present.  Neurological: She is alert and oriented to person, place, and time. No cranial nerve deficit.  Skin: Skin is warm and dry. No rash noted.  Psychiatric: Mood, memory, affect and judgment normal.  Nursing note and vitals reviewed.    LABORATORY DATA:  I have reviewed the labs as listed.  CBC    Component Value Date/Time   WBC 4.4 01/01/2017 0828   RBC 4.27 01/01/2017 0828   HGB 13.5 01/01/2017 0828   HCT 40.4 01/01/2017 0828   PLT 221 01/01/2017  0828   MCV 94.6 01/01/2017 0828   MCH 31.6 01/01/2017 0828   MCHC 33.4 01/01/2017 0828   RDW 12.6 01/01/2017 0828   LYMPHSABS 2.3 01/01/2017 0828   MONOABS 0.4 01/01/2017 0828   EOSABS 0.2 01/01/2017 0828   BASOSABS 0.0 01/01/2017 0828   CMP Latest Ref Rng & Units 01/01/2017 10/30/2016 09/18/2016  Glucose 65 - 99 mg/dL 97 95 118(H)  BUN 6 - 20 mg/dL _0 Creatinine 0.44 - 1.00 mg/dL 0.68 0.72 0.67  Sodium 135 - 145 mmol/L 138 134(L) 137  Potassium 3.5 - 5.1 mmol/L 4.0 4.1 4.0  Chloride 101 - 111 mmol/L 107 102 105  CO2 22 - 32 mmol/L _1 Calcium 8.9 - 10.3 mg/dL 9.1 9.2 9.0  Total Protein 6.5 - 8.1 g/dL 7.0 6.9 6.9  Total Bilirubin 0.3 - 1.2 mg/dL 0.6 0.7 0.7  Alkaline Phos 38 - 126 U/L 75 72 67  AST 15 - 41 U/L _2 ALT 14 - 54 U/L _3 PENDING LABS:    DIAGNOSTIC IMAGING:  Most recent ECHO: 11/18/16                     *[address]  [place], [place] [address]                            [phone]  ------------------------------------------------------------------- Transthoracic Echocardiography  Patient:    [name], [name] MR #:       [id number] Study Date: 11/18/2016 Gender:     F Age:        61 Height:     167.6 cm Weight:     69.6 kg BSA:        1.81 m^2 Pt. Status: Room:   SONOGRAPHER  [facility]  PERFORMING   [facility], Forestine Na  ATTENDING    Laurelyn Sickle, Elzie Rings W  REFERRING    Mike Craze W  cc:  ------------------------------------------------------------------- LV EF: 55% -   60%  ------------------------------------------------------------------- History:   PMH:  Palpitations, Breast Cancer, Mild Mitral Insufficency. Breast Cancer.  ------------------------------------------------------------------- Study Conclusions  - Left ventricle: The cavity size was normal. Wall thickness was   normal.  Systolic function was normal. The estimated ejection   fraction was in the range of 55% to 60%. Wall motion was normal;   there were no regional wall motion abnormalities. Left   ventricular diastolic function parameters were normal. - Mitral valve: There was mild regurgitation. - Tricuspid valve: There was mild regurgitation.    (R) breast screening mammogram: 12/17/16 CLINICAL DATA:  Screening.  EXAM: 2D DIGITAL SCREENING UNILATERAL RIGHT MAMMOGRAM WITH CAD AND ADJUNCT TOMO  COMPARISON:  Previous exam(s).  ACR Breast Density Category b: There are scattered areas of fibroglandular density.  FINDINGS: There are no findings suspicious for malignancy. Images were processed with CAD.  IMPRESSION: No mammographic evidence of malignancy. A result letter of this screening mammogram will be mailed directly to the patient.  RECOMMENDATION: Screening mammogram in one year. (Code:[id number]-B-01Y)  BI-RADS CATEGORY  1: Negative.   Electronically Signed   By: Curlene Dolphin M.D.   On: 12/18/2016 08:03     PATHOLOGY:  Surgical path: 01/07/16           ASSESSMENT & PLAN:   Stage IA left breast invasive ductal carcinoma, ER-/PR-/HER2+: -s/p mastectomy and Taxotere/Carbo/Herceptin x 6 cycles; maintenance Herceptin every 3 weeks to total 1 year of therapy. She did not require adjuvant radiation therapy.  -Due for cycle #17 (last cycle) Herceptin today. Encouraged her to "ring the bell" after her last treatment today.  -No role for adjuvant anti-estrogen therapy given ER-.  -Last ECHO 11/18/16 normal with EF 55-60%.  -(R) breast screening mammogram done on 12/17/16 and negative for malignancy.  -We discussed her surveillance plan and follow-up visit schedule today. We will see her every 3-4 months for the first couple of years. She understands her highest risk of recurrence is generally in the first 2 years. Then visits will extend to every 6 months or so for a total of 5  years, at which time we will see her annually.  -Return to cancer center in mid-03/2017 for clinical breast exam and follow-up.     Port-a-cath:  -Recommended leaving in her port-a-cath until her least her first follow-up visit.  She would like to have it removed before the end of this calendar year because of insurance/payment concerns.  She understands that we will likely be able to make arrangements for port removal for sometime in 04/2017; she agrees with this plan.    Cardiology issues:  -Stable; no reports of chest pain.  -Maintain  follow-up with cardiology as directed.    Hot flashes:  -She never tried gabapentin for her hot flashes; she does not like taking medication. Will keep monitoring symptoms.      Dispo:  -Return to cancer center in mid-03/2017 for clinical breast exam and follow-up visit with port flush/labs.  -Will likely be able to make arrangements for port removal at that visit for sometime in December.    All questions were answered to patient's stated satisfaction. Encouraged patient to call with any new concerns or questions before her next visit to the cancer center and we can certain see her sooner, if needed.    Plan of care discussed with Dr. Talbert Cage, who agrees with the above aforementioned.    Orders placed this encounter:  Orders Placed This Encounter  Procedures  . CBC with Differential/Platelet  . Comprehensive metabolic panel      Mike Craze, NP [place] [phone]

## 2017-01-01 NOTE — Progress Notes (Signed)
Reviewed no complaints per patient for Herceptin today with the nurse practitioner.  Patient is scheduled for an office visit.  Labs drawn and Ok to start treatment per Mike Craze, NP.    Patient stated she took her own benadryl and tylenol at home around 0810 before visit today.    Patient tolerated Herceptin with no complaints voiced.  Port site clean and dry with no bruising or swelling noted at site.  Band aid applied.  VSS with discharge and left ambulatory with no complaints voiced.

## 2017-01-05 DIAGNOSIS — Z6824 Body mass index (BMI) 24.0-24.9, adult: Secondary | ICD-10-CM | POA: Diagnosis not present

## 2017-01-05 DIAGNOSIS — Z01419 Encounter for gynecological examination (general) (routine) without abnormal findings: Secondary | ICD-10-CM | POA: Diagnosis not present

## 2017-01-05 DIAGNOSIS — N812 Incomplete uterovaginal prolapse: Secondary | ICD-10-CM | POA: Diagnosis not present

## 2017-01-05 DIAGNOSIS — Z1212 Encounter for screening for malignant neoplasm of rectum: Secondary | ICD-10-CM | POA: Diagnosis not present

## 2017-01-21 ENCOUNTER — Other Ambulatory Visit (HOSPITAL_COMMUNITY): Payer: Self-pay | Admitting: *Deleted

## 2017-01-21 MED ORDER — METOPROLOL TARTRATE 25 MG PO TABS: 25.0000 mg | ORAL_TABLET | Freq: Two times a day (BID) | ORAL | 2 refills | Status: DC

## 2017-02-08 ENCOUNTER — Encounter: Payer: Self-pay | Admitting: Gastroenterology

## 2017-02-17 DIAGNOSIS — H6692 Otitis media, unspecified, left ear: Secondary | ICD-10-CM | POA: Diagnosis not present

## 2017-02-17 DIAGNOSIS — J069 Acute upper respiratory infection, unspecified: Secondary | ICD-10-CM | POA: Diagnosis not present

## 2017-02-17 DIAGNOSIS — Z1389 Encounter for screening for other disorder: Secondary | ICD-10-CM | POA: Diagnosis not present

## 2017-02-17 DIAGNOSIS — Z6825 Body mass index (BMI) 25.0-25.9, adult: Secondary | ICD-10-CM | POA: Diagnosis not present

## 2017-02-18 ENCOUNTER — Ambulatory Visit (INDEPENDENT_AMBULATORY_CARE_PROVIDER_SITE_OTHER): Payer: BLUE CROSS/BLUE SHIELD | Admitting: Gastroenterology

## 2017-02-18 ENCOUNTER — Encounter: Payer: Self-pay | Admitting: Gastroenterology

## 2017-02-18 ENCOUNTER — Encounter: Payer: Self-pay | Admitting: *Deleted

## 2017-02-18 ENCOUNTER — Other Ambulatory Visit: Payer: Self-pay

## 2017-02-18 ENCOUNTER — Other Ambulatory Visit: Payer: Self-pay | Admitting: *Deleted

## 2017-02-18 VITALS — BP 128/78 | HR 84 | Temp 98.4°F | Ht 66.0 in | Wt 155.2 lb

## 2017-02-18 DIAGNOSIS — Z803 Family history of malignant neoplasm of breast: Secondary | ICD-10-CM | POA: Diagnosis not present

## 2017-02-18 DIAGNOSIS — R195 Other fecal abnormalities: Secondary | ICD-10-CM | POA: Insufficient documentation

## 2017-02-18 DIAGNOSIS — K5900 Constipation, unspecified: Secondary | ICD-10-CM | POA: Diagnosis not present

## 2017-02-18 MED ORDER — CLENPIQ 10-3.5-12 MG-GM -GM/160ML PO SOLN: 1.0000 | Freq: Once | ORAL | 0 refills | Status: AC

## 2017-02-18 NOTE — Assessment & Plan Note (Signed)
Declining prescriptive agents. Add fiber to diet, continue increased water intake. Call if she would like to trial Linzess or Amitiza. Would trial Linzess first due to ease of dosing.

## 2017-02-18 NOTE — Progress Notes (Signed)
CC'ED TO PCP 

## 2017-02-18 NOTE — Assessment & Plan Note (Signed)
Mother in her 67s, shortly passing away due to metastatic disease. Will need close surveillance following this colonoscopy.

## 2017-02-18 NOTE — Progress Notes (Addendum)
REVIEWED. Mother had colon cancer age > 42. REC'S ARE STARTING TCS AT AGE 61 BUT NO NEED TO SHORTEN INTERVAL TCS BASED ON FAM Hx ALONE.  Primary Care Physician:  Dian Queen, MD Primary Gastroenterologist:  Dr. Oneida Alar   Chief Complaint  Patient presents with  . positive occult blood    straining with BM's, hemorroids    HPI:   [name] is a 61 y.o. female presenting today at the request of Dian Queen, MD secondary to heme positive stool. She is followed by [facility] due to Stage IA breast cancer, s/p simple left mastectomy in Sept 2017. Just completed treatments in August.    17 years ago colonoscopy by Dr. Laural Golden and thinks she had polyps. No overt GI bleeding. No melena. Most of the time has hard stool. Bristol scale #1 and 2. BM usually about every day. Will sometimes have to strain. Sometimes feels pressure like she has to go but can't. Sometimes has a blow out, usually after dealing with feeling constipated. Associated abdominal cramping. No agents for constipation. Eats bran cereal and bananas with raisins. Tries to drink plenty of water. Does not want to try prescriptive agents at this time.   Has chronic history of indigestion. Prilosec 20 mg BID currently. No dysphagia.   Past Medical History:  Diagnosis Date  . Anxiety   . Breast cancer ([place])   . Breast cancer of upper-outer quadrant of left female breast ([place]) 01/07/2016  . Complication of anesthesia   . DDD (degenerative disc disease), cervical   . DDD (degenerative disc disease), lumbar   . Depression   . Dysrhythmia    last cardiology ov 06-13-2013 in [facility], takes metoprolol for HR  . Family history of breast cancer   . Family history of colon cancer   . Fibromyalgia   . GERD (gastroesophageal reflux disease)   . Hematuria   . History of echocardiogram 2015   mild mital insufficiency, otherwise normal  . Leaky heart valve   . Multiple thyroid nodules   . PAC (premature atrial  contraction)   . Palpitations   . PONV (postoperative nausea and vomiting)   . SOB (shortness of breath)   . Tachycardia     Past Surgical History:  Procedure Laterality Date  . BREAST LUMPECTOMY    . COLONOSCOPY     17 years ago by Dr. Laural Golden   . LAPAROSCOPIC APPENDECTOMY N/A 11/10/2012   Procedure: APPENDECTOMY LAPAROSCOPIC;  Surgeon: Jamesetta So, MD;  Location: [facility];  Service: General;  Laterality: N/A;  . MASTECTOMY Left 01/2016  . MASTECTOMY W/ SENTINEL NODE BIOPSY Left 01/07/2016  . MASTECTOMY W/ SENTINEL NODE BIOPSY Left 01/07/2016   Procedure: LEFT TOTAL MASTECTOMY WITH LEFT AXILLARY SENTINEL LYMPH NODE BIOPSY;  Surgeon: Donnie Mesa, MD;  Location: [facility];  Service: General;  Laterality: Left;  . PORTACATH PLACEMENT Right 01/07/2016   IJ  . PORTACATH PLACEMENT Right 01/07/2016   Procedure: INSERTION PORT-A-CATH RIGHT INTERNAL JUGULAR WITH ULTRASOUND;  Surgeon: Donnie Mesa, MD;  Location: [facility];  Service: General;  Laterality: Right;    Current Outpatient Prescriptions  Medication Sig Dispense Refill  . acetaminophen (TYLENOL) 500 MG tablet Take 500 mg by mouth every 6 (six) hours as needed for mild pain.    Marland Kitchen ALPRAZolam (XANAX) 0.5 MG tablet Take 1 tablet (0.5 mg total) by mouth 3 (three) times daily as needed for anxiety. 60 tablet 1  . antiseptic oral rinse (BIOTENE) LIQD 15 mLs by  Mouth Rinse route as needed for dry mouth.    . Cholecalciferol (VITAMIN D3) 1000 units CAPS Take 1,000 Units by mouth every morning.    . meclizine (ANTIVERT) 25 MG tablet Take 1 tablet (25 mg total) by mouth 3 (three) times daily as needed for dizziness. 30 tablet 0  . metoprolol tartrate (LOPRESSOR) 25 MG tablet Take 1 tablet (25 mg total) by mouth 2 (two) times daily. 25 mg in the morning. 12.5 mg in the evening, 60 tablet 2  . omeprazole (PRILOSEC) 20 MG capsule Take 1 capsule (20 mg total) by mouth 2 (two) times daily. 60 capsule 6  . Polyethyl Glycol-Propyl Glycol (SYSTANE) 0.4-0.3 %  SOLN Apply 1 drop to eye at bedtime.     No current facility-administered medications for this visit.    Facility-Administered Medications Ordered in Other Visits  Medication Dose Route Frequency Provider Last Rate Last Dose  . sodium chloride flush (NS) 0.9 % injection 10 mL  10 mL Intracatheter PRN Penland, Kelby Fam, MD        Allergies as of 02/18/2017 - Review Complete 02/18/2017  Allergen Reaction Noted  . Latex Itching 11/09/2012  . Naprosyn [naproxen] Other (See Comments) 11/09/2014  . Other  01/03/2016  . Codeine Nausea And Vomiting 03/15/2011    Family History  Problem Relation Age of Onset  . Colon cancer Mother 92       deceased secondary to metastatic cancer  . Diabetes Father   . Diabetes Sister   . Alcohol abuse Brother   . Cancer Brother 64       lung cancer  . Cancer Brother        penile  . Breast cancer Maternal Aunt   . Cancer Other        unknown form of cancer    Social History   Social History  . Marital status: Married    Spouse name: N/A  . Number of children: 2  . Years of education: N/A   Occupational History  . Not on file.   Social History Main Topics  . Smoking status: Never Smoker  . Smokeless tobacco: Never Used  . Alcohol use No  . Drug use: No  . Sexual activity: Yes     Comment: married   Other Topics Concern  . Not on file   Social History Narrative  . No narrative on file    Review of Systems: Gen: Denies any fever, chills, fatigue, weight loss, lack of appetite.  CV: Denies chest pain, heart palpitations, peripheral edema, syncope.  Resp: Denies shortness of breath at rest or with exertion. Denies wheezing or cough.  GI: see HPI  GU : Denies urinary burning, urinary frequency, urinary hesitancy MS: +joint pain  Derm: Denies rash, itching, dry skin Psych: Denies depression, anxiety, memory loss, and confusion Heme: see HPI   Physical Exam: BP 128/78   Pulse 84   Temp 98.4 F (36.9 C) (Oral)   Ht 5\' 6"   (1.676 m)   Wt 155 lb 3.2 oz (70.4 kg)   BMI 25.05 kg/m  General:   Alert and oriented. Pleasant and cooperative. Well-nourished and well-developed.  Head:  Normocephalic and atraumatic. Eyes:  Without icterus, sclera clear and conjunctiva pink.  Ears:  Normal auditory acuity. Nose:  No deformity, discharge,  or lesions. Mouth:  No deformity or lesions, oral mucosa pink.  Lungs:  Clear to auscultation bilaterally. No wheezes, rales, or rhonchi. No distress.  Heart:  S1, S2 present without  murmurs appreciated.  Abdomen:  +BS, soft, non-tender and non-distended. No HSM noted. No guarding or rebound. No masses appreciated.  Rectal:  Deferred  Msk:  Symmetrical without gross deformities. Normal posture. Extremities:  Without  edema. Neurologic:  Alert and  oriented x4;  grossly normal neurologically. Psych:  Alert and cooperative. Normal mood and affect.  Lab Results  Component Value Date   WBC 4.4 01/01/2017   HGB 13.5 01/01/2017   HCT 40.4 01/01/2017   MCV 94.6 01/01/2017   PLT 221 01/01/2017   Lab Results  Component Value Date   ALT 22 01/01/2017   AST 23 01/01/2017   ALKPHOS 75 01/01/2017   BILITOT 0.6 01/01/2017   Lab Results  Component Value Date   CREATININE 0.68 01/01/2017   BUN 17 01/01/2017   NA 138 01/01/2017   K 4.0 01/01/2017   CL 107 01/01/2017   CO2 26 01/01/2017   Lab Results  Component Value Date   TSH 1.395 10/30/2016

## 2017-02-18 NOTE — Assessment & Plan Note (Addendum)
61 year old female recently found to have heme positive stool by gynecologist. Normal CBC on file from late August. Last colonoscopy almost 17 years ago by Dr. Laural Golden, reportedly with polyps but path unknown. Needs diagnostic colonoscopy in near future.  Proceed with TCS with Dr. Oneida Alar in near future: the risks, benefits, and alternatives have been discussed with the patient in detail. The patient states understanding and desires to proceed. Phenergan 12.5 mg IV on call due to taking one xanax each evening

## 2017-02-18 NOTE — Patient Instructions (Signed)
We have scheduled you for a colonoscopy in the near future.  For constipation: try adding Metamucil or Benefiber daily. If this does not help, let me know. We can try samples of a medication to help you.   It was a pleasure to see you today. I strive to create trusting relationships with patients to provide genuine, compassionate, and quality care. I value your feedback. If you receive a survey regarding your visit,  I greatly appreciate you the taking time to fill this out.   Annitta Needs, PhD, ANP-BC [facility]

## 2017-03-09 ENCOUNTER — Other Ambulatory Visit: Payer: Self-pay

## 2017-03-09 ENCOUNTER — Encounter (HOSPITAL_COMMUNITY): Payer: Self-pay | Admitting: *Deleted

## 2017-03-09 ENCOUNTER — Encounter (HOSPITAL_COMMUNITY)
Admission: RE | Disposition: A | Discharge status: Home / Self Care | Payer: Self-pay | Source: Ambulatory Visit | Attending: Gastroenterology

## 2017-03-09 ENCOUNTER — Ambulatory Visit (HOSPITAL_COMMUNITY)
Admission: RE | Admit: 2017-03-09 | Discharge: 2017-03-09 | Disposition: A | Discharge status: Home / Self Care | Payer: BLUE CROSS/BLUE SHIELD | Source: Ambulatory Visit | Attending: Gastroenterology | Admitting: Gastroenterology

## 2017-03-09 DIAGNOSIS — Z853 Personal history of malignant neoplasm of breast: Secondary | ICD-10-CM | POA: Diagnosis not present

## 2017-03-09 DIAGNOSIS — R195 Other fecal abnormalities: Secondary | ICD-10-CM | POA: Diagnosis not present

## 2017-03-09 DIAGNOSIS — D122 Benign neoplasm of ascending colon: Secondary | ICD-10-CM | POA: Diagnosis not present

## 2017-03-09 DIAGNOSIS — Z888 Allergy status to other drugs, medicaments and biological substances status: Secondary | ICD-10-CM | POA: Insufficient documentation

## 2017-03-09 DIAGNOSIS — Z885 Allergy status to narcotic agent status: Secondary | ICD-10-CM | POA: Diagnosis not present

## 2017-03-09 DIAGNOSIS — F329 Major depressive disorder, single episode, unspecified: Secondary | ICD-10-CM | POA: Diagnosis not present

## 2017-03-09 DIAGNOSIS — Q438 Other specified congenital malformations of intestine: Secondary | ICD-10-CM | POA: Insufficient documentation

## 2017-03-09 DIAGNOSIS — Z91040 Latex allergy status: Secondary | ICD-10-CM | POA: Insufficient documentation

## 2017-03-09 DIAGNOSIS — Z800 Family history of malignant neoplasm of digestive organs: Secondary | ICD-10-CM | POA: Diagnosis not present

## 2017-03-09 DIAGNOSIS — K644 Residual hemorrhoidal skin tags: Secondary | ICD-10-CM | POA: Diagnosis not present

## 2017-03-09 DIAGNOSIS — K219 Gastro-esophageal reflux disease without esophagitis: Secondary | ICD-10-CM | POA: Diagnosis not present

## 2017-03-09 DIAGNOSIS — I491 Atrial premature depolarization: Secondary | ICD-10-CM | POA: Diagnosis not present

## 2017-03-09 DIAGNOSIS — F419 Anxiety disorder, unspecified: Secondary | ICD-10-CM | POA: Insufficient documentation

## 2017-03-09 DIAGNOSIS — Z79899 Other long term (current) drug therapy: Secondary | ICD-10-CM | POA: Insufficient documentation

## 2017-03-09 DIAGNOSIS — D128 Benign neoplasm of rectum: Secondary | ICD-10-CM

## 2017-03-09 DIAGNOSIS — Z86010 Personal history of colonic polyps: Secondary | ICD-10-CM

## 2017-03-09 DIAGNOSIS — M797 Fibromyalgia: Secondary | ICD-10-CM | POA: Insufficient documentation

## 2017-03-09 DIAGNOSIS — Z886 Allergy status to analgesic agent status: Secondary | ICD-10-CM | POA: Insufficient documentation

## 2017-03-09 HISTORY — PX: COLONOSCOPY: SHX5424

## 2017-03-09 SURGERY — COLONOSCOPY
Anesthesia: Moderate Sedation

## 2017-03-09 MED ORDER — PROMETHAZINE HCL 25 MG/ML IJ SOLN
INTRAMUSCULAR | Status: AC
  Filled 2017-03-09: qty 1

## 2017-03-09 MED ORDER — MEPERIDINE HCL 100 MG/ML IJ SOLN
INTRAMUSCULAR | Status: DC | PRN
  Administered 2017-03-09 (×3): 25 mg via INTRAVENOUS

## 2017-03-09 MED ORDER — PROMETHAZINE HCL 25 MG/ML IJ SOLN
12.5000 mg | Freq: Once | INTRAMUSCULAR | Status: AC
  Administered 2017-03-09: 12.5 mg via INTRAVENOUS

## 2017-03-09 MED ORDER — MIDAZOLAM HCL 5 MG/5ML IJ SOLN
INTRAMUSCULAR | Status: AC
  Filled 2017-03-09: qty 10

## 2017-03-09 MED ORDER — MIDAZOLAM HCL 5 MG/5ML IJ SOLN
INTRAMUSCULAR | Status: DC | PRN
  Administered 2017-03-09 (×3): 2 mg via INTRAVENOUS

## 2017-03-09 MED ORDER — SODIUM CHLORIDE 0.9 % IV SOLN
INTRAVENOUS | Status: DC
  Administered 2017-03-09: 14:00:00 via INTRAVENOUS

## 2017-03-09 MED ORDER — SODIUM CHLORIDE 0.9% FLUSH
INTRAVENOUS | Status: AC
  Filled 2017-03-09: qty 10

## 2017-03-09 MED ORDER — MEPERIDINE HCL 100 MG/ML IJ SOLN
INTRAMUSCULAR | Status: AC
  Filled 2017-03-09: qty 2

## 2017-03-09 NOTE — Op Note (Signed)
[facility] Patient Name: [name] Procedure Date: 03/09/2017 2:43 PM MRN: [id number] Date of Birth: 1956/04/24 Attending MD: Barney Drain MD, MD CSN: [id number] Age: 61 Admit Type: Outpatient Procedure:                Colonoscopy WITH COLD SNARE/SNARE CAUTERY                            POLYPECTOMY Indications:              Heme positive stool, Personal history of colonic                            polyps Providers:                Barney Drain MD, MD, Janeece Riggers, RN, Rosina Lowenstein,                            RN Referring MD:             Dian Queen MD, MD Medicines:                Promethazine 12.5 mg IV, Meperidine 75 mg IV,                            Midazolam 6 mg IV Complications:            No immediate complications. Estimated Blood Loss:     Estimated blood loss was minimal. Procedure:                Pre-Anesthesia Assessment:                           - Prior to the procedure, a History and Physical                            was performed, and patient medications and                            allergies were reviewed. The patient's tolerance of                            previous anesthesia was also reviewed. The risks                            and benefits of the procedure and the sedation                            options and risks were discussed with the patient.                            All questions were answered, and informed consent                            was obtained. Prior Anticoagulants: The patient has  taken no previous anticoagulant or antiplatelet                            agents. ASA Grade Assessment: II - A patient with                            mild systemic disease. After reviewing the risks                            and benefits, the patient was deemed in                            satisfactory condition to undergo the procedure.                            After obtaining informed consent, the colonoscope                             was passed under direct vision. Throughout the                            procedure, the patient's blood pressure, pulse, and                            oxygen saturations were monitored continuously. The                            EC-[id number] ([id number]) scope was introduced through                            the anus and advanced to the 7 cm into the ileum.                            The colonoscopy was technically difficult and                            complex due to a tortuous colon. Successful                            completion of the procedure was aided by                            straightening and shortening the scope to obtain                            bowel loop reduction and COLOWRAP. The patient                            tolerated the procedure well. The quality of the                            bowel preparation was good. The terminal ileum,  ileocecal valve, appendiceal orifice, and rectum                            were photographed. Scope In: 3:22:22 PM Scope Out: 3:42:00 PM Scope Withdrawal Time: 0 hours 17 minutes 55 seconds  Total Procedure Duration: 0 hours 19 minutes 38 seconds  Findings:      The terminal ileum appeared normal.      Two carpet-like and semi-sessile polyps were found in the ascending       colon. The polyps were 5 to 7 mm in size. Area was successfully injected       with 2 mL ELEVIEW for a lift polypectomy. These polyps were removed with       a cold snare. Resection and retrieval were complete. Coagulation for       hemostasis of bleeding caused by the procedure using snare was       successful.      A 6 mm polyp was found in the rectum. The polyp was sessile. The polyp       was removed with a hot snare. Resection and retrieval were complete.      External hemorrhoids were found during retroflexion. The hemorrhoids       were moderate. Impression:               - HEME POSITIVE STOOL DUE TO  COLORECTAL POLYPS                           - External hemorrhoids. Moderate Sedation:      Moderate (conscious) sedation was administered by the endoscopy nurse       and supervised by the endoscopist. The following parameters were       monitored: oxygen saturation, heart rate, blood pressure, and response       to care. Total physician intraservice time was 35 minutes. Recommendation:           - Repeat colonoscopy in 3 years for surveillance.                           - High fiber diet.                           - Continue present medications.                           - Await pathology results.                           - Patient has a contact number available for                            emergencies. The signs and symptoms of potential                            delayed complications were discussed with the                            patient. Return to normal activities tomorrow.  Written discharge instructions were provided to the                            patient. Procedure Code(s):        --- Professional ---                           [phone], Colonoscopy, flexible; with removal of                            tumor(s), polyp(s), or other lesion(s) by snare                            technique                           45381, Colonoscopy, flexible; with directed                            submucosal injection(s), any substance                           99152, Moderate sedation services provided by the                            same physician or other qualified health care                            professional performing the diagnostic or                            therapeutic service that the sedation supports,                            requiring the presence of an independent trained                            observer to assist in the monitoring of the                            patient's level of consciousness and physiological                             status; initial 15 minutes of intraservice time,                            patient age 66 years or older                           [phone], Moderate sedation services; each additional                            15 minutes intraservice time Diagnosis Code(s):        --- Professional ---  K64.4, Residual hemorrhoidal skin tags                           D12.2, Benign neoplasm of ascending colon                           K62.1, Rectal polyp                           R19.5, Other fecal abnormalities                           Z86.010, Personal history of colonic polyps CPT copyright 2016 American Medical Association. All rights reserved. The codes documented in this report are preliminary and upon coder review may  be revised to meet current compliance requirements. Barney Drain, MD Barney Drain MD, MD 03/09/2017 3:56:51 PM This report has been signed electronically. Number of Addenda: 0

## 2017-03-09 NOTE — Discharge Instructions (Signed)
You had 3 polyps removed. You have MODERATE SIZE EXTERNAL hemorrhoids.   DRINK WATER TO KEEP YOUR URINE LIGHT YELLOW.  FOLLOW A HIGH FIBER DIET. AVOID ITEMS THAT CAUSE BLOATING & GAS. SEE INFO BELOW.  YOUR BIOPSY RESULTS WILL BE AVAILABLE IN MY CHART AFTER NOV 13 AND MY OFFICE WILL CONTACT YOU IN 10-14 DAYS WITH YOUR RESULTS.   Next colonoscopy in 3 years.    Colonoscopy Care After Read the instructions outlined below and refer to this sheet in the next week. These discharge instructions provide you with general information on caring for yourself after you leave the hospital. While your treatment has been planned according to the most current medical practices available, unavoidable complications occasionally occur. If you have any problems or questions after discharge, call DR. Ashe Gago, [phone].  ACTIVITY  You may resume your regular activity, but move at a slower pace for the next 24 hours.   Take frequent rest periods for the next 24 hours.   Walking will help get rid of the air and reduce the bloated feeling in your belly (abdomen).   No driving for 24 hours (because of the medicine (anesthesia) used during the test).   You may shower.   Do not sign any important legal documents or operate any machinery for 24 hours (because of the anesthesia used during the test).    NUTRITION  Drink plenty of fluids.   You may resume your normal diet as instructed by your doctor.   Begin with a light meal and progress to your normal diet. Heavy or fried foods are harder to digest and may make you feel sick to your stomach (nauseated).   Avoid alcoholic beverages for 24 hours or as instructed.    MEDICATIONS  You may resume your normal medications.   WHAT YOU CAN EXPECT TODAY  Some feelings of bloating in the abdomen.   Passage of more gas than usual.   Spotting of blood in your stool or on the toilet paper  .  IF YOU HAD POLYPS REMOVED DURING THE COLONOSCOPY:  Eat a  soft diet IF YOU HAVE NAUSEA, BLOATING, ABDOMINAL PAIN, OR VOMITING.    FINDING OUT THE RESULTS OF YOUR TEST Not all test results are available during your visit. DR. Oneida Alar WILL CALL YOU WITHIN 14 DAYS OF YOUR PROCEDUE WITH YOUR RESULTS. Do not assume everything is normal if you have not heard from DR. Taniela Feltus, CALL HER OFFICE AT [phone].  SEEK IMMEDIATE MEDICAL ATTENTION AND CALL THE OFFICE: [phone] IF:  You have more than a spotting of blood in your stool.   Your belly is swollen (abdominal distention).   You are nauseated or vomiting.   You have a temperature over 101F.   You have abdominal pain or discomfort that is severe or gets worse throughout the day.   High-Fiber Diet A high-fiber diet changes your normal diet to include more whole grains, legumes, fruits, and vegetables. Changes in the diet involve replacing refined carbohydrates with unrefined foods. The calorie level of the diet is essentially unchanged. The Dietary Reference Intake (recommended amount) for adult males is 38 grams per day. For adult females, it is 25 grams per day. Pregnant and lactating women should consume 28 grams of fiber per day. Fiber is the intact part of a plant that is not broken down during digestion. Functional fiber is fiber that has been isolated from the plant to provide a beneficial effect in the body. PURPOSE  Increase stool bulk.  Ease and regulate bowel movements.   Lower cholesterol.   REDUCE RISK OF COLON CANCER  INDICATIONS THAT YOU NEED MORE FIBER  Constipation and hemorrhoids.   Uncomplicated diverticulosis (intestine condition) and irritable bowel syndrome.   Weight management.   As a protective measure against hardening of the arteries (atherosclerosis), diabetes, and cancer.   GUIDELINES FOR INCREASING FIBER IN THE DIET  Start adding fiber to the diet slowly. A gradual increase of about 5 more grams (2 slices of whole-wheat bread, 2 servings of most fruits  or vegetables, or 1 bowl of high-fiber cereal) per day is best. Too rapid an increase in fiber may result in constipation, flatulence, and bloating.   Drink enough water and fluids to keep your urine clear or pale yellow. Water, juice, or caffeine-free drinks are recommended. Not drinking enough fluid may cause constipation.   Eat a variety of high-fiber foods rather than one type of fiber.   Try to increase your intake of fiber through using high-fiber foods rather than fiber pills or supplements that contain small amounts of fiber.   The goal is to change the types of food eaten. Do not supplement your present diet with high-fiber foods, but replace foods in your present diet.   INCLUDE A VARIETY OF FIBER SOURCES  Replace refined and processed grains with whole grains, canned fruits with fresh fruits, and incorporate other fiber sources. White rice, white breads, and most bakery goods contain little or no fiber.   Brown whole-grain rice, buckwheat oats, and many fruits and vegetables are all good sources of fiber. These include: broccoli, Brussels sprouts, cabbage, cauliflower, beets, sweet potatoes, white potatoes (skin on), carrots, tomatoes, eggplant, squash, berries, fresh fruits, and dried fruits.   Cereals appear to be the richest source of fiber. Cereal fiber is found in whole grains and bran. Bran is the fiber-rich outer coat of cereal grain, which is largely removed in refining. In whole-grain cereals, the bran remains. In breakfast cereals, the largest amount of fiber is found in those with "bran" in their names. The fiber content is sometimes indicated on the label.   You may need to include additional fruits and vegetables each day.   In baking, for 1 cup white flour, you may use the following substitutions:   1 cup whole-wheat flour minus 2 tablespoons.   1/2 cup white flour plus 1/2 cup whole-wheat flour.   Polyps, Colon  A polyp is extra tissue that grows inside your body.  Colon polyps grow in the large intestine. The large intestine, also called the colon, is part of your digestive system. It is a long, hollow tube at the end of your digestive tract where your body makes and stores stool. Most polyps are not dangerous. They are benign. This means they are not cancerous. But over time, some types of polyps can turn into cancer. Polyps that are smaller than a pea are usually not harmful. But larger polyps could someday become or may already be cancerous. To be safe, doctors remove all polyps and test them.   WHO GETS POLYPS? Anyone can get polyps, but certain people are more likely than others. You may have a greater chance of getting polyps if:  You are over 50.   You have had polyps before.   Someone in your family has had polyps.   Someone in your family has had cancer of the large intestine.   Find out if someone in your family has had polyps. You may also be  more likely to get polyps if you:   Eat a lot of fatty foods   Smoke   Drink alcohol   Do not exercise  Eat too much   PREVENTION There is not one sure way to prevent polyps. You might be able to lower your risk of getting them if you:  Eat more fruits and vegetables and less fatty food.   Do not smoke.   Avoid alcohol.   Exercise every day.   Lose weight if you are overweight.   Eating more calcium and folate can also lower your risk of getting polyps. Some foods that are rich in calcium are milk, cheese, and broccoli. Some foods that are rich in folate are chickpeas, kidney beans, and spinach.   Hemorrhoids Hemorrhoids are dilated (enlarged) veins around the rectum. Sometimes clots will form in the veins. This makes them swollen and painful. These are called thrombosed hemorrhoids. Causes of hemorrhoids include:  Constipation.   Straining to have a bowel movement.   HEAVY LIFTING  HOME CARE INSTRUCTIONS  Eat a well balanced diet and drink 6 to 8 glasses of water every day to  avoid constipation. You may also use a bulk laxative.   Avoid straining to have bowel movements.   Keep anal area dry and clean.   Do not use a donut shaped pillow or sit on the toilet for long periods. This increases blood pooling and pain.   Move your bowels when your body has the urge; this will require less straining and will decrease pain and pressure.

## 2017-03-09 NOTE — H&P (Signed)
Primary Care Physician:  Dian Queen, MD Primary Gastroenterologist:  Dr. Oneida Alar  Pre-Procedure History & Physical: HPI:  [name] is a 61 y.o. female here for HEME POS STOOLS.  Past Medical History:  Diagnosis Date  . Anxiety   . Breast cancer ([place])   . Breast cancer of upper-outer quadrant of left female breast ([place]) 01/07/2016  . Complication of anesthesia   . DDD (degenerative disc disease), cervical   . DDD (degenerative disc disease), lumbar   . Depression   . Dysrhythmia    last cardiology ov 06-13-2013 in [facility], takes metoprolol for HR  . Family history of breast cancer   . Family history of colon cancer   . Fibromyalgia   . GERD (gastroesophageal reflux disease)   . Hematuria   . History of echocardiogram 2015   mild mital insufficiency, otherwise normal  . Leaky heart valve   . Multiple thyroid nodules   . PAC (premature atrial contraction)   . Palpitations   . PONV (postoperative nausea and vomiting)   . SOB (shortness of breath)   . Tachycardia     Past Surgical History:  Procedure Laterality Date  . BREAST LUMPECTOMY    . COLONOSCOPY     17 years ago by Dr. Laural Golden   . MASTECTOMY Left 01/2016  . MASTECTOMY W/ SENTINEL NODE BIOPSY Left 01/07/2016  . PORTACATH PLACEMENT Right 01/07/2016   IJ    Prior to Admission medications   Medication Sig Start Date End Date Taking? Authorizing Provider  acetaminophen (TYLENOL) 500 MG tablet Take 500 mg by mouth every 6 (six) hours as needed for mild pain.   Yes [provider]  ALPRAZolam (XANAX) 0.5 MG tablet Take 1 tablet (0.5 mg total) by mouth 3 (three) times daily as needed for anxiety. 12/11/16  Yes Choksi, Delorise Shiner, MD  Cholecalciferol (VITAMIN D3) 2000 units TABS Take 2,000 Units by mouth every morning.    Yes [provider]  meclizine (ANTIVERT) 25 MG tablet Take 1 tablet (25 mg total) by mouth 3 (three) times daily as needed for dizziness. 06/26/16  Yes Holley Bouche, NP  metoprolol  tartrate (LOPRESSOR) 25 MG tablet Take 1 tablet (25 mg total) by mouth 2 (two) times daily. 25 mg in the morning. 12.5 mg in the evening, 01/21/17  Yes Holley Bouche, NP  omeprazole (PRILOSEC) 20 MG capsule Take 1 capsule (20 mg total) by mouth 2 (two) times daily. 01/01/17  Yes Holley Bouche, NP  Polyethyl Glycol-Propyl Glycol (SYSTANE) 0.4-0.3 % SOLN Apply 1 drop to eye at bedtime.   Yes [provider]  antiseptic oral rinse (BIOTENE) LIQD 15 mLs by Mouth Rinse route as needed for dry mouth.    [provider]    Allergies as of 02/18/2017 - Review Complete 02/18/2017  Allergen Reaction Noted  . Latex Itching 11/09/2012  . Naprosyn [naproxen] Other (See Comments) 11/09/2014  . Other  01/03/2016  . Codeine Nausea And Vomiting 03/15/2011    Family History  Problem Relation Age of Onset  . Colon cancer Mother 53       deceased secondary to metastatic cancer  . Diabetes Father   . Diabetes Sister   . Alcohol abuse Brother   . Cancer Brother 81       lung cancer  . Cancer Brother        penile  . Breast cancer Maternal Aunt   . Cancer Other        unknown form  of cancer    Social History   Socioeconomic History  . Marital status: Married    Spouse name: Not on file  . Number of children: 2  . Years of education: Not on file  . Highest education level: Not on file  Social Needs  . Financial resource strain: Not on file  . Food insecurity - worry: Not on file  . Food insecurity - inability: Not on file  . Transportation needs - medical: Not on file  . Transportation needs - non-medical: Not on file  Occupational History  . Not on file  Tobacco Use  . Smoking status: Never Smoker  . Smokeless tobacco: Never Used  Substance and Sexual Activity  . Alcohol use: No  . Drug use: No  . Sexual activity: Yes    Comment: married  Other Topics Concern  . Not on file  Social History Narrative  . Not on file    Review of Systems: See HPI,  otherwise negative ROS   Physical Exam: BP 132/81   Pulse 90   Temp 97.8 F (36.6 C) (Oral)   Resp 20   Ht 5\' 6"  (1.676 m)   Wt 155 lb (70.3 kg)   SpO2 100%   BMI 25.02 kg/m  General:   Alert,  pleasant and cooperative in NAD Head:  Normocephalic and atraumatic. Neck:  Supple; Lungs:  Clear throughout to auscultation.    Heart:  Regular rate and rhythm. Abdomen:  Soft, nontender and nondistended. Normal bowel sounds, without guarding, and without rebound.   Neurologic:  Alert and  oriented x4;  grossly normal neurologically.  Impression/Plan:     HEME POS STOOLS  PLAN:  1.TCS TODAY. DISCUSSED PROCEDURE, BENEFITS, & RISKS: < 1% chance of medication reaction, bleeding, perforation, or rupture of spleen/liver.

## 2017-03-10 DIAGNOSIS — N812 Incomplete uterovaginal prolapse: Secondary | ICD-10-CM | POA: Diagnosis not present

## 2017-03-11 ENCOUNTER — Encounter (HOSPITAL_COMMUNITY): Payer: Self-pay | Admitting: Gastroenterology

## 2017-03-11 ENCOUNTER — Ambulatory Visit: Payer: BLUE CROSS/BLUE SHIELD | Admitting: Gastroenterology

## 2017-03-12 ENCOUNTER — Telehealth: Payer: Self-pay

## 2017-03-12 NOTE — Telephone Encounter (Signed)
PLEAS CALL PT ON MON TO FOLLOW UP ON HER ARM DISCOMFORT.

## 2017-03-12 NOTE — Telephone Encounter (Signed)
I spoke to [name] and she had me call Endo and to see if we could send her by for them to take a look at her arm I called and spoke to [place] and she said to have pt come on over. Pt is aware and will go right away.

## 2017-03-12 NOTE — Telephone Encounter (Signed)
REVIEWED-NO ADDITIONAL RECOMMENDATIONS. 

## 2017-03-12 NOTE — Telephone Encounter (Signed)
PT called and said she is having problems with her arm where she had IV for procedure on 03/09/2017. She was fine from Tuesday until yesterday, then she said she has a very slight redness up along her veins and her arm is hurting now. She is especially concerned, since she is scheduled for hysterectomy surgery on Monday. I told her I will check with Roseanne Kaufman, NP, who saw her in the office and give her a call back @ [phone].

## 2017-03-15 NOTE — Telephone Encounter (Signed)
Pt returned call and said she was told on Friday when she went to Endo that she probably had phlebitis. She went for her hysterectomy this morning and they would not do it, said her arm look like phlebitis and also looked like she might have a staff infection. They give her Keflex and are going to wait and see how it does before they reschedule. She said she will let us know how it goes.

## 2017-03-15 NOTE — Telephone Encounter (Signed)
LMOM for a return call. Mobile number not accepting calls.

## 2017-03-15 NOTE — Telephone Encounter (Signed)
REVIEWED-NO ADDITIONAL RECOMMENDATIONS. 

## 2017-03-18 ENCOUNTER — Other Ambulatory Visit: Payer: Self-pay

## 2017-03-18 ENCOUNTER — Encounter (HOSPITAL_BASED_OUTPATIENT_CLINIC_OR_DEPARTMENT_OTHER): Payer: BLUE CROSS/BLUE SHIELD

## 2017-03-18 ENCOUNTER — Telehealth: Payer: Self-pay | Admitting: Gastroenterology

## 2017-03-18 ENCOUNTER — Encounter (HOSPITAL_COMMUNITY): Payer: Self-pay | Admitting: Oncology

## 2017-03-18 ENCOUNTER — Encounter (HOSPITAL_COMMUNITY): Payer: BLUE CROSS/BLUE SHIELD | Attending: Oncology | Admitting: Oncology

## 2017-03-18 VITALS — BP 107/62 | HR 76 | Temp 98.6°F | Resp 16 | Ht 66.0 in | Wt 155.5 lb

## 2017-03-18 DIAGNOSIS — Z171 Estrogen receptor negative status [ER-]: Secondary | ICD-10-CM

## 2017-03-18 DIAGNOSIS — C50412 Malignant neoplasm of upper-outer quadrant of left female breast: Secondary | ICD-10-CM | POA: Insufficient documentation

## 2017-03-18 DIAGNOSIS — C50919 Malignant neoplasm of unspecified site of unspecified female breast: Secondary | ICD-10-CM

## 2017-03-18 LAB — COMPREHENSIVE METABOLIC PANEL
ALBUMIN: 4 g/dL (ref 3.5–5.0)
ALT: 61 U/L — AB (ref 14–54)
AST: 57 U/L — AB (ref 15–41)
Alkaline Phosphatase: 106 U/L (ref 38–126)
Anion gap: 7 (ref 5–15)
BUN: 13 mg/dL (ref 6–20)
CHLORIDE: 103 mmol/L (ref 101–111)
CO2: 26 mmol/L (ref 22–32)
CREATININE: 0.76 mg/dL (ref 0.44–1.00)
Calcium: 9.5 mg/dL (ref 8.9–10.3)
GFR calc non Af Amer: >60 mL/min (ref >60)
Glucose, Bld: 95 mg/dL (ref 65–99)
Potassium: 4 mmol/L (ref 3.5–5.1)
Sodium: 136 mmol/L (ref 135–145)
Total Bilirubin: 0.9 mg/dL (ref 0.3–1.2)
Total Protein: 7.3 g/dL (ref 6.5–8.1)

## 2017-03-18 LAB — CBC WITH DIFFERENTIAL/PLATELET
Basophils Absolute: 0 10*3/uL (ref 0.0–0.1)
Basophils Relative: 0 %
EOS ABS: 0.1 10*3/uL (ref 0.0–0.7)
Eosinophils Relative: 3 %
HCT: 42.5 % (ref 36.0–46.0)
HEMOGLOBIN: 14.5 g/dL (ref 12.0–15.0)
Lymphocytes Relative: 49 %
Lymphs Abs: 2 10*3/uL (ref 0.7–4.0)
MCH: 32.7 pg (ref 26.0–34.0)
MCHC: 34.1 g/dL (ref 30.0–36.0)
MCV: 95.9 fL (ref 78.0–100.0)
Monocytes Absolute: 0.3 10*3/uL (ref 0.1–1.0)
Monocytes Relative: 7 %
NEUTROS PCT: 41 %
Neutro Abs: 1.7 10*3/uL (ref 1.7–7.7)
Platelets: 213 10*3/uL (ref 150–400)
RBC: 4.43 MIL/uL (ref 3.87–5.11)
RDW: 12.8 % (ref 11.5–15.5)
WBC: 4 10*3/uL (ref 4.0–10.5)

## 2017-03-18 MED ORDER — SODIUM CHLORIDE 0.9% FLUSH
10.0000 mL | Freq: Once | INTRAVENOUS | Status: AC
  Administered 2017-03-18: 10 mL via INTRAVENOUS

## 2017-03-18 MED ORDER — HEPARIN SOD (PORK) LOCK FLUSH 100 UNIT/ML IV SOLN
500.0000 [IU] | Freq: Once | INTRAVENOUS | Status: AC
  Administered 2017-03-18: 500 [IU] via INTRAVENOUS
  Filled 2017-03-18: qty 5

## 2017-03-18 NOTE — Progress Notes (Signed)
Port flushed per protocol.  Labs from port.  Port site clean and dry with no bruising or swelling noted at site.  Band aid applied.  VSS with discharge and left ambulatory with no s/s of distress noted.

## 2017-03-18 NOTE — Patient Instructions (Signed)
[facility]  Discharge Instructions:   _______________________________________________________________  Thank you for choosing [facility] at [facility] to provide your oncology and hematology care.  To afford each patient quality time with our providers, please arrive at least 15 minutes before your scheduled appointment.  You need to re-schedule your appointment if you arrive 10 or more minutes late.  We strive to give you quality time with our providers, and arriving late affects you and other patients whose appointments are after yours.  Also, if you no show three or more times for appointments you may be dismissed from the clinic.  Again, thank you for choosing [facility] at [facility]. Our hope is that these requests will allow you access to exceptional care and in a timely manner. _______________________________________________________________  If you have questions after your visit, please contact our office at [phone] between the hours of 8:30 a.m. and 5:00 p.m. Voicemails left after 4:30 p.m. will not be returned until the following business day. _______________________________________________________________  For prescription refill requests, have your pharmacy contact our office. _______________________________________________________________  Recommendations made by the consultant and any test results will be sent to your referring physician. _______________________________________________________________ 

## 2017-03-18 NOTE — Telephone Encounter (Signed)
Pt is aware.  

## 2017-03-18 NOTE — Telephone Encounter (Signed)
Please call pt. She had TWO simple adenomas and one serrated adenoma removed. FOLLOW A High fiber diet. Next TCS in 3 years.

## 2017-03-18 NOTE — Progress Notes (Signed)
[name], [place] [address]   CLINIC:  Medical Oncology/Hematology  PCP:  Dian Queen, [address] [place] [address] [phone]   REASON FOR VISIT:  Follow-up for Stage IA left breast invasive ductal carcinoma; ER-/PR-/HER2+  CURRENT THERAPY: Maintenance Herceptin every 21 days, beginning 01/31/16   BRIEF ONCOLOGIC HISTORY:    Breast cancer of upper-outer quadrant of left female breast ([place])   11/07/1999 Surgery    Lumpectomy/sentinel LN biopsy invasive ductal carcinoma and DCIS at [facility], 0/6 sentinel LN      11/10/1999 - 01/08/2005 Anti-estrogen oral therapy    Tamoxifen 20 mg daily      11/24/1999 - 01/02/2000 Radiation Therapy         12/18/2015 Mammogram    2D digital screening bilateral mammogram with CAD and adjunct TOMO, in the L breast a possible mass warrants further evaluation. In R breast no findings suspicious for malignancy      12/24/2015 Pathology Results    Invasive ductal carcinoma Grade 3 Left core needle biopsy 1:30 o clock ER- PR- Ki67 70%, HER 2 positive      12/24/2015 Imaging    L breast Ultrasound, notes palpable area of concern demonstrates a hypoechoic irregular mass at 1:30, 7 cm from the nipple measuring 1.3 x 0.9 x 1.4 cm. No evidence of L axillary LAD      01/07/2016 Procedure    Left simple mastectomy by Dr. Georgette Dover      01/08/2016 Pathology Results    Breast, simple mastectomy, Left - INVASIVE DUCTAL CARCINOMA, GRADE III/III, SPANNING 1.4 CM. - THE SURGICAL RESECTION MARGINS ARE NEGATIVE FOR CARCINOMA.      01/23/2016 Genetic Testing    Patient refused genetic testing at time of consultation with Roma Kayser.  "Despite our recommendation, [name] did not wish to pursue genetic testing at today's visit."      01/28/2016 Echocardiogram    Left ventricle: The cavity size was normal. Wall thickness was normal. Systolic function was normal. The estimated  ejection fraction was in the range of 55% to 60%. Wall motion was normal; there were no regional wall motion abnormalities. Doppler parameters are consistent with abnormal left ventricular relaxation (grade 1 diastolic dysfunction).      01/31/2016 - 05/15/2016 Chemotherapy    The patient had palonosetron (ALOXI) injection 0.25 mg, 0.25 mg, Intravenous,  Once, 1 of 6 cycles  pegfilgrastim (NEULASTA ONPRO KIT) injection 6 mg, 6 mg, Subcutaneous, Once, 1 of 6 cycles  CARBOplatin (PARAPLATIN) 560 mg in sodium chloride 0.9 % 250 mL chemo infusion, 560 mg (100 % of original dose 558.5 mg), Intravenous,  Once, 1 of 6 cycles Dose modification:   (original dose 558.5 mg, Cycle 1),   (original dose 558.5 mg, Cycle 2)  DOCEtaxel (TAXOTERE) 140 mg in dextrose 5 % 250 mL chemo infusion, 75 mg/m2 = 140 mg, Intravenous,  Once, 1 of 6 cycles  for chemotherapy treatment.        01/31/2016 - 01/01/2017 Antibody Plan    Herceptin x 52 weeks      08/24/2016 Echocardiogram    Left ventricle: The cavity size was normal. Wall thickness was normal. Systolic function was normal. The estimated ejection fraction was in the range of 55% to 60%. Wall motion was normal; there were no regional wall motion abnormalities. Doppler parameters are consistent with abnormal left ventricular relaxation (grade 1 diastolic dysfunction).      11/18/2016 Echocardiogram  LV EF: 55% -  60%. - Left ventricle: The cavity size was normal. Wall thickness was   normal. Systolic function was normal. The estimated ejection   fraction was in the range of 55% to 60%. Wall motion was normal;   there were no regional wall motion abnormalities. Left   ventricular diastolic function parameters were normal. - Mitral valve: There was mild regurgitation. - Tricuspid valve: There was mild regurgitation.        INTERVAL HISTORY:  [name] is here for follow-up of her left breast cancer. She was scheduled for a  hysterectomy on 03/15/17 but  they would not do it due to possible phlebitis in her right arm and possible staph infection. Patient has been taking keflex since 03/15/17. She stated she had a vein that looked very red in her right arm but has since improved with antibiotics.  She has not felt any masses in her right breast or any masses on her left chest wall. She states her left chest wall has been numb since her mastectomy surgery. She denies any weight loss, changes in appetite, bone pain, nausea, vomiting, diarrhea, chest pain, shortness of breath, abdominal pain.   REVIEW OF SYSTEMS:  Review of Systems  Constitutional: Negative.  Negative for chills, fatigue and fever.  HENT:  Negative.  Negative for lump/mass and nosebleeds.   Eyes: Negative.   Respiratory: Negative.  Negative for cough and shortness of breath.   Cardiovascular: Negative.  Negative for chest pain and leg swelling.  Gastrointestinal: Negative for abdominal pain, blood in stool, constipation, diarrhea, nausea and vomiting.  Endocrine: Negative for hot flashes.  Genitourinary: Negative.  Negative for dysuria and hematuria.   Musculoskeletal: Negative.  Negative for arthralgias.  Skin: Negative.  Negative for rash.  Neurological: Negative for dizziness, headaches and numbness.  Hematological: Negative.  Negative for adenopathy. Does not bruise/bleed easily.  Psychiatric/Behavioral: Negative for depression and sleep disturbance. The patient is not nervous/anxious.      PAST MEDICAL/SURGICAL HISTORY:  Past Medical History:  Diagnosis Date  . Anxiety   . Breast cancer ([place])   . Breast cancer of upper-outer quadrant of left female breast ([place]) 01/07/2016  . Complication of anesthesia   . DDD (degenerative disc disease), cervical   . DDD (degenerative disc disease), lumbar   . Depression   . Dysrhythmia    last cardiology ov 06-13-2013 in [facility], takes metoprolol for HR  . Family history of breast cancer   . Family history  of colon cancer   . Fibromyalgia   . GERD (gastroesophageal reflux disease)   . Hematuria   . History of echocardiogram 2015   mild mital insufficiency, otherwise normal  . Leaky heart valve   . Multiple thyroid nodules   . PAC (premature atrial contraction)   . Palpitations   . PONV (postoperative nausea and vomiting)   . SOB (shortness of breath)   . Tachycardia    Past Surgical History:  Procedure Laterality Date  . BREAST LUMPECTOMY    . COLONOSCOPY     17 years ago by Dr. Laural Golden   . COLONOSCOPY N/A 03/09/2017   Procedure: COLONOSCOPY;  Surgeon: Danie Binder, MD;  Location: [facility];  Service: Endoscopy;  Laterality: N/A;  2:45pm  . LAPAROSCOPIC APPENDECTOMY N/A 11/10/2012   Procedure: APPENDECTOMY LAPAROSCOPIC;  Surgeon: Jamesetta So, MD;  Location: [facility] ORS;  Service: General;  Laterality: N/A;  . MASTECTOMY Left 01/2016  . MASTECTOMY W/ SENTINEL NODE BIOPSY Left 01/07/2016  .  MASTECTOMY W/ SENTINEL NODE BIOPSY Left 01/07/2016   Procedure: LEFT TOTAL MASTECTOMY WITH LEFT AXILLARY SENTINEL LYMPH NODE BIOPSY;  Surgeon: Donnie Mesa, MD;  Location: [facility];  Service: General;  Laterality: Left;  . PORTACATH PLACEMENT Right 01/07/2016   IJ  . PORTACATH PLACEMENT Right 01/07/2016   Procedure: INSERTION PORT-A-CATH RIGHT INTERNAL JUGULAR WITH ULTRASOUND;  Surgeon: Donnie Mesa, MD;  Location: [facility];  Service: General;  Laterality: Right;     SOCIAL HISTORY:  Social History   Socioeconomic History  . Marital status: Married    Spouse name: Not on file  . Number of children: 2  . Years of education: Not on file  . Highest education level: Not on file  Social Needs  . Financial resource strain: Not on file  . Food insecurity - worry: Not on file  . Food insecurity - inability: Not on file  . Transportation needs - medical: Not on file  . Transportation needs - non-medical: Not on file  Occupational History  . Not on file  Tobacco Use  . Smoking status: Never Smoker    . Smokeless tobacco: Never Used  Substance and Sexual Activity  . Alcohol use: No  . Drug use: No  . Sexual activity: Yes    Comment: married  Other Topics Concern  . Not on file  Social History Narrative  . Not on file    FAMILY HISTORY:  Family History  Problem Relation Age of Onset  . Colon cancer Mother 24       deceased secondary to metastatic cancer  . Diabetes Father   . Diabetes Sister   . Alcohol abuse Brother   . Cancer Brother 31       lung cancer  . Cancer Brother        penile  . Breast cancer Maternal Aunt   . Cancer Other        unknown form of cancer    CURRENT MEDICATIONS:  Outpatient Encounter Medications as of 03/18/2017  Medication Sig  . acetaminophen (TYLENOL) 500 MG tablet Take 500 mg by mouth every 6 (six) hours as needed for mild pain.  Marland Kitchen ALPRAZolam (XANAX) 0.5 MG tablet Take 1 tablet (0.5 mg total) by mouth 3 (three) times daily as needed for anxiety.  Marland Kitchen antiseptic oral rinse (BIOTENE) LIQD 15 mLs by Mouth Rinse route as needed for dry mouth.  . Cholecalciferol (VITAMIN D3) 2000 units TABS Take 2,000 Units by mouth every morning.   . meclizine (ANTIVERT) 25 MG tablet Take 1 tablet (25 mg total) by mouth 3 (three) times daily as needed for dizziness.  . metoprolol tartrate (LOPRESSOR) 25 MG tablet Take 1 tablet (25 mg total) by mouth 2 (two) times daily. 25 mg in the morning. 12.5 mg in the evening,  . omeprazole (PRILOSEC) 20 MG capsule Take 1 capsule (20 mg total) by mouth 2 (two) times daily.  Vladimir Faster Glycol-Propyl Glycol (SYSTANE) 0.4-0.3 % SOLN Apply 1 drop to eye at bedtime.   Facility-Administered Encounter Medications as of 03/18/2017  Medication  . [COMPLETED] heparin lock flush 100 unit/mL  . sodium chloride flush (NS) 0.9 % injection 10 mL  . [COMPLETED] sodium chloride flush (NS) 0.9 % injection 10 mL    ALLERGIES:  Allergies  Allergen Reactions  . Latex Itching  . Naprosyn [Naproxen] Other (See Comments)    UNSPECIFIED  REACTION   . Other     USE PAPER TAPE ONLY  . Codeine Nausea And Vomiting  PHYSICAL EXAM:  ECOG Performance status: 1 - Symptomatic, but largely independent      Physical Exam  Constitutional: She is oriented to person, place, and time and well-developed, well-nourished, and in no distress.  HENT:  Head: Normocephalic.  Mouth/Throat: Oropharynx is clear and moist.  Eyes: Conjunctivae are normal. No scleral icterus.  Neck: Normal range of motion. Neck supple.  Cardiovascular: Normal rate and regular rhythm.  No murmur heard. Pulmonary/Chest: Effort normal and breath sounds normal. No respiratory distress.  Abdominal: Soft. Bowel sounds are normal. There is no tenderness.  Musculoskeletal: Normal range of motion. She exhibits no edema.  Lymphadenopathy:    She has no cervical adenopathy.       Right: No supraclavicular adenopathy present.       Left: No supraclavicular adenopathy present.  Neurological: She is alert and oriented to person, place, and time. No cranial nerve deficit.  Skin: Skin is warm and dry. No rash noted.  Psychiatric: Mood, memory, affect and judgment normal.  Nursing note and vitals reviewed. Breast exam: Right breast without masses, nipple discharge, axillary lymphadenopathy. Left chest wall without any subcutaneous nodules, skin changes, or axillary lymphadenopathy.   LABORATORY DATA:  I have reviewed the labs as listed.  CBC    Component Value Date/Time   WBC 4.4 01/01/2017 0828   RBC 4.27 01/01/2017 0828   HGB 13.5 01/01/2017 0828   HCT 40.4 01/01/2017 0828   PLT 221 01/01/2017 0828   MCV 94.6 01/01/2017 0828   MCH 31.6 01/01/2017 0828   MCHC 33.4 01/01/2017 0828   RDW 12.6 01/01/2017 0828   LYMPHSABS 2.3 01/01/2017 0828   MONOABS 0.4 01/01/2017 0828   EOSABS 0.2 01/01/2017 0828   BASOSABS 0.0 01/01/2017 0828   CMP Latest Ref Rng & Units 01/01/2017 10/30/2016 09/18/2016  Glucose 65 - 99 mg/dL 97 95 118(H)  BUN 6 - 20 mg/dL '17 15 14    ' Creatinine 0.44 - 1.00 mg/dL 0.68 0.72 0.67  Sodium 135 - 145 mmol/L 138 134(L) 137  Potassium 3.5 - 5.1 mmol/L 4.0 4.1 4.0  Chloride 101 - 111 mmol/L 107 102 105  CO2 22 - 32 mmol/L '26 25 25  ' Calcium 8.9 - 10.3 mg/dL 9.1 9.2 9.0  Total Protein 6.5 - 8.1 g/dL 7.0 6.9 6.9  Total Bilirubin 0.3 - 1.2 mg/dL 0.6 0.7 0.7  Alkaline Phos 38 - 126 U/L 75 72 67  AST 15 - 41 U/L '23 27 26  ' ALT 14 - 54 U/L '22 28 23    ' PENDING LABS:    DIAGNOSTIC IMAGING:  Most recent ECHO: 11/18/16                     *[address] [place], [place] [address]                            [phone]  ------------------------------------------------------------------- Transthoracic Echocardiography  Patient:    [name], [name] MR #:       [id number] Study Date: 11/18/2016 Gender:     F Age:        61 Height:     167.6 cm Weight:  69.6 kg BSA:        1.81 m^2 Pt. Status: Room:   SONOGRAPHER  [facility]  PERFORMING   [facility], Forestine Na  ATTENDING    Laurelyn Sickle, Elzie Rings W  REFERRING    Mike Craze W  cc:  ------------------------------------------------------------------- LV EF: 55% -   60%  ------------------------------------------------------------------- History:   PMH:  Palpitations, Breast Cancer, Mild Mitral Insufficency. Breast Cancer.  ------------------------------------------------------------------- Study Conclusions  - Left ventricle: The cavity size was normal. Wall thickness was   normal. Systolic function was normal. The estimated ejection   fraction was in the range of 55% to 60%. Wall motion was normal;   there were no regional wall motion abnormalities. Left   ventricular diastolic function parameters were normal. - Mitral valve: There was mild regurgitation. - Tricuspid valve: There was mild regurgitation.    (R) breast screening mammogram:  12/17/16 CLINICAL DATA:  Screening.  EXAM: 2D DIGITAL SCREENING UNILATERAL RIGHT MAMMOGRAM WITH CAD AND ADJUNCT TOMO  COMPARISON:  Previous exam(s).  ACR Breast Density Category b: There are scattered areas of fibroglandular density.  FINDINGS: There are no findings suspicious for malignancy. Images were processed with CAD.  IMPRESSION: No mammographic evidence of malignancy. A result letter of this screening mammogram will be mailed directly to the patient.  RECOMMENDATION: Screening mammogram in one year. (Code:SM-B-01Y)  BI-RADS CATEGORY  1: Negative.   Electronically Signed   By: Curlene Dolphin M.D.   On: 12/18/2016 08:03     PATHOLOGY:  Surgical path: 01/07/16           ASSESSMENT & PLAN:   Stage IA left breast invasive ductal carcinoma, ER-/PR-/HER2+: -s/p mastectomy and Taxotere/Carbo/Herceptin x 6 cycles; maintenance Herceptin every 3 weeks to total 1 year of therapy, which she completed on 01/01/17. She did not require adjuvant radiation therapy.  -No role for adjuvant anti-estrogen therapy given ER-.  -Last ECHO 11/18/16 normal with EF 55-60%.  -(R) breast screening mammogram done on 12/17/16 and negative for malignancy. Repeat right screening mammogram in 12/2017. -Clinically NED on breast exam today.  Port-a-cath:  -discussed removal of chemoport, however patient states she wants to keep it in for now since she is busy with getting her hysterectomy done in December. Discuss chemoport removal again on her next visit.   Dispo:  -Return to cancer center in 4 months with labs. Port flush q7month.  All questions were answered to patient's stated satisfaction. Encouraged patient to call with any new concerns or questions before her next visit to the cancer center and we can certain see her sooner, if needed.    Orders placed this encounter:  Orders Placed This Encounter  Procedures  . CBC with Differential  . Comprehensive metabolic panel    LTwana First MD

## 2017-03-23 ENCOUNTER — Other Ambulatory Visit (HOSPITAL_COMMUNITY): Payer: Self-pay

## 2017-03-23 DIAGNOSIS — C50412 Malignant neoplasm of upper-outer quadrant of left female breast: Secondary | ICD-10-CM

## 2017-03-23 MED ORDER — LIDOCAINE-PRILOCAINE 2.5-2.5 % EX CREA: TOPICAL_CREAM | CUTANEOUS | 3 refills | Status: DC

## 2017-03-23 NOTE — Telephone Encounter (Signed)
Received refill request from patients pharmacy for emla cream. Reviewed with provider, chart checked and refilled.

## 2017-03-24 ENCOUNTER — Ambulatory Visit: Payer: BLUE CROSS/BLUE SHIELD | Admitting: Nurse Practitioner

## 2017-04-03 HISTORY — PX: ABDOMINAL HYSTERECTOMY: SHX81

## 2017-04-15 ENCOUNTER — Other Ambulatory Visit: Payer: Self-pay | Admitting: Obstetrics and Gynecology

## 2017-04-15 DIAGNOSIS — N840 Polyp of corpus uteri: Secondary | ICD-10-CM | POA: Diagnosis not present

## 2017-04-15 DIAGNOSIS — N736 Female pelvic peritoneal adhesions (postinfective): Secondary | ICD-10-CM | POA: Diagnosis not present

## 2017-04-15 DIAGNOSIS — Z853 Personal history of malignant neoplasm of breast: Secondary | ICD-10-CM | POA: Diagnosis not present

## 2017-04-15 DIAGNOSIS — N809 Endometriosis, unspecified: Secondary | ICD-10-CM | POA: Diagnosis not present

## 2017-04-15 DIAGNOSIS — K660 Peritoneal adhesions (postprocedural) (postinfection): Secondary | ICD-10-CM | POA: Diagnosis not present

## 2017-04-15 DIAGNOSIS — N814 Uterovaginal prolapse, unspecified: Secondary | ICD-10-CM | POA: Diagnosis not present

## 2017-04-16 ENCOUNTER — Other Ambulatory Visit (HOSPITAL_COMMUNITY): Payer: Self-pay

## 2017-04-16 DIAGNOSIS — I340 Nonrheumatic mitral (valve) insufficiency: Secondary | ICD-10-CM

## 2017-04-16 DIAGNOSIS — R002 Palpitations: Secondary | ICD-10-CM

## 2017-04-16 MED ORDER — METOPROLOL TARTRATE 25 MG PO TABS: 25.0000 mg | ORAL_TABLET | Freq: Two times a day (BID) | ORAL | 2 refills | Status: DC

## 2017-04-16 NOTE — Telephone Encounter (Signed)
Received refill request from patients pharmacy for Metoprolol. Reviewed with provider, chart checked and refilled.

## 2017-05-20 ENCOUNTER — Inpatient Hospital Stay (HOSPITAL_COMMUNITY): Payer: BLUE CROSS/BLUE SHIELD | Attending: Internal Medicine

## 2017-05-20 ENCOUNTER — Encounter (HOSPITAL_COMMUNITY): Payer: Self-pay

## 2017-05-20 DIAGNOSIS — Z171 Estrogen receptor negative status [ER-]: Secondary | ICD-10-CM | POA: Diagnosis not present

## 2017-05-20 DIAGNOSIS — C50412 Malignant neoplasm of upper-outer quadrant of left female breast: Secondary | ICD-10-CM | POA: Insufficient documentation

## 2017-05-20 DIAGNOSIS — Z9221 Personal history of antineoplastic chemotherapy: Secondary | ICD-10-CM | POA: Insufficient documentation

## 2017-05-20 DIAGNOSIS — Z452 Encounter for adjustment and management of vascular access device: Secondary | ICD-10-CM | POA: Insufficient documentation

## 2017-05-20 MED ORDER — HEPARIN SOD (PORK) LOCK FLUSH 100 UNIT/ML IV SOLN
500.0000 [IU] | Freq: Once | INTRAVENOUS | Status: AC
  Administered 2017-05-20: 500 [IU] via INTRAVENOUS

## 2017-05-20 MED ORDER — SODIUM CHLORIDE 0.9% FLUSH
10.0000 mL | INTRAVENOUS | Status: DC | PRN
  Administered 2017-05-20: 10 mL via INTRAVENOUS
  Filled 2017-05-20: qty 10

## 2017-05-20 NOTE — Patient Instructions (Signed)
[place] at [facility] Discharge Instructions  RECOMMENDATIONS MADE BY THE CONSULTANT AND ANY TEST RESULTS WILL BE SENT TO YOUR REFERRING PHYSICIAN Portacath flush per protocol today. Follow-up as scheduled. Call for any questionbs or concerns Thank you for choosing [place] at [facility] to provide your oncology and hematology care.  To afford each patient quality time with our provider, please arrive at least 15 minutes before your scheduled appointment time.    If you have a lab appointment with the [facility] please come in thru the  Main Entrance and check in at the main information desk  You need to re-schedule your appointment should you arrive 10 or more minutes late.  We strive to give you quality time with our providers, and arriving late affects you and other patients whose appointments are after yours.  Also, if you no show three or more times for appointments you may be dismissed from the clinic at the providers discretion.     Again, thank you for choosing [facility].  Our hope is that these requests will decrease the amount of time that you wait before being seen by our physicians.       _____________________________________________________________  Should you have questions after your visit to [facility], please contact our office at [phone] between the hours of 8:30 a.m. and 4:30 p.m.  Voicemails left after 4:30 p.m. will not be returned until the following business day.  For prescription refill requests, have your pharmacy contact our office.       Resources For Cancer Patients and their Caregivers ? American Cancer Society: Can assist with transportation, wigs, general needs, runs Look Good Feel Better.        (807)128-9692 ? Cancer Care: Provides financial assistance, online support groups, medication/co-pay assistance.  1-800-813-HOPE 367-113-9875) ? [place] Assists [place] cancer patients and their families through emotional , educational and financial support.  737-205-2128 ? [place] DSS Where to apply for food stamps, Medicaid and utility assistance. (262)502-6744 ? RCATS: Transportation to medical appointments. (815) 311-2736 ? Social Security Administration: May apply for disability if have a Stage IV cancer. (272) 562-4705 [phone]) 047-6376 ? [employer], Disability and Transit Services: Assists with nutrition, care and transit needs. [place] Support Programs: @10RELATIVEDAYS @ > Cancer Support Group  2nd Tuesday of the month 1pm-2pm, Journey Room  > Creative Journey  3rd Tuesday of the month 1130am-1pm, Journey Room  > Look Good Feel Better  1st Wednesday of the month 10am-12 noon, Journey Room (Call [place] to register [phone])

## 2017-05-20 NOTE — Progress Notes (Signed)
[name] tolerated portacath flush well without complaints or incident. Port accessed with 20 gauge needle with blood return noted then flushed with 10 ml NS and 5 ml Heparin easily per protocol then de-accessed. VSS Pt discharged self ambulatory in satisfactory condition

## 2017-07-16 ENCOUNTER — Inpatient Hospital Stay (HOSPITAL_COMMUNITY): Payer: BLUE CROSS/BLUE SHIELD | Attending: Internal Medicine | Admitting: Internal Medicine

## 2017-07-16 ENCOUNTER — Inpatient Hospital Stay (HOSPITAL_COMMUNITY): Payer: BLUE CROSS/BLUE SHIELD

## 2017-07-16 ENCOUNTER — Encounter (HOSPITAL_COMMUNITY): Payer: Self-pay | Admitting: Internal Medicine

## 2017-07-16 VITALS — BP 131/57 | HR 80 | Temp 98.5°F | Resp 18 | Wt 166.8 lb

## 2017-07-16 DIAGNOSIS — Z9012 Acquired absence of left breast and nipple: Secondary | ICD-10-CM | POA: Diagnosis not present

## 2017-07-16 DIAGNOSIS — C50412 Malignant neoplasm of upper-outer quadrant of left female breast: Secondary | ICD-10-CM | POA: Insufficient documentation

## 2017-07-16 DIAGNOSIS — K219 Gastro-esophageal reflux disease without esophagitis: Secondary | ICD-10-CM | POA: Diagnosis not present

## 2017-07-16 DIAGNOSIS — Z803 Family history of malignant neoplasm of breast: Secondary | ICD-10-CM | POA: Insufficient documentation

## 2017-07-16 DIAGNOSIS — Z90710 Acquired absence of both cervix and uterus: Secondary | ICD-10-CM | POA: Diagnosis not present

## 2017-07-16 DIAGNOSIS — Z801 Family history of malignant neoplasm of trachea, bronchus and lung: Secondary | ICD-10-CM

## 2017-07-16 DIAGNOSIS — Z9221 Personal history of antineoplastic chemotherapy: Secondary | ICD-10-CM | POA: Diagnosis not present

## 2017-07-16 DIAGNOSIS — M797 Fibromyalgia: Secondary | ICD-10-CM | POA: Diagnosis not present

## 2017-07-16 DIAGNOSIS — Z79899 Other long term (current) drug therapy: Secondary | ICD-10-CM

## 2017-07-16 DIAGNOSIS — Z800 Family history of malignant neoplasm of digestive organs: Secondary | ICD-10-CM | POA: Insufficient documentation

## 2017-07-16 DIAGNOSIS — Z886 Allergy status to analgesic agent status: Secondary | ICD-10-CM | POA: Insufficient documentation

## 2017-07-16 DIAGNOSIS — Z885 Allergy status to narcotic agent status: Secondary | ICD-10-CM | POA: Diagnosis not present

## 2017-07-16 DIAGNOSIS — Z171 Estrogen receptor negative status [ER-]: Principal | ICD-10-CM

## 2017-07-16 LAB — CBC WITH DIFFERENTIAL/PLATELET
BASOS ABS: 0 10*3/uL (ref 0.0–0.1)
BASOS PCT: 0 %
Eosinophils Absolute: 0.2 10*3/uL (ref 0.0–0.7)
Eosinophils Relative: 3 %
HCT: 44.3 % (ref 36.0–46.0)
Hemoglobin: 13.8 g/dL (ref 12.0–15.0)
Lymphocytes Relative: 45 %
Lymphs Abs: 2.6 10*3/uL (ref 0.7–4.0)
MCH: 30.3 pg (ref 26.0–34.0)
MCHC: 31.2 g/dL (ref 30.0–36.0)
MCV: 97.4 fL (ref 78.0–100.0)
Monocytes Absolute: 0.4 10*3/uL (ref 0.1–1.0)
Monocytes Relative: 7 %
NEUTROS PCT: 45 %
Neutro Abs: 2.7 10*3/uL (ref 1.7–7.7)
Platelets: 259 10*3/uL (ref 150–400)
RBC: 4.55 MIL/uL (ref 3.87–5.11)
RDW: 12.5 % (ref 11.5–15.5)
WBC: 5.9 10*3/uL (ref 4.0–10.5)

## 2017-07-16 LAB — COMPREHENSIVE METABOLIC PANEL
ALBUMIN: 4.1 g/dL (ref 3.5–5.0)
ALK PHOS: 78 U/L (ref 38–126)
ALT: 22 U/L (ref 14–54)
ANION GAP: 12 (ref 5–15)
AST: 27 U/L (ref 15–41)
BILIRUBIN TOTAL: 0.7 mg/dL (ref 0.3–1.2)
BUN: 14 mg/dL (ref 6–20)
CALCIUM: 9.8 mg/dL (ref 8.9–10.3)
CO2: 25 mmol/L (ref 22–32)
Chloride: 104 mmol/L (ref 101–111)
Creatinine, Ser: 0.66 mg/dL (ref 0.44–1.00)
Glucose, Bld: 78 mg/dL (ref 65–99)
Potassium: 3.9 mmol/L (ref 3.5–5.1)
Sodium: 141 mmol/L (ref 135–145)
TOTAL PROTEIN: 7.5 g/dL (ref 6.5–8.1)

## 2017-07-16 LAB — LACTATE DEHYDROGENASE: LDH: 132 U/L (ref 98–192)

## 2017-07-16 NOTE — Patient Instructions (Signed)
[place] at [facility] Discharge Instructions   You were seen today by Dr. Zoila Shutter She discussed how you are feeling and your bone pain. Your mammogram is due in August of this year. Try taking a multivitamin daily to help with your energy. Port flush and labs today. We will see you back for follow up in August after your mammogram.   Thank you for choosing [place] at [facility] to provide your oncology and hematology care.  To afford each patient quality time with our provider, please arrive at least 15 minutes before your scheduled appointment time.    If you have a lab appointment with the [facility] please come in thru the  Main Entrance and check in at the main information desk  You need to re-schedule your appointment should you arrive 10 or more minutes late.  We strive to give you quality time with our providers, and arriving late affects you and other patients whose appointments are after yours.  Also, if you no show three or more times for appointments you may be dismissed from the clinic at the providers discretion.     Again, thank you for choosing [facility]  Our hope is that these requests will decrease the amount of time that you wait before being seen by our physicians.       _____________________________________________________________  Should you have questions after your visit to [facility], please contact our office at [phone] between the hours of 8:30 a.m. and 4:30 p.m.  Voicemails left after 4:30 p.m. will not be returned until the following business day.  For prescription refill requests, have your pharmacy contact our office.       Resources For Cancer Patients and their Caregivers ? American Cancer Society: Can assist with transportation, wigs, general needs, runs Look Good Feel Better.        443-042-2463 ? Cancer Care: Provides financial assistance, online  support groups, medication/co-pay assistance.  1-800-813-HOPE (510)356-7835) ? [place] Assists [place] cancer patients and their families through emotional , educational and financial support.  828-189-4986 ? [place] DSS Where to apply for food stamps, Medicaid and utility assistance. 339-118-4181 ? RCATS: Transportation to medical appointments. (609) 122-6672 ? Social Security Administration: May apply for disability if have a Stage IV cancer. (646)069-1406 (807) 052-1665 ? [employer], Disability and Transit Services: Assists with nutrition, care and transit needs. [place] Support Programs:   > Cancer Support Group  2nd Tuesday of the month 1pm-2pm, Journey Room   > Creative Journey  3rd Tuesday of the month 1130am-1pm, Journey Room

## 2017-07-16 NOTE — Progress Notes (Signed)
[name] presented for Portacath access and flush. Portacath located right chest wall accessed with  H 20 needle. No blood return and flushed without resistance Portacath flushed with 65ml NS and 500U/66ml Heparin and needle removed intact. Procedure without incident. Patient tolerated procedure well.  Labs drawn by venipunture.

## 2017-08-11 ENCOUNTER — Ambulatory Visit (HOSPITAL_COMMUNITY)
Admission: RE | Admit: 2017-08-11 | Discharge: 2017-08-11 | Disposition: A | Discharge status: Home / Self Care | Payer: BLUE CROSS/BLUE SHIELD | Source: Ambulatory Visit | Attending: Family Medicine | Admitting: Family Medicine

## 2017-08-11 ENCOUNTER — Other Ambulatory Visit (HOSPITAL_COMMUNITY): Payer: Self-pay | Admitting: Family Medicine

## 2017-08-11 DIAGNOSIS — M940 Chondrocostal junction syndrome [Tietze]: Secondary | ICD-10-CM | POA: Insufficient documentation

## 2017-08-11 DIAGNOSIS — R918 Other nonspecific abnormal finding of lung field: Secondary | ICD-10-CM | POA: Insufficient documentation

## 2017-08-11 DIAGNOSIS — E663 Overweight: Secondary | ICD-10-CM | POA: Diagnosis not present

## 2017-08-11 DIAGNOSIS — X58XXXA Exposure to other specified factors, initial encounter: Secondary | ICD-10-CM | POA: Diagnosis not present

## 2017-08-11 DIAGNOSIS — R9431 Abnormal electrocardiogram [ECG] [EKG]: Secondary | ICD-10-CM | POA: Diagnosis not present

## 2017-08-11 DIAGNOSIS — R000 Tachycardia, unspecified: Secondary | ICD-10-CM | POA: Diagnosis not present

## 2017-08-11 DIAGNOSIS — Z6826 Body mass index (BMI) 26.0-26.9, adult: Secondary | ICD-10-CM | POA: Diagnosis not present

## 2017-08-11 DIAGNOSIS — S299XXA Unspecified injury of thorax, initial encounter: Secondary | ICD-10-CM | POA: Diagnosis not present

## 2017-08-11 DIAGNOSIS — J069 Acute upper respiratory infection, unspecified: Secondary | ICD-10-CM | POA: Diagnosis not present

## 2017-08-11 DIAGNOSIS — R079 Chest pain, unspecified: Secondary | ICD-10-CM | POA: Diagnosis not present

## 2017-08-14 NOTE — Progress Notes (Signed)
Diagnosis Malignant neoplasm of upper-outer quadrant of left breast in female, estrogen receptor negative ([place]) - Plan: CBC with Differential/Platelet, Comprehensive metabolic panel, Lactate dehydrogenase  Staging Cancer Staging Breast cancer of upper-outer quadrant of left female breast [facility]) Staging form: Breast, AJCC 7th Edition - Pathologic stage from 01/08/2016: Stage IA (T1c, N0, cM0) - Signed by Baird Cancer, PA-C on 01/22/2016   Assessment and Plan:  1.  Stage IA left breast invasive ductal carcinoma, ER-/PR-/HER2+.  62 year old female s/p mastectomy and Taxotere/Carbo/Herceptin x 6 cycles; maintenance Herceptin every 3 weeks to total 1 year of therapy, which she completed on 01/01/17. She did not require adjuvant radiation therapy. Pt was no recommended for adjuvant anti-estrogen therapy given ER-.  -Last ECHO 11/18/16 normal with EF 55-60%.  -(R) breast screening mammogram done on 12/17/16 and negative for malignancy. Repeat right screening mammogram in 12/2017.  Pt will RTC in 12/2017 to go over mammogram.   2.  Joint pain.   Pt has history of fibromyalgia.  Labs done 07/16/2017 showed normal alk phos.  IF symptoms worsen will consider Bone scan for further evaluation.    3.  Port-a-cath.  Pt is due for port flush.  She has previously been recommended for PAC removal.   -discussed removal of chemoport, however patient states she wants to keep it in for now since she is busy with getting her hysterectomy done in December. Discuss chemoport removal again on her next visit.    Current Status:  Pt is seen today for follow-up.  She is reporting some joint pain.      Breast cancer of upper-outer quadrant of left female breast ([place])   11/07/1999 Surgery    Lumpectomy/sentinel LN biopsy invasive ductal carcinoma and DCIS at [facility], 0/6 sentinel LN      11/10/1999 - 01/08/2005 Anti-estrogen oral therapy    Tamoxifen 20 mg daily      11/24/1999 - 01/02/2000 Radiation Therapy          12/18/2015 Mammogram    2D digital screening bilateral mammogram with CAD and adjunct TOMO, in the L breast a possible mass warrants further evaluation. In R breast no findings suspicious for malignancy      12/24/2015 Pathology Results    Invasive ductal carcinoma Grade 3 Left core needle biopsy 1:30 o clock ER- PR- Ki67 70%, HER 2 positive      12/24/2015 Imaging    L breast Ultrasound, notes palpable area of concern demonstrates a hypoechoic irregular mass at 1:30, 7 cm from the nipple measuring 1.3 x 0.9 x 1.4 cm. No evidence of L axillary LAD      01/07/2016 Procedure    Left simple mastectomy by Dr. Georgette Dover      01/08/2016 Pathology Results    Breast, simple mastectomy, Left - INVASIVE DUCTAL CARCINOMA, GRADE III/III, SPANNING 1.4 CM. - THE SURGICAL RESECTION MARGINS ARE NEGATIVE FOR CARCINOMA.      01/23/2016 Genetic Testing    Patient refused genetic testing at time of consultation with Roma Kayser.  "Despite our recommendation, Ms. Ivy did not wish to pursue genetic testing at today's visit."      01/28/2016 Echocardiogram    Left ventricle: The cavity size was normal. Wall thickness was normal. Systolic function was normal. The estimated ejection fraction was in the range of 55% to 60%. Wall motion was normal; there were no regional wall motion abnormalities. Doppler parameters are consistent with abnormal left ventricular relaxation (grade 1 diastolic dysfunction).  01/31/2016 - 05/15/2016 Chemotherapy    The patient had palonosetron (ALOXI) injection 0.25 mg, 0.25 mg, Intravenous,  Once, 1 of 6 cycles  pegfilgrastim (NEULASTA ONPRO KIT) injection 6 mg, 6 mg, Subcutaneous, Once, 1 of 6 cycles  CARBOplatin (PARAPLATIN) 560 mg in sodium chloride 0.9 % 250 mL chemo infusion, 560 mg (100 % of original dose 558.5 mg), Intravenous,  Once, 1 of 6 cycles Dose modification:   (original dose 558.5 mg, Cycle 1),   (original dose 558.5 mg, Cycle 2)  DOCEtaxel  (TAXOTERE) 140 mg in dextrose 5 % 250 mL chemo infusion, 75 mg/m2 = 140 mg, Intravenous,  Once, 1 of 6 cycles  for chemotherapy treatment.        01/31/2016 - 01/01/2017 Antibody Plan    Herceptin x 52 weeks      08/24/2016 Echocardiogram    Left ventricle: The cavity size was normal. Wall thickness was normal. Systolic function was normal. The estimated ejection fraction was in the range of 55% to 60%. Wall motion was normal; there were no regional wall motion abnormalities. Doppler parameters are consistent with abnormal left ventricular relaxation (grade 1 diastolic dysfunction).      11/18/2016 Echocardiogram    LV EF: 55% -  60%. - Left ventricle: The cavity size was normal. Wall thickness was   normal. Systolic function was normal. The estimated ejection   fraction was in the range of 55% to 60%. Wall motion was normal;   there were no regional wall motion abnormalities. Left   ventricular diastolic function parameters were normal. - Mitral valve: There was mild regurgitation. - Tricuspid valve: There was mild regurgitation.        Problem List Patient Active Problem List   Diagnosis Date Noted  . Constipation [K59.00] 02/18/2017  . Heme + stool [R19.5] 02/18/2017  . Family history of breast cancer [Z80.3]   . Family history of colon cancer [Z80.0]   . Breast cancer of upper-outer quadrant of left female breast ([place]) [C50.412] 01/07/2016  . Sprain of foot, left [S93.602A] 01/15/2015  . Sprain of foot [S93.609A] 01/15/2015  . Sprained ankle [S93.409A] 01/15/2015  . Mild mitral insufficiency [I34.0] 06/13/2013  . Palpitations [R00.2] 06/13/2013  . IMPINGEMENT SYNDROME [M75.80] 07/03/2009    Past Medical History Past Medical History:  Diagnosis Date  . Anxiety   . Breast cancer ([place])   . Breast cancer of upper-outer quadrant of left female breast ([place]) 01/07/2016  . Complication of anesthesia   . DDD (degenerative disc disease), cervical   . DDD  (degenerative disc disease), lumbar   . Depression   . Dysrhythmia    last cardiology ov 06-13-2013 in [facility], takes metoprolol for HR  . Family history of breast cancer   . Family history of colon cancer   . Fibromyalgia   . GERD (gastroesophageal reflux disease)   . Hematuria   . History of echocardiogram 2015   mild mital insufficiency, otherwise normal  . Leaky heart valve   . Multiple thyroid nodules   . PAC (premature atrial contraction)   . Palpitations   . PONV (postoperative nausea and vomiting)   . SOB (shortness of breath)   . Tachycardia     Past Surgical History Past Surgical History:  Procedure Laterality Date  . ABDOMINAL HYSTERECTOMY  04/2017   with ovaries  . BREAST LUMPECTOMY    . COLONOSCOPY     17 years ago by Dr. Laural Golden   . COLONOSCOPY N/A 03/09/2017   Procedure: COLONOSCOPY;  Surgeon: Danie Binder, MD;  Location: [facility];  Service: Endoscopy;  Laterality: N/A;  2:45pm  . LAPAROSCOPIC APPENDECTOMY N/A 11/10/2012   Procedure: APPENDECTOMY LAPAROSCOPIC;  Surgeon: Jamesetta So, MD;  Location: [facility] ORS;  Service: General;  Laterality: N/A;  . MASTECTOMY Left 01/2016  . MASTECTOMY W/ SENTINEL NODE BIOPSY Left 01/07/2016  . MASTECTOMY W/ SENTINEL NODE BIOPSY Left 01/07/2016   Procedure: LEFT TOTAL MASTECTOMY WITH LEFT AXILLARY SENTINEL LYMPH NODE BIOPSY;  Surgeon: Donnie Mesa, MD;  Location: [facility];  Service: General;  Laterality: Left;  . PORTACATH PLACEMENT Right 01/07/2016   IJ  . PORTACATH PLACEMENT Right 01/07/2016   Procedure: INSERTION PORT-A-CATH RIGHT INTERNAL JUGULAR WITH ULTRASOUND;  Surgeon: Donnie Mesa, MD;  Location: [facility];  Service: General;  Laterality: Right;    Family History Family History  Problem Relation Age of Onset  . Colon cancer Mother 16       deceased secondary to metastatic cancer  . Diabetes Father   . Diabetes Sister   . Alcohol abuse Brother   . Cancer Brother 53       lung cancer  . Cancer Brother        penile   . Breast cancer Maternal Aunt   . Cancer Other        unknown form of cancer     Social History  reports that she has never smoked. She has never used smokeless tobacco. She reports that she does not drink alcohol or use drugs.  Medications  Current Outpatient Medications:  .  acetaminophen (TYLENOL) 500 MG tablet, Take 500 mg by mouth every 6 (six) hours as needed for mild pain., Disp: , Rfl:  .  ALPRAZolam (XANAX) 0.5 MG tablet, Take 1 tablet (0.5 mg total) by mouth 3 (three) times daily as needed for anxiety., Disp: 60 tablet, Rfl: 1 .  antiseptic oral rinse (BIOTENE) LIQD, 15 mLs by Mouth Rinse route as needed for dry mouth., Disp: , Rfl:  .  Cholecalciferol (VITAMIN D3) 2000 units TABS, Take 2,000 Units by mouth every morning. , Disp: , Rfl:  .  cyclobenzaprine (FLEXERIL) 10 MG tablet, Take 10 mg by mouth 2 (two) times daily., Disp: , Rfl:  .  famotidine (PEPCID) 20 MG tablet, Take 20 mg by mouth daily., Disp: , Rfl:  .  lidocaine-prilocaine (EMLA) cream, Apply to affected area once prior to accessing port a cath., Disp: 30 g, Rfl: 3 .  meclizine (ANTIVERT) 25 MG tablet, Take 1 tablet (25 mg total) by mouth 3 (three) times daily as needed for dizziness., Disp: 30 tablet, Rfl: 0 .  metoprolol tartrate (LOPRESSOR) 25 MG tablet, Take 1 tablet (25 mg total) by mouth 2 (two) times daily. 25 mg in the morning. 12.5 mg in the evening,, Disp: 60 tablet, Rfl: 2 .  omeprazole (PRILOSEC) 20 MG capsule, Take 1 capsule (20 mg total) by mouth 2 (two) times daily., Disp: 60 capsule, Rfl: 6 .  Polyethyl Glycol-Propyl Glycol (SYSTANE) 0.4-0.3 % SOLN, Apply 1 drop to eye at bedtime., Disp: , Rfl:  No current facility-administered medications for this visit.   Facility-Administered Medications Ordered in Other Visits:  .  sodium chloride flush (NS) 0.9 % injection 10 mL, 10 mL, Intracatheter, PRN, [place], Kelby Fam, MD  Allergies Latex; Naprosyn [naproxen]; Other; and Codeine  Review of  Systems Review of Systems - Oncology ROS as per HPI otherwise 12 point ROS is negative.   Physical Exam  Vitals Wt Readings from Last  3 Encounters:  07/16/17 166 lb 12.8 oz (75.7 kg)  03/18/17 155 lb 8 oz (70.5 kg)  03/09/17 155 lb (70.3 kg)   Temp Readings from Last 3 Encounters:  07/16/17 98.5 F (36.9 C) (Oral)  05/20/17 98.2 F (36.8 C) (Oral)  03/18/17 98.6 F (37 C) (Oral)   BP Readings from Last 3 Encounters:  07/16/17 (!) 131/57  05/20/17 124/70  03/18/17 107/62   Pulse Readings from Last 3 Encounters:  07/16/17 80  05/20/17 72  03/18/17 76   Constitutional: Well-developed, well-nourished, and in no distress.   HENT: Head: Normocephalic and atraumatic.  Mouth/Throat: No oropharyngeal exudate. Mucosa moist. Eyes: Pupils are equal, round, and reactive to light. Conjunctivae are normal. No scleral icterus.  Neck: Normal range of motion. Neck supple. No JVD present.  Cardiovascular: Normal rate, regular rhythm and normal heart sounds.  Exam reveals no gallop and no friction rub.   No murmur heard. Pulmonary/Chest: Effort normal and breath sounds normal. No respiratory distress. No wheezes.No rales.  Abdominal: Soft. Bowel sounds are normal. No distension. There is no tenderness. There is no guarding.  Musculoskeletal: No edema or tenderness.  Lymphadenopathy: No cervical, axillary or supraclavicular adenopathy.  Neurological: Alert and oriented to person, place, and time. No cranial nerve deficit.  Skin: Skin is warm and dry. No rash noted. No erythema. No pallor.  Psychiatric: Affect and judgment normal. Bilateral breast exam:  Chaperone present.  Right breast shows no dominant masses.  Left chest wall no signs of  Scar recurrence.     Labs Infusion on 07/16/2017  Component Date Value Ref Range Status  . LDH 07/16/2017 132  98 - 192 U/L Final   Performed at [facility], [address] [place], [place] [address]  . Sodium 07/16/2017 141  135 - 145 mmol/L  Final  . Potassium 07/16/2017 3.9  3.5 - 5.1 mmol/L Final  . Chloride 07/16/2017 104  101 - 111 mmol/L Final  . CO2 07/16/2017 25  22 - 32 mmol/L Final  . Glucose, Bld 07/16/2017 78  65 - 99 mg/dL Final  . BUN 07/16/2017 14  6 - 20 mg/dL Final  . Creatinine, Ser 07/16/2017 0.66  0.44 - 1.00 mg/dL Final  . Calcium 07/16/2017 9.8  8.9 - 10.3 mg/dL Final  . Total Protein 07/16/2017 7.5  6.5 - 8.1 g/dL Final  . Albumin 07/16/2017 4.1  3.5 - 5.0 g/dL Final  . AST 07/16/2017 27  15 - 41 U/L Final  . ALT 07/16/2017 22  14 - 54 U/L Final  . Alkaline Phosphatase 07/16/2017 78  38 - 126 U/L Final  . Total Bilirubin 07/16/2017 0.7  0.3 - 1.2 mg/dL Final  . GFR calc non Af Amer 07/16/2017 >60  >60 mL/min Final  . GFR calc Af Amer 07/16/2017 >60  >60 mL/min Final   Comment: (NOTE) The eGFR has been calculated using the CKD EPI equation. This calculation has not been validated in all clinical situations. eGFR's persistently <60 mL/min signify possible Chronic Kidney Disease.   Georgiann Hahn gap 07/16/2017 12  5 - 15 Final   Performed at [facility], [address] [place], [place] [address]  . WBC 07/16/2017 5.9  4.0 - 10.5 K/uL Final  . RBC 07/16/2017 4.55  3.87 - 5.11 MIL/uL Final  . Hemoglobin 07/16/2017 13.8  12.0 - 15.0 g/dL Final  . HCT 07/16/2017 44.3  36.0 - 46.0 % Final  . MCV 07/16/2017 97.4  78.0 - 100.0 fL Final  .  MCH 07/16/2017 30.3  26.0 - 34.0 pg Final  . MCHC 07/16/2017 31.2  30.0 - 36.0 g/dL Final  . RDW 07/16/2017 12.5  11.5 - 15.5 % Final  . Platelets 07/16/2017 259  150 - 400 K/uL Final  . Neutrophils Relative % 07/16/2017 45  % Final  . Neutro Abs 07/16/2017 2.7  1.7 - 7.7 K/uL Final  . Lymphocytes Relative 07/16/2017 45  % Final  . Lymphs Abs 07/16/2017 2.6  0.7 - 4.0 K/uL Final  . Monocytes Relative 07/16/2017 7  % Final  . Monocytes Absolute 07/16/2017 0.4  0.1 - 1.0 K/uL Final  . Eosinophils Relative 07/16/2017 3  % Final  . Eosinophils Absolute 07/16/2017 0.2  0.0 -  0.7 K/uL Final  . Basophils Relative 07/16/2017 0  % Final  . Basophils Absolute 07/16/2017 0.0  0.0 - 0.1 K/uL Final   Performed at [facility], [address] [place], [place] [address]     Pathology Orders Placed This Encounter  Procedures  . CBC with Differential/Platelet    Standing Status:   Future    Number of Occurrences:   1    Standing Expiration Date:   07/17/2018  . Comprehensive metabolic panel    Standing Status:   Future    Number of Occurrences:   1    Standing Expiration Date:   07/17/2018  . Lactate dehydrogenase    Standing Status:   Future    Number of Occurrences:   1    Standing Expiration Date:   07/17/2018       Zoila Shutter MD

## 2017-08-26 ENCOUNTER — Ambulatory Visit: Payer: Self-pay | Admitting: Surgery

## 2017-08-30 ENCOUNTER — Encounter (HOSPITAL_BASED_OUTPATIENT_CLINIC_OR_DEPARTMENT_OTHER): Payer: Self-pay | Admitting: *Deleted

## 2017-08-30 ENCOUNTER — Ambulatory Visit: Payer: Self-pay | Admitting: Surgery

## 2017-08-30 ENCOUNTER — Other Ambulatory Visit: Payer: Self-pay

## 2017-09-06 DIAGNOSIS — H04121 Dry eye syndrome of right lacrimal gland: Secondary | ICD-10-CM | POA: Diagnosis not present

## 2017-09-06 DIAGNOSIS — H16221 Keratoconjunctivitis sicca, not specified as Sjogren's, right eye: Secondary | ICD-10-CM | POA: Diagnosis not present

## 2017-09-07 ENCOUNTER — Ambulatory Visit (HOSPITAL_BASED_OUTPATIENT_CLINIC_OR_DEPARTMENT_OTHER): Payer: BLUE CROSS/BLUE SHIELD | Admitting: Anesthesiology

## 2017-09-07 ENCOUNTER — Encounter (HOSPITAL_BASED_OUTPATIENT_CLINIC_OR_DEPARTMENT_OTHER): Payer: Self-pay | Admitting: Anesthesiology

## 2017-09-07 ENCOUNTER — Other Ambulatory Visit: Payer: Self-pay

## 2017-09-07 ENCOUNTER — Encounter (HOSPITAL_BASED_OUTPATIENT_CLINIC_OR_DEPARTMENT_OTHER)
Admission: RE | Disposition: A | Discharge status: Home / Self Care | Payer: Self-pay | Source: Ambulatory Visit | Attending: Surgery

## 2017-09-07 ENCOUNTER — Ambulatory Visit (HOSPITAL_BASED_OUTPATIENT_CLINIC_OR_DEPARTMENT_OTHER)
Admission: RE | Admit: 2017-09-07 | Discharge: 2017-09-07 | Disposition: A | Discharge status: Home / Self Care | Payer: BLUE CROSS/BLUE SHIELD | Source: Ambulatory Visit | Attending: Surgery | Admitting: Surgery

## 2017-09-07 DIAGNOSIS — Z853 Personal history of malignant neoplasm of breast: Secondary | ICD-10-CM | POA: Diagnosis not present

## 2017-09-07 DIAGNOSIS — F419 Anxiety disorder, unspecified: Secondary | ICD-10-CM | POA: Insufficient documentation

## 2017-09-07 DIAGNOSIS — Z452 Encounter for adjustment and management of vascular access device: Secondary | ICD-10-CM | POA: Insufficient documentation

## 2017-09-07 DIAGNOSIS — C50412 Malignant neoplasm of upper-outer quadrant of left female breast: Secondary | ICD-10-CM | POA: Diagnosis not present

## 2017-09-07 DIAGNOSIS — Z9012 Acquired absence of left breast and nipple: Secondary | ICD-10-CM | POA: Insufficient documentation

## 2017-09-07 DIAGNOSIS — Z9221 Personal history of antineoplastic chemotherapy: Secondary | ICD-10-CM | POA: Insufficient documentation

## 2017-09-07 DIAGNOSIS — M1990 Unspecified osteoarthritis, unspecified site: Secondary | ICD-10-CM | POA: Diagnosis not present

## 2017-09-07 DIAGNOSIS — Z79899 Other long term (current) drug therapy: Secondary | ICD-10-CM | POA: Insufficient documentation

## 2017-09-07 DIAGNOSIS — K219 Gastro-esophageal reflux disease without esophagitis: Secondary | ICD-10-CM | POA: Diagnosis not present

## 2017-09-07 DIAGNOSIS — Z803 Family history of malignant neoplasm of breast: Secondary | ICD-10-CM | POA: Insufficient documentation

## 2017-09-07 DIAGNOSIS — I340 Nonrheumatic mitral (valve) insufficiency: Secondary | ICD-10-CM | POA: Diagnosis not present

## 2017-09-07 HISTORY — DX: Dental restoration status: Z98.811

## 2017-09-07 HISTORY — DX: Personal history of malignant neoplasm of breast: Z85.3

## 2017-09-07 HISTORY — DX: Other cervical disc displacement, unspecified cervical region: M50.20

## 2017-09-07 HISTORY — DX: Personal history of antineoplastic chemotherapy: Z92.21

## 2017-09-07 HISTORY — PX: PORT-A-CATH REMOVAL: SHX5289

## 2017-09-07 HISTORY — DX: Other cervical disc degeneration, unspecified cervical region: M50.30

## 2017-09-07 HISTORY — DX: Unspecified osteoarthritis, unspecified site: M19.90

## 2017-09-07 SURGERY — REMOVAL PORT-A-CATH
Anesthesia: Monitor Anesthesia Care | Site: Chest

## 2017-09-07 MED ORDER — METOCLOPRAMIDE HCL 5 MG/ML IJ SOLN: 10.0000 mg | Freq: Once | INTRAMUSCULAR | Status: DC | PRN

## 2017-09-07 MED ORDER — SCOPOLAMINE 1 MG/3DAYS TD PT72
MEDICATED_PATCH | TRANSDERMAL | Status: AC
  Filled 2017-09-07: qty 1

## 2017-09-07 MED ORDER — BUPIVACAINE-EPINEPHRINE 0.25% -1:200000 IJ SOLN
INTRAMUSCULAR | Status: DC | PRN
  Administered 2017-09-07: 8 mL

## 2017-09-07 MED ORDER — MIDAZOLAM HCL 2 MG/2ML IJ SOLN
1.0000 mg | INTRAMUSCULAR | Status: DC | PRN
  Administered 2017-09-07: 2 mg via INTRAVENOUS

## 2017-09-07 MED ORDER — PROPOFOL 500 MG/50ML IV EMUL
INTRAVENOUS | Status: DC | PRN
  Administered 2017-09-07: 50 ug/kg/min via INTRAVENOUS

## 2017-09-07 MED ORDER — ONDANSETRON HCL 4 MG/2ML IJ SOLN
INTRAMUSCULAR | Status: DC | PRN
  Administered 2017-09-07: 4 mg via INTRAVENOUS

## 2017-09-07 MED ORDER — CHLORHEXIDINE GLUCONATE CLOTH 2 % EX PADS: 6.0000 | MEDICATED_PAD | Freq: Once | CUTANEOUS | Status: DC

## 2017-09-07 MED ORDER — LIDOCAINE HCL (CARDIAC) PF 100 MG/5ML IV SOSY
PREFILLED_SYRINGE | INTRAVENOUS | Status: AC
  Filled 2017-09-07: qty 5

## 2017-09-07 MED ORDER — CEFAZOLIN SODIUM-DEXTROSE 2-4 GM/100ML-% IV SOLN
2.0000 g | INTRAVENOUS | Status: AC
  Administered 2017-09-07: 2 g via INTRAVENOUS

## 2017-09-07 MED ORDER — CEFAZOLIN SODIUM-DEXTROSE 2-4 GM/100ML-% IV SOLN
INTRAVENOUS | Status: AC
  Filled 2017-09-07: qty 100

## 2017-09-07 MED ORDER — FENTANYL CITRATE (PF) 100 MCG/2ML IJ SOLN: 25.0000 ug | INTRAMUSCULAR | Status: DC | PRN

## 2017-09-07 MED ORDER — ONDANSETRON HCL 4 MG/2ML IJ SOLN
INTRAMUSCULAR | Status: AC
  Filled 2017-09-07: qty 2

## 2017-09-07 MED ORDER — LACTATED RINGERS IV SOLN
INTRAVENOUS | Status: DC
  Administered 2017-09-07: 11:00:00 via INTRAVENOUS

## 2017-09-07 MED ORDER — PROPOFOL 10 MG/ML IV BOLUS
INTRAVENOUS | Status: AC
  Filled 2017-09-07: qty 40

## 2017-09-07 MED ORDER — FENTANYL CITRATE (PF) 100 MCG/2ML IJ SOLN
INTRAMUSCULAR | Status: AC
  Filled 2017-09-07: qty 2

## 2017-09-07 MED ORDER — MIDAZOLAM HCL 2 MG/2ML IJ SOLN
INTRAMUSCULAR | Status: AC
  Filled 2017-09-07: qty 2

## 2017-09-07 MED ORDER — MEPERIDINE HCL 25 MG/ML IJ SOLN
6.2500 mg | INTRAMUSCULAR | Status: DC | PRN
Start: 2017-09-07 — End: 2017-09-07

## 2017-09-07 MED ORDER — FENTANYL CITRATE (PF) 100 MCG/2ML IJ SOLN
50.0000 ug | INTRAMUSCULAR | Status: DC | PRN
  Administered 2017-09-07: 100 ug via INTRAVENOUS

## 2017-09-07 MED ORDER — SCOPOLAMINE 1 MG/3DAYS TD PT72
1.0000 | MEDICATED_PATCH | Freq: Once | TRANSDERMAL | Status: DC | PRN
  Administered 2017-09-07: 1.5 mg via TRANSDERMAL

## 2017-09-07 SURGICAL SUPPLY — 42 items
APL SKNCLS STERI-STRIP NONHPOA (GAUZE/BANDAGES/DRESSINGS) ×1
APL SWBSTK 6 STRL LF DISP (MISCELLANEOUS)
APPLICATOR COTTON TIP 6 STRL (MISCELLANEOUS) IMPLANT
APPLICATOR COTTON TIP 6IN STRL (MISCELLANEOUS)
BENZOIN TINCTURE PRP APPL 2/3 (GAUZE/BANDAGES/DRESSINGS) ×2 IMPLANT
BLADE HEX COATED 2.75 (ELECTRODE) ×2 IMPLANT
BLADE SURG 15 STRL LF DISP TIS (BLADE) ×1 IMPLANT
BLADE SURG 15 STRL SS (BLADE) ×2
CANISTER SUCT 1200ML W/VALVE (MISCELLANEOUS) IMPLANT
CHLORAPREP W/TINT 26ML (MISCELLANEOUS) ×2 IMPLANT
COVER BACK TABLE 60X90IN (DRAPES) ×2 IMPLANT
COVER MAYO STAND STRL (DRAPES) ×2 IMPLANT
DECANTER SPIKE VIAL GLASS SM (MISCELLANEOUS) ×2 IMPLANT
DRAPE LAPAROTOMY 100X72 PEDS (DRAPES) ×2 IMPLANT
DRAPE UTILITY XL STRL (DRAPES) ×2 IMPLANT
DRSG TEGADERM 2-3/8X2-3/4 SM (GAUZE/BANDAGES/DRESSINGS) ×1 IMPLANT
DRSG TEGADERM 4X4.75 (GAUZE/BANDAGES/DRESSINGS) ×1 IMPLANT
ELECT REM PT RETURN 9FT ADLT (ELECTROSURGICAL) ×2
ELECTRODE REM PT RTRN 9FT ADLT (ELECTROSURGICAL) ×1 IMPLANT
GAUZE SPONGE 4X4 12PLY STRL LF (GAUZE/BANDAGES/DRESSINGS) IMPLANT
GLOVE BIO SURGEON STRL SZ7 (GLOVE) ×1 IMPLANT
GLOVE BIOGEL PI IND STRL 7.5 (GLOVE) ×1 IMPLANT
GLOVE BIOGEL PI INDICATOR 7.5 (GLOVE) ×1
GLOVE SURG SS PI 7.0 STRL IVOR (GLOVE) ×2 IMPLANT
GOWN STRL REUS W/ TWL LRG LVL3 (GOWN DISPOSABLE) ×1 IMPLANT
GOWN STRL REUS W/TWL LRG LVL3 (GOWN DISPOSABLE) ×2
NDL HYPO 25X1 1.5 SAFETY (NEEDLE) ×1 IMPLANT
NEEDLE HYPO 25X1 1.5 SAFETY (NEEDLE) ×2 IMPLANT
NS IRRIG 1000ML POUR BTL (IV SOLUTION) IMPLANT
PACK BASIN DAY SURGERY FS (CUSTOM PROCEDURE TRAY) ×2 IMPLANT
PENCIL BUTTON HOLSTER BLD 10FT (ELECTRODE) ×2 IMPLANT
SPONGE GAUZE 2X2 8PLY STRL LF (GAUZE/BANDAGES/DRESSINGS) ×1 IMPLANT
SPONGE LAP 4X18 X RAY DECT (DISPOSABLE) ×2 IMPLANT
STRIP CLOSURE SKIN 1/2X4 (GAUZE/BANDAGES/DRESSINGS) ×2 IMPLANT
SUT MON AB 4-0 PC3 18 (SUTURE) ×2 IMPLANT
SUT VIC AB 3-0 SH 27 (SUTURE) ×2
SUT VIC AB 3-0 SH 27X BRD (SUTURE) ×1 IMPLANT
SYR CONTROL 10ML LL (SYRINGE) ×2 IMPLANT
TOWEL OR 17X24 6PK STRL BLUE (TOWEL DISPOSABLE) ×4 IMPLANT
TOWEL OR NON WOVEN STRL DISP B (DISPOSABLE) ×2 IMPLANT
TUBE CONNECTING 20X1/4 (TUBING) IMPLANT
YANKAUER SUCT BULB TIP NO VENT (SUCTIONS) IMPLANT

## 2017-09-07 NOTE — Discharge Instructions (Signed)
[facility] Office Phone Number [phone]   POST OP INSTRUCTIONS  Always review your discharge instruction sheet given to you by the facility where your surgery was performed.  IF YOU HAVE DISABILITY OR FAMILY LEAVE FORMS, YOU MUST BRING THEM TO THE OFFICE FOR PROCESSING.  DO NOT GIVE THEM TO YOUR DOCTOR.  1. You may take acetaminophen (Tylenol) or ibuprofen (Advil) as needed. 2. Take your usually prescribed medications unless otherwise directed 3. You should eat very light the first 24 hours after surgery, such as soup, crackers, pudding, etc.  Resume your normal diet the day after surgery. 4. Most patients will experience some swelling and bruising around the surgical site.  Ice packs will help.  Swelling and bruising can take several days to resolve.  5. You may remove your bandages 48 hours after surgery, and you may shower at that time.  You will have steri-strips (small skin tapes) in place directly over the incision.  These strips should be left on the skin for 7-10 days.   6. ACTIVITIES:  You may resume regular daily activities (gradually increasing) beginning the next day.   You may have sexual intercourse when it is comfortable. a. You may drive when you no longer are taking prescription pain medication, you can comfortably wear a seatbelt, and you can safely maneuver your car and apply brakes. b. RETURN TO WORK:  1-2 weeks 7. You should see your doctor in the office for a follow-up appointment approximately two to three weeks after your surgery.    WHEN TO CALL YOUR DOCTOR: 1. Fever over 101.0 2. Nausea and/or vomiting. 3. Extreme swelling or bruising. 4. Continued bleeding from incision. 5. Increased pain, redness, or drainage from the incision.  The clinic staff is available to answer your questions during regular business hours.  Please don't hesitate to call and ask to speak to one of the nurses for clinical concerns.  If you have a medical emergency, go to  the nearest emergency room or call 911.  A surgeon from [facility] is always on call at the hospital.  For further questions, please visit [email or web address]    Post Anesthesia Home Care Instructions  Activity: Get plenty of rest for the remainder of the day. A responsible individual must stay with you for 24 hours following the procedure.  For the next 24 hours, DO NOT: -Drive a car -Operate machinery -Drink alcoholic beverages -Take any medication unless instructed by your physician -Make any legal decisions or sign important papers.  Meals: Start with liquid foods such as gelatin or soup. Progress to regular foods as tolerated. Avoid greasy, spicy, heavy foods. If nausea and/or vomiting occur, drink only clear liquids until the nausea and/or vomiting subsides. Call your physician if vomiting continues.  Special Instructions/Symptoms: Your throat may feel dry or sore from the anesthesia or the breathing tube placed in your throat during surgery. If this causes discomfort, gargle with warm salt water. The discomfort should disappear within 24 hours.  If you had a scopolamine patch placed behind your ear for the management of post- operative nausea and/or vomiting:  1. The medication in the patch is effective for 72 hours, after which it should be removed.  Wrap patch in a tissue and discard in the trash. Wash hands thoroughly with soap and water. 2. You may remove the patch earlier than 72 hours if you experience unpleasant side effects which may include dry mouth, dizziness or visual disturbances. 3. Avoid touching the patch. Wash   your hands with soap and water after contact with the patch.       

## 2017-09-07 NOTE — Transfer of Care (Signed)
Immediate Anesthesia Transfer of Care Note  Patient: [name]  Procedure(s) Performed: REMOVAL PORT-A-CATH (N/A Chest)  Patient Location: PACU  Anesthesia Type:MAC  Level of Consciousness: awake, alert  and oriented  Airway & Oxygen Therapy: Patient Spontanous Breathing and Patient connected to face mask oxygen  Post-op Assessment: Report given to RN and Post -op Vital signs reviewed and stable  Post vital signs: Reviewed and stable  Last Vitals:  Vitals Value Taken Time  BP    Temp    Pulse 83 09/07/2017 11:44 AM  Resp    SpO2 100 % 09/07/2017 11:44 AM  Vitals shown include unvalidated device data.  Last Pain:  Vitals:   09/07/17 1030  TempSrc: Oral  PainSc: 3       Patients Stated Pain Goal: 3 (79/89/21 1941)  Complications: No apparent anesthesia complications

## 2017-09-07 NOTE — H&P (Signed)
[name] is an 62 y.o. female.   Chief Complaint: Breast cancer HPI: 62 yo female s/p left mastectomy and port placement 01/2016 for recurrent left breast cancer.  She has completed treatment and presents now for port removal.  Past Medical History:  Diagnosis Date  . Anxiety   . Arthritis    knees  . Bulging of cervical intervertebral disc   . Dental crowns present   . Depression   . Family history of breast cancer   . Family history of colon cancer   . Fibromyalgia   . GERD (gastroesophageal reflux disease)   . History of chemotherapy   . History of left breast cancer 01/2016  . Multiple thyroid nodules   . Palpitations   . PONV (postoperative nausea and vomiting)     Past Surgical History:  Procedure Laterality Date  . ABDOMINAL HYSTERECTOMY  04/2017   complete  . COLONOSCOPY N/A 03/09/2017   Procedure: COLONOSCOPY;  Surgeon: Danie Binder, MD;  Location: [facility];  Service: Endoscopy;  Laterality: N/A;  2:45pm  . LAPAROSCOPIC APPENDECTOMY N/A 11/10/2012   Procedure: APPENDECTOMY LAPAROSCOPIC;  Surgeon: Jamesetta So, MD;  Location: [facility];  Service: General;  Laterality: N/A;  . MASTECTOMY W/ SENTINEL NODE BIOPSY Left 01/07/2016   Procedure: LEFT TOTAL MASTECTOMY WITH LEFT AXILLARY SENTINEL LYMPH NODE BIOPSY;  Surgeon: Donnie Mesa, MD;  Location: [place];  Service: General;  Laterality: Left;  . PORTACATH PLACEMENT Right 01/07/2016   Procedure: INSERTION PORT-A-CATH RIGHT INTERNAL JUGULAR WITH ULTRASOUND;  Surgeon: Donnie Mesa, MD;  Location: [place];  Service: General;  Laterality: Right;    Family History  Problem Relation Age of Onset  . Colon cancer Mother 49       deceased secondary to metastatic cancer  . Diabetes Father   . Diabetes Sister   . Alcohol abuse Brother   . Cancer Brother 45       lung cancer  . Cancer Brother        penile  . Breast cancer Maternal Aunt   . Cancer Other        unknown form of cancer   Social History:  reports that  she has never smoked. She has never used smokeless tobacco. She reports that she does not drink alcohol or use drugs.  Allergies:  Allergies  Allergen Reactions  . Codeine Nausea And Vomiting  . Latex Itching and Rash  . Naprosyn [Naproxen] Itching and Rash    Medications Prior to Admission  Medication Sig Dispense Refill  . ALPRAZolam (XANAX) 0.5 MG tablet Take 0.5 mg by mouth 2 (two) times daily as needed for anxiety.    . Cholecalciferol (VITAMIN D3) 2000 units TABS Take 2,000 Units by mouth every morning.     . famotidine (PEPCID) 20 MG tablet Take 20 mg by mouth daily.    . metoprolol tartrate (LOPRESSOR) 25 MG tablet Take 1 tablet (25 mg total) by mouth 2 (two) times daily. 25 mg in the morning. 12.5 mg in the evening, 60 tablet 2    No results found for this or any previous visit (from the past 48 hour(s)). No results found.  Review of Systems  Constitutional: Negative for weight loss.  HENT: Negative for ear discharge, ear pain, hearing loss and tinnitus.   Eyes: Negative for blurred vision, double vision, photophobia and pain.  Respiratory: Negative for cough, sputum production and shortness of breath.   Cardiovascular: Negative for chest pain.  Gastrointestinal: Negative for  abdominal pain, nausea and vomiting.  Genitourinary: Negative for dysuria, flank pain, frequency and urgency.  Musculoskeletal: Negative for back pain, falls, joint pain, myalgias and neck pain.  Neurological: Negative for dizziness, tingling, sensory change, focal weakness, loss of consciousness and headaches.  Endo/Heme/Allergies: Does not bruise/bleed easily.  Psychiatric/Behavioral: Negative for depression, memory loss and substance abuse. The patient is not nervous/anxious.     Height 5\' 6"  (1.676 m), weight 74.8 kg (165 lb). Physical Exam  WDWn in NAD Right chest port  - no sign of infection Left mastectomy - well-healed  Assessment/Plan Left breast cancer s/p mastectomy  Removal of  port The surgical procedure has been discussed with the patient.  Potential risks, benefits, alternative treatments, and expected outcomes have been explained.  All of the patient's questions at this time have been answered.  The likelihood of reaching the patient's treatment goal is good.  The patient understand the proposed surgical procedure and wishes to proceed.   [name], MD 09/07/2017, 10:10 AM

## 2017-09-07 NOTE — Anesthesia Preprocedure Evaluation (Signed)
Anesthesia Evaluation    History of Anesthesia Complications (+) PONV and history of anesthetic complications  Airway Mallampati: II  TM Distance: >3 FB Neck ROM: Full    Dental no notable dental hx. (+) Caps,    Pulmonary neg pulmonary ROS,    Pulmonary exam normal breath sounds clear to auscultation       Cardiovascular Normal cardiovascular exam Rhythm:Regular Rate:Normal     Neuro/Psych PSYCHIATRIC DISORDERS Anxiety Depression  Neuromuscular disease    GI/Hepatic Neg liver ROS, GERD  Medicated and Controlled,  Endo/Other  Hx/o left breast Ca- S/P ChemoRx  Renal/GU negative Renal ROS  negative genitourinary   Musculoskeletal  (+) Arthritis , Osteoarthritis,  Fibromyalgia -  Abdominal (+) - obese,   Peds  Hematology negative hematology ROS (+)   Anesthesia Other Findings   Reproductive/Obstetrics                             Anesthesia Physical Anesthesia Plan  ASA: II  Anesthesia Plan: MAC   Post-op Pain Management:    Induction: Intravenous  PONV Risk Score and Plan: 3 and Propofol infusion, Ondansetron, Treatment may vary due to age or medical condition and Midazolam  Airway Management Planned: Natural Airway, Nasal Cannula and Simple Face Mask  Additional Equipment:   Intra-op Plan:   Post-operative Plan:   Informed Consent: I have reviewed the patients History and Physical, chart, labs and discussed the procedure including the risks, benefits and alternatives for the proposed anesthesia with the patient or authorized representative who has indicated his/her understanding and acceptance.   Dental advisory given  Plan Discussed with: CRNA, Anesthesiologist and Surgeon  Anesthesia Plan Comments:         Anesthesia Quick Evaluation

## 2017-09-07 NOTE — Op Note (Signed)
Preop diagnosis: Breast cancer status post mastectomy and chemotherapy Postop diagnosis: Same Procedure performed: Port removal Surgeon:Jeanett Antonopoulos K Delmos Velaquez Anesthesia: Local MAC Indications: This is a 62 year old female who is status post left mastectomy for recurrent left breast cancer.  She has completed chemotherapy.  She presents now for port removal.  Description of procedure: The patient is brought to the operating room and placed in the supine position on the operating room table.  After an adequate level of intravenous sedation was given, her right chest was prepped with ChloraPrep and draped sterile fashion.  A timeout was taken to ensure the proper patient and proper procedure.  We infiltrated the area over the mass with 0.25% Marcaine with epinephrine.  We opened her old incision.  Dissection was carried down through the subcutaneous tissues with cautery.  We identified the hope of the port which was grasped with hemostat.  We remove the catheter from the internal jugular vein.  Pressure was held over the insertion site for several minutes.  No sign of bleeding was noted.  We identified the 2 sutures holding the port in place.  We remove the sutures and the port was removed.  We excised the capsule of the fibrin sheath around the port.  The subcutaneous tissues were inspected for hemostasis.  We closed with a deep layer of 3-0 Vicryl and a subcuticular layer 4-0 Monocryl.  Benzoin and Steri-Strips were applied.  The patient was then awakened brought to the recovery room in stable condition.  All sponge, instrument, and needle counts are correct.  Imogene Burn. Georgette Dover, MD, [facility]  General/ Trauma Surgery  09/07/2017 11:44 AM

## 2017-09-07 NOTE — Anesthesia Postprocedure Evaluation (Signed)
Anesthesia Post Note  Patient: [name]  Procedure(s) Performed: REMOVAL PORT-A-CATH (N/A Chest)     Patient location during evaluation: PACU Anesthesia Type: MAC Level of consciousness: awake and alert and oriented Pain management: pain level controlled Vital Signs Assessment: post-procedure vital signs reviewed and stable Respiratory status: spontaneous breathing, nonlabored ventilation and respiratory function stable Cardiovascular status: stable and blood pressure returned to baseline Postop Assessment: no apparent nausea or vomiting Anesthetic complications: no    Last Vitals:  Vitals:   09/07/17 1145 09/07/17 1200  BP: 106/67 128/77  Pulse: 76 78  Resp: 13 12  Temp: 36.4 C   SpO2: 100% 100%    Last Pain:  Vitals:   09/07/17 1200  TempSrc:   PainSc: 2                  Sarya Linenberger A.

## 2017-09-08 ENCOUNTER — Other Ambulatory Visit: Payer: Self-pay | Admitting: Adult Health

## 2017-09-08 ENCOUNTER — Encounter (HOSPITAL_BASED_OUTPATIENT_CLINIC_OR_DEPARTMENT_OTHER): Payer: Self-pay | Admitting: Surgery

## 2017-09-08 DIAGNOSIS — Z1231 Encounter for screening mammogram for malignant neoplasm of breast: Secondary | ICD-10-CM

## 2017-09-14 ENCOUNTER — Other Ambulatory Visit (HOSPITAL_COMMUNITY): Payer: Self-pay

## 2017-09-14 DIAGNOSIS — I340 Nonrheumatic mitral (valve) insufficiency: Secondary | ICD-10-CM

## 2017-09-14 DIAGNOSIS — R002 Palpitations: Secondary | ICD-10-CM

## 2017-09-14 MED ORDER — METOPROLOL TARTRATE 25 MG PO TABS: 25.0000 mg | ORAL_TABLET | Freq: Two times a day (BID) | ORAL | 0 refills | Status: AC

## 2017-09-14 NOTE — Telephone Encounter (Signed)
Received refill request from patients pharmacy for metoprolol. Reviewed with provider, chart checked and refilled.

## 2017-09-15 ENCOUNTER — Encounter (HOSPITAL_COMMUNITY): Payer: BLUE CROSS/BLUE SHIELD

## 2017-09-23 ENCOUNTER — Other Ambulatory Visit (HOSPITAL_COMMUNITY): Payer: Self-pay | Admitting: Student

## 2017-09-23 DIAGNOSIS — M7989 Other specified soft tissue disorders: Principal | ICD-10-CM

## 2017-09-23 DIAGNOSIS — M79601 Pain in right arm: Secondary | ICD-10-CM

## 2017-09-24 ENCOUNTER — Ambulatory Visit (HOSPITAL_COMMUNITY)
Admission: RE | Admit: 2017-09-24 | Discharge: 2017-09-24 | Disposition: A | Discharge status: Home / Self Care | Payer: BLUE CROSS/BLUE SHIELD | Source: Ambulatory Visit | Attending: Surgery | Admitting: Surgery

## 2017-09-24 DIAGNOSIS — M79601 Pain in right arm: Secondary | ICD-10-CM

## 2017-09-24 DIAGNOSIS — M7989 Other specified soft tissue disorders: Secondary | ICD-10-CM

## 2017-09-24 DIAGNOSIS — M542 Cervicalgia: Secondary | ICD-10-CM | POA: Diagnosis not present

## 2017-09-24 NOTE — Progress Notes (Signed)
Right upper extremity venous duplex has been completed. Negative for DVT. Results were given to [place] at [facility] PA's office.  09/24/17 2:22 PM Carlos Levering RVT

## 2017-10-15 IMAGING — US US BREAST BX W LOC DEV 1ST LESION IMG BX SPEC US GUIDE*L*
1 series · 9 of 9 positions shown · non-contrast
Comparison: Previous exam(s).

ADDENDUM:
Pathology revealed grade III invasive ductal carcinoma in the left
breast. This was found to be concordant by Dr. Uliana Ravelo.
Pathology results were discussed with the patient by telephone. The
patient reported doing well after the biopsy with tenderness at the
site. Post biopsy instructions and care were reviewed and questions
were answered. The patient was encouraged to call The [REDACTED] for any additional concerns. The patient
requested surgical consultation in [HOSPITAL] and an appointment has
been made with Dr. Phamthi Starz at [REDACTED] on December 27, 2015.

Pathology results reported by Wingyee Cattle RN, BSN on 12/25/2015.
CLINICAL DATA: Left breast mass
EXAM:
ULTRASOUND GUIDED LEFT BREAST CORE NEEDLE BIOPSY

[Series 1: us breast bx w loc dev 1st lesion img bx spec us g · 0.07mm/px · 9 of 9 slices shown]
[im 1/9]
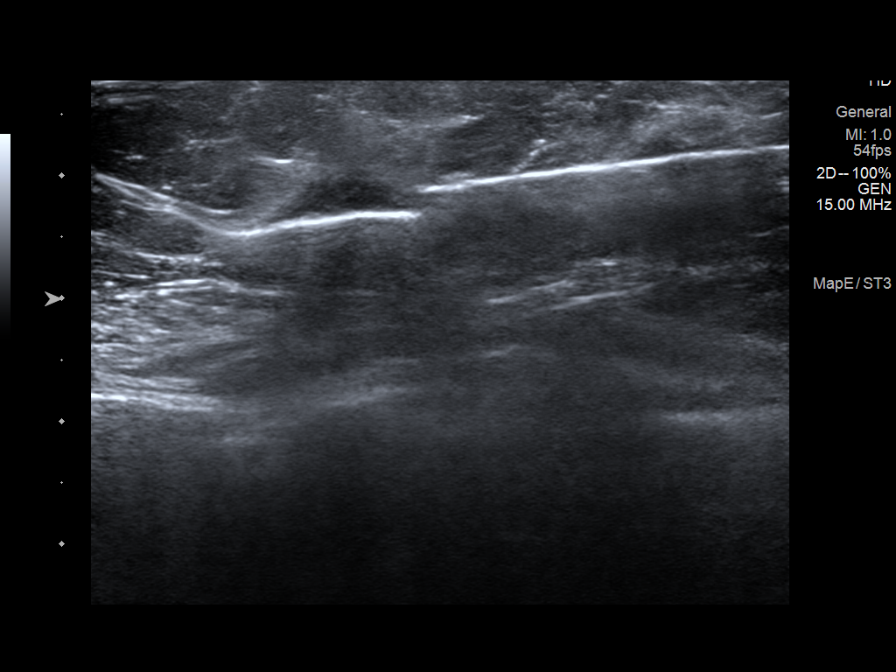
[im 2/9]
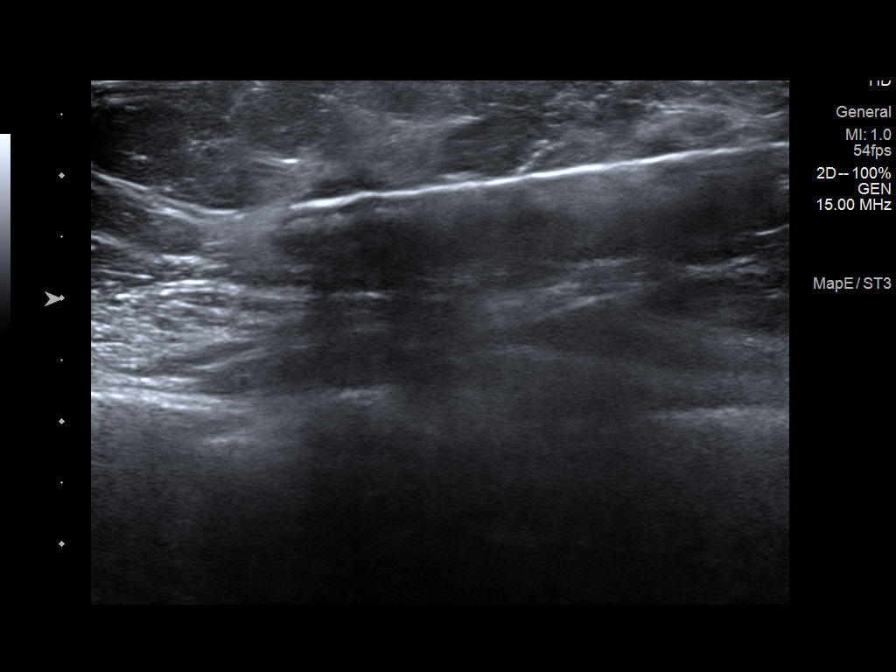
[im 3/9]
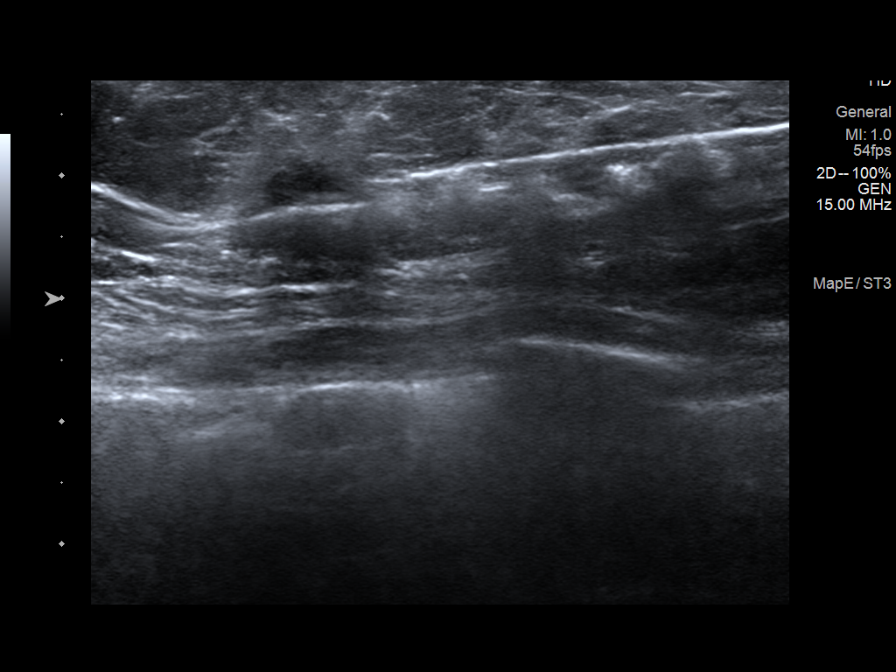
[im 4/9]
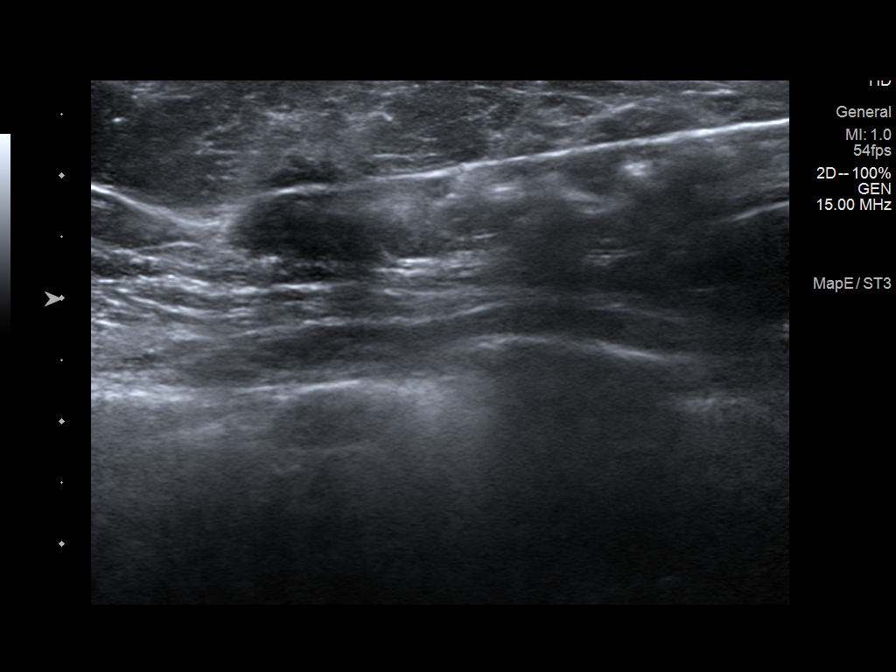
[im 5/9]
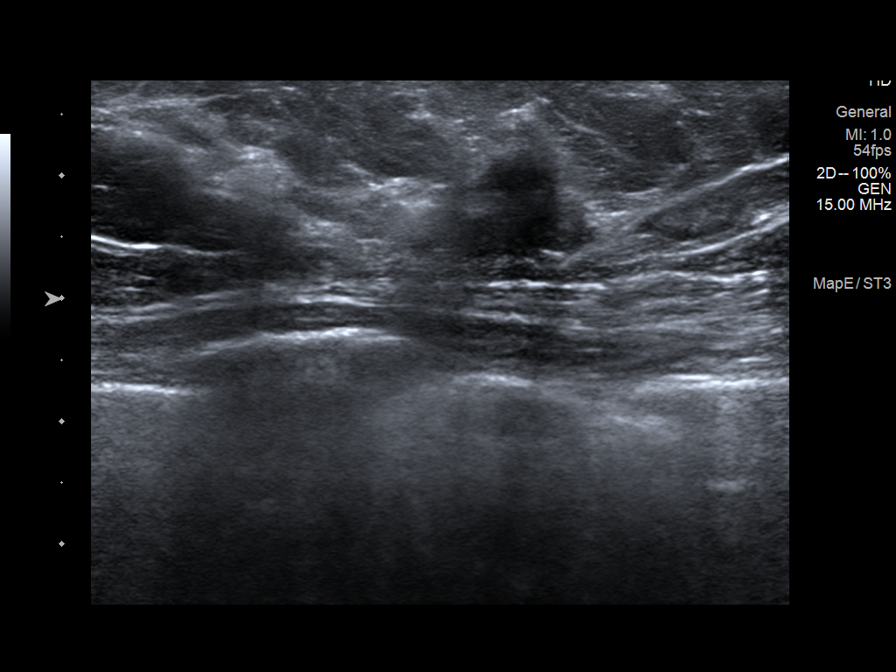
[im 6/9]
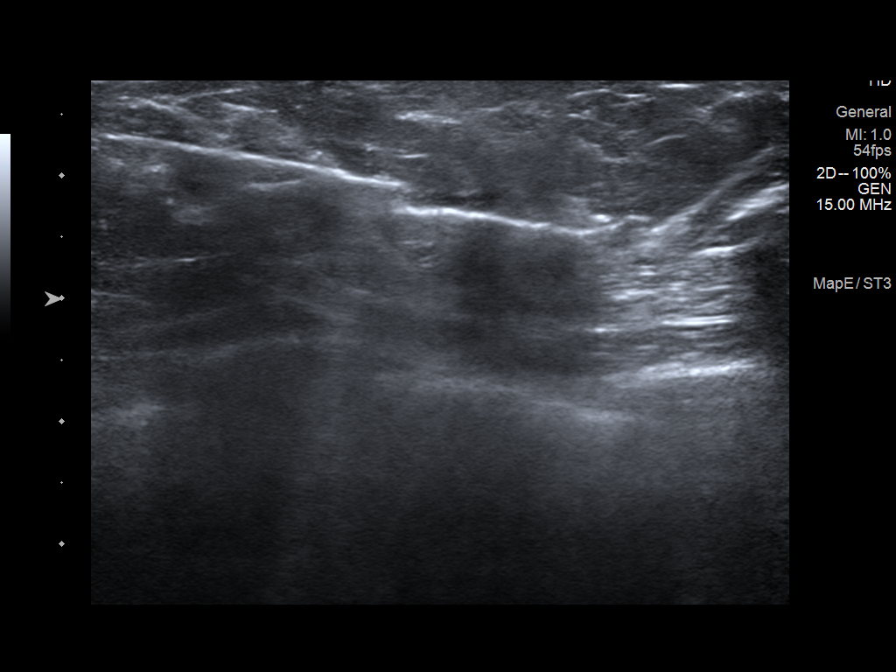
[im 7/9]
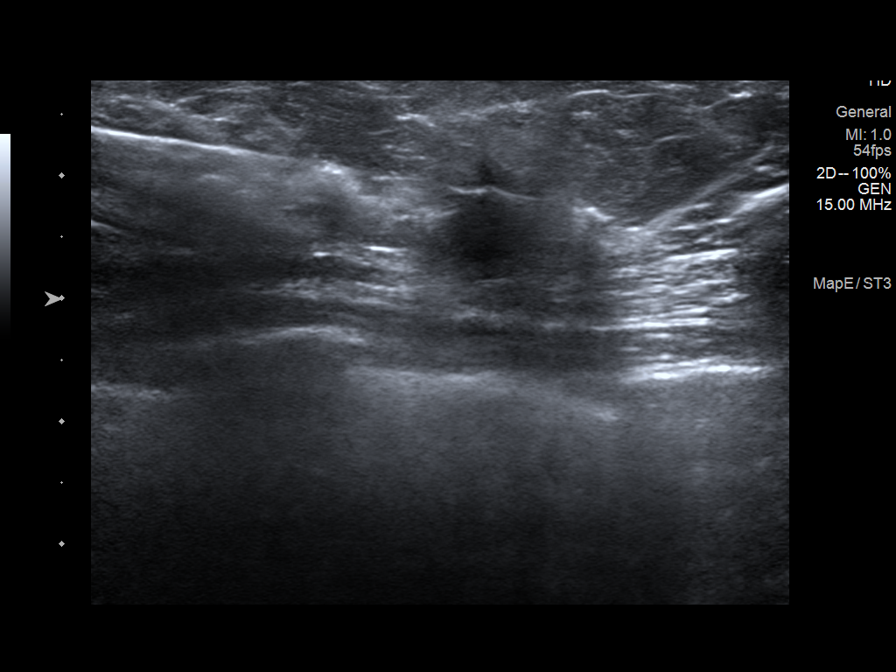
[im 8/9]
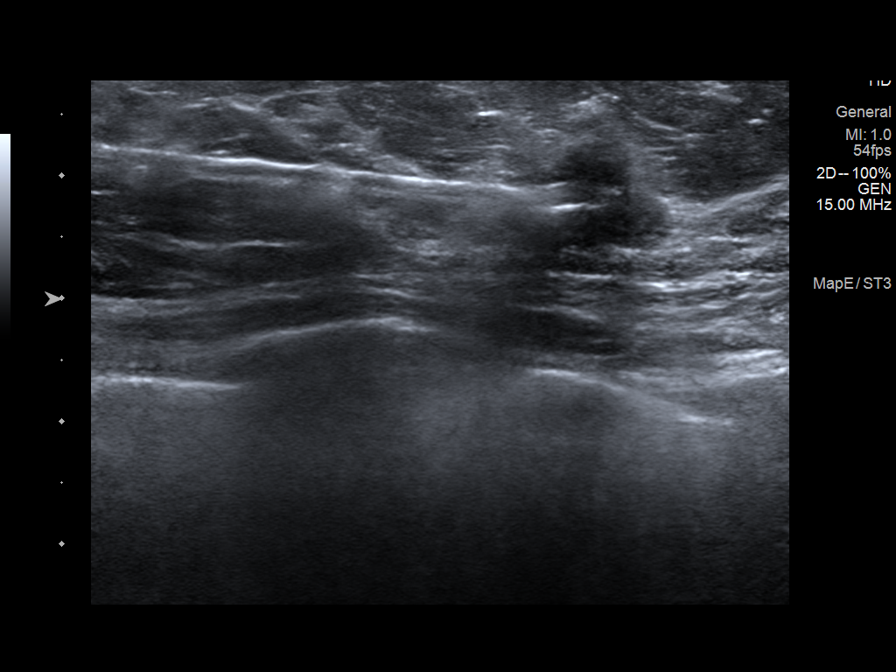
[im 9/9]
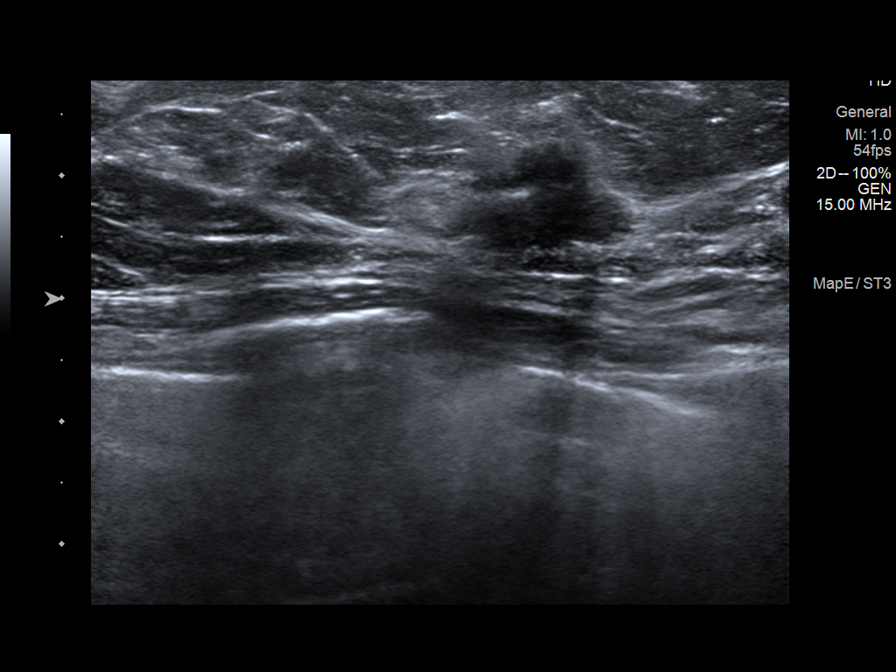

[9 of 9 positions shown; findings below may reference images not displayed]



Using sterile technique and 1% Lidocaine as local anesthetic, under
direct ultrasound visualization, a 12 gauge Ujembui device was
used to perform biopsy of a left breast mass using a lateral
approach. At the conclusion of the procedure a tissue marker clip
was deployed into the biopsy cavity. Follow up 2 view mammogram was
performed and dictated separately.
IMPRESSION: Ultrasound guided biopsy of a left breast mass. No apparent
complications.

## 2017-10-15 IMAGING — MG 2D DIGITAL DIAGNOSTIC UNILATERAL LEFT MAMMOGRAM WITH CAD AND ADJ
6 series · 6 of 14 positions shown · non-contrast
Comparison: Previous exam(s).

CLINICAL DATA: 60-year-old female presenting for evaluation of a
possible left breast mass identified on screening mammography. The
patient has history of a left breast cancer status post left breast
lumpectomy in 5228.

EXAM:
2D DIGITAL DIAGNOSTIC UNILATERAL LEFT MAMMOGRAM WITH CAD AND ADJUNCT
TOMO
LEFT BREAST ULTRASOUND

[L LM]
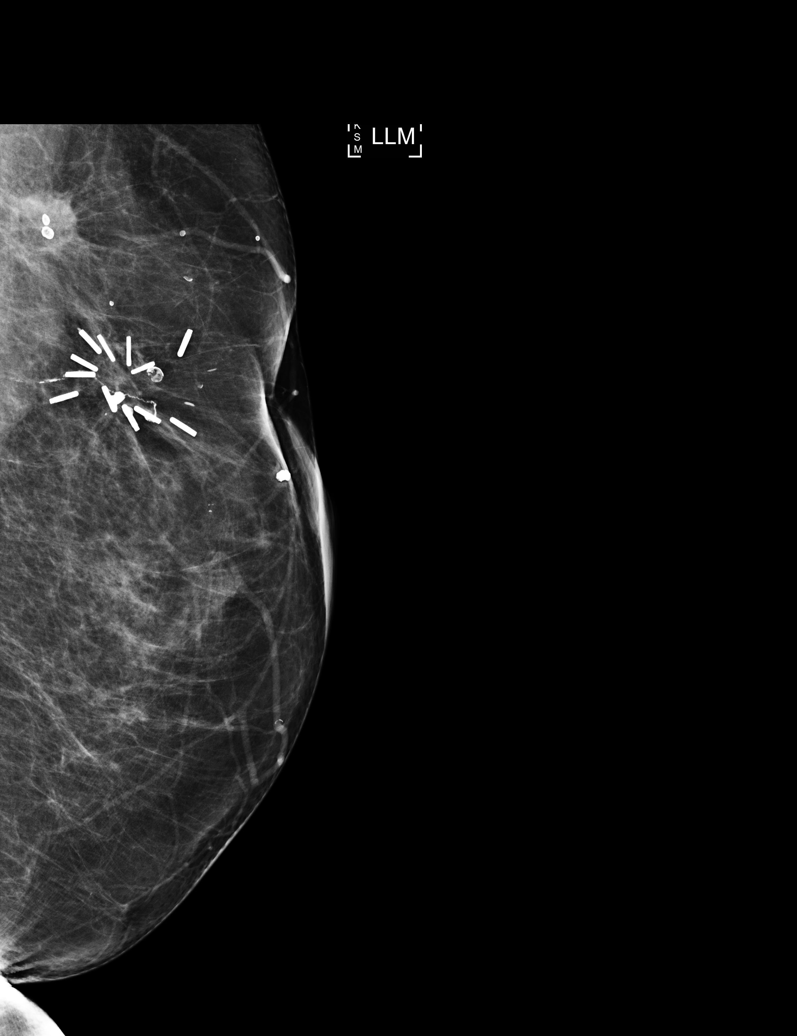

[L MLO]
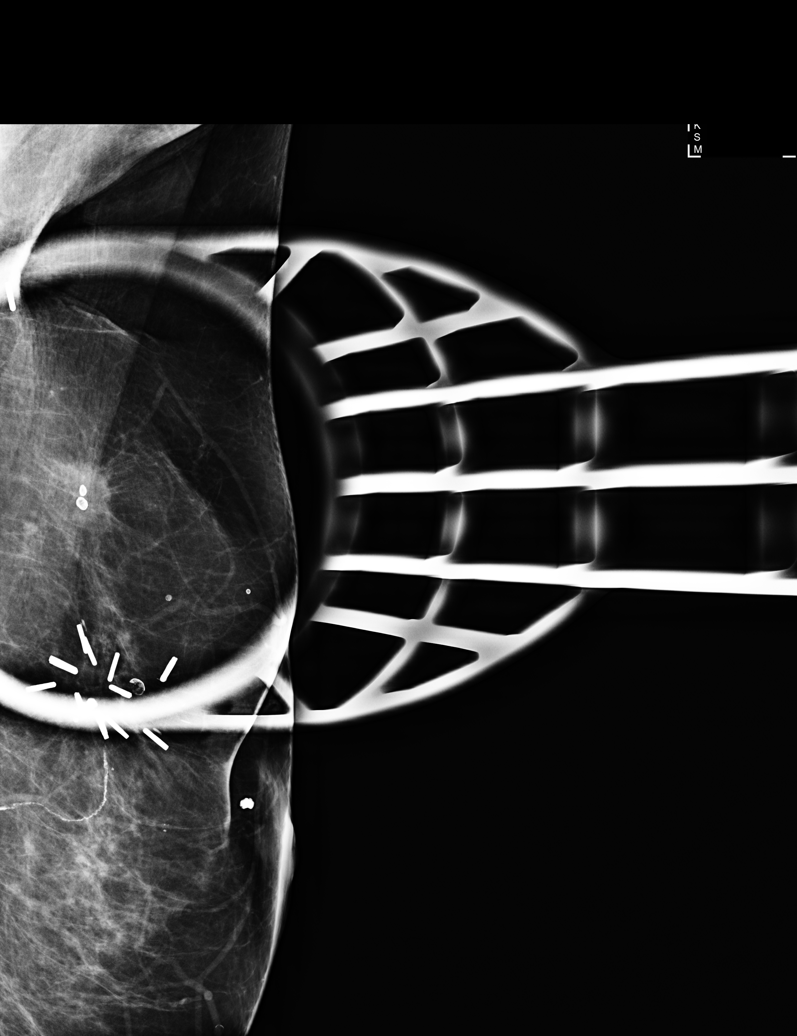

[L LM synth-2D]
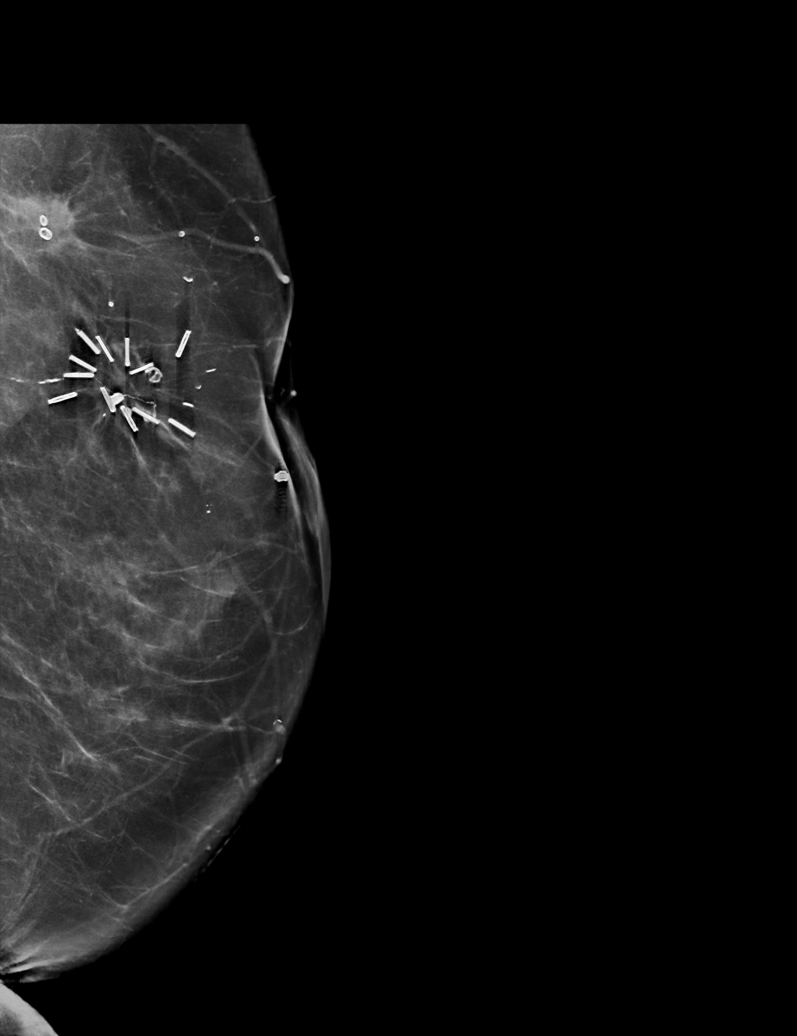

[L MLO synth-2D]
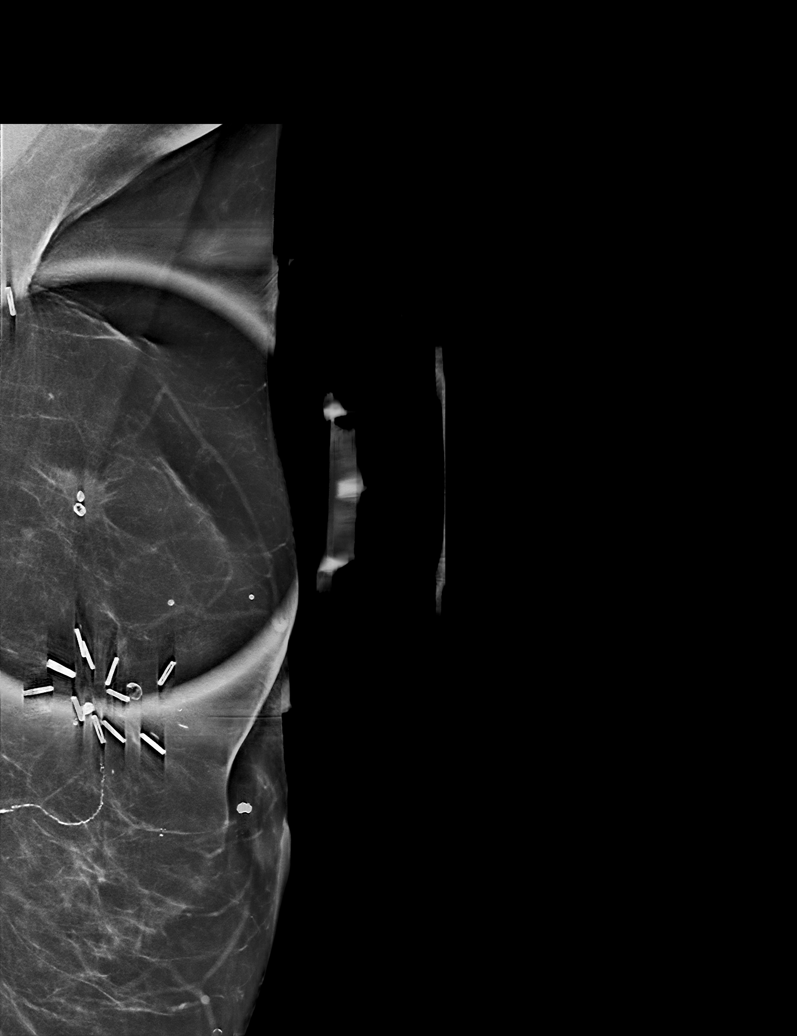

[L MLO tomo · tomo slice 38/75.0]
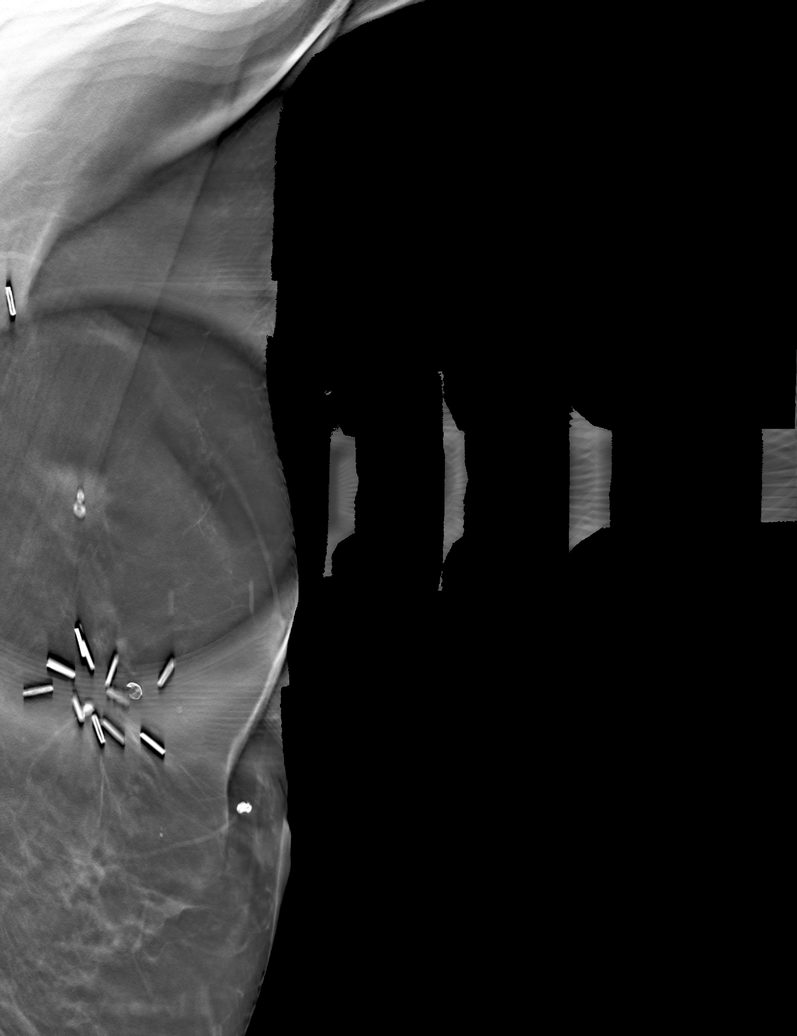

[L LM tomo · tomo slice 38/75.0]
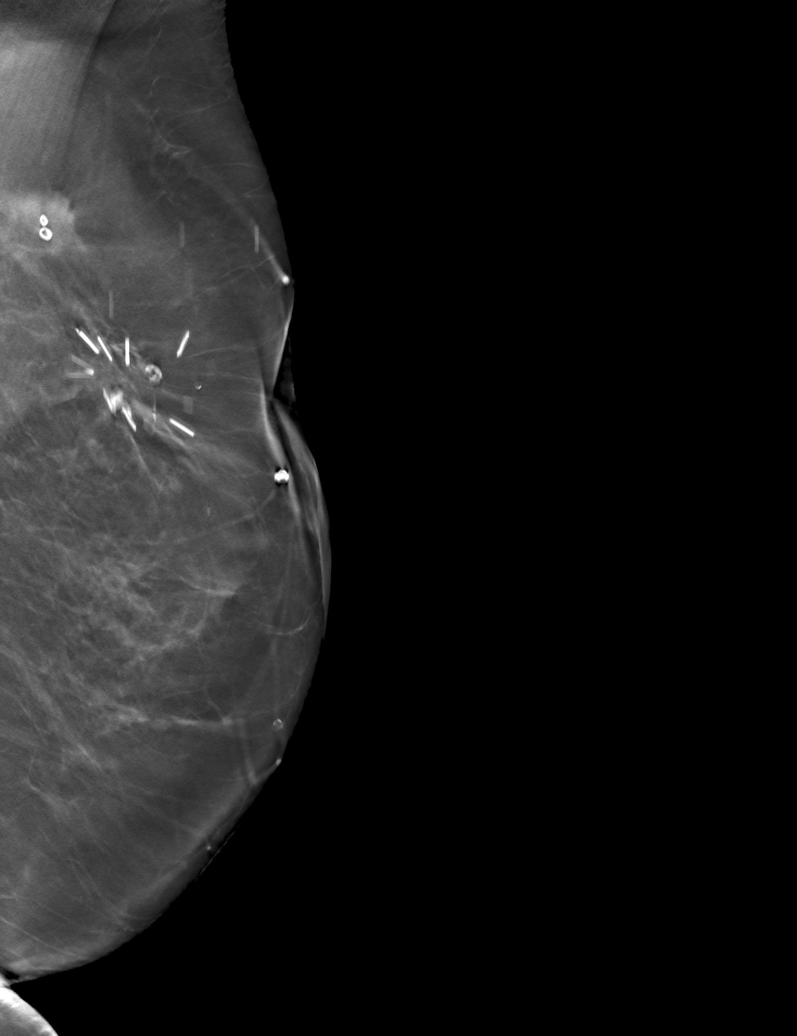

[6 of 14 positions shown; findings below may reference images not displayed]

ACR Breast Density Category b: There are scattered areas of
fibroglandular density.
FINDINGS: There is a spiculated mass in the upper-outer quadrant of the left
breast, superior to the lumpectomy sites spanning approximately
cm.

Mammographic images were processed with CAD.

Physical exam of the upper-outer quadrant of the left breast
demonstrates a firm fixed mass at approximately [DATE].

Ultrasound targeted to the palpable area of concern demonstrates a
hypoechoic irregular mass at [DATE], 7 cm from the nipple measuring
1.3 x 0.9 x 1.4 cm. Internal blood flow is documented on color
Doppler imaging. Ultrasound of the left axilla demonstrates 1
normal-appearing lymph node.
IMPRESSION: 1. There is a highly suspicious 1.4 cm mass in the left breast at
[DATE].

2.  No evidence of left axillary lymphadenopathy.

RECOMMENDATION:
Ultrasound-guided biopsy is recommended for the left breast mass,
and has been scheduled for 12/24/2015 at 3 p.m..

I have discussed the findings and recommendations with the patient.
Results were also provided in writing at the conclusion of the
visit. If applicable, a reminder letter will be sent to the patient
regarding the next appointment.

BI-RADS CATEGORY  5: Highly suggestive of malignancy.

## 2017-10-29 ENCOUNTER — Other Ambulatory Visit (HOSPITAL_COMMUNITY): Payer: Self-pay | Admitting: *Deleted

## 2017-11-01 ENCOUNTER — Other Ambulatory Visit (HOSPITAL_COMMUNITY): Payer: Self-pay | Admitting: *Deleted

## 2017-11-01 MED ORDER — ALPRAZOLAM 0.5 MG PO TABS: 0.5000 mg | ORAL_TABLET | Freq: Two times a day (BID) | ORAL | 1 refills | Status: DC | PRN

## 2017-11-03 ENCOUNTER — Other Ambulatory Visit (HOSPITAL_COMMUNITY): Payer: Self-pay | Admitting: *Deleted

## 2017-11-03 MED ORDER — ALPRAZOLAM 0.5 MG PO TABS: 0.5000 mg | ORAL_TABLET | Freq: Two times a day (BID) | ORAL | 1 refills | Status: AC | PRN

## 2017-11-15 ENCOUNTER — Encounter (HOSPITAL_COMMUNITY): Payer: BLUE CROSS/BLUE SHIELD

## 2017-11-15 DIAGNOSIS — Z1389 Encounter for screening for other disorder: Secondary | ICD-10-CM | POA: Diagnosis not present

## 2017-11-15 DIAGNOSIS — Z6827 Body mass index (BMI) 27.0-27.9, adult: Secondary | ICD-10-CM | POA: Diagnosis not present

## 2017-11-15 DIAGNOSIS — M5410 Radiculopathy, site unspecified: Secondary | ICD-10-CM | POA: Diagnosis not present

## 2017-11-15 DIAGNOSIS — M545 Low back pain: Secondary | ICD-10-CM | POA: Diagnosis not present

## 2017-11-15 DIAGNOSIS — E663 Overweight: Secondary | ICD-10-CM | POA: Diagnosis not present

## 2017-12-06 ENCOUNTER — Other Ambulatory Visit (HOSPITAL_COMMUNITY): Payer: Self-pay | Admitting: *Deleted

## 2017-12-20 ENCOUNTER — Ambulatory Visit
Admission: RE | Admit: 2017-12-20 | Discharge: 2017-12-20 | Disposition: A | Discharge status: Home / Self Care | Payer: BLUE CROSS/BLUE SHIELD | Source: Ambulatory Visit | Attending: Adult Health | Admitting: Adult Health

## 2017-12-20 DIAGNOSIS — Z1231 Encounter for screening mammogram for malignant neoplasm of breast: Secondary | ICD-10-CM | POA: Diagnosis not present

## 2017-12-30 ENCOUNTER — Encounter (HOSPITAL_COMMUNITY): Payer: Self-pay | Admitting: Internal Medicine

## 2017-12-30 ENCOUNTER — Inpatient Hospital Stay (HOSPITAL_COMMUNITY): Payer: BLUE CROSS/BLUE SHIELD | Attending: Hematology | Admitting: Internal Medicine

## 2017-12-30 VITALS — BP 141/74 | HR 87 | Temp 98.5°F | Resp 14 | Wt 170.1 lb

## 2017-12-30 DIAGNOSIS — Z9012 Acquired absence of left breast and nipple: Secondary | ICD-10-CM

## 2017-12-30 DIAGNOSIS — Z79899 Other long term (current) drug therapy: Secondary | ICD-10-CM | POA: Insufficient documentation

## 2017-12-30 DIAGNOSIS — Z9221 Personal history of antineoplastic chemotherapy: Secondary | ICD-10-CM | POA: Diagnosis not present

## 2017-12-30 DIAGNOSIS — Z171 Estrogen receptor negative status [ER-]: Secondary | ICD-10-CM | POA: Diagnosis not present

## 2017-12-30 DIAGNOSIS — Z923 Personal history of irradiation: Secondary | ICD-10-CM | POA: Insufficient documentation

## 2017-12-30 DIAGNOSIS — F418 Other specified anxiety disorders: Secondary | ICD-10-CM

## 2017-12-30 DIAGNOSIS — R252 Cramp and spasm: Secondary | ICD-10-CM

## 2017-12-30 DIAGNOSIS — Z803 Family history of malignant neoplasm of breast: Secondary | ICD-10-CM | POA: Diagnosis not present

## 2017-12-30 DIAGNOSIS — Z800 Family history of malignant neoplasm of digestive organs: Secondary | ICD-10-CM | POA: Diagnosis not present

## 2017-12-30 DIAGNOSIS — C50412 Malignant neoplasm of upper-outer quadrant of left female breast: Secondary | ICD-10-CM

## 2017-12-30 DIAGNOSIS — K219 Gastro-esophageal reflux disease without esophagitis: Secondary | ICD-10-CM

## 2017-12-30 DIAGNOSIS — G629 Polyneuropathy, unspecified: Secondary | ICD-10-CM | POA: Insufficient documentation

## 2017-12-30 DIAGNOSIS — M797 Fibromyalgia: Secondary | ICD-10-CM | POA: Diagnosis not present

## 2017-12-30 DIAGNOSIS — M1990 Unspecified osteoarthritis, unspecified site: Secondary | ICD-10-CM

## 2017-12-30 NOTE — Progress Notes (Signed)
Diagnosis Malignant neoplasm of upper-outer quadrant of left breast in female, estrogen receptor negative ([place]) - Plan: CBC with Differential/Platelet, Comprehensive metabolic panel, Lactate dehydrogenase  Staging Cancer Staging Breast cancer of upper-outer quadrant of left female breast [facility]) Staging form: Breast, AJCC 7th Edition - Pathologic stage from 01/08/2016: Stage IA (T1c, N0, cM0) - Signed by Baird Cancer, PA-C on 01/22/2016   Assessment and Plan:  1.  Stage IA left breast invasive ductal carcinoma, ER-/PR-/HER11+.  62 year old female s/p mastectomy and Taxotere/Carbo/Herceptin x 6 cycles; maintenance Herceptin every 3 weeks to total 1 year of therapy, which she completed on 01/01/17. She did not require adjuvant radiation therapy. Pt was no recommended for adjuvant anti-estrogen therapy given ER-.  -Last ECHO 11/18/16 normal with EF 55-60%.   Right breast screening mammogram done 12/20/2017 reviewed and was negative.  She is recommended for right screening mammogram in 12/2018.    Pt will RTC in 06/2018 for follow-up and labs.  She is advised to notify the office if any problems prior to that visit.    2.  Joint pain.   Pt has history of fibromyalgia.  Labs done 07/16/2017 showed normal alk phos.  She was again given option of Bone scan for further evaluation if ongoing symptoms.   3.  Neuropathy/Cramps.  .  She was given option for Cymbalta.  She reports magnesium supplements work for cramps.  She has option of magnesium or MVI.  Electrolytes on labs done in 07/2017 were WNL.    4  Port-a-cath.  Pt had port removed.   30 minutes spent with more than 50% spent in counseling and coordination of care.      Current Status:  Pt is seen today for follow-up.  She is reporting occasional joint pain and cramps.      Breast cancer of upper-outer quadrant of left female breast ([place])   11/07/1999 Surgery    Lumpectomy/sentinel LN biopsy invasive ductal carcinoma and DCIS at [facility], 0/6 sentinel LN    11/10/1999 - 01/08/2005 Anti-estrogen oral therapy    Tamoxifen 20 mg daily    11/24/1999 - 01/02/2000 Radiation Therapy       12/18/2015 Mammogram    2D digital screening bilateral mammogram with CAD and adjunct TOMO, in the L breast a possible mass warrants further evaluation. In R breast no findings suspicious for malignancy    12/24/2015 Pathology Results    Invasive ductal carcinoma Grade 3 Left core needle biopsy 1:30 o clock ER- PR- Ki67 70%, HER 2 positive    12/24/2015 Imaging    L breast Ultrasound, notes palpable area of concern demonstrates a hypoechoic irregular mass at 1:30, 7 cm from the nipple measuring 1.3 x 0.9 x 1.4 cm. No evidence of L axillary LAD    01/07/2016 Procedure    Left simple mastectomy by Dr. Georgette Dover    01/08/2016 Pathology Results    Breast, simple mastectomy, Left - INVASIVE DUCTAL CARCINOMA, GRADE III/III, SPANNING 1.4 CM. - THE SURGICAL RESECTION MARGINS ARE NEGATIVE FOR CARCINOMA.    01/23/2016 Genetic Testing    Patient refused genetic testing at time of consultation with Roma Kayser.  "Despite our recommendation, Ms. Zimmerle did not wish to pursue genetic testing at today's visit."    01/28/2016 Echocardiogram    Left ventricle: The cavity size was normal. Wall thickness was normal. Systolic function was normal. The estimated ejection fraction was in the range of 55% to 60%. Wall motion was normal; there were no  regional wall motion abnormalities. Doppler parameters are consistent with abnormal left ventricular relaxation (grade 1 diastolic dysfunction).    01/31/2016 - 05/15/2016 Chemotherapy    The patient had palonosetron (ALOXI) injection 0.25 mg, 0.25 mg, Intravenous,  Once, 1 of 6 cycles  pegfilgrastim (NEULASTA ONPRO KIT) injection 6 mg, 6 mg, Subcutaneous, Once, 1 of 6 cycles  CARBOplatin (PARAPLATIN) 560 mg in sodium chloride 0.9 % 250 mL chemo infusion, 560 mg (100 % of original dose 558.5 mg), Intravenous,   Once, 1 of 6 cycles Dose modification:   (original dose 558.5 mg, Cycle 1),   (original dose 558.5 mg, Cycle 2)  DOCEtaxel (TAXOTERE) 140 mg in dextrose 5 % 250 mL chemo infusion, 75 mg/m2 = 140 mg, Intravenous,  Once, 1 of 6 cycles  for chemotherapy treatment.      01/31/2016 - 01/01/2017 Antibody Plan    Herceptin x 52 weeks    08/24/2016 Echocardiogram    Left ventricle: The cavity size was normal. Wall thickness was normal. Systolic function was normal. The estimated ejection fraction was in the range of 55% to 60%. Wall motion was normal; there were no regional wall motion abnormalities. Doppler parameters are consistent with abnormal left ventricular relaxation (grade 1 diastolic dysfunction).    11/18/2016 Echocardiogram    LV EF: 55% -  60%. - Left ventricle: The cavity size was normal. Wall thickness was   normal. Systolic function was normal. The estimated ejection   fraction was in the range of 55% to 60%. Wall motion was normal;   there were no regional wall motion abnormalities. Left   ventricular diastolic function parameters were normal. - Mitral valve: There was mild regurgitation. - Tricuspid valve: There was mild regurgitation.      Problem List Patient Active Problem List   Diagnosis Date Noted  . Constipation [K59.00] 02/18/2017  . Heme + stool [R19.5] 02/18/2017  . Family history of breast cancer [Z80.3]   . Family history of colon cancer [Z80.0]   . Breast cancer of upper-outer quadrant of left female breast ([place]) [C50.412] 01/07/2016  . Sprain of foot, left [S93.602A] 01/15/2015  . Sprain of foot [S93.609A] 01/15/2015  . Sprained ankle [S93.409A] 01/15/2015  . Mild mitral insufficiency [I34.0] 06/13/2013  . Palpitations [R00.2] 06/13/2013  . IMPINGEMENT SYNDROME [M75.80] 07/03/2009    Past Medical History Past Medical History:  Diagnosis Date  . Anxiety   . Arthritis    knees  . Bulging of cervical intervertebral disc   . Dental  crowns present   . Depression   . Family history of breast cancer   . Family history of colon cancer   . Fibromyalgia   . GERD (gastroesophageal reflux disease)   . History of chemotherapy   . History of left breast cancer 01/2016  . Multiple thyroid nodules   . Palpitations   . PONV (postoperative nausea and vomiting)     Past Surgical History Past Surgical History:  Procedure Laterality Date  . ABDOMINAL HYSTERECTOMY  04/2017   complete  . COLONOSCOPY N/A 03/09/2017   Procedure: COLONOSCOPY;  Surgeon: Danie Binder, MD;  Location: [facility];  Service: Endoscopy;  Laterality: N/A;  2:45pm  . LAPAROSCOPIC APPENDECTOMY N/A 11/10/2012   Procedure: APPENDECTOMY LAPAROSCOPIC;  Surgeon: Jamesetta So, MD;  Location: [facility] ORS;  Service: General;  Laterality: N/A;  . MASTECTOMY W/ SENTINEL NODE BIOPSY Left 01/07/2016   Procedure: LEFT TOTAL MASTECTOMY WITH LEFT AXILLARY SENTINEL LYMPH NODE BIOPSY;  Surgeon: Donnie Mesa, MD;  Location: [facility];  Service: General;  Laterality: Left;  . PORT-A-CATH REMOVAL N/A 09/07/2017   Procedure: REMOVAL PORT-A-CATH;  Surgeon: Donnie Mesa, MD;  Location: [place];  Service: General;  Laterality: N/A;  . PORTACATH PLACEMENT Right 01/07/2016   Procedure: INSERTION PORT-A-CATH RIGHT INTERNAL JUGULAR WITH ULTRASOUND;  Surgeon: Donnie Mesa, MD;  Location: [place];  Service: General;  Laterality: Right;    Family History Family History  Problem Relation Age of Onset  . Colon cancer Mother 53       deceased secondary to metastatic cancer  . Diabetes Father   . Diabetes Sister   . Alcohol abuse Brother   . Cancer Brother 62       lung cancer  . Cancer Brother        penile  . Breast cancer Maternal Aunt   . Cancer Other        unknown form of cancer     Social History  reports that she has never smoked. She has never used smokeless tobacco. She reports that she does not drink alcohol or use drugs.  Medications  Current  Outpatient Medications:  .  ALPRAZolam (XANAX) 0.5 MG tablet, Take 1 tablet (0.5 mg total) by mouth 2 (two) times daily as needed for anxiety., Disp: 60 tablet, Rfl: 1 .  Cholecalciferol (VITAMIN D3) 2000 units TABS, Take 2,000 Units by mouth every morning. , Disp: , Rfl:  .  famotidine (PEPCID) 20 MG tablet, Take 20 mg by mouth daily., Disp: , Rfl:  .  metoprolol tartrate (LOPRESSOR) 25 MG tablet, Take 1 tablet (25 mg total) by mouth 2 (two) times daily. 25 mg in the morning. 12.5 mg in the evening,, Disp: 60 tablet, Rfl: 0 No current facility-administered medications for this visit.   Facility-Administered Medications Ordered in Other Visits:  .  sodium chloride flush (NS) 0.9 % injection 10 mL, 10 mL, Intracatheter, PRN, [place], Kelby Fam, MD  Allergies Codeine; Latex; and Naprosyn [naproxen]  Review of Systems Review of Systems - Oncology ROS negative other than neuropathy and muscle cramps.     Physical Exam  Vitals Wt Readings from Last 3 Encounters:  12/30/17 170 lb 1.6 oz (77.2 kg)  09/07/17 165 lb (74.8 kg)  07/16/17 166 lb 12.8 oz (75.7 kg)   Temp Readings from Last 3 Encounters:  12/30/17 98.5 F (36.9 C) (Oral)  09/07/17 97.9 F (36.6 C) (Oral)  07/16/17 98.5 F (36.9 C) (Oral)   BP Readings from Last 3 Encounters:  12/30/17 (!) 141/74  09/07/17 126/72  07/16/17 (!) 131/57   Pulse Readings from Last 3 Encounters:  12/30/17 87  09/07/17 69  07/16/17 80   Constitutional: Well-developed, well-nourished, and in no distress.   HENT: Head: Normocephalic and atraumatic.  Mouth/Throat: No oropharyngeal exudate. Mucosa moist. Eyes: Pupils are equal, round, and reactive to light. Conjunctivae are normal. No scleral icterus.  Neck: Normal range of motion. Neck supple. No JVD present.  Cardiovascular: Normal rate, regular rhythm and normal heart sounds.  Exam reveals no gallop and no friction rub.   No murmur heard. Pulmonary/Chest: Effort normal and breath sounds  normal. No respiratory distress. No wheezes.No rales.  Abdominal: Soft. Bowel sounds are normal. No distension. There is no tenderness. There is no guarding.  Musculoskeletal: No edema or tenderness.  Lymphadenopathy: No cervical, axillaryor supraclavicular adenopathy.  Neurological: Alert and oriented to person, place, and time. No cranial nerve deficit.  Skin: Skin is warm and dry. No rash noted.  No erythema. No pallor.  Psychiatric: Affect and judgment normal.  Breast exam:  Chaperone present.  Left mastectomy site shows no palpable nodules.  Right breast shows no palpable dominant masses.    Labs No visits with results within 3 Day(s) from this visit.  Latest known visit with results is:  Infusion on 07/16/2017  Component Date Value Ref Range Status  . LDH 07/16/2017 132  98 - 192 U/L Final   Performed at [facility], [address], [place], [place] [address]  . Sodium 07/16/2017 141  135 - 145 mmol/L Final  . Potassium 07/16/2017 3.9  3.5 - 5.1 mmol/L Final  . Chloride 07/16/2017 104  101 - 111 mmol/L Final  . CO2 07/16/2017 25  22 - 32 mmol/L Final  . Glucose, Bld 07/16/2017 78  65 - 99 mg/dL Final  . BUN 07/16/2017 14  6 - 20 mg/dL Final  . Creatinine, Ser 07/16/2017 0.66  0.44 - 1.00 mg/dL Final  . Calcium 07/16/2017 9.8  8.9 - 10.3 mg/dL Final  . Total Protein 07/16/2017 7.5  6.5 - 8.1 g/dL Final  . Albumin 07/16/2017 4.1  3.5 - 5.0 g/dL Final  . AST 07/16/2017 27  15 - 41 U/L Final  . ALT 07/16/2017 22  14 - 54 U/L Final  . Alkaline Phosphatase 07/16/2017 78  38 - 126 U/L Final  . Total Bilirubin 07/16/2017 0.7  0.3 - 1.2 mg/dL Final  . GFR calc non Af Amer 07/16/2017 >60  >60 mL/min Final  . GFR calc Af Amer 07/16/2017 >60  >60 mL/min Final   Comment: (NOTE) The eGFR has been calculated using the CKD EPI equation. This calculation has not been validated in all clinical situations. eGFR's persistently <60 mL/min signify possible Chronic Kidney Disease.   Georgiann Hahn gap  07/16/2017 12  5 - 15 Final   Performed at [facility], [address] [place], [place] [address]  . WBC 07/16/2017 5.9  4.0 - 10.5 K/uL Final  . RBC 07/16/2017 4.55  3.87 - 5.11 MIL/uL Final  . Hemoglobin 07/16/2017 13.8  12.0 - 15.0 g/dL Final  . HCT 07/16/2017 44.3  36.0 - 46.0 % Final  . MCV 07/16/2017 97.4  78.0 - 100.0 fL Final  . MCH 07/16/2017 30.3  26.0 - 34.0 pg Final  . MCHC 07/16/2017 31.2  30.0 - 36.0 g/dL Final  . RDW 07/16/2017 12.5  11.5 - 15.5 % Final  . Platelets 07/16/2017 259  150 - 400 K/uL Final  . Neutrophils Relative % 07/16/2017 45  % Final  . Neutro Abs 07/16/2017 2.7  1.7 - 7.7 K/uL Final  . Lymphocytes Relative 07/16/2017 45  % Final  . Lymphs Abs 07/16/2017 2.6  0.7 - 4.0 K/uL Final  . Monocytes Relative 07/16/2017 7  % Final  . Monocytes Absolute 07/16/2017 0.4  0.1 - 1.0 K/uL Final  . Eosinophils Relative 07/16/2017 3  % Final  . Eosinophils Absolute 07/16/2017 0.2  0.0 - 0.7 K/uL Final  . Basophils Relative 07/16/2017 0  % Final  . Basophils Absolute 07/16/2017 0.0  0.0 - 0.1 K/uL Final   Performed at [facility], [address] [place], [place] [address]     Pathology Orders Placed This Encounter  Procedures  . CBC with Differential/Platelet    Standing Status:   Future    Standing Expiration Date:   12/31/2019  . Comprehensive metabolic panel    Standing Status:   Future    Standing Expiration  Date:   12/31/2019  . Lactate dehydrogenase    Standing Status:   Future    Standing Expiration Date:   12/31/2019       Zoila Shutter MD

## 2018-01-24 ENCOUNTER — Other Ambulatory Visit (HOSPITAL_COMMUNITY): Payer: Self-pay | Admitting: *Deleted

## 2018-01-31 ENCOUNTER — Telehealth (HOSPITAL_COMMUNITY): Payer: Self-pay | Admitting: *Deleted

## 2018-02-07 ENCOUNTER — Other Ambulatory Visit (HOSPITAL_COMMUNITY): Payer: Self-pay | Admitting: Nurse Practitioner

## 2018-02-07 DIAGNOSIS — C50919 Malignant neoplasm of unspecified site of unspecified female breast: Secondary | ICD-10-CM

## 2018-02-07 MED ORDER — OMEPRAZOLE 20 MG PO CPDR: 20.0000 mg | DELAYED_RELEASE_CAPSULE | Freq: Every day | ORAL | 0 refills | Status: DC

## 2018-02-16 DIAGNOSIS — Z01419 Encounter for gynecological examination (general) (routine) without abnormal findings: Secondary | ICD-10-CM | POA: Diagnosis not present

## 2018-02-16 DIAGNOSIS — Z6827 Body mass index (BMI) 27.0-27.9, adult: Secondary | ICD-10-CM | POA: Diagnosis not present

## 2018-03-03 DIAGNOSIS — M791 Myalgia, unspecified site: Secondary | ICD-10-CM | POA: Diagnosis not present

## 2018-03-03 DIAGNOSIS — Z1389 Encounter for screening for other disorder: Secondary | ICD-10-CM | POA: Diagnosis not present

## 2018-03-03 DIAGNOSIS — M797 Fibromyalgia: Secondary | ICD-10-CM | POA: Diagnosis not present

## 2018-03-03 DIAGNOSIS — H6503 Acute serous otitis media, bilateral: Secondary | ICD-10-CM | POA: Diagnosis not present

## 2018-03-03 DIAGNOSIS — R5383 Other fatigue: Secondary | ICD-10-CM | POA: Diagnosis not present

## 2018-03-16 ENCOUNTER — Telehealth: Payer: Self-pay

## 2018-03-16 NOTE — Telephone Encounter (Signed)
REVIEWED-NO ADDITIONAL RECOMMENDATIONS. 

## 2018-03-16 NOTE — Telephone Encounter (Signed)
I called pt to see what kind of problem she is having. She said she is not having any problem. She said they told Dr. Gala Romney that she never got her results from her colonoscopy by Dr. Oneida Alar. Her last colonoscopy was done 03/09/2017 and I called her on 03/18/2017 with results. She said she just didn't remember.  She is aware of results now and knows she is on Recall for 3 years. She denies any problems at this time.

## 2018-03-16 NOTE — Telephone Encounter (Signed)
pts husband was having a procedure done with [facility] this morning and this pt approached [facility] in endo and said she never got her results from her last procedure with [facility] and she is having some problems with reflux and would like to speak with [facility] nurse. Please call her.

## 2018-04-06 DIAGNOSIS — Z9012 Acquired absence of left breast and nipple: Secondary | ICD-10-CM | POA: Diagnosis not present

## 2018-04-06 DIAGNOSIS — C50912 Malignant neoplasm of unspecified site of left female breast: Secondary | ICD-10-CM | POA: Diagnosis not present

## 2018-05-19 NOTE — Telephone Encounter (Signed)
Taken care of

## 2018-06-21 ENCOUNTER — Inpatient Hospital Stay (HOSPITAL_COMMUNITY): Payer: BLUE CROSS/BLUE SHIELD | Attending: Hematology

## 2018-06-21 DIAGNOSIS — Z9221 Personal history of antineoplastic chemotherapy: Secondary | ICD-10-CM | POA: Insufficient documentation

## 2018-06-21 DIAGNOSIS — Z923 Personal history of irradiation: Secondary | ICD-10-CM | POA: Insufficient documentation

## 2018-06-21 DIAGNOSIS — Z171 Estrogen receptor negative status [ER-]: Secondary | ICD-10-CM | POA: Insufficient documentation

## 2018-06-21 DIAGNOSIS — C50412 Malignant neoplasm of upper-outer quadrant of left female breast: Secondary | ICD-10-CM | POA: Diagnosis not present

## 2018-06-21 DIAGNOSIS — Z9012 Acquired absence of left breast and nipple: Secondary | ICD-10-CM | POA: Insufficient documentation

## 2018-06-21 LAB — CBC WITH DIFFERENTIAL/PLATELET
Abs Immature Granulocytes: 0.02 10*3/uL (ref 0.00–0.07)
Basophils Absolute: 0.1 10*3/uL (ref 0.0–0.1)
Basophils Relative: 1 %
EOS ABS: 0.2 10*3/uL (ref 0.0–0.5)
EOS PCT: 3 %
HEMATOCRIT: 47.3 % — AB (ref 36.0–46.0)
HEMOGLOBIN: 15 g/dL (ref 12.0–15.0)
Immature Granulocytes: 0 %
LYMPHS ABS: 2.7 10*3/uL (ref 0.7–4.0)
LYMPHS PCT: 39 %
MCH: 30.6 pg (ref 26.0–34.0)
MCHC: 31.7 g/dL (ref 30.0–36.0)
MCV: 96.5 fL (ref 80.0–100.0)
MONO ABS: 0.4 10*3/uL (ref 0.1–1.0)
Monocytes Relative: 6 %
Neutro Abs: 3.6 10*3/uL (ref 1.7–7.7)
Neutrophils Relative %: 51 %
Platelets: 235 10*3/uL (ref 150–400)
RBC: 4.9 MIL/uL (ref 3.87–5.11)
RDW: 12.5 % (ref 11.5–15.5)
WBC: 7 10*3/uL (ref 4.0–10.5)
nRBC: 0 % (ref 0.0–0.2)

## 2018-06-21 LAB — COMPREHENSIVE METABOLIC PANEL
ALT: 24 U/L (ref 0–44)
ANION GAP: 8 (ref 5–15)
AST: 24 U/L (ref 15–41)
Albumin: 4.2 g/dL (ref 3.5–5.0)
Alkaline Phosphatase: 58 U/L (ref 38–126)
BILIRUBIN TOTAL: 0.5 mg/dL (ref 0.3–1.2)
BUN: 12 mg/dL (ref 8–23)
CO2: 25 mmol/L (ref 22–32)
Calcium: 9.5 mg/dL (ref 8.9–10.3)
Chloride: 106 mmol/L (ref 98–111)
Creatinine, Ser: 0.66 mg/dL (ref 0.44–1.00)
Glucose, Bld: 92 mg/dL (ref 70–99)
POTASSIUM: 4.5 mmol/L (ref 3.5–5.1)
Sodium: 139 mmol/L (ref 135–145)
TOTAL PROTEIN: 7.7 g/dL (ref 6.5–8.1)

## 2018-06-21 LAB — LACTATE DEHYDROGENASE: LDH: 149 U/L (ref 98–192)

## 2018-06-28 ENCOUNTER — Inpatient Hospital Stay (HOSPITAL_COMMUNITY): Payer: BLUE CROSS/BLUE SHIELD | Attending: Hematology | Admitting: Internal Medicine

## 2018-06-28 ENCOUNTER — Other Ambulatory Visit: Payer: Self-pay

## 2018-06-28 ENCOUNTER — Encounter (HOSPITAL_COMMUNITY): Payer: Self-pay | Admitting: Internal Medicine

## 2018-06-28 VITALS — BP 153/90 | HR 81 | Temp 98.3°F | Resp 20 | Wt 178.0 lb

## 2018-06-28 DIAGNOSIS — Z9223 Personal history of estrogen therapy: Secondary | ICD-10-CM | POA: Diagnosis not present

## 2018-06-28 DIAGNOSIS — Z800 Family history of malignant neoplasm of digestive organs: Secondary | ICD-10-CM | POA: Diagnosis not present

## 2018-06-28 DIAGNOSIS — Z79899 Other long term (current) drug therapy: Secondary | ICD-10-CM | POA: Diagnosis not present

## 2018-06-28 DIAGNOSIS — M797 Fibromyalgia: Secondary | ICD-10-CM | POA: Insufficient documentation

## 2018-06-28 DIAGNOSIS — R252 Cramp and spasm: Secondary | ICD-10-CM | POA: Diagnosis not present

## 2018-06-28 DIAGNOSIS — Z9221 Personal history of antineoplastic chemotherapy: Secondary | ICD-10-CM | POA: Diagnosis not present

## 2018-06-28 DIAGNOSIS — Z791 Long term (current) use of non-steroidal anti-inflammatories (NSAID): Secondary | ICD-10-CM

## 2018-06-28 DIAGNOSIS — Z803 Family history of malignant neoplasm of breast: Secondary | ICD-10-CM | POA: Diagnosis not present

## 2018-06-28 DIAGNOSIS — Z923 Personal history of irradiation: Secondary | ICD-10-CM | POA: Diagnosis not present

## 2018-06-28 DIAGNOSIS — F418 Other specified anxiety disorders: Secondary | ICD-10-CM | POA: Diagnosis not present

## 2018-06-28 DIAGNOSIS — C50412 Malignant neoplasm of upper-outer quadrant of left female breast: Secondary | ICD-10-CM

## 2018-06-28 DIAGNOSIS — Z171 Estrogen receptor negative status [ER-]: Secondary | ICD-10-CM | POA: Diagnosis not present

## 2018-06-28 DIAGNOSIS — Z86000 Personal history of in-situ neoplasm of breast: Secondary | ICD-10-CM | POA: Diagnosis not present

## 2018-06-28 DIAGNOSIS — C50919 Malignant neoplasm of unspecified site of unspecified female breast: Secondary | ICD-10-CM

## 2018-06-28 DIAGNOSIS — Z9012 Acquired absence of left breast and nipple: Secondary | ICD-10-CM

## 2018-06-28 DIAGNOSIS — L989 Disorder of the skin and subcutaneous tissue, unspecified: Secondary | ICD-10-CM | POA: Diagnosis not present

## 2018-06-28 DIAGNOSIS — M1990 Unspecified osteoarthritis, unspecified site: Secondary | ICD-10-CM | POA: Diagnosis not present

## 2018-06-28 DIAGNOSIS — G629 Polyneuropathy, unspecified: Secondary | ICD-10-CM | POA: Diagnosis not present

## 2018-06-28 NOTE — Progress Notes (Signed)
Diagnosis HER2-positive carcinoma of breast ([place]) - Plan: MM 3D SCREEN BREAST UNI RIGHT  Staging Cancer Staging Breast cancer of upper-outer quadrant of left female breast [facility]) Staging form: Breast, AJCC 7th Edition - Pathologic stage from 01/08/2016: Stage IA (T1c, N0, cM0) - Signed by Baird Cancer, PA-C on 01/22/2016   Assessment and Plan:  1.  Stage IA left breast invasive ductal carcinoma, ER-/PR-/HER26+.  63 year old female s/p mastectomy and Taxotere/Carbo/Herceptin x 6 cycles; maintenance Herceptin every 3 weeks to total 1 year of therapy, which she completed on 01/01/17. She did not require adjuvant radiation therapy. Pt was no recommended for adjuvant anti-estrogen therapy given ER-.  -Last ECHO 11/18/16 normal with EF 55-60%.   Right breast screening mammogram done 12/20/2017 was negative.   Labs done 06/21/2018 reviewed and showed WBC 7 HB 15 plts 235,000.  Chemistries WNL with K+ 4.5 Cr 0.66 normal LFTs and normal Alkaline phosphatase.  She is set up for right screening mammogram in 12/2018.  She will RTC in 12/2018 to go over results.   2.  Joint pain.   Pt has reported history of fibromyalgia.  Labs done 06/21/2018 showed normal alk phos.  She was again given option of Bone scan for further evaluation if ongoing symptoms. She does not desire imaging due to cost concerns with insurance.  I have discussed with her If symptoms worsen to consider imaging.    3.  Neuropathy/Cramps.  Previously, she was given option for Cymbalta.  She reports magnesium supplements work for cramps.  She will also try B vitamins.  Pt has option of Neurontin or Cymbalta if desired.  Electrolytes on labs done in 06/2018  were WNL with glucose of 92.    4.  Right thigh lesion.  Appears consistent with lipoma or cyst.  NT and < 0.5 cm.  Pt should notify the office if area changes.    5.  Health maintenance.  Follow-up with GI as directed.    25 minutes spent with more than 50% spent in counseling and  coordination of care.    Current Status:  Pt is seen today for follow-up.  She continues to reports joint pain.  She reports concerns about having imaging due to cost concerns.   She reports neuropathy at night.      Breast cancer of upper-outer quadrant of left female breast ([place])   11/07/1999 Surgery    Lumpectomy/sentinel LN biopsy invasive ductal carcinoma and DCIS at [facility], 0/6 sentinel LN    11/10/1999 - 01/08/2005 Anti-estrogen oral therapy    Tamoxifen 20 mg daily    11/24/1999 - 01/02/2000 Radiation Therapy       12/18/2015 Mammogram    2D digital screening bilateral mammogram with CAD and adjunct TOMO, in the L breast a possible mass warrants further evaluation. In R breast no findings suspicious for malignancy    12/24/2015 Pathology Results    Invasive ductal carcinoma Grade 3 Left core needle biopsy 1:30 o clock ER- PR- Ki67 70%, HER 2 positive    12/24/2015 Imaging    L breast Ultrasound, notes palpable area of concern demonstrates a hypoechoic irregular mass at 1:30, 7 cm from the nipple measuring 1.3 x 0.9 x 1.4 cm. No evidence of L axillary LAD    01/07/2016 Procedure    Left simple mastectomy by Dr. Georgette Dover    01/08/2016 Pathology Results    Breast, simple mastectomy, Left - INVASIVE DUCTAL CARCINOMA, GRADE III/III, SPANNING 1.4 CM. - THE SURGICAL RESECTION  MARGINS ARE NEGATIVE FOR CARCINOMA.    01/23/2016 Genetic Testing    Patient refused genetic testing at time of consultation with Roma Kayser.  "Despite our recommendation, Ms. Milbourn did not wish to pursue genetic testing at today's visit."    01/28/2016 Echocardiogram    Left ventricle: The cavity size was normal. Wall thickness was normal. Systolic function was normal. The estimated ejection fraction was in the range of 55% to 60%. Wall motion was normal; there were no regional wall motion abnormalities. Doppler parameters are consistent with abnormal left ventricular relaxation (grade 1 diastolic  dysfunction).    01/31/2016 - 05/15/2016 Chemotherapy    The patient had palonosetron (ALOXI) injection 0.25 mg, 0.25 mg, Intravenous,  Once, 1 of 6 cycles  pegfilgrastim (NEULASTA ONPRO KIT) injection 6 mg, 6 mg, Subcutaneous, Once, 1 of 6 cycles  CARBOplatin (PARAPLATIN) 560 mg in sodium chloride 0.9 % 250 mL chemo infusion, 560 mg (100 % of original dose 558.5 mg), Intravenous,  Once, 1 of 6 cycles Dose modification:   (original dose 558.5 mg, Cycle 1),   (original dose 558.5 mg, Cycle 2)  DOCEtaxel (TAXOTERE) 140 mg in dextrose 5 % 250 mL chemo infusion, 75 mg/m2 = 140 mg, Intravenous,  Once, 1 of 6 cycles  for chemotherapy treatment.      01/31/2016 - 01/01/2017 Antibody Plan    Herceptin x 52 weeks    08/24/2016 Echocardiogram    Left ventricle: The cavity size was normal. Wall thickness was normal. Systolic function was normal. The estimated ejection fraction was in the range of 55% to 60%. Wall motion was normal; there were no regional wall motion abnormalities. Doppler parameters are consistent with abnormal left ventricular relaxation (grade 1 diastolic dysfunction).    11/18/2016 Echocardiogram    LV EF: 55% -  60%. - Left ventricle: The cavity size was normal. Wall thickness was   normal. Systolic function was normal. The estimated ejection   fraction was in the range of 55% to 60%. Wall motion was normal;   there were no regional wall motion abnormalities. Left   ventricular diastolic function parameters were normal. - Mitral valve: There was mild regurgitation. - Tricuspid valve: There was mild regurgitation.      Problem List Patient Active Problem List   Diagnosis Date Noted  . Constipation [K59.00] 02/18/2017  . Heme + stool [R19.5] 02/18/2017  . Family history of breast cancer [Z80.3]   . Family history of colon cancer [Z80.0]   . Breast cancer of upper-outer quadrant of left female breast ([place]) [C50.412] 01/07/2016  . Sprain of foot, left  [S93.602A] 01/15/2015  . Sprain of foot [S93.609A] 01/15/2015  . Sprained ankle [S93.409A] 01/15/2015  . Mild mitral insufficiency [I34.0] 06/13/2013  . Palpitations [R00.2] 06/13/2013  . IMPINGEMENT SYNDROME [M75.80] 07/03/2009    Past Medical History Past Medical History:  Diagnosis Date  . Anxiety   . Arthritis    knees  . Bulging of cervical intervertebral disc   . Dental crowns present   . Depression   . Family history of breast cancer   . Family history of colon cancer   . Fibromyalgia   . GERD (gastroesophageal reflux disease)   . History of chemotherapy   . History of left breast cancer 01/2016  . Multiple thyroid nodules   . Palpitations   . PONV (postoperative nausea and vomiting)     Past Surgical History Past Surgical History:  Procedure Laterality Date  . ABDOMINAL HYSTERECTOMY  04/2017  complete  . COLONOSCOPY N/A 03/09/2017   Procedure: COLONOSCOPY;  Surgeon: Danie Binder, MD;  [facility];  Service: Endoscopy;  Laterality: N/A;  2:45pm  . LAPAROSCOPIC APPENDECTOMY N/A 11/10/2012   Procedure: APPENDECTOMY LAPAROSCOPIC;  Surgeon: Jamesetta So, MD;  Location: [facility] ORS;  Service: General;  Laterality: N/A;  . MASTECTOMY W/ SENTINEL NODE BIOPSY Left 01/07/2016   Procedure: LEFT TOTAL MASTECTOMY WITH LEFT AXILLARY SENTINEL LYMPH NODE BIOPSY;  Surgeon: Donnie Mesa, MD;  Location: [place];  Service: General;  Laterality: Left;  . PORT-A-CATH REMOVAL N/A 09/07/2017   Procedure: REMOVAL PORT-A-CATH;  Surgeon: Donnie Mesa, MD;  Location: [place];  Service: General;  Laterality: N/A;  . PORTACATH PLACEMENT Right 01/07/2016   Procedure: INSERTION PORT-A-CATH RIGHT INTERNAL JUGULAR WITH ULTRASOUND;  Surgeon: Donnie Mesa, MD;  Location: [place];  Service: General;  Laterality: Right;    Family History Family History  Problem Relation Age of Onset  . Colon cancer Mother 34       deceased secondary to metastatic cancer  . Diabetes Father    . Diabetes Sister   . Alcohol abuse Brother   . Cancer Brother 18       lung cancer  . Cancer Brother        penile  . Breast cancer Maternal Aunt   . Cancer Other        unknown form of cancer     Social History  reports that she has never smoked. She has never used smokeless tobacco. She reports that she does not drink alcohol or use drugs.  Medications  Current Outpatient Medications:  .  ALPRAZolam (XANAX) 0.5 MG tablet, Take 1 tablet (0.5 mg total) by mouth 2 (two) times daily as needed for anxiety., Disp: 60 tablet, Rfl: 1 .  Cholecalciferol (VITAMIN D3) 2000 units TABS, Take 2,000 Units by mouth every morning. , Disp: , Rfl:  .  ibuprofen (ADVIL,MOTRIN) 800 MG tablet, Take 800 mg by mouth daily., Disp: , Rfl:  .  metoprolol tartrate (LOPRESSOR) 25 MG tablet, Take 1 tablet (25 mg total) by mouth 2 (two) times daily. 25 mg in the morning. 12.5 mg in the evening,, Disp: 60 tablet, Rfl: 0 .  omeprazole (PRILOSEC) 20 MG capsule, Take 1 capsule (20 mg total) by mouth daily., Disp: 30 capsule, Rfl: 0 No current facility-administered medications for this visit.   Facility-Administered Medications Ordered in Other Visits:  .  sodium chloride flush (NS) 0.9 % injection 10 mL, 10 mL, Intracatheter, PRN, [place], Kelby Fam, MD  Allergies Codeine; Latex; and Naprosyn [naproxen]  Review of Systems Review of Systems - Oncology ROS negative   Physical Exam  Vitals Wt Readings from Last 3 Encounters:  06/28/18 178 lb (80.7 kg)  12/30/17 170 lb 1.6 oz (77.2 kg)  09/07/17 165 lb (74.8 kg)   Temp Readings from Last 3 Encounters:  06/28/18 98.3 F (36.8 C) (Oral)  12/30/17 98.5 F (36.9 C) (Oral)  09/07/17 97.9 F (36.6 C) (Oral)   BP Readings from Last 3 Encounters:  06/28/18 (!) 153/90  12/30/17 (!) 141/74  09/07/17 126/72   Pulse Readings from Last 3 Encounters:  06/28/18 81  12/30/17 87  09/07/17 69   Constitutional: Well-developed, well-nourished, and in no  distress.   HENT: Head: Normocephalic and atraumatic.  Mouth/Throat: No oropharyngeal exudate. Mucosa moist. Eyes: Pupils are equal, round, and reactive to light. Conjunctivae are normal. No scleral icterus.  Neck: Normal range of motion. Neck  supple. No JVD present.  Cardiovascular: Normal rate, regular rhythm and normal heart sounds.  Exam reveals no gallop and no friction rub.   No murmur heard. Pulmonary/Chest: Effort normal and breath sounds normal. No respiratory distress. No wheezes.No rales.  Abdominal: Soft. Bowel sounds are normal. No distension. There is no tenderness. There is no guarding.  Musculoskeletal: No edema or tenderness.  Lymphadenopathy: No cervical, axillary or supraclavicular adenopathy.  Neurological: Alert and oriented to person, place, and time. No cranial nerve deficit.  Skin: Skin is warm and dry. No rash noted. No erythema. No pallor.  Right thigh lesion appears consistent with lipoma or cyst < 0.5 cm Psychiatric: Affect and judgment normal.  Breast exam:  Chaperone present.  Left mastectomy site healed well.  Right breast shows no palpable dominant masses.    Labs No visits with results within 3 Day(s) from this visit.  Latest known visit with results is:  Appointment on 06/21/2018  Component Date Value Ref Range Status  . WBC 06/21/2018 7.0  4.0 - 10.5 K/uL Final  . RBC 06/21/2018 4.90  3.87 - 5.11 MIL/uL Final  . Hemoglobin 06/21/2018 15.0  12.0 - 15.0 g/dL Final  . HCT 06/21/2018 47.3* 36.0 - 46.0 % Final  . MCV 06/21/2018 96.5  80.0 - 100.0 fL Final  . MCH 06/21/2018 30.6  26.0 - 34.0 pg Final  . MCHC 06/21/2018 31.7  30.0 - 36.0 g/dL Final  . RDW 06/21/2018 12.5  11.5 - 15.5 % Final  . Platelets 06/21/2018 235  150 - 400 K/uL Final  . nRBC 06/21/2018 0.0  0.0 - 0.2 % Final  . Neutrophils Relative % 06/21/2018 51  % Final  . Neutro Abs 06/21/2018 3.6  1.7 - 7.7 K/uL Final  . Lymphocytes Relative 06/21/2018 39  % Final  . Lymphs Abs 06/21/2018 2.7   0.7 - 4.0 K/uL Final  . Monocytes Relative 06/21/2018 6  % Final  . Monocytes Absolute 06/21/2018 0.4  0.1 - 1.0 K/uL Final  . Eosinophils Relative 06/21/2018 3  % Final  . Eosinophils Absolute 06/21/2018 0.2  0.0 - 0.5 K/uL Final  . Basophils Relative 06/21/2018 1  % Final  . Basophils Absolute 06/21/2018 0.1  0.0 - 0.1 K/uL Final  . Immature Granulocytes 06/21/2018 0  % Final  . Abs Immature Granulocytes 06/21/2018 0.02  0.00 - 0.07 K/uL Final   Performed at [facility], [address] [place], [place] [address]  . Sodium 06/21/2018 139  135 - 145 mmol/L Final  . Potassium 06/21/2018 4.5  3.5 - 5.1 mmol/L Final  . Chloride 06/21/2018 106  98 - 111 mmol/L Final  . CO2 06/21/2018 25  22 - 32 mmol/L Final  . Glucose, Bld 06/21/2018 92  70 - 99 mg/dL Final  . BUN 06/21/2018 12  8 - 23 mg/dL Final  . Creatinine, Ser 06/21/2018 0.66  0.44 - 1.00 mg/dL Final  . Calcium 06/21/2018 9.5  8.9 - 10.3 mg/dL Final  . Total Protein 06/21/2018 7.7  6.5 - 8.1 g/dL Final  . Albumin 06/21/2018 4.2  3.5 - 5.0 g/dL Final  . AST 06/21/2018 24  15 - 41 U/L Final  . ALT 06/21/2018 24  0 - 44 U/L Final  . Alkaline Phosphatase 06/21/2018 58  38 - 126 U/L Final  . Total Bilirubin 06/21/2018 0.5  0.3 - 1.2 mg/dL Final  . GFR calc non Af Amer 06/21/2018 >60  >60 mL/min Final  . GFR calc Af Amer 06/21/2018 >  60  >60 mL/min Final  . Anion gap 06/21/2018 8  5 - 15 Final   Performed at [facility], [address], [place], [place] [address]  . LDH 06/21/2018 149  98 - 192 U/L Final   Performed at [facility], [address], [place], [place] [address]     Pathology Orders Placed This Encounter  Procedures  . MM 3D SCREEN BREAST UNI RIGHT    bcbs Pf: 12/20/17'@bcg' /  No needs/  No implants/  Yes hx of br ca- fj spoke with office    Standing Status:   Future    Standing Expiration Date:   08/27/2019    Order Specific Question:   Reason for Exam (SYMPTOM  OR DIAGNOSIS REQUIRED)    Answer:   left breast cancer     Order Specific Question:   Preferred imaging location?    Answer:   [facility]       Mathis Dad Keilan Nichol MD

## 2018-06-28 NOTE — Patient Instructions (Signed)
[place] at [facility] Discharge Instructions  Today you saw Dr. Walden Field  Please take a B vitamin (over-the-counter) or a Multivitamin.  We will order a bone scan if you call and let us know that you are ready for that.    Thank you for choosing [place] at [facility] to provide your oncology and hematology care.  To afford each patient quality time with our provider, please arrive at least 15 minutes before your scheduled appointment time.   If you have a lab appointment with the [facility] please come in thru the  Main Entrance and check in at the main information desk  You need to re-schedule your appointment should you arrive 10 or more minutes late.  We strive to give you quality time with our providers, and arriving late affects you and other patients whose appointments are after yours.  Also, if you no show three or more times for appointments you may be dismissed from the clinic at the providers discretion.     Again, thank you for choosing [facility].  Our hope is that these requests will decrease the amount of time that you wait before being seen by our physicians.       _____________________________________________________________  Should you have questions after your visit to [facility], please contact our office at [phone] between the hours of 8:00 a.m. and 4:30 p.m.  Voicemails left after 4:00 p.m. will not be returned until the following business day.  For prescription refill requests, have your pharmacy contact our office and allow 72 hours.    Cancer Center Support Programs:   > Cancer Support Group  2nd Tuesday of the month 1pm-2pm, Journey Room

## 2018-10-27 DIAGNOSIS — F419 Anxiety disorder, unspecified: Secondary | ICD-10-CM | POA: Diagnosis not present

## 2018-10-27 DIAGNOSIS — I10 Essential (primary) hypertension: Secondary | ICD-10-CM | POA: Diagnosis not present

## 2018-12-22 ENCOUNTER — Other Ambulatory Visit: Payer: Self-pay

## 2018-12-22 ENCOUNTER — Ambulatory Visit
Admission: RE | Admit: 2018-12-22 | Discharge: 2018-12-22 | Disposition: A | Discharge status: Home / Self Care | Payer: BC Managed Care – PPO | Source: Ambulatory Visit | Attending: Internal Medicine | Admitting: Internal Medicine

## 2018-12-22 DIAGNOSIS — Z1231 Encounter for screening mammogram for malignant neoplasm of breast: Secondary | ICD-10-CM | POA: Diagnosis not present

## 2018-12-22 DIAGNOSIS — C50919 Malignant neoplasm of unspecified site of unspecified female breast: Secondary | ICD-10-CM

## 2018-12-23 ENCOUNTER — Other Ambulatory Visit: Payer: Self-pay | Admitting: Internal Medicine

## 2018-12-23 DIAGNOSIS — R928 Other abnormal and inconclusive findings on diagnostic imaging of breast: Secondary | ICD-10-CM

## 2018-12-26 ENCOUNTER — Other Ambulatory Visit (HOSPITAL_COMMUNITY): Payer: Self-pay | Admitting: *Deleted

## 2018-12-26 DIAGNOSIS — C50919 Malignant neoplasm of unspecified site of unspecified female breast: Secondary | ICD-10-CM

## 2018-12-26 DIAGNOSIS — C50412 Malignant neoplasm of upper-outer quadrant of left female breast: Secondary | ICD-10-CM

## 2018-12-27 ENCOUNTER — Other Ambulatory Visit: Payer: Self-pay | Admitting: Family Medicine

## 2018-12-27 ENCOUNTER — Other Ambulatory Visit (HOSPITAL_COMMUNITY): Payer: Self-pay | Admitting: *Deleted

## 2018-12-27 ENCOUNTER — Inpatient Hospital Stay (HOSPITAL_COMMUNITY): Payer: BC Managed Care – PPO | Attending: Hematology

## 2018-12-27 ENCOUNTER — Other Ambulatory Visit: Payer: Self-pay | Admitting: Nurse Practitioner

## 2018-12-27 ENCOUNTER — Other Ambulatory Visit: Payer: Self-pay

## 2018-12-27 DIAGNOSIS — C50919 Malignant neoplasm of unspecified site of unspecified female breast: Secondary | ICD-10-CM

## 2018-12-27 DIAGNOSIS — C50412 Malignant neoplasm of upper-outer quadrant of left female breast: Secondary | ICD-10-CM | POA: Diagnosis not present

## 2018-12-27 DIAGNOSIS — Z171 Estrogen receptor negative status [ER-]: Secondary | ICD-10-CM | POA: Diagnosis not present

## 2018-12-27 DIAGNOSIS — Z86000 Personal history of in-situ neoplasm of breast: Secondary | ICD-10-CM | POA: Diagnosis not present

## 2018-12-27 DIAGNOSIS — Z923 Personal history of irradiation: Secondary | ICD-10-CM | POA: Diagnosis not present

## 2018-12-27 DIAGNOSIS — Z9012 Acquired absence of left breast and nipple: Secondary | ICD-10-CM | POA: Diagnosis not present

## 2018-12-27 DIAGNOSIS — Z9223 Personal history of estrogen therapy: Secondary | ICD-10-CM | POA: Insufficient documentation

## 2018-12-27 DIAGNOSIS — Z9221 Personal history of antineoplastic chemotherapy: Secondary | ICD-10-CM | POA: Diagnosis not present

## 2018-12-27 LAB — COMPREHENSIVE METABOLIC PANEL
ALT: 32 U/L (ref 0–44)
AST: 30 U/L (ref 15–41)
Albumin: 4.3 g/dL (ref 3.5–5.0)
Alkaline Phosphatase: 69 U/L (ref 38–126)
Anion gap: 7 (ref 5–15)
BUN: 10 mg/dL (ref 8–23)
CO2: 25 mmol/L (ref 22–32)
Calcium: 9.4 mg/dL (ref 8.9–10.3)
Chloride: 104 mmol/L (ref 98–111)
Creatinine, Ser: 0.63 mg/dL (ref 0.44–1.00)
GFR calc Af Amer: >60 mL/min (ref >60)
GFR calc non Af Amer: >60 mL/min (ref >60)
Glucose, Bld: 93 mg/dL (ref 70–99)
Potassium: 4 mmol/L (ref 3.5–5.1)
Sodium: 136 mmol/L (ref 135–145)
Total Bilirubin: 0.5 mg/dL (ref 0.3–1.2)
Total Protein: 7.7 g/dL (ref 6.5–8.1)

## 2018-12-27 LAB — CBC WITH DIFFERENTIAL/PLATELET
Abs Immature Granulocytes: 0.01 10*3/uL (ref 0.00–0.07)
Basophils Absolute: 0 10*3/uL (ref 0.0–0.1)
Basophils Relative: 1 %
Eosinophils Absolute: 0.2 10*3/uL (ref 0.0–0.5)
Eosinophils Relative: 3 %
HCT: 45.4 % (ref 36.0–46.0)
Hemoglobin: 14.8 g/dL (ref 12.0–15.0)
Immature Granulocytes: 0 %
Lymphocytes Relative: 44 %
Lymphs Abs: 2.4 10*3/uL (ref 0.7–4.0)
MCH: 31.3 pg (ref 26.0–34.0)
MCHC: 32.6 g/dL (ref 30.0–36.0)
MCV: 96 fL (ref 80.0–100.0)
Monocytes Absolute: 0.4 10*3/uL (ref 0.1–1.0)
Monocytes Relative: 8 %
Neutro Abs: 2.5 10*3/uL (ref 1.7–7.7)
Neutrophils Relative %: 44 %
Platelets: 282 10*3/uL (ref 150–400)
RBC: 4.73 MIL/uL (ref 3.87–5.11)
RDW: 12.5 % (ref 11.5–15.5)
WBC: 5.5 10*3/uL (ref 4.0–10.5)
nRBC: 0 % (ref 0.0–0.2)

## 2018-12-27 LAB — LACTATE DEHYDROGENASE: LDH: 134 U/L (ref 98–192)

## 2018-12-28 ENCOUNTER — Ambulatory Visit: Payer: BC Managed Care – PPO

## 2018-12-28 ENCOUNTER — Ambulatory Visit
Admission: RE | Admit: 2018-12-28 | Discharge: 2018-12-28 | Disposition: A | Discharge status: Home / Self Care | Payer: BC Managed Care – PPO | Source: Ambulatory Visit | Attending: Internal Medicine | Admitting: Internal Medicine

## 2018-12-28 ENCOUNTER — Other Ambulatory Visit: Payer: Self-pay

## 2018-12-28 ENCOUNTER — Other Ambulatory Visit: Payer: Self-pay | Admitting: Family Medicine

## 2018-12-28 DIAGNOSIS — R928 Other abnormal and inconclusive findings on diagnostic imaging of breast: Secondary | ICD-10-CM

## 2019-01-03 ENCOUNTER — Inpatient Hospital Stay (HOSPITAL_COMMUNITY): Payer: BC Managed Care – PPO | Attending: Nurse Practitioner | Admitting: Nurse Practitioner

## 2019-01-03 ENCOUNTER — Other Ambulatory Visit: Payer: Self-pay

## 2019-01-03 DIAGNOSIS — Z9012 Acquired absence of left breast and nipple: Secondary | ICD-10-CM | POA: Diagnosis not present

## 2019-01-03 DIAGNOSIS — Z171 Estrogen receptor negative status [ER-]: Secondary | ICD-10-CM | POA: Insufficient documentation

## 2019-01-03 DIAGNOSIS — G620 Drug-induced polyneuropathy: Secondary | ICD-10-CM | POA: Insufficient documentation

## 2019-01-03 DIAGNOSIS — Z791 Long term (current) use of non-steroidal anti-inflammatories (NSAID): Secondary | ICD-10-CM | POA: Insufficient documentation

## 2019-01-03 DIAGNOSIS — Z9221 Personal history of antineoplastic chemotherapy: Secondary | ICD-10-CM | POA: Diagnosis not present

## 2019-01-03 DIAGNOSIS — M797 Fibromyalgia: Secondary | ICD-10-CM | POA: Insufficient documentation

## 2019-01-03 DIAGNOSIS — T451X5S Adverse effect of antineoplastic and immunosuppressive drugs, sequela: Secondary | ICD-10-CM | POA: Diagnosis not present

## 2019-01-03 DIAGNOSIS — R5383 Other fatigue: Secondary | ICD-10-CM | POA: Diagnosis not present

## 2019-01-03 DIAGNOSIS — C50412 Malignant neoplasm of upper-outer quadrant of left female breast: Secondary | ICD-10-CM

## 2019-01-03 DIAGNOSIS — Z79899 Other long term (current) drug therapy: Secondary | ICD-10-CM | POA: Insufficient documentation

## 2019-01-03 DIAGNOSIS — Z923 Personal history of irradiation: Secondary | ICD-10-CM | POA: Insufficient documentation

## 2019-01-03 DIAGNOSIS — M1990 Unspecified osteoarthritis, unspecified site: Secondary | ICD-10-CM | POA: Diagnosis not present

## 2019-01-03 DIAGNOSIS — Z9223 Personal history of estrogen therapy: Secondary | ICD-10-CM | POA: Diagnosis not present

## 2019-01-03 DIAGNOSIS — Z853 Personal history of malignant neoplasm of breast: Secondary | ICD-10-CM | POA: Diagnosis not present

## 2019-01-03 DIAGNOSIS — F419 Anxiety disorder, unspecified: Secondary | ICD-10-CM | POA: Insufficient documentation

## 2019-01-03 DIAGNOSIS — Z170 Estrogen receptor positive status [ER+]: Secondary | ICD-10-CM | POA: Diagnosis not present

## 2019-01-03 NOTE — Assessment & Plan Note (Signed)
1.  Stage Ia left breast invasive ductal carcinoma: - Patient was diagnosed with invasive ductal carcinoma, ER negative/PR negative/HER-2 positive. -Patient is status post mastectomy and Taxotere\carbo\Herceptin x6 cycles; maintenance Herceptin every 3 weeks for 1 year, she completed that on 01/01/2017. -She did not require adjuvant radiation therapy.  Patient was not recommended for adjuvant antiestrogen therapy given ER negative. -Last echo on 11/18/2016 was normal with a EF of 55 to 60%. - Last mammogram done on 12/28/2018 was BI-RADS Category 1 negative. -Labs done on 12/27/2018 showed potassium 4.0, creatinine 0.63, alkaline phosphatase 69, LDH 134, WBC 5.5, hemoglobin 14.8, platelets 282. -She will follow-up in 6 months with repeat labs.  2.  Joint pain: -Patient has reported history of fibromyalgia. - Patient was given the option for a bone scan however she denied she wanted it at this time.  3.  Neuropathy/cramps: - She reports the magnesium supplements are working for her cramps. -Patient was offered Neurontin or Cymbalta for her neuropathy but wishes to hold off at this time.

## 2019-01-03 NOTE — Progress Notes (Signed)
[place], [place] [address]   CLINIC:  Medical Oncology/Hematology  PCP:  Sharilyn Sites, Erda [address] [place] [address] [phone]   REASON FOR VISIT: Follow-up for breast cancer  CURRENT THERAPY: Surveillance per NCCN guidelines  BRIEF ONCOLOGIC HISTORY:  Oncology History  Breast cancer of upper-outer quadrant of left female breast ([place])  11/07/1999 Surgery   Lumpectomy/sentinel LN biopsy invasive ductal carcinoma and DCIS at [facility], 0/6 sentinel LN   11/10/1999 - 01/08/2005 Anti-estrogen oral therapy   Tamoxifen 20 mg daily   11/24/1999 - 01/02/2000 Radiation Therapy     12/18/2015 Mammogram   2D digital screening bilateral mammogram with CAD and adjunct TOMO, in the L breast a possible mass warrants further evaluation. In R breast no findings suspicious for malignancy   12/24/2015 Pathology Results   Invasive ductal carcinoma Grade 3 Left core needle biopsy 1:30 o clock ER- PR- Ki67 70%, HER 2 positive   12/24/2015 Imaging   L breast Ultrasound, notes palpable area of concern demonstrates a hypoechoic irregular mass at 1:30, 7 cm from the nipple measuring 1.3 x 0.9 x 1.4 cm. No evidence of L axillary LAD   01/07/2016 Procedure   Left simple mastectomy by Dr. Georgette Dover   01/08/2016 Pathology Results   Breast, simple mastectomy, Left - INVASIVE DUCTAL CARCINOMA, GRADE III/III, SPANNING 1.4 CM. - THE SURGICAL RESECTION MARGINS ARE NEGATIVE FOR CARCINOMA.   01/23/2016 Genetic Testing   Patient refused genetic testing at time of consultation with Roma Kayser.  "Despite our recommendation, Ms. Dickerson did not wish to pursue genetic testing at today's visit."   01/28/2016 Echocardiogram   Left ventricle: The cavity size was normal. Wall thickness was normal. Systolic function was normal. The estimated ejection fraction was in the range of 55% to 60%. Wall motion was normal; there were no regional wall motion  abnormalities. Doppler parameters are consistent with abnormal left ventricular relaxation (grade 1 diastolic dysfunction).   01/31/2016 - 05/15/2016 Chemotherapy   The patient had palonosetron (ALOXI) injection 0.25 mg, 0.25 mg, Intravenous,  Once, 1 of 6 cycles  pegfilgrastim (NEULASTA ONPRO KIT) injection 6 mg, 6 mg, Subcutaneous, Once, 1 of 6 cycles  CARBOplatin (PARAPLATIN) 560 mg in sodium chloride 0.9 % 250 mL chemo infusion, 560 mg (100 % of original dose 558.5 mg), Intravenous,  Once, 1 of 6 cycles Dose modification:   (original dose 558.5 mg, Cycle 1),   (original dose 558.5 mg, Cycle 2)  DOCEtaxel (TAXOTERE) 140 mg in dextrose 5 % 250 mL chemo infusion, 75 mg/m2 = 140 mg, Intravenous,  Once, 1 of 6 cycles  for chemotherapy treatment.     01/31/2016 - 01/01/2017 Antibody Plan   Herceptin x 52 weeks   08/24/2016 Echocardiogram   Left ventricle: The cavity size was normal. Wall thickness was normal. Systolic function was normal. The estimated ejection fraction was in the range of 55% to 60%. Wall motion was normal; there were no regional wall motion abnormalities. Doppler parameters are consistent with abnormal left ventricular relaxation (grade 1 diastolic dysfunction).   11/18/2016 Echocardiogram   LV EF: 55% -  60%. - Left ventricle: The cavity size was normal. Wall thickness was   normal. Systolic function was normal. The estimated ejection   fraction was in the range of 55% to 60%. Wall motion was normal;   there were no regional wall motion abnormalities. Left   ventricular diastolic function parameters were normal. - Mitral valve: There was  mild regurgitation. - Tricuspid valve: There was mild regurgitation.      CANCER STAGING: Cancer Staging Breast cancer of upper-outer quadrant of left female breast [facility]) Staging form: Breast, AJCC 7th Edition - Pathologic stage from 01/08/2016: Stage IA (T1c, N0, cM0) - Signed by Baird Cancer, PA-C on  01/22/2016    INTERVAL HISTORY:  Ms. Bowler 63 y.o. female returns for routine follow-up for breast cancer.  Patient reports she has been feeling well since her last visit.  She denies any new lumps bumps or problems.  She does have neuropathy in her feet from the chemotherapy. Denies any nausea, vomiting, or diarrhea. Denies any new pains. Had not noticed any recent bleeding such as epistaxis, hematuria or hematochezia. Denies recent chest pain on exertion, shortness of breath on minimal exertion, pre-syncopal episodes, or palpitations. Denies any numbness or tingling in hands or feet. Denies any recent fevers, infections, or recent hospitalizations. Patient reports appetite at 100% and energy level at 50%.  She is eating well maintain her weight as time.   REVIEW OF SYSTEMS:  Review of Systems  Constitutional: Positive for fatigue.  Neurological: Positive for numbness.  All other systems reviewed and are negative.    PAST MEDICAL/SURGICAL HISTORY:  Past Medical History:  Diagnosis Date  . Anxiety   . Arthritis    knees  . Bulging of cervical intervertebral disc   . Dental crowns present   . Depression   . Family history of breast cancer   . Family history of colon cancer   . Fibromyalgia   . GERD (gastroesophageal reflux disease)   . History of chemotherapy   . History of left breast cancer 01/2016  . Multiple thyroid nodules   . Palpitations   . PONV (postoperative nausea and vomiting)    Past Surgical History:  Procedure Laterality Date  . ABDOMINAL HYSTERECTOMY  04/2017   complete  . COLONOSCOPY N/A 03/09/2017   Procedure: COLONOSCOPY;  Surgeon: Danie Binder, MD;  [facility];  Service: Endoscopy;  Laterality: N/A;  2:45pm  . LAPAROSCOPIC APPENDECTOMY N/A 11/10/2012   Procedure: APPENDECTOMY LAPAROSCOPIC;  Surgeon: Jamesetta So, MD;  Location: [facility] ORS;  Service: General;  Laterality: N/A;  . MASTECTOMY Left 01/07/2016   breast cancer  . MASTECTOMY W/  SENTINEL NODE BIOPSY Left 01/07/2016   Procedure: LEFT TOTAL MASTECTOMY WITH LEFT AXILLARY SENTINEL LYMPH NODE BIOPSY;  Surgeon: Donnie Mesa, MD;  Location: [facility];  Service: General;  Laterality: Left;  . PORT-A-CATH REMOVAL N/A 09/07/2017   Procedure: REMOVAL PORT-A-CATH;  Surgeon: Donnie Mesa, MD;  Location: [facility];  Service: General;  Laterality: N/A;  . PORTACATH PLACEMENT Right 01/07/2016   Procedure: INSERTION PORT-A-CATH RIGHT INTERNAL JUGULAR WITH ULTRASOUND;  Surgeon: Donnie Mesa, MD;  Location: [facility];  Service: General;  Laterality: Right;     SOCIAL HISTORY:  Social History   Socioeconomic History  . Marital status: Married    Spouse name: Not on file  . Number of children: 2  . Years of education: Not on file  . Highest education level: Not on file  Occupational History  . Not on file  Social Needs  . Financial resource strain: Not on file  . Food insecurity    Worry: Not on file    Inability: Not on file  . Transportation needs    Medical: Not on file    Non-medical: Not on file  Tobacco Use  . Smoking status: Never Smoker  .  Smokeless tobacco: Never Used  Substance and Sexual Activity  . Alcohol use: No  . Drug use: No  . Sexual activity: Yes  Lifestyle  . Physical activity    Days per week: Not on file    Minutes per session: Not on file  . Stress: Not on file  Relationships  . Social Herbalist on phone: Not on file    Gets together: Not on file    Attends religious service: Not on file    Active member of club or organization: Not on file    Attends meetings of clubs or organizations: Not on file    Relationship status: Not on file  . Intimate partner violence    Fear of current or ex partner: Not on file    Emotionally abused: Not on file    Physically abused: Not on file    Forced sexual activity: Not on file  Other Topics Concern  . Not on file  Social History Narrative  . Not on file    FAMILY HISTORY:   Family History  Problem Relation Age of Onset  . Colon cancer Mother 24       deceased secondary to metastatic cancer  . Diabetes Father   . Diabetes Sister   . Alcohol abuse Brother   . Cancer Brother 54       lung cancer  . Cancer Brother        penile  . Breast cancer Maternal Aunt   . Cancer Other        unknown form of cancer    CURRENT MEDICATIONS:  Outpatient Encounter Medications as of 01/03/2019  Medication Sig Note  . ALPRAZolam (XANAX) 0.5 MG tablet Take 1 tablet (0.5 mg total) by mouth 2 (two) times daily as needed for anxiety. 06/28/2018: Usually just takes at night  . Cholecalciferol (VITAMIN D3) 2000 units TABS Take 2,000 Units by mouth every morning.    . metoprolol tartrate (LOPRESSOR) 25 MG tablet Take 1 tablet (25 mg total) by mouth 2 (two) times daily. 25 mg in the morning. 12.5 mg in the evening,   . omeprazole (PRILOSEC) 20 MG capsule Take 1 capsule (20 mg total) by mouth daily.   . fluticasone (FLONASE) 50 MCG/ACT nasal spray    . ibuprofen (ADVIL,MOTRIN) 800 MG tablet Take 800 mg by mouth as needed.    . [DISCONTINUED] prochlorperazine (COMPAZINE) 10 MG tablet Take 1 tablet (10 mg total) by mouth every 6 (six) hours as needed (Nausea or vomiting).    Facility-Administered Encounter Medications as of 01/03/2019  Medication  . sodium chloride flush (NS) 0.9 % injection 10 mL    ALLERGIES:  Allergies  Allergen Reactions  . Codeine Nausea And Vomiting  . Latex Itching and Rash  . Naprosyn [Naproxen] Itching and Rash     PHYSICAL EXAM:  ECOG Performance status: 1  Vitals:   01/03/19 1302  BP: 107/69  Pulse: 77  Resp: 18  Temp: (!) 97.3 F (36.3 C)  SpO2: 98%   Filed Weights   01/03/19 1302  Weight: 175 lb (79.4 kg)    Physical Exam Constitutional:      Appearance: Normal appearance. She is normal weight.  Cardiovascular:     Rate and Rhythm: Normal rate and regular rhythm.     Heart sounds: Normal heart sounds.  Pulmonary:     Effort:  Pulmonary effort is normal.     Breath sounds: Normal breath sounds.  Abdominal:  General: Bowel sounds are normal.     Palpations: Abdomen is soft.  Musculoskeletal: Normal range of motion.  Skin:    General: Skin is warm and dry.  Neurological:     Mental Status: She is alert and oriented to person, place, and time. Mental status is at baseline.  Psychiatric:        Mood and Affect: Mood normal.        Behavior: Behavior normal.        Thought Content: Thought content normal.        Judgment: Judgment normal.   Breast: LEFT: Mastectomy site well-healed no adenopathy or masses felt. RIGHT: No palpable masses, no skin changes or nipple discharge, no adenopathy.   LABORATORY DATA:  I have reviewed the labs as listed.  CBC    Component Value Date/Time   WBC 5.5 12/27/2018 1117   RBC 4.73 12/27/2018 1117   HGB 14.8 12/27/2018 1117   HCT 45.4 12/27/2018 1117   PLT 282 12/27/2018 1117   MCV 96.0 12/27/2018 1117   MCH 31.3 12/27/2018 1117   MCHC 32.6 12/27/2018 1117   RDW 12.5 12/27/2018 1117   LYMPHSABS 2.4 12/27/2018 1117   MONOABS 0.4 12/27/2018 1117   EOSABS 0.2 12/27/2018 1117   BASOSABS 0.0 12/27/2018 1117   CMP Latest Ref Rng & Units 12/27/2018 06/21/2018 07/16/2017  Glucose 70 - 99 mg/dL 93 92 78  BUN 8 - 23 mg/dL '10 12 14  ' Creatinine 0.44 - 1.00 mg/dL 0.63 0.66 0.66  Sodium 135 - 145 mmol/L 136 139 141  Potassium 3.5 - 5.1 mmol/L 4.0 4.5 3.9  Chloride 98 - 111 mmol/L 104 106 104  CO2 22 - 32 mmol/L '25 25 25  ' Calcium 8.9 - 10.3 mg/dL 9.4 9.5 9.8  Total Protein 6.5 - 8.1 g/dL 7.7 7.7 7.5  Total Bilirubin 0.3 - 1.2 mg/dL 0.5 0.5 0.7  Alkaline Phos 38 - 126 U/L 69 58 78  AST 15 - 41 U/L '30 24 27  ' ALT 0 - 44 U/L 32 24 22       DIAGNOSTIC IMAGING:  I have independently reviewed the mammogram scans and discussed with the patient.   I personally performed a face-to-face visit.  All questions were answered to patient's stated satisfaction. Encouraged patient  to call with any new concerns or questions before his next visit to the cancer center and we can certain see him sooner, if needed.     ASSESSMENT & PLAN:   Breast cancer of upper-outer quadrant of left female breast ([place]) 1.  Stage Ia left breast invasive ductal carcinoma: - Patient was diagnosed with invasive ductal carcinoma, ER negative/PR negative/HER-2 positive. -Patient is status post mastectomy and Taxotere\carbo\Herceptin x6 cycles; maintenance Herceptin every 3 weeks for 1 year, she completed that on 01/01/2017. -She did not require adjuvant radiation therapy.  Patient was not recommended for adjuvant antiestrogen therapy given ER negative. -Last echo on 11/18/2016 was normal with a EF of 55 to 60%. - Last mammogram done on 12/28/2018 was BI-RADS Category 1 negative. -Labs done on 12/27/2018 showed potassium 4.0, creatinine 0.63, alkaline phosphatase 69, LDH 134, WBC 5.5, hemoglobin 14.8, platelets 282. -She will follow-up in 6 months with repeat labs.  2.  Joint pain: -Patient has reported history of fibromyalgia. - Patient was given the option for a bone scan however she denied she wanted it at this time.  3.  Neuropathy/cramps: - She reports the magnesium supplements are working for her cramps. -Patient was offered  Neurontin or Cymbalta for her neuropathy but wishes to hold off at this time.      Orders placed this encounter:  Orders Placed This Encounter  Procedures  . Lactate dehydrogenase  . CBC with Differential/Platelet  . Comprehensive metabolic panel  . Vitamin B12  . VITAMIN D 25 Hydroxy (Vit-D Deficiency, Fractures)      Francene Finders, FNP-C [place] [phone]

## 2019-01-03 NOTE — Patient Instructions (Signed)
[facility] at [facility] Discharge Instructions  Follow up in 6 months with labs    Thank you for choosing [facility] at [facility] to provide your oncology and hematology care.  To afford each patient quality time with our provider, please arrive at least 15 minutes before your scheduled appointment time.   If you have a lab appointment with the Cancer Center please come in thru the Main Entrance and check in at the main information desk.  You need to re-schedule your appointment should you arrive 10 or more minutes late.  We strive to give you quality time with our providers, and arriving late affects you and other patients whose appointments are after yours.  Also, if you no show three or more times for appointments you may be dismissed from the clinic at the providers discretion.     Again, thank you for choosing [facility].  Our hope is that these requests will decrease the amount of time that you wait before being seen by our physicians.       _____________________________________________________________  Should you have questions after your visit to [facility], please contact our office at [phone] between the hours of 8:00 a.m. and 4:30 p.m.  Voicemails left after 4:00 p.m. will not be returned until the following business day.  For prescription refill requests, have your pharmacy contact our office and allow 72 hours.    Due to Covid, you will need to wear a mask upon entering the hospital. If you do not have a mask, a mask will be given to you at the Main Entrance upon arrival. For doctor visits, patients may have 1 support person with them. For treatment visits, patients can not have anyone with them due to social distancing guidelines and our immunocompromised population.      

## 2019-07-04 ENCOUNTER — Other Ambulatory Visit: Payer: Self-pay

## 2019-07-04 ENCOUNTER — Inpatient Hospital Stay (HOSPITAL_COMMUNITY): Payer: 59 | Attending: Hematology

## 2019-07-04 DIAGNOSIS — Z803 Family history of malignant neoplasm of breast: Secondary | ICD-10-CM | POA: Diagnosis not present

## 2019-07-04 DIAGNOSIS — Z79899 Other long term (current) drug therapy: Secondary | ICD-10-CM | POA: Diagnosis not present

## 2019-07-04 DIAGNOSIS — Z9221 Personal history of antineoplastic chemotherapy: Secondary | ICD-10-CM | POA: Insufficient documentation

## 2019-07-04 DIAGNOSIS — C50412 Malignant neoplasm of upper-outer quadrant of left female breast: Secondary | ICD-10-CM | POA: Insufficient documentation

## 2019-07-04 DIAGNOSIS — G629 Polyneuropathy, unspecified: Secondary | ICD-10-CM | POA: Diagnosis not present

## 2019-07-04 DIAGNOSIS — Z171 Estrogen receptor negative status [ER-]: Secondary | ICD-10-CM | POA: Insufficient documentation

## 2019-07-04 DIAGNOSIS — F418 Other specified anxiety disorders: Secondary | ICD-10-CM | POA: Diagnosis not present

## 2019-07-04 DIAGNOSIS — Z801 Family history of malignant neoplasm of trachea, bronchus and lung: Secondary | ICD-10-CM | POA: Diagnosis not present

## 2019-07-04 DIAGNOSIS — M797 Fibromyalgia: Secondary | ICD-10-CM | POA: Diagnosis not present

## 2019-07-04 DIAGNOSIS — Z9012 Acquired absence of left breast and nipple: Secondary | ICD-10-CM | POA: Diagnosis not present

## 2019-07-04 DIAGNOSIS — K219 Gastro-esophageal reflux disease without esophagitis: Secondary | ICD-10-CM | POA: Diagnosis not present

## 2019-07-04 DIAGNOSIS — R252 Cramp and spasm: Secondary | ICD-10-CM | POA: Insufficient documentation

## 2019-07-04 DIAGNOSIS — M1990 Unspecified osteoarthritis, unspecified site: Secondary | ICD-10-CM | POA: Diagnosis not present

## 2019-07-04 DIAGNOSIS — Z791 Long term (current) use of non-steroidal anti-inflammatories (NSAID): Secondary | ICD-10-CM | POA: Diagnosis not present

## 2019-07-04 LAB — CBC WITH DIFFERENTIAL/PLATELET
Abs Immature Granulocytes: 0.02 10*3/uL (ref 0.00–0.07)
Basophils Absolute: 0 10*3/uL (ref 0.0–0.1)
Basophils Relative: 1 %
Eosinophils Absolute: 0.2 10*3/uL (ref 0.0–0.5)
Eosinophils Relative: 3 %
HCT: 47.9 % — ABNORMAL HIGH (ref 36.0–46.0)
Hemoglobin: 15.5 g/dL — ABNORMAL HIGH (ref 12.0–15.0)
Immature Granulocytes: 0 %
Lymphocytes Relative: 43 %
Lymphs Abs: 3 10*3/uL (ref 0.7–4.0)
MCH: 32.1 pg (ref 26.0–34.0)
MCHC: 32.4 g/dL (ref 30.0–36.0)
MCV: 99.2 fL (ref 80.0–100.0)
Monocytes Absolute: 0.5 10*3/uL (ref 0.1–1.0)
Monocytes Relative: 7 %
Neutro Abs: 3.3 10*3/uL (ref 1.7–7.7)
Neutrophils Relative %: 46 %
Platelets: 296 10*3/uL (ref 150–400)
RBC: 4.83 MIL/uL (ref 3.87–5.11)
RDW: 12.5 % (ref 11.5–15.5)
WBC: 6.9 10*3/uL (ref 4.0–10.5)
nRBC: 0 % (ref 0.0–0.2)

## 2019-07-04 LAB — COMPREHENSIVE METABOLIC PANEL
ALT: 33 U/L (ref 0–44)
AST: 29 U/L (ref 15–41)
Albumin: 4.4 g/dL (ref 3.5–5.0)
Alkaline Phosphatase: 77 U/L (ref 38–126)
Anion gap: 9 (ref 5–15)
BUN: 16 mg/dL (ref 8–23)
CO2: 25 mmol/L (ref 22–32)
Calcium: 9.6 mg/dL (ref 8.9–10.3)
Chloride: 102 mmol/L (ref 98–111)
Creatinine, Ser: 0.72 mg/dL (ref 0.44–1.00)
GFR calc Af Amer: >60 mL/min (ref >60)
GFR calc non Af Amer: >60 mL/min (ref >60)
Glucose, Bld: 101 mg/dL — ABNORMAL HIGH (ref 70–99)
Potassium: 4.6 mmol/L (ref 3.5–5.1)
Sodium: 136 mmol/L (ref 135–145)
Total Bilirubin: 0.7 mg/dL (ref 0.3–1.2)
Total Protein: 8.1 g/dL (ref 6.5–8.1)

## 2019-07-04 LAB — VITAMIN D 25 HYDROXY (VIT D DEFICIENCY, FRACTURES): Vit D, 25-Hydroxy: 43.81 ng/mL (ref 30–100)

## 2019-07-04 LAB — VITAMIN B12: Vitamin B-12: 263 pg/mL (ref 180–914)

## 2019-07-04 LAB — LACTATE DEHYDROGENASE: LDH: 131 U/L (ref 98–192)

## 2019-07-11 ENCOUNTER — Other Ambulatory Visit: Payer: Self-pay

## 2019-07-11 ENCOUNTER — Encounter (HOSPITAL_COMMUNITY): Payer: Self-pay | Admitting: Nurse Practitioner

## 2019-07-11 ENCOUNTER — Inpatient Hospital Stay (HOSPITAL_BASED_OUTPATIENT_CLINIC_OR_DEPARTMENT_OTHER): Payer: 59 | Admitting: Nurse Practitioner

## 2019-07-11 VITALS — BP 141/78 | HR 79 | Temp 96.6°F | Resp 16 | Wt 177.2 lb

## 2019-07-11 DIAGNOSIS — C50412 Malignant neoplasm of upper-outer quadrant of left female breast: Secondary | ICD-10-CM | POA: Diagnosis not present

## 2019-07-11 DIAGNOSIS — Z1231 Encounter for screening mammogram for malignant neoplasm of breast: Secondary | ICD-10-CM | POA: Diagnosis not present

## 2019-07-11 DIAGNOSIS — Z171 Estrogen receptor negative status [ER-]: Secondary | ICD-10-CM

## 2019-07-11 NOTE — Assessment & Plan Note (Addendum)
1.  Stage Ia left breast invasive ductal carcinoma: - Patient was diagnosed with invasive ductal carcinoma, ER negative/PR negative/HER-2 positive. -Patient is status post mastectomy and Taxotere\carbo\Herceptin x6 cycles; maintenance Herceptin every 3 weeks for 1 year, she completed that on 01/01/2017. -She did not require adjuvant radiation therapy.  Patient was not recommended for adjuvant antiestrogen therapy given ER negative. -Last echo on 11/18/2016 was normal with a EF of 55 to 60%. - Last mammogram done on 12/28/2018 was BI-RADS Category 1 negative. -Labs done on 07/04/2019 showed potassium 4.6, creatinine 0.72, alkaline phosphatase 77, LDH 131, WBC 6.9, hemoglobin 15.5, platelets 296. -She will follow-up in 6 months with repeat labs and mammogram.  She will move to yearly appointments at her next visit.  2.  Joint pain: -Patient has reported history of fibromyalgia. - Patient was given the option for a bone scan however she denied she wanted it at this time.  3.  Neuropathy/cramps: - She reports the magnesium supplements are working for her cramps. -Patient was offered Neurontin or Cymbalta for her neuropathy but wishes to hold off at this time.

## 2019-07-11 NOTE — Patient Instructions (Signed)
[facility] at [facility] Discharge Instructions  Follow up in 6 months with labs and mammogram   Thank you for choosing [facility] at [facility] to provide your oncology and hematology care.  To afford each patient quality time with our provider, please arrive at least 15 minutes before your scheduled appointment time.   If you have a lab appointment with the Cancer Center please come in thru the Main Entrance and check in at the main information desk.  You need to re-schedule your appointment should you arrive 10 or more minutes late.  We strive to give you quality time with our providers, and arriving late affects you and other patients whose appointments are after yours.  Also, if you no show three or more times for appointments you may be dismissed from the clinic at the providers discretion.     Again, thank you for choosing [facility].  Our hope is that these requests will decrease the amount of time that you wait before being seen by our physicians.       _____________________________________________________________  Should you have questions after your visit to [facility], please contact our office at [phone] between the hours of 8:00 a.m. and 4:30 p.m.  Voicemails left after 4:00 p.m. will not be returned until the following business day.  For prescription refill requests, have your pharmacy contact our office and allow 72 hours.    Due to Covid, you will need to wear a mask upon entering the hospital. If you do not have a mask, a mask will be given to you at the Main Entrance upon arrival. For doctor visits, patients may have 1 support person with them. For treatment visits, patients can not have anyone with them due to social distancing guidelines and our immunocompromised population.      

## 2019-07-11 NOTE — Progress Notes (Signed)
[place], [place] [address]   CLINIC:  Medical Oncology/Hematology  PCP:  Sharilyn Sites, [place] [address] [place] [address] [phone]   REASON FOR VISIT: Follow-up for breast cancer  CURRENT THERAPY: Surveillance  BRIEF ONCOLOGIC HISTORY:  Oncology History  Breast cancer of upper-outer quadrant of left female breast ([place])  11/07/1999 Surgery   Lumpectomy/sentinel LN biopsy invasive ductal carcinoma and DCIS at [facility], 0/6 sentinel LN   11/10/1999 - 01/08/2005 Anti-estrogen oral therapy   Tamoxifen 20 mg daily   11/24/1999 - 01/02/2000 Radiation Therapy     12/18/2015 Mammogram   2D digital screening bilateral mammogram with CAD and adjunct TOMO, in the L breast a possible mass warrants further evaluation. In R breast no findings suspicious for malignancy   12/24/2015 Pathology Results   Invasive ductal carcinoma Grade 3 Left core needle biopsy 1:30 o clock ER- PR- Ki67 70%, HER 2 positive   12/24/2015 Imaging   L breast Ultrasound, notes palpable area of concern demonstrates a hypoechoic irregular mass at 1:30, 7 cm from the nipple measuring 1.3 x 0.9 x 1.4 cm. No evidence of L axillary LAD   01/07/2016 Procedure   Left simple mastectomy by Dr. Georgette Dover   01/08/2016 Pathology Results   Breast, simple mastectomy, Left - INVASIVE DUCTAL CARCINOMA, GRADE III/III, SPANNING 1.4 CM. - THE SURGICAL RESECTION MARGINS ARE NEGATIVE FOR CARCINOMA.   01/23/2016 Genetic Testing   Patient refused genetic testing at time of consultation with Roma Kayser.  "Despite our recommendation, [name] did not wish to pursue genetic testing at today's visit."   01/28/2016 Echocardiogram   Left ventricle: The cavity size was normal. Wall thickness was normal. Systolic function was normal. The estimated ejection fraction was in the range of 55% to 60%. Wall motion was normal; there were no regional wall motion abnormalities.  Doppler parameters are consistent with abnormal left ventricular relaxation (grade 1 diastolic dysfunction).   01/31/2016 - 05/15/2016 Chemotherapy   The patient had palonosetron (ALOXI) injection 0.25 mg, 0.25 mg, Intravenous,  Once, 1 of 6 cycles  pegfilgrastim (NEULASTA ONPRO KIT) injection 6 mg, 6 mg, Subcutaneous, Once, 1 of 6 cycles  CARBOplatin (PARAPLATIN) 560 mg in sodium chloride 0.9 % 250 mL chemo infusion, 560 mg (100 % of original dose 558.5 mg), Intravenous,  Once, 1 of 6 cycles Dose modification:   (original dose 558.5 mg, Cycle 1),   (original dose 558.5 mg, Cycle 2)  DOCEtaxel (TAXOTERE) 140 mg in dextrose 5 % 250 mL chemo infusion, 75 mg/m2 = 140 mg, Intravenous,  Once, 1 of 6 cycles  for chemotherapy treatment.     01/31/2016 - 01/01/2017 Antibody Plan   Herceptin x 52 weeks   08/24/2016 Echocardiogram   Left ventricle: The cavity size was normal. Wall thickness was normal. Systolic function was normal. The estimated ejection fraction was in the range of 55% to 60%. Wall motion was normal; there were no regional wall motion abnormalities. Doppler parameters are consistent with abnormal left ventricular relaxation (grade 1 diastolic dysfunction).   11/18/2016 Echocardiogram   LV EF: 55% -  60%. - Left ventricle: The cavity size was normal. Wall thickness was   normal. Systolic function was normal. The estimated ejection   fraction was in the range of 55% to 60%. Wall motion was normal;   there were no regional wall motion abnormalities. Left   ventricular diastolic function parameters were normal. - Mitral valve: There was mild regurgitation. -  Tricuspid valve: There was mild regurgitation.      CANCER STAGING: Cancer Staging Breast cancer of upper-outer quadrant of left female breast [facility]) Staging form: Breast, AJCC 7th Edition - Pathologic stage from 01/08/2016: Stage IA (T1c, N0, cM0) - Signed by Baird Cancer, PA-C on  01/22/2016    INTERVAL HISTORY:  [name] 64 y.o. female returns for routine follow-up for breast cancer.  Patient reports she is doing well since her last visit.  She denies any new lumps or bumps felt.  She denies any new severe fatigue.  She denies any new bone pain. Denies any nausea, vomiting, or diarrhea. Denies any new pains. Had not noticed any recent bleeding such as epistaxis, hematuria or hematochezia. Denies recent chest pain on exertion, shortness of breath on minimal exertion, pre-syncopal episodes, or palpitations. Denies any numbness or tingling in hands or feet. Denies any recent fevers, infections, or recent hospitalizations. Patient reports appetite at 100% and energy level at 25%.  She is eating well maintaining her weight at this time.     REVIEW OF SYSTEMS:  Review of Systems  Neurological: Positive for numbness.  All other systems reviewed and are negative.    PAST MEDICAL/SURGICAL HISTORY:  Past Medical History:  Diagnosis Date  . Anxiety   . Arthritis    knees  . Bulging of cervical intervertebral disc   . Dental crowns present   . Depression   . Family history of breast cancer   . Family history of colon cancer   . Fibromyalgia   . GERD (gastroesophageal reflux disease)   . History of chemotherapy   . History of left breast cancer 01/2016  . Multiple thyroid nodules   . Palpitations   . PONV (postoperative nausea and vomiting)    Past Surgical History:  Procedure Laterality Date  . ABDOMINAL HYSTERECTOMY  04/2017   complete  . COLONOSCOPY N/A 03/09/2017   Procedure: COLONOSCOPY;  Surgeon: Danie Binder, MD;  [facility];  Service: Endoscopy;  Laterality: N/A;  2:45pm  . LAPAROSCOPIC APPENDECTOMY N/A 11/10/2012   Procedure: APPENDECTOMY LAPAROSCOPIC;  Surgeon: Jamesetta So, MD;  Location: [facility];  Service: General;  Laterality: N/A;  . MASTECTOMY Left 01/07/2016   breast cancer  . MASTECTOMY W/ SENTINEL NODE BIOPSY Left 01/07/2016    Procedure: LEFT TOTAL MASTECTOMY WITH LEFT AXILLARY SENTINEL LYMPH NODE BIOPSY;  Surgeon: Donnie Mesa, MD;  Location: [place];  Service: General;  Laterality: Left;  . PORT-A-CATH REMOVAL N/A 09/07/2017   Procedure: REMOVAL PORT-A-CATH;  Surgeon: Donnie Mesa, MD;  Location: [place];  Service: General;  Laterality: N/A;  . PORTACATH PLACEMENT Right 01/07/2016   Procedure: INSERTION PORT-A-CATH RIGHT INTERNAL JUGULAR WITH ULTRASOUND;  Surgeon: Donnie Mesa, MD;  Location: [place];  Service: General;  Laterality: Right;     SOCIAL HISTORY:  Social History   Socioeconomic History  . Marital status: Married    Spouse name: Not on file  . Number of children: 2  . Years of education: Not on file  . Highest education level: Not on file  Occupational History  . Not on file  Tobacco Use  . Smoking status: Never Smoker  . Smokeless tobacco: Never Used  Substance and Sexual Activity  . Alcohol use: No  . Drug use: No  . Sexual activity: Yes  Other Topics Concern  . Not on file  Social History Narrative  . Not on file   Social Determinants of Health  Financial Resource Strain:   . Difficulty of Paying Living Expenses: Not on file  Food Insecurity:   . Worried About Charity fundraiser in the Last Year: Not on file  . Ran Out of Food in the Last Year: Not on file  Transportation Needs:   . Lack of Transportation (Medical): Not on file  . Lack of Transportation (Non-Medical): Not on file  Physical Activity:   . Days of Exercise per Week: Not on file  . Minutes of Exercise per Session: Not on file  Stress:   . Feeling of Stress : Not on file  Social Connections:   . Frequency of Communication with Friends and Family: Not on file  . Frequency of Social Gatherings with Friends and Family: Not on file  . Attends Religious Services: Not on file  . Active Member of Clubs or Organizations: Not on file  . Attends Archivist Meetings: Not on file  . Marital  Status: Not on file  Intimate Partner Violence:   . Fear of Current or Ex-Partner: Not on file  . Emotionally Abused: Not on file  . Physically Abused: Not on file  . Sexually Abused: Not on file    FAMILY HISTORY:  Family History  Problem Relation Age of Onset  . Colon cancer Mother 24       deceased secondary to metastatic cancer  . Diabetes Father   . Diabetes Sister   . Alcohol abuse Brother   . Cancer Brother 13       lung cancer  . Cancer Brother        penile  . Breast cancer Maternal Aunt   . Cancer Other        unknown form of cancer    CURRENT MEDICATIONS:  Outpatient Encounter Medications as of 07/11/2019  Medication Sig Note  . ALPRAZolam (XANAX) 0.5 MG tablet Take 1 tablet (0.5 mg total) by mouth 2 (two) times daily as needed for anxiety. 06/28/2018: Usually just takes at night  . Cholecalciferol (VITAMIN D3) 2000 units TABS Take 2,000 Units by mouth every morning.    Marland Kitchen ibuprofen (ADVIL,MOTRIN) 800 MG tablet Take 800 mg by mouth as needed.    . metoprolol tartrate (LOPRESSOR) 25 MG tablet Take 1 tablet (25 mg total) by mouth 2 (two) times daily. 25 mg in the morning. 12.5 mg in the evening,   . [DISCONTINUED] fluticasone (FLONASE) 50 MCG/ACT nasal spray    . [DISCONTINUED] omeprazole (PRILOSEC) 20 MG capsule Take 1 capsule (20 mg total) by mouth daily.   . [DISCONTINUED] prochlorperazine (COMPAZINE) 10 MG tablet Take 1 tablet (10 mg total) by mouth every 6 (six) hours as needed (Nausea or vomiting).    Facility-Administered Encounter Medications as of 07/11/2019  Medication  . sodium chloride flush (NS) 0.9 % injection 10 mL    ALLERGIES:  Allergies  Allergen Reactions  . Codeine Nausea And Vomiting  . Latex Itching and Rash  . Naprosyn [Naproxen] Itching and Rash     PHYSICAL EXAM:  ECOG Performance status: 1  Vitals:   07/11/19 1100  BP: (!) 141/78  Pulse: 79  Resp: 16  Temp: (!) 96.6 F (35.9 C)  SpO2: 99%   Filed Weights   07/11/19 1100   Weight: 177 lb 4 oz (80.4 kg)    Physical Exam Constitutional:      Appearance: Normal appearance. She is normal weight.  Cardiovascular:     Rate and Rhythm: Normal rate and regular rhythm.  Heart sounds: Normal heart sounds.  Pulmonary:     Effort: Pulmonary effort is normal.     Breath sounds: Normal breath sounds.  Abdominal:     General: Bowel sounds are normal.     Palpations: Abdomen is soft.  Musculoskeletal:        General: Normal range of motion.  Skin:    General: Skin is warm.  Neurological:     Mental Status: She is alert and oriented to person, place, and time. Mental status is at baseline.  Psychiatric:        Mood and Affect: Mood normal.        Behavior: Behavior normal.        Thought Content: Thought content normal.        Judgment: Judgment normal.   Breast: RIGHT: No palpable masses, no skin changes or nipple discharge, no adenopathy. LEFT: Mastectomy site well-healed.  No adenopathy or new masses felt.  LABORATORY DATA:  I have reviewed the labs as listed.  CBC    Component Value Date/Time   WBC 6.9 07/04/2019 1132   RBC 4.83 07/04/2019 1132   HGB 15.5 (H) 07/04/2019 1132   HCT 47.9 (H) 07/04/2019 1132   PLT 296 07/04/2019 1132   MCV 99.2 07/04/2019 1132   MCH 32.1 07/04/2019 1132   MCHC 32.4 07/04/2019 1132   RDW 12.5 07/04/2019 1132   LYMPHSABS 3.0 07/04/2019 1132   MONOABS 0.5 07/04/2019 1132   EOSABS 0.2 07/04/2019 1132   BASOSABS 0.0 07/04/2019 1132   CMP Latest Ref Rng & Units 07/04/2019 12/27/2018 06/21/2018  Glucose 70 - 99 mg/dL 101(H) 93 92  BUN 8 - 23 mg/dL '16 10 12  ' Creatinine 0.44 - 1.00 mg/dL 0.72 0.63 0.66  Sodium 135 - 145 mmol/L 136 136 139  Potassium 3.5 - 5.1 mmol/L 4.6 4.0 4.5  Chloride 98 - 111 mmol/L 102 104 106  CO2 22 - 32 mmol/L '25 25 25  ' Calcium 8.9 - 10.3 mg/dL 9.6 9.4 9.5  Total Protein 6.5 - 8.1 g/dL 8.1 7.7 7.7  Total Bilirubin 0.3 - 1.2 mg/dL 0.7 0.5 0.5  Alkaline Phos 38 - 126 U/L 77 69 58  AST 15 -  41 U/L '29 30 24  ' ALT 0 - 44 U/L 33 32 24     I personally performed a face-to-face visit.  All questions were answered to patient's stated satisfaction. Encouraged patient to call with any new concerns or questions before his next visit to the cancer center and we can certain see him sooner, if needed.     ASSESSMENT & PLAN:   Breast cancer of upper-outer quadrant of left female breast ([place]) 1.  Stage Ia left breast invasive ductal carcinoma: - Patient was diagnosed with invasive ductal carcinoma, ER negative/PR negative/HER-2 positive. -Patient is status post mastectomy and Taxotere\carbo\Herceptin x6 cycles; maintenance Herceptin every 3 weeks for 1 year, she completed that on 01/01/2017. -She did not require adjuvant radiation therapy.  Patient was not recommended for adjuvant antiestrogen therapy given ER negative. -Last echo on 11/18/2016 was normal with a EF of 55 to 60%. - Last mammogram done on 12/28/2018 was BI-RADS Category 1 negative. -Labs done on 07/04/2019 showed potassium 4.6, creatinine 0.72, alkaline phosphatase 77, LDH 131, WBC 6.9, hemoglobin 15.5, platelets 296. -She will follow-up in 6 months with repeat labs and mammogram.  She will move to yearly appointments at her next visit.  2.  Joint pain: -Patient has reported history of fibromyalgia. - Patient was  given the option for a bone scan however she denied she wanted it at this time.  3.  Neuropathy/cramps: - She reports the magnesium supplements are working for her cramps. -Patient was offered Neurontin or Cymbalta for her neuropathy but wishes to hold off at this time.      Orders placed this encounter:  Orders Placed This Encounter  Procedures  . MM 3D SCREEN BREAST UNI RIGHT  . Lactate dehydrogenase  . CBC with Differential/Platelet  . Comprehensive metabolic panel  . Ferritin  . Iron and TIBC  . Vitamin B12  . VITAMIN D 25 Hydroxy (Vit-D Deficiency, Fractures)      Francene Finders, FNP-C [place] [phone]

## 2019-11-10 DIAGNOSIS — Z6828 Body mass index (BMI) 28.0-28.9, adult: Secondary | ICD-10-CM | POA: Diagnosis not present

## 2019-11-10 DIAGNOSIS — E7849 Other hyperlipidemia: Secondary | ICD-10-CM | POA: Diagnosis not present

## 2019-11-10 DIAGNOSIS — R7989 Other specified abnormal findings of blood chemistry: Secondary | ICD-10-CM | POA: Diagnosis not present

## 2019-11-10 DIAGNOSIS — M791 Myalgia, unspecified site: Secondary | ICD-10-CM | POA: Diagnosis not present

## 2019-11-10 DIAGNOSIS — Z0000 Encounter for general adult medical examination without abnormal findings: Secondary | ICD-10-CM | POA: Diagnosis not present

## 2019-11-10 DIAGNOSIS — R5383 Other fatigue: Secondary | ICD-10-CM | POA: Diagnosis not present

## 2019-11-10 DIAGNOSIS — F419 Anxiety disorder, unspecified: Secondary | ICD-10-CM | POA: Diagnosis not present

## 2019-11-10 DIAGNOSIS — Z1389 Encounter for screening for other disorder: Secondary | ICD-10-CM | POA: Diagnosis not present

## 2019-11-10 DIAGNOSIS — I10 Essential (primary) hypertension: Secondary | ICD-10-CM | POA: Diagnosis not present

## 2019-11-30 DIAGNOSIS — D235 Other benign neoplasm of skin of trunk: Secondary | ICD-10-CM | POA: Diagnosis not present

## 2019-11-30 DIAGNOSIS — L708 Other acne: Secondary | ICD-10-CM | POA: Diagnosis not present

## 2019-11-30 DIAGNOSIS — L720 Epidermal cyst: Secondary | ICD-10-CM | POA: Diagnosis not present

## 2019-11-30 DIAGNOSIS — L821 Other seborrheic keratosis: Secondary | ICD-10-CM | POA: Diagnosis not present

## 2019-11-30 DIAGNOSIS — D1723 Benign lipomatous neoplasm of skin and subcutaneous tissue of right leg: Secondary | ICD-10-CM | POA: Diagnosis not present

## 2020-01-01 ENCOUNTER — Other Ambulatory Visit (HOSPITAL_COMMUNITY): Payer: 59

## 2020-01-01 ENCOUNTER — Ambulatory Visit (HOSPITAL_COMMUNITY): Payer: 59

## 2020-01-09 ENCOUNTER — Ambulatory Visit (HOSPITAL_COMMUNITY): Payer: 59 | Admitting: Nurse Practitioner

## 2020-01-11 ENCOUNTER — Ambulatory Visit (HOSPITAL_COMMUNITY): Payer: 59 | Admitting: Nurse Practitioner

## 2020-01-23 ENCOUNTER — Other Ambulatory Visit: Payer: Self-pay

## 2020-01-23 ENCOUNTER — Ambulatory Visit
Admission: RE | Admit: 2020-01-23 | Discharge: 2020-01-23 | Disposition: A | Discharge status: Home / Self Care | Payer: PPO | Source: Ambulatory Visit | Attending: Nurse Practitioner | Admitting: Nurse Practitioner

## 2020-01-23 DIAGNOSIS — Z1231 Encounter for screening mammogram for malignant neoplasm of breast: Secondary | ICD-10-CM | POA: Diagnosis not present

## 2020-01-24 ENCOUNTER — Inpatient Hospital Stay (HOSPITAL_COMMUNITY): Payer: PPO | Attending: Hematology

## 2020-01-24 ENCOUNTER — Other Ambulatory Visit: Payer: Self-pay

## 2020-01-24 DIAGNOSIS — Z800 Family history of malignant neoplasm of digestive organs: Secondary | ICD-10-CM | POA: Diagnosis not present

## 2020-01-24 DIAGNOSIS — Z833 Family history of diabetes mellitus: Secondary | ICD-10-CM | POA: Insufficient documentation

## 2020-01-24 DIAGNOSIS — M1990 Unspecified osteoarthritis, unspecified site: Secondary | ICD-10-CM | POA: Diagnosis not present

## 2020-01-24 DIAGNOSIS — Z801 Family history of malignant neoplasm of trachea, bronchus and lung: Secondary | ICD-10-CM | POA: Insufficient documentation

## 2020-01-24 DIAGNOSIS — R42 Dizziness and giddiness: Secondary | ICD-10-CM | POA: Diagnosis not present

## 2020-01-24 DIAGNOSIS — R0602 Shortness of breath: Secondary | ICD-10-CM | POA: Diagnosis not present

## 2020-01-24 DIAGNOSIS — Z885 Allergy status to narcotic agent status: Secondary | ICD-10-CM | POA: Insufficient documentation

## 2020-01-24 DIAGNOSIS — Z886 Allergy status to analgesic agent status: Secondary | ICD-10-CM | POA: Diagnosis not present

## 2020-01-24 DIAGNOSIS — C50412 Malignant neoplasm of upper-outer quadrant of left female breast: Secondary | ICD-10-CM | POA: Diagnosis not present

## 2020-01-24 DIAGNOSIS — G479 Sleep disorder, unspecified: Secondary | ICD-10-CM | POA: Diagnosis not present

## 2020-01-24 DIAGNOSIS — Z923 Personal history of irradiation: Secondary | ICD-10-CM | POA: Insufficient documentation

## 2020-01-24 DIAGNOSIS — Z811 Family history of alcohol abuse and dependence: Secondary | ICD-10-CM | POA: Diagnosis not present

## 2020-01-24 DIAGNOSIS — Z803 Family history of malignant neoplasm of breast: Secondary | ICD-10-CM | POA: Diagnosis not present

## 2020-01-24 DIAGNOSIS — Z171 Estrogen receptor negative status [ER-]: Secondary | ICD-10-CM | POA: Diagnosis not present

## 2020-01-24 DIAGNOSIS — Z79899 Other long term (current) drug therapy: Secondary | ICD-10-CM | POA: Insufficient documentation

## 2020-01-24 DIAGNOSIS — Z9012 Acquired absence of left breast and nipple: Secondary | ICD-10-CM | POA: Diagnosis not present

## 2020-01-24 DIAGNOSIS — R5383 Other fatigue: Secondary | ICD-10-CM | POA: Insufficient documentation

## 2020-01-24 LAB — COMPREHENSIVE METABOLIC PANEL
ALT: 29 U/L (ref 0–44)
AST: 27 U/L (ref 15–41)
Albumin: 4.1 g/dL (ref 3.5–5.0)
Alkaline Phosphatase: 62 U/L (ref 38–126)
Anion gap: 10 (ref 5–15)
BUN: 14 mg/dL (ref 8–23)
CO2: 25 mmol/L (ref 22–32)
Calcium: 9.6 mg/dL (ref 8.9–10.3)
Chloride: 102 mmol/L (ref 98–111)
Creatinine, Ser: 0.74 mg/dL (ref 0.44–1.00)
GFR calc Af Amer: >60 mL/min (ref >60)
GFR calc non Af Amer: >60 mL/min (ref >60)
Glucose, Bld: 91 mg/dL (ref 70–99)
Potassium: 4.3 mmol/L (ref 3.5–5.1)
Sodium: 137 mmol/L (ref 135–145)
Total Bilirubin: 0.7 mg/dL (ref 0.3–1.2)
Total Protein: 7.5 g/dL (ref 6.5–8.1)

## 2020-01-24 LAB — CBC WITH DIFFERENTIAL/PLATELET
Abs Immature Granulocytes: 0.01 10*3/uL (ref 0.00–0.07)
Basophils Absolute: 0 10*3/uL (ref 0.0–0.1)
Basophils Relative: 1 %
Eosinophils Absolute: 0.4 10*3/uL (ref 0.0–0.5)
Eosinophils Relative: 6 %
HCT: 45.4 % (ref 36.0–46.0)
Hemoglobin: 15.1 g/dL — ABNORMAL HIGH (ref 12.0–15.0)
Immature Granulocytes: 0 %
Lymphocytes Relative: 44 %
Lymphs Abs: 2.9 10*3/uL (ref 0.7–4.0)
MCH: 32.3 pg (ref 26.0–34.0)
MCHC: 33.3 g/dL (ref 30.0–36.0)
MCV: 97.2 fL (ref 80.0–100.0)
Monocytes Absolute: 0.5 10*3/uL (ref 0.1–1.0)
Monocytes Relative: 8 %
Neutro Abs: 2.7 10*3/uL (ref 1.7–7.7)
Neutrophils Relative %: 41 %
Platelets: 277 10*3/uL (ref 150–400)
RBC: 4.67 MIL/uL (ref 3.87–5.11)
RDW: 12.3 % (ref 11.5–15.5)
WBC: 6.4 10*3/uL (ref 4.0–10.5)
nRBC: 0 % (ref 0.0–0.2)

## 2020-01-24 LAB — IRON AND TIBC
Iron: 76 ug/dL (ref 28–170)
Saturation Ratios: 24 % (ref 10.4–31.8)
TIBC: 321 ug/dL (ref 250–450)
UIBC: 245 ug/dL

## 2020-01-24 LAB — VITAMIN B12: Vitamin B-12: 472 pg/mL (ref 180–914)

## 2020-01-24 LAB — FERRITIN: Ferritin: 103 ng/mL (ref 11–307)

## 2020-01-24 LAB — LACTATE DEHYDROGENASE: LDH: 141 U/L (ref 98–192)

## 2020-01-24 LAB — VITAMIN D 25 HYDROXY (VIT D DEFICIENCY, FRACTURES): Vit D, 25-Hydroxy: 36.8 ng/mL (ref 30–100)

## 2020-01-31 ENCOUNTER — Other Ambulatory Visit: Payer: Self-pay

## 2020-01-31 ENCOUNTER — Inpatient Hospital Stay (HOSPITAL_BASED_OUTPATIENT_CLINIC_OR_DEPARTMENT_OTHER): Payer: PPO | Admitting: Nurse Practitioner

## 2020-01-31 VITALS — BP 122/69 | HR 84 | Resp 18 | Wt 172.0 lb

## 2020-01-31 DIAGNOSIS — C50412 Malignant neoplasm of upper-outer quadrant of left female breast: Secondary | ICD-10-CM

## 2020-01-31 DIAGNOSIS — Z171 Estrogen receptor negative status [ER-]: Secondary | ICD-10-CM

## 2020-01-31 DIAGNOSIS — Z1231 Encounter for screening mammogram for malignant neoplasm of breast: Secondary | ICD-10-CM | POA: Diagnosis not present

## 2020-01-31 NOTE — Progress Notes (Signed)
[facility] [place], [place] [address]   CLINIC:  Medical Oncology/Hematology  PCP:  Sharilyn Sites, [address] [place] [address] [phone]   REASON FOR VISIT: Follow-up for breast cancer   CURRENT THERAPY: Surveillance  BRIEF ONCOLOGIC HISTORY:  Oncology History  Breast cancer of upper-outer quadrant of left female breast ([place])  11/07/1999 Surgery   Lumpectomy/sentinel LN biopsy invasive ductal carcinoma and DCIS at [facility], 0/6 sentinel LN   11/10/1999 - 01/08/2005 Anti-estrogen oral therapy   Tamoxifen 20 mg daily   11/24/1999 - 01/02/2000 Radiation Therapy     12/18/2015 Mammogram   2D digital screening bilateral mammogram with CAD and adjunct TOMO, in the L breast a possible mass warrants further evaluation. In R breast no findings suspicious for malignancy   12/24/2015 Pathology Results   Invasive ductal carcinoma Grade 3 Left core needle biopsy 1:30 o clock ER- PR- Ki67 70%, HER 2 positive   12/24/2015 Imaging   L breast Ultrasound, notes palpable area of concern demonstrates a hypoechoic irregular mass at 1:30, 7 cm from the nipple measuring 1.3 x 0.9 x 1.4 cm. No evidence of L axillary LAD   01/07/2016 Procedure   Left simple mastectomy by Dr. Georgette Dover   01/08/2016 Pathology Results   Breast, simple mastectomy, Left - INVASIVE DUCTAL CARCINOMA, GRADE III/III, SPANNING 1.4 CM. - THE SURGICAL RESECTION MARGINS ARE NEGATIVE FOR CARCINOMA.   01/23/2016 Genetic Testing   Patient refused genetic testing at time of consultation with Roma Kayser.  "Despite our recommendation, Ms. Zumstein did not wish to pursue genetic testing at today's visit."   01/28/2016 Echocardiogram   Left ventricle: The cavity size was normal. Wall thickness was normal. Systolic function was normal. The estimated ejection fraction was in the range of 55% to 60%. Wall motion was normal; there were no regional wall motion abnormalities.  Doppler parameters are consistent with abnormal left ventricular relaxation (grade 1 diastolic dysfunction).   01/31/2016 - 05/15/2016 Chemotherapy   The patient had palonosetron (ALOXI) injection 0.25 mg, 0.25 mg, Intravenous,  Once, 1 of 6 cycles  pegfilgrastim (NEULASTA ONPRO KIT) injection 6 mg, 6 mg, Subcutaneous, Once, 1 of 6 cycles  CARBOplatin (PARAPLATIN) 560 mg in sodium chloride 0.9 % 250 mL chemo infusion, 560 mg (100 % of original dose 558.5 mg), Intravenous,  Once, 1 of 6 cycles Dose modification:   (original dose 558.5 mg, Cycle 1),   (original dose 558.5 mg, Cycle 2)  DOCEtaxel (TAXOTERE) 140 mg in dextrose 5 % 250 mL chemo infusion, 75 mg/m2 = 140 mg, Intravenous,  Once, 1 of 6 cycles  for chemotherapy treatment.     01/31/2016 - 01/01/2017 Antibody Plan   Herceptin x 52 weeks   08/24/2016 Echocardiogram   Left ventricle: The cavity size was normal. Wall thickness was normal. Systolic function was normal. The estimated ejection fraction was in the range of 55% to 60%. Wall motion was normal; there were no regional wall motion abnormalities. Doppler parameters are consistent with abnormal left ventricular relaxation (grade 1 diastolic dysfunction).   11/18/2016 Echocardiogram   LV EF: 55% -  60%. - Left ventricle: The cavity size was normal. Wall thickness was   normal. Systolic function was normal. The estimated ejection   fraction was in the range of 55% to 60%. Wall motion was normal;   there were no regional wall motion abnormalities. Left   ventricular diastolic function parameters were normal. - Mitral valve: There was mild regurgitation. -  Tricuspid valve: There was mild regurgitation.     CANCER STAGING: Cancer Staging Breast cancer of upper-outer quadrant of left female breast [facility]) Staging form: Breast, AJCC 7th Edition - Pathologic stage from 01/08/2016: Stage IA (T1c, N0, cM0) - Signed by Baird Cancer, PA-C on 01/22/2016    INTERVAL  HISTORY:  Ms. Cranford 64 y.o. female returns for routine follow-up for breast cancer. She reports she is doing well since her last visit. She denies any new lumps or bumps present. She denies any bone pain. Denies any nausea, vomiting, or diarrhea. Denies any new pains. Had not noticed any recent bleeding such as epistaxis, hematuria or hematochezia. Denies recent chest pain on exertion, shortness of breath on minimal exertion, pre-syncopal episodes, or palpitations. Denies any numbness or tingling in hands or feet. Denies any recent fevers, infections, or recent hospitalizations. Patient reports appetite at 100% and energy level at 50%. She is eating well maintain her weight at this time.     REVIEW OF SYSTEMS:  Review of Systems  Constitutional: Positive for fatigue.  Respiratory: Positive for shortness of breath.   Neurological: Positive for dizziness.  Psychiatric/Behavioral: Positive for sleep disturbance.  All other systems reviewed and are negative.    PAST MEDICAL/SURGICAL HISTORY:  Past Medical History:  Diagnosis Date  . Anxiety   . Arthritis    knees  . Breast cancer ([place])   . Bulging of cervical intervertebral disc   . Dental crowns present   . Depression   . Family history of breast cancer   . Family history of colon cancer   . Fibromyalgia   . GERD (gastroesophageal reflux disease)   . History of chemotherapy   . History of left breast cancer 01/2016  . Multiple thyroid nodules   . Palpitations   . PONV (postoperative nausea and vomiting)    Past Surgical History:  Procedure Laterality Date  . ABDOMINAL HYSTERECTOMY  04/2017   complete  . COLONOSCOPY N/A 03/09/2017   Procedure: COLONOSCOPY;  Surgeon: Danie Binder, MD;  [facility];  Service: Endoscopy;  Laterality: N/A;  2:45pm  . LAPAROSCOPIC APPENDECTOMY N/A 11/10/2012   Procedure: APPENDECTOMY LAPAROSCOPIC;  Surgeon: Jamesetta So, MD;  Location: [facility];  Service: General;  Laterality: N/A;    . MASTECTOMY Left 01/07/2016   breast cancer  . MASTECTOMY W/ SENTINEL NODE BIOPSY Left 01/07/2016   Procedure: LEFT TOTAL MASTECTOMY WITH LEFT AXILLARY SENTINEL LYMPH NODE BIOPSY;  Surgeon: Donnie Mesa, MD;  Location: [facility];  Service: General;  Laterality: Left;  . PORT-A-CATH REMOVAL N/A 09/07/2017   Procedure: REMOVAL PORT-A-CATH;  Surgeon: Donnie Mesa, MD;  Location: [facility];  Service: General;  Laterality: N/A;  . PORTACATH PLACEMENT Right 01/07/2016   Procedure: INSERTION PORT-A-CATH RIGHT INTERNAL JUGULAR WITH ULTRASOUND;  Surgeon: Donnie Mesa, MD;  Location: [facility];  Service: General;  Laterality: Right;     SOCIAL HISTORY:  Social History   Socioeconomic History  . Marital status: Married    Spouse name: Not on file  . Number of children: 2  . Years of education: Not on file  . Highest education level: Not on file  Occupational History  . Not on file  Tobacco Use  . Smoking status: Never Smoker  . Smokeless tobacco: Never Used  Vaping Use  . Vaping Use: Never used  Substance and Sexual Activity  . Alcohol use: No  . Drug use: No  . Sexual activity: Yes  Other Topics Concern  .  Not on file  Social History Narrative  . Not on file   Social Determinants of Health   Financial Resource Strain:   . Difficulty of Paying Living Expenses: Not on file  Food Insecurity:   . Worried About Charity fundraiser in the Last Year: Not on file  . Ran Out of Food in the Last Year: Not on file  Transportation Needs:   . Lack of Transportation (Medical): Not on file  . Lack of Transportation (Non-Medical): Not on file  Physical Activity:   . Days of Exercise per Week: Not on file  . Minutes of Exercise per Session: Not on file  Stress:   . Feeling of Stress : Not on file  Social Connections:   . Frequency of Communication with Friends and Family: Not on file  . Frequency of Social Gatherings with Friends and Family: Not on file  . Attends Religious  Services: Not on file  . Active Member of Clubs or Organizations: Not on file  . Attends Archivist Meetings: Not on file  . Marital Status: Not on file  Intimate Partner Violence:   . Fear of Current or Ex-Partner: Not on file  . Emotionally Abused: Not on file  . Physically Abused: Not on file  . Sexually Abused: Not on file    FAMILY HISTORY:  Family History  Problem Relation Age of Onset  . Colon cancer Mother 57       deceased secondary to metastatic cancer  . Diabetes Father   . Diabetes Sister   . Alcohol abuse Brother   . Cancer Brother 1       lung cancer  . Cancer Brother        penile  . Breast cancer Maternal Aunt   . Cancer Other        unknown form of cancer    CURRENT MEDICATIONS:  Outpatient Encounter Medications as of 01/31/2020  Medication Sig Note  . ALPRAZolam (XANAX) 0.5 MG tablet Take 1 tablet (0.5 mg total) by mouth 2 (two) times daily as needed for anxiety. 06/28/2018: Usually just takes at night  . Cholecalciferol (VITAMIN D3) 2000 units TABS Take 2,000 Units by mouth every morning.    Marland Kitchen ibuprofen (ADVIL,MOTRIN) 800 MG tablet Take 800 mg by mouth as needed.    . metoprolol tartrate (LOPRESSOR) 25 MG tablet Take 1 tablet (25 mg total) by mouth 2 (two) times daily. 25 mg in the morning. 12.5 mg in the evening,   . rosuvastatin (CRESTOR) 5 MG tablet Take 5 mg by mouth daily.   . [DISCONTINUED] Cholecalciferol (VITAMIN D3 PO) Vitamin D3   . [DISCONTINUED] Metoprolol-hydroCHLOROthiazide (DUTOPROL PO) metoprolol su-hydrochlorothiaz   . [DISCONTINUED] prochlorperazine (COMPAZINE) 10 MG tablet Take 1 tablet (10 mg total) by mouth every 6 (six) hours as needed (Nausea or vomiting).    Facility-Administered Encounter Medications as of 01/31/2020  Medication  . sodium chloride flush (NS) 0.9 % injection 10 mL    ALLERGIES:  Allergies  Allergen Reactions  . Codeine Nausea And Vomiting  . Latex Itching and Rash  . Naprosyn [Naproxen] Itching and  Rash     PHYSICAL EXAM:  ECOG Performance status: 1  Vitals:   01/31/20 1032  BP: 122/69  Pulse: 84  Resp: 18  SpO2: 100%   Filed Weights   01/31/20 1032  Weight: 171 lb 15.3 oz (78 kg)   Physical Exam Constitutional:      Appearance: Normal appearance. She is normal weight.  Cardiovascular:     Rate and Rhythm: Normal rate and regular rhythm.     Heart sounds: Normal heart sounds.  Pulmonary:     Effort: Pulmonary effort is normal.     Breath sounds: Normal breath sounds.  Abdominal:     General: Bowel sounds are normal.     Palpations: Abdomen is soft.  Musculoskeletal:        General: Normal range of motion.  Skin:    General: Skin is warm.  Neurological:     Mental Status: She is alert and oriented to person, place, and time. Mental status is at baseline.  Psychiatric:        Mood and Affect: Mood normal.        Behavior: Behavior normal.        Thought Content: Thought content normal.        Judgment: Judgment normal.      LABORATORY DATA:  I have reviewed the labs as listed.  CBC    Component Value Date/Time   WBC 6.4 01/24/2020 1406   RBC 4.67 01/24/2020 1406   HGB 15.1 (H) 01/24/2020 1406   HCT 45.4 01/24/2020 1406   PLT 277 01/24/2020 1406   MCV 97.2 01/24/2020 1406   MCH 32.3 01/24/2020 1406   MCHC 33.3 01/24/2020 1406   RDW 12.3 01/24/2020 1406   LYMPHSABS 2.9 01/24/2020 1406   MONOABS 0.5 01/24/2020 1406   EOSABS 0.4 01/24/2020 1406   BASOSABS 0.0 01/24/2020 1406   CMP Latest Ref Rng & Units 01/24/2020 07/04/2019 12/27/2018  Glucose 70 - 99 mg/dL 91 101(H) 93  BUN 8 - 23 mg/dL _0 Creatinine 0.44 - 1.00 mg/dL 0.74 0.72 0.63  Sodium 135 - 145 mmol/L 137 136 136  Potassium 3.5 - 5.1 mmol/L 4.3 4.6 4.0  Chloride 98 - 111 mmol/L 102 102 104  CO2 22 - 32 mmol/L _1 Calcium 8.9 - 10.3 mg/dL 9.6 9.6 9.4  Total Protein 6.5 - 8.1 g/dL 7.5 8.1 7.7  Total Bilirubin 0.3 - 1.2 mg/dL 0.7 0.7 0.5  Alkaline Phos 38 - 126 U/L 62 77 69    AST 15 - 41 U/L _2 ALT 0 - 44 U/L 29 33 32    DIAGNOSTIC IMAGING:  I have independently reviewed the mammogram scan and discussed with the patient.  ASSESSMENT & PLAN:  Breast cancer of upper-outer quadrant of left female breast ([place]) 1.  Stage Ia left breast invasive ductal carcinoma: - Patient was diagnosed with invasive ductal carcinoma, ER negative/PR negative/HER-2 positive. -Patient is status post mastectomy and Taxotere\carbo\Herceptin x6 cycles; maintenance Herceptin every 3 weeks for 1 year, she completed that on 01/01/2017. -She did not require adjuvant radiation therapy.  Patient was not recommended for adjuvant antiestrogen therapy given ER negative. -Last echo on 11/18/2016 was normal with a EF of 55 to 60%. - Last mammogram done on 01/23/2020 was BI-RADS Category 1 negative. -Labs done on 01/24/2020 showed labs WNL. -She will follow-up with yearly appointments with mammogram and labs.      Orders placed this encounter:  Orders Placed This Encounter  Procedures  . MM 3D SCREEN BREAST UNI RIGHT  . Lactate dehydrogenase  . CBC with Differential/Platelet  . Comprehensive metabolic panel  . Vitamin B12  . VITAMIN D 25 Hydroxy (Vit-D Deficiency, Fractures)      Francene Finders, FNP-C [place] [phone]

## 2020-01-31 NOTE — Assessment & Plan Note (Signed)
1.  Stage Ia left breast invasive ductal carcinoma: - Patient was diagnosed with invasive ductal carcinoma, ER negative/PR negative/HER-2 positive. -Patient is status post mastectomy and Taxotere\carbo\Herceptin x6 cycles; maintenance Herceptin every 3 weeks for 1 year, she completed that on 01/01/2017. -She did not require adjuvant radiation therapy.  Patient was not recommended for adjuvant antiestrogen therapy given ER negative. -Last echo on 11/18/2016 was normal with a EF of 55 to 60%. - Last mammogram done on 01/23/2020 was BI-RADS Category 1 negative. -Labs done on 01/24/2020 showed labs WNL. -She will follow-up with yearly appointments with mammogram and labs.

## 2020-02-01 DIAGNOSIS — I8392 Asymptomatic varicose veins of left lower extremity: Secondary | ICD-10-CM | POA: Diagnosis not present

## 2020-02-01 DIAGNOSIS — L821 Other seborrheic keratosis: Secondary | ICD-10-CM | POA: Diagnosis not present

## 2020-02-01 DIAGNOSIS — D235 Other benign neoplasm of skin of trunk: Secondary | ICD-10-CM | POA: Diagnosis not present

## 2020-02-01 DIAGNOSIS — D225 Melanocytic nevi of trunk: Secondary | ICD-10-CM | POA: Diagnosis not present

## 2020-02-01 DIAGNOSIS — D1723 Benign lipomatous neoplasm of skin and subcutaneous tissue of right leg: Secondary | ICD-10-CM | POA: Diagnosis not present

## 2020-02-01 DIAGNOSIS — L819 Disorder of pigmentation, unspecified: Secondary | ICD-10-CM | POA: Diagnosis not present

## 2020-02-01 DIAGNOSIS — D224 Melanocytic nevi of scalp and neck: Secondary | ICD-10-CM | POA: Diagnosis not present

## 2020-03-05 ENCOUNTER — Encounter: Payer: Self-pay | Admitting: Internal Medicine

## 2020-05-30 DIAGNOSIS — Z1331 Encounter for screening for depression: Secondary | ICD-10-CM | POA: Diagnosis not present

## 2020-05-30 DIAGNOSIS — E049 Nontoxic goiter, unspecified: Secondary | ICD-10-CM | POA: Diagnosis not present

## 2020-05-30 DIAGNOSIS — Z1389 Encounter for screening for other disorder: Secondary | ICD-10-CM | POA: Diagnosis not present

## 2020-05-30 DIAGNOSIS — E7849 Other hyperlipidemia: Secondary | ICD-10-CM | POA: Diagnosis not present

## 2020-05-30 DIAGNOSIS — Z0001 Encounter for general adult medical examination with abnormal findings: Secondary | ICD-10-CM | POA: Diagnosis not present

## 2020-05-30 DIAGNOSIS — K219 Gastro-esophageal reflux disease without esophagitis: Secondary | ICD-10-CM | POA: Diagnosis not present

## 2020-05-30 DIAGNOSIS — F419 Anxiety disorder, unspecified: Secondary | ICD-10-CM | POA: Diagnosis not present

## 2020-05-30 DIAGNOSIS — R002 Palpitations: Secondary | ICD-10-CM | POA: Diagnosis not present

## 2020-05-30 DIAGNOSIS — Z6827 Body mass index (BMI) 27.0-27.9, adult: Secondary | ICD-10-CM | POA: Diagnosis not present

## 2020-06-21 ENCOUNTER — Other Ambulatory Visit: Payer: Self-pay | Admitting: Family Medicine

## 2020-06-21 ENCOUNTER — Other Ambulatory Visit (HOSPITAL_COMMUNITY): Payer: Self-pay | Admitting: Family Medicine

## 2020-06-21 DIAGNOSIS — M7552 Bursitis of left shoulder: Secondary | ICD-10-CM | POA: Diagnosis not present

## 2020-06-21 DIAGNOSIS — E049 Nontoxic goiter, unspecified: Secondary | ICD-10-CM

## 2020-06-21 DIAGNOSIS — E663 Overweight: Secondary | ICD-10-CM | POA: Diagnosis not present

## 2020-06-21 DIAGNOSIS — E2839 Other primary ovarian failure: Secondary | ICD-10-CM

## 2020-06-21 DIAGNOSIS — Z6827 Body mass index (BMI) 27.0-27.9, adult: Secondary | ICD-10-CM | POA: Diagnosis not present

## 2020-06-21 DIAGNOSIS — M25512 Pain in left shoulder: Secondary | ICD-10-CM | POA: Diagnosis not present

## 2020-07-29 ENCOUNTER — Ambulatory Visit: Payer: PPO | Admitting: Cardiology

## 2020-07-29 ENCOUNTER — Other Ambulatory Visit: Payer: Self-pay

## 2020-07-29 ENCOUNTER — Encounter: Payer: Self-pay | Admitting: Cardiology

## 2020-07-29 VITALS — BP 126/80 | HR 85 | Ht 66.0 in | Wt 166.2 lb

## 2020-07-29 DIAGNOSIS — R002 Palpitations: Secondary | ICD-10-CM

## 2020-07-29 DIAGNOSIS — R0602 Shortness of breath: Secondary | ICD-10-CM

## 2020-07-29 NOTE — Addendum Note (Signed)
Addended by: Levonne Hubert on: 07/29/2020 03:59 PM   Modules accepted: Orders

## 2020-07-29 NOTE — Patient Instructions (Signed)
Medication Instructions:  Your physician recommends that you continue on your current medications as directed. Please refer to the Current Medication list given to you today.   *If you need a refill on your cardiac medications before your next appointment, please call your pharmacy*   Lab Work: None today If you have labs (blood work) drawn today and your tests are completely normal, you will receive your results only by: Marland Kitchen MyChart Message (if you have MyChart) OR . A paper copy in the mail If you have any lab test that is abnormal or we need to change your treatment, we will call you to review the results.   Testing/Procedures: Your physician has requested that you have an echocardiogram. Echocardiography is a painless test that uses sound waves to create images of your heart. It provides your doctor with information about the size and shape of your heart and how well your heart's chambers and valves are working. This procedure takes approximately one hour. There are no restrictions for this procedure.     Follow-Up: At [facility], you and your health needs are our priority.  As part of our continuing mission to provide you with exceptional heart care, we have created designated Provider Care Teams.  These Care Teams include your primary Cardiologist (physician) and Advanced Practice Providers (APPs -  Physician Assistants and Nurse Practitioners) who all work together to provide you with the care you need, when you need it.  We recommend signing up for the patient portal called "MyChart".  Sign up information is provided on this After Visit Summary.  MyChart is used to connect with patients for Virtual Visits (Telemedicine).  Patients are able to view lab/test results, encounter notes, upcoming appointments, etc.  Non-urgent messages can be sent to your provider as well.   To learn more about what you can do with MyChart, go to [email or web address].    Your next appointment:  To  be determined, we will call you with test results.     Thank you for choosing [facility] !

## 2020-07-29 NOTE — Progress Notes (Signed)
Cardiology Office Note  Date: 07/29/2020   ID: [name], DOB 1955/10/13, MRN [id number]  PCP:  Sharilyn Sites, MD  Cardiologist:  Rozann Lesches, MD Electrophysiologist:  None   Chief Complaint  Patient presents with  . Palpitations    History of Present Illness: [name] is a 65 y.o. female referred for cardiology consultation by Dr. Hilma Favors for the assessment of palpitations.  She states that she has had a sensation of intermittent heart skipping for many years.  Since January of this year, she has been walking with her husband for exercise, usually about 2 miles at a time.  She has also modified her diet and cut back on red meats.  Since that time she has noticed an improvement in her palpitations.  Feels somewhat short of breath after walking a mile, no chest pain however, no dizziness or syncope.  I personally reviewed her ECG today which is normal.  She does have a history of breast cancer status post chemotherapy as noted below.  Her last echocardiogram was in 2018 at which point LVEF was 55 to 60%.  I reviewed her medications which are outlined below.  Past Medical History:  Diagnosis Date  . Anxiety   . Arthritis   . Bulging of cervical intervertebral disc   . Dental crowns present   . Depression   . Fibromyalgia   . GERD (gastroesophageal reflux disease)   . History of chemotherapy 2017   Docetaxel, Carboplatin, Herceptin  . History of left breast cancer 01/2016  . Multiple thyroid nodules   . Palpitations     Past Surgical History:  Procedure Laterality Date  . ABDOMINAL HYSTERECTOMY  04/2017   complete  . COLONOSCOPY N/A 03/09/2017   Procedure: COLONOSCOPY;  Surgeon: Danie Binder, MD;  [facility];  Service: Endoscopy;  Laterality: N/A;  2:45pm  . LAPAROSCOPIC APPENDECTOMY N/A 11/10/2012   Procedure: APPENDECTOMY LAPAROSCOPIC;  Surgeon: Jamesetta So, MD;  Location: [facility];  Service: General;  Laterality: N/A;  . MASTECTOMY  Left 01/07/2016   breast cancer  . MASTECTOMY W/ SENTINEL NODE BIOPSY Left 01/07/2016   Procedure: LEFT TOTAL MASTECTOMY WITH LEFT AXILLARY SENTINEL LYMPH NODE BIOPSY;  Surgeon: Donnie Mesa, MD;  Location: [facility];  Service: General;  Laterality: Left;  . PORT-A-CATH REMOVAL N/A 09/07/2017   Procedure: REMOVAL PORT-A-CATH;  Surgeon: Donnie Mesa, MD;  Location: [facility];  Service: General;  Laterality: N/A;  . PORTACATH PLACEMENT Right 01/07/2016   Procedure: INSERTION PORT-A-CATH RIGHT INTERNAL JUGULAR WITH ULTRASOUND;  Surgeon: Donnie Mesa, MD;  Location: [facility];  Service: General;  Laterality: Right;    Current Outpatient Medications  Medication Sig Dispense Refill  . ALPRAZolam (XANAX) 0.5 MG tablet Take 1 tablet (0.5 mg total) by mouth 2 (two) times daily as needed for anxiety. 60 tablet 1  . Cholecalciferol (VITAMIN D3) 2000 units TABS Take 2,000 Units by mouth every morning.     . Cyanocobalamin (VITAMIN B 12 PO) Take by mouth.    . metoprolol tartrate (LOPRESSOR) 25 MG tablet Take 1 tablet (25 mg total) by mouth 2 (two) times daily. 25 mg in the morning. 12.5 mg in the evening, 60 tablet 0  . rosuvastatin (CRESTOR) 5 MG tablet Take 5 mg by mouth daily.     No current facility-administered medications for this visit.   Facility-Administered Medications Ordered in Other Visits  Medication Dose Route Frequency Provider Last Rate Last Admin  . sodium chloride  flush (NS) 0.9 % injection 10 mL  10 mL Intracatheter PRN Penland, Kelby Fam, MD       Allergies:  Codeine, Latex, and Naprosyn [naproxen]   Social History: The patient  reports that she has never smoked. She has never used smokeless tobacco. She reports that she does not drink alcohol and does not use drugs.   Family History: The patient's family history includes Alcohol abuse in her brother; Breast cancer in her maternal aunt; Cancer in her brother and another family member; Colon cancer (age of onset: 52) in her  mother; Diabetes in her father and sister; Lung cancer (age of onset: 63) in her brother.   ROS: No sudden dizziness or syncope.  Physical Exam: VS:  BP 126/80   Pulse 85   Ht 5\' 6"  (1.676 m)   Wt 166 lb 3.2 oz (75.4 kg)   SpO2 97%   BMI 26.83 kg/m , BMI Body mass index is 26.83 kg/m.  Wt Readings from Last 3 Encounters:  07/29/20 166 lb 3.2 oz (75.4 kg)  01/31/20 171 lb 15.3 oz (78 kg)  07/11/19 177 lb 4 oz (80.4 kg)    General: Patient appears comfortable at rest. HEENT: Conjunctiva and lids normal, wearing a mask. Neck: Supple, no elevated JVP or carotid bruits, no thyromegaly. Lungs: Clear to auscultation, nonlabored breathing at rest. Cardiac: Regular rate and rhythm, no S3 or significant systolic murmur, no pericardial rub. Abdomen: Soft, nontender, bowel sounds present. Extremities: No pitting edema, distal pulses 2+. Skin: Warm and dry. Musculoskeletal: No kyphosis. Neuropsychiatric: Alert and oriented x3, affect grossly appropriate.  ECG:  An ECG dated 05/30/2020 was personally reviewed today and demonstrated:  Normal sinus rhythm.  Recent Labwork: 01/24/2020: ALT 29; AST 27; BUN 14; Creatinine, Ser 0.74; Hemoglobin 15.1; Platelets 277; Potassium 4.3; Sodium 137  January 2022: Hemoglobin 16.4, platelets 327, BUN 12, creatinine 0.72, potassium 4.6, AST 26, ALT 27, cholesterol 222, triglycerides 182, HDL 60, LDL 130, TSH 2.19  Other Studies Reviewed Today:  Echocardiogram 11/18/2016: - Left ventricle: The cavity size was normal. Wall thickness was  normal. Systolic function was normal. The estimated ejection  fraction was in the range of 55% to 60%. Wall motion was normal;  there were no regional wall motion abnormalities. Left  ventricular diastolic function parameters were normal.  - Mitral valve: There was mild regurgitation.  - Tricuspid valve: There was mild regurgitation.   Assessment and Plan:  Longstanding history of palpitations, currently with  improvement in symptoms since she has been walking regularly and has also cut back red meat in her diet.  Today's ECG is normal.  TSH was normal in January of this year.  She does report some dyspnea on exertion as discussed above, we will obtain a follow-up echocardiogram to ensure structurally normal heart particularly with her previous history of chemotherapy, although it would not be typical to see a decline in LVEF at this point.  If palpitations resurface more regularly, outpatient cardiac monitoring can be considered.  She is already on a low-dose beta-blocker.  Medication Adjustments/Labs and Tests Ordered: Current medicines are reviewed at length with the patient today.  Concerns regarding medicines are outlined above.   Tests Ordered: Orders Placed This Encounter  Procedures  . ECHOCARDIOGRAM COMPLETE    Medication Changes: No orders of the defined types were placed in this encounter.   Disposition:  Follow up test results.  Signed, Satira Sark, MD, [facility] 07/29/2020 2:32 PM    [facility] [place]. [address] [place], [place] [address] Phone: [phone]; Fax: [phone]

## 2020-08-05 ENCOUNTER — Ambulatory Visit (HOSPITAL_COMMUNITY)
Admission: RE | Admit: 2020-08-05 | Discharge: 2020-08-05 | Disposition: A | Discharge status: Home / Self Care | Payer: PPO | Source: Ambulatory Visit | Attending: Family Medicine | Admitting: Family Medicine

## 2020-08-05 DIAGNOSIS — M810 Age-related osteoporosis without current pathological fracture: Secondary | ICD-10-CM | POA: Diagnosis not present

## 2020-08-05 DIAGNOSIS — E2839 Other primary ovarian failure: Secondary | ICD-10-CM | POA: Diagnosis not present

## 2020-08-05 DIAGNOSIS — Z780 Asymptomatic menopausal state: Secondary | ICD-10-CM | POA: Diagnosis not present

## 2020-08-05 DIAGNOSIS — M85852 Other specified disorders of bone density and structure, left thigh: Secondary | ICD-10-CM | POA: Diagnosis not present

## 2020-08-13 ENCOUNTER — Other Ambulatory Visit: Payer: Self-pay

## 2020-08-13 ENCOUNTER — Ambulatory Visit (HOSPITAL_COMMUNITY)
Admission: RE | Admit: 2020-08-13 | Discharge: 2020-08-13 | Disposition: A | Discharge status: Home / Self Care | Payer: PPO | Source: Ambulatory Visit | Attending: Family Medicine | Admitting: Family Medicine

## 2020-08-13 DIAGNOSIS — E042 Nontoxic multinodular goiter: Secondary | ICD-10-CM | POA: Diagnosis not present

## 2020-08-13 DIAGNOSIS — E049 Nontoxic goiter, unspecified: Secondary | ICD-10-CM | POA: Insufficient documentation

## 2020-08-20 ENCOUNTER — Other Ambulatory Visit: Payer: Self-pay

## 2020-08-20 ENCOUNTER — Ambulatory Visit (HOSPITAL_COMMUNITY)
Admission: RE | Admit: 2020-08-20 | Discharge: 2020-08-20 | Disposition: A | Discharge status: Home / Self Care | Payer: PPO | Source: Ambulatory Visit | Attending: Cardiology | Admitting: Cardiology

## 2020-08-20 DIAGNOSIS — R0602 Shortness of breath: Secondary | ICD-10-CM | POA: Diagnosis not present

## 2020-08-20 LAB — ECHOCARDIOGRAM COMPLETE
AR max vel: 3.16 cm2
AV Area VTI: 3.36 cm2
AV Area mean vel: 3.15 cm2
AV Mean grad: 2.6 mmHg
AV Peak grad: 5.3 mmHg
Ao pk vel: 1.15 m/s
Area-P 1/2: 3.56 cm2
S' Lateral: 2.75 cm

## 2020-08-20 NOTE — Progress Notes (Signed)
  Echocardiogram 2D Echocardiogram has been performed.  [name] 08/20/2020, 12:59 PM

## 2020-10-21 DIAGNOSIS — N816 Rectocele: Secondary | ICD-10-CM | POA: Diagnosis not present

## 2020-10-21 DIAGNOSIS — N8189 Other female genital prolapse: Secondary | ICD-10-CM | POA: Diagnosis not present

## 2020-11-05 ENCOUNTER — Other Ambulatory Visit: Payer: Self-pay | Admitting: Hematology

## 2020-11-05 ENCOUNTER — Other Ambulatory Visit: Payer: Self-pay | Admitting: Family Medicine

## 2020-11-05 DIAGNOSIS — Z1231 Encounter for screening mammogram for malignant neoplasm of breast: Secondary | ICD-10-CM

## 2020-11-06 ENCOUNTER — Encounter: Payer: Self-pay | Admitting: Physical Therapy

## 2020-11-06 ENCOUNTER — Ambulatory Visit: Payer: PPO | Attending: Obstetrics and Gynecology | Admitting: Physical Therapy

## 2020-11-06 ENCOUNTER — Other Ambulatory Visit: Payer: Self-pay

## 2020-11-06 DIAGNOSIS — M6281 Muscle weakness (generalized): Secondary | ICD-10-CM | POA: Diagnosis not present

## 2020-11-06 DIAGNOSIS — R278 Other lack of coordination: Secondary | ICD-10-CM | POA: Diagnosis not present

## 2020-11-06 DIAGNOSIS — R252 Cramp and spasm: Secondary | ICD-10-CM | POA: Diagnosis not present

## 2020-11-06 NOTE — Therapy (Signed)
[facility] [address], [place], [place], [address] Phone: [phone]   Fax:  [phone]  Physical Therapy Evaluation  Patient Details  Name: NOVEMBER SYPHER MRN: [id number] Date of Birth: 05/22/1955 Referring Provider (PT): Dr. Dian Queen   Encounter Date: 11/06/2020   PT End of Session - 11/06/20 2138     Visit Number 1    Date for PT Re-Evaluation 01/29/21    Authorization Type [employer]    Authorization - Visit Number 1    Authorization - Number of Visits 10    PT Start Time 1194    PT Stop Time 1530    PT Time Calculation (min) 45 min    Activity Tolerance Patient tolerated treatment well;Patient limited by pain    Behavior During Therapy [facility] for tasks assessed/performed             Past Medical History:  Diagnosis Date   Anxiety    Arthritis    Bulging of cervical intervertebral disc    Dental crowns present    Depression    Fibromyalgia    GERD (gastroesophageal reflux disease)    History of chemotherapy 2017   Docetaxel, Carboplatin, Herceptin   History of left breast cancer 01/2016   Multiple thyroid nodules    Palpitations     Past Surgical History:  Procedure Laterality Date   ABDOMINAL HYSTERECTOMY  04/2017   complete   COLONOSCOPY N/A 03/09/2017   Procedure: COLONOSCOPY;  Surgeon: Danie Binder, MD;  [facility];  Service: Endoscopy;  Laterality: N/A;  2:45pm   LAPAROSCOPIC APPENDECTOMY N/A 11/10/2012   Procedure: APPENDECTOMY LAPAROSCOPIC;  Surgeon: Jamesetta So, MD;  Location: [facility];  Service: General;  Laterality: N/A;   MASTECTOMY Left 01/07/2016   breast cancer   MASTECTOMY W/ SENTINEL NODE BIOPSY Left 01/07/2016   Procedure: LEFT TOTAL MASTECTOMY WITH LEFT AXILLARY SENTINEL LYMPH NODE BIOPSY;  Surgeon: Donnie Mesa, MD;  Location: [place];  Service: General;  Laterality: Left;   PORT-A-CATH REMOVAL N/A 09/07/2017   Procedure: REMOVAL PORT-A-CATH;   Surgeon: Donnie Mesa, MD;  Location: [place];  Service: General;  Laterality: N/A;   PORTACATH PLACEMENT Right 01/07/2016   Procedure: INSERTION PORT-A-CATH RIGHT INTERNAL JUGULAR WITH ULTRASOUND;  Surgeon: Donnie Mesa, MD;  Location: [place];  Service: General;  Laterality: Right;    There were no vitals filed for this visit.    Subjective Assessment - 11/06/20 1454     Subjective Patient feels like something is heavy in the pelvis. The rectum has pressure all the time. She feels like she needs to have a bowel movement but does not.    Currently in Pain? Yes    Pain Score 7     Pain Location Groin    Pain Orientation Right;Left;Lower    Pain Descriptors / Indicators Discomfort    Pain Type Chronic pain    Pain Onset More than a month ago    Pain Frequency Intermittent    Aggravating Factors  when she is active    Pain Relieving Factors just lets it pass    Multiple Pain Sites No                [facility] PT Assessment - 11/06/20 0001       Assessment   Medical Diagnosis N81.89 Other female genital prolapse    Referring Provider (PT) Dr. Dian Queen    Prior Therapy none  Precautions   Precautions Other (comment)    Precaution Comments breast cancer with masectomy and chemotherapy      Restrictions   Weight Bearing Restrictions No      Balance Screen   Has the patient fallen in the past 6 months No    Has the patient had a decrease in activity level because of a fear of falling?  No    Is the patient reluctant to leave their home because of a fear of falling?  No      Home Ecologist residence      Prior Function   Level of Independence Independent    Leisure garden, walking      Cognition   Overall Cognitive Status Within Functional Limits for tasks assessed      Posture/Postural Control   Posture/Postural Control No significant limitations      ROM / Strength   AROM / PROM / Strength  AROM;PROM;Strength      AROM   Lumbar Extension decreased by 50%    Lumbar - Right Side Bend decreased by 25%    Lumbar - Left Side Bend decreased 25%    Lumbar - Right Rotation decreased 25%    Lumbar - Left Rotation decreased 25%      PROM   Overall PROM Comments decreased hip P/ROM for hip abduction      Strength   Overall Strength Comments truble standing on 1 leg    Right Hip ABduction 4/5    Right Hip ADduction 5/5    Left Hip ABduction 4-/5    Left Hip ADduction 5/5      Palpation   Palpation comment tenderness located throughout her abdomen, fascial restrictions in the lower abdomen where the hysterectomy scar was; decresaed opening of the lower rib cage                        Objective measurements completed on examination: See above findings.     Pelvic Floor Special Questions - 11/06/20 0001     Prior Pregnancies Yes    Number of Vaginal Deliveries 2    Any difficulty with labor and deliveries No    Currently Sexually Active Yes    Is this Painful Yes   pain with deep pentration   Urinary Leakage Yes    Urinary urgency Yes   every 45 mintues   Urinary frequency does not feel like she empties her bladder fully    Fecal incontinence No   lots of gas comes out; straining to have a bowel movement, takes miralax which has improved   Fluid intake mostly water    Falling out feeling (prolapse) Yes    Activities that cause feeling of prolapse walking, pulling or lifting    Skin Integrity Intact;Other    Skin Integrity other dryness    Perineal Body/Introitus  Elevated;Other    Perineal Body/Introitus other restricted and tender    Prolapse Other    Prolapse other unable to assess due to the introitus being so painful    Pelvic Floor Internal Exam Patient confirms identification and approves PT to assess pelvic floor and treatment    Exam Type Vaginal    Palpation tenderness in the levator ani, obturator internits; tightness and tenderness located in  the posterior wall of the vaginal canal    Strength Flicker    Tone increased  PT Education - 11/06/20 2138     Education Details education on vaginal moisturizers    Person(s) Educated Patient    Methods Explanation;Handout    Comprehension Verbalized understanding              PT Short Term Goals - 11/06/20 2149       PT SHORT TERM GOAL #1   Title independent with initial HEP for stretches and diaphragmatic breathing    Time 4    Period Weeks    Status New    Target Date 12/04/20      PT SHORT TERM GOAL #2   Title understand vaginal moisturizers and how they can help with the health of the tissue    Time 4    Period Weeks    Status New    Target Date 12/04/20      PT SHORT TERM GOAL #3   Title able to have the therapist work on the vaginal tissue without increased pain    Time 4    Period Weeks    Status New    Target Date 12/04/20      PT SHORT TERM GOAL #4   Title --    Time --    Period --    Status --    Target Date --      PT SHORT TERM GOAL #5   Title --    Time --    Period --    Status --    Target Date --               PT Long Term Goals - 11/06/20 2205       PT LONG TERM GOAL #1   Title independent with advanced HEP    Time 12    Period Weeks    Status New    Target Date 01/29/21      PT LONG TERM GOAL #2   Title able to have vaginally penetrative intercourse with minimal pain due to improve moisture of the vaginal canal and reduction of pelvic floor trigger points    Time 12    Period Weeks    Status New    Target Date 01/29/21      PT LONG TERM GOAL #3   Title ability to fully empty her bladder due to improve pelvic floor coordination and relaxation    Time 12    Period Weeks    Status New    Target Date 01/29/21      PT LONG TERM GOAL #4   Title understand ways to manage prolapse and improve pressure management    Time 12    Period Weeks    Status New    Target Date 01/29/21       PT LONG TERM GOAL #5   Title decreased pelvic pressure >/= 75% due to improved strength >/= 3/5    Time 12    Period Weeks    Status New    Target Date 01/29/21                    Plan - 11/06/20 2139     Clinical Impression Statement Patient is a 64 year old female with prolapse. She has a history on breast cancer 2x with treatment of masectomy, radiation and chemotherapy. Patient reprots pain with intercourse with deeper penetration. She will leak urine and not empty her bladder fully. she feels heaviness in the pelvic and a constant urge to have  a bowel movement. Lumbar ROM is decreased by 25%. She has weakness in her hip adductors. She has tenderness located throughout the abdominals and decreased lower rib cage movement with breath. She has to take Miralax to have a bowel movement. Her pelvic floor strength is 1/5 with pain in the introitus, levator ani and obturator internist. The perineal body and posterior vaginal canal  is restricted and painful. Unable to look for weakness of the anterior and posterior vaginal wall due to the pain. Patient does not have penile penetration vaginally often due to the pain.  Patient will benefit from skilled therapy to improve vaginal health, reduce muscle tone and trigger poitns and improve coordination.    Personal Factors and Comorbidities Fitness;Past/Current Experience;Time since onset of injury/illness/exacerbation;Sex;Comorbidity 3+    Comorbidities Breast cancer 2x with left masectomy, chemotherapy and radiation; hysterectomy; Fibromyalgia; Abdominal Hysterectomy 11/10/2012; left masectomy 01/07/2016    Examination-Activity Limitations Squat;Stairs;Stand;Toileting;Continence;Transfers    Examination-Participation Restrictions Interpersonal Relationship;Laundry;Yard Work;Community Activity    Stability/Clinical Decision Making Evolving/Moderate complexity    Clinical Decision Making Moderate    Rehab Potential Good    PT Frequency 1x / week     PT Duration 12 weeks    PT Treatment/Interventions ADLs/Self Care Home Management;Biofeedback;Electrical Stimulation;Therapeutic activities;Therapeutic exercise;Neuromuscular re-education;Patient/family education;Manual techniques;Scar mobilization;Dry needling;Spinal Manipulations;Joint Manipulations    PT Next Visit Plan abdominal massage, scar massage for suprapubically; hip stretches; diaphragmatic breathing; see how the creams go for vaginal moisturizers    Consulted and Agree with Plan of Care Patient             Patient will benefit from skilled therapeutic intervention in order to improve the following deficits and impairments:  Decreased endurance, Decreased scar mobility, Pain, Increased fascial restricitons, Decreased strength, Decreased activity tolerance, Increased muscle spasms, Decreased range of motion, Decreased coordination  Visit Diagnosis: Muscle weakness (generalized) - Plan: PT plan of care cert/re-cert  Cramp and spasm - Plan: PT plan of care cert/re-cert  Other lack of coordination - Plan: PT plan of care cert/re-cert     Problem List Patient Active Problem List   Diagnosis Date Noted   Constipation 02/18/2017   Heme + stool 02/18/2017   Family history of breast cancer    Family history of colon cancer    Breast cancer of upper-outer quadrant of left female breast ([place]) 01/07/2016   Sprain of foot, left 01/15/2015   Sprain of foot 01/15/2015   Sprained ankle 01/15/2015   Mild mitral insufficiency 06/13/2013   Palpitations 06/13/2013   IMPINGEMENT SYNDROME 07/03/2009    Earlie Counts, PT 11/06/20 10:15 PM  [facility] [address] [place], [place], [address] Phone: [phone]   Fax:  [phone]  Name: LASHICA HANNAY MRN: [id number] Date of Birth: 11-21-1955

## 2020-11-06 NOTE — Patient Instructions (Signed)
Moisturizers They are used in the vagina to hydrate the mucous membrane that make up the vaginal canal. Designed to keep a more normal acid balance (ph) Once placed in the vagina, it will last between two to three days.  Use 2-3 times per week at bedtime  Ingredients to avoid is glycerin and fragrance, can increase chance of infection Should not be used just before sex due to causing irritation Most are gels administered either in a tampon-shaped applicator or as a vaginal suppository. They are non-hormonal.   Types of Moisturizers(internal use)  Vitamin E vaginal suppositories- Whole foods, Amazon Moist Again Coconut oil- can break down condoms Julva- (Do no use if on Tamoxifen) amazon Yes moisturizer- amazon NeuEve Silk , NeuEve Silver for menopausal or over 65 (if have severe vaginal atrophy or cancer treatments use NeuEve Silk for  1 month than move to [employer])- [employer], [employer].com Olive and Bee intimate cream- www.oliveandbee.com.au Mae vaginal moisturizer- Amazon Aloe    Creams to use externally on the Vulva area Alber[employer]on's (good for for cancer patients that had radiation to the area)- [place] or [employer].FlyingBasics.com.br V-magic cream - amazon Julva-amazon Vital "V [place] salve ( help moisturize and help with thinning vulvar area, does have [place] by Irwin Brakeman labial moisturizer (Amazon,  Coconut or olive oil aloe   Things to avoid in the vaginal area Do not use things to irritate the vulvar area No lotions just specialized creams for the vulva area- Neogyn, V-magic, No soaps; can use Aveeno or Calendula cleanser if needed. Must be gentle No deodorants No douches Good to sleep without underwear to let the vaginal area to air out No scrubbing: spread the lips to let warm water rinse over labias and pat dry   About Pelvic Support Problems Pelvic Support Problems  Explained Ligaments, muscles, and connective tissue normally hold your bladder, uterus, and other organs in their proper places in your pelvis. When these tissues become weak, a problem with pelvic support may result. Weak support can cause one or more of the pelvic organs to drop down into the vagina. An organ may even drop so far that is partially exposed outside the body.  Pelvic support problems are named by the change in the organ. The main types of pelvic support problems are:  Cystocele: When the bladder drops down into your vagina.  Enterocele: When your small intestine drops between your vagina and rectum.  Rectocele: When your rectum bulges into the vaginal wall.  Uterine prolapse: When your uterus drops into your vagina.  Vaginal prolapse: When the top part of the vagina begins to droop. This sometimes happens after a hysterectomy (removal of the uterus).  Causes Pelvic support problems can be caused by many conditions. They may begin after you give birth, especially if you had a large baby. During childbirth, the muscles and skin of the birth canal (vagina) are stretched and sometimes torn. They heal over time but are not always exactly the same. A long pushing stage of labor may also weaken these tissues as well as very rapid births as the tissues do not have time to stretch so they tear.  Also, after menopause, there are changes in the vaginal walls resulting from a decrease in estrogen. Estrogen helps to keep the tissues toned. Low levels of estrogen weaken the vaginal walls and may cause the bladder to shift from its normal position. As women get older, the loss  of muscle tone and the relaxation of muscles may cause the uterus or other organs to drop.  Over time, conditions like chronic coughing, chronic constipation, doing a lot of heavy lifting, straining to pass stool, and obesity, can also weaken the pelvic support muscles.  Diagnosing Pelvic Support Problems Your health care  provider will ask about your symptoms and do a pelvic examination. Your provider may also do a rectal exam during your pelvic exam. Your provider may ask you to: 1. Bear down and push (like you are having a bowel movement) so he or she can see if your bladder or other part of your body protrudes into the vagina. 2. Contract the muscles of your pelvis to check the strength of your pelvic muscles.  3. Do several types of urine, nerve and muscle tests of the pelvis and around the bladder to see what type of treatment is best for you.   Symptoms Symptoms of pelvic support problems depend on the organ involved, but may include:  urine leakage  stain or fecal loss after a bowel movement trouble having bowel movements  ache in the lower abdomen, groin, or lower back  bladder infection  a feeling of heaviness, pulling, or fullness in the pelvis, or a feeling that something is falling out of the vagina  an organ protruding from your vaginal opening  feeling the need to support the organs or perineal area to empty bladder or bowels painful sexual intercourse.  Many women feel pelvic pressure or trouble holding their urine immediately after childbirth. For some, these symptoms go away permanently, in others they return as they get older.  Treatment Options A prolapsed organ cannot repair itself. Contact your health care provider as soon as you notice symptoms of a problem. Treatment depends on what the specific problem is and how far advanced it is.  The symptoms caused by some pelvic support problems may simply be treated with changes in diet, medicine to soften the stool, weight loss, or avoiding strenuous activities. You may also do pelvic floor exercises to help strengthen your pelvic muscles.  Some cases of prolapse may require a special support device made from plastic or rubber called a pessary that fits into the vagina to support the uterus, vagina, or bladder. A pessary can also help women who leak  urine when coughing, straining, or exercising. In mild cases, a tampon or vaginal diaphragm may be used instead of a pessary.  Talk to your doctor or health care provider about these options. In serious cases, surgery may be needed to put the organs back into their proper place. The uterus may be removed because of the pressure it puts on the bladder.  Your doctor will know what surgery will be best for you. How can I prevent pelvic support problems?  You can help prevent pelvic support problems by:  maintaining a healthy lifestyle  continuing to do pelvic floor exercises after you deliver a baby  maintaining a healthy weight  avoiding a lot of heavy lifting and lifting with your legs (not from your waist)  treating constipation and avoid getting   [facility] [address], [place], [place] [address] Phone # [phone] Fax [phone]

## 2020-11-20 ENCOUNTER — Ambulatory Visit: Payer: PPO | Admitting: Physical Therapy

## 2020-11-20 ENCOUNTER — Other Ambulatory Visit: Payer: Self-pay

## 2020-11-20 ENCOUNTER — Encounter: Payer: Self-pay | Admitting: Physical Therapy

## 2020-11-20 DIAGNOSIS — R252 Cramp and spasm: Secondary | ICD-10-CM

## 2020-11-20 DIAGNOSIS — M6281 Muscle weakness (generalized): Secondary | ICD-10-CM | POA: Diagnosis not present

## 2020-11-20 DIAGNOSIS — R278 Other lack of coordination: Secondary | ICD-10-CM

## 2020-11-20 NOTE — Patient Instructions (Addendum)
About Abdominal Massage  Abdominal massage, also called external colon massage, is a self-treatment circular massage technique that can reduce and eliminate gas and ease constipation. The colon naturally contracts in waves in a clockwise direction starting from inside the right hip, moving up toward the ribs, across the belly, and down inside the left hip.  When you perform circular abdominal massage, you help stimulate your colon's normal wave pattern of movement called peristalsis.  It is most beneficial when done after eating.  Positioning You can practice abdominal massage with oil while lying down, or in the shower with soap.  Some people find that it is just as effective to do the massage through clothing while sitting or standing.  How to Massage Start by placing your finger tips or knuckles on your right side, just inside your hip bone.  Make small circular movements while you move upward toward your rib cage.   Once you reach the bottom right side of your rib cage, take your circular movements across to the left side of the bottom of your rib cage.  Next, move downward until you reach the inside of your left hip bone.  This is the path your feces travel in your colon. Continue to perform your abdominal massage in this pattern for 10 minutes each day.     You can apply as much pressure as is comfortable in your massage.  Start gently and build pressure as you continue to practice.  Notice any areas of pain as you massage; areas of slight pain may be relieved as you massage, but if you have areas of significant or intense pain, consult with your healthcare provider.  Other Considerations General physical activity including bending and stretching can have a beneficial massage-like effect on the colon.  Deep breathing can also stimulate the colon because breathing deeply activates the same nervous system that supplies the colon.   Abdominal massage should always be used in combination with a  bowel-conscious diet that is high in the proper type of fiber for you, fluids (primarily water), and a regular exercise program.  Access Code: [email or web address] URL: [email or web address] Date: 11/20/2020 Prepared by: Earlie Counts  Exercises Supine Single Knee to Chest - 1 x daily - 4 x weekly - 1 sets - 2 reps Supine Hamstring Stretch - 1 x daily - 4 x weekly - 1 sets - 2 reps - 30 sec hold Supine Figure 4 Piriformis Stretch - 1 x daily - 4 x weekly - 1 sets - 2 reps - 30 sec hold  [facility] [address] [place], [place] [address] Phone # [phone] Fax [phone]

## 2020-11-20 NOTE — Therapy (Signed)
[facility] [address], [place], [place], [address] Phone: [phone]   Fax:  [phone]  Physical Therapy Treatment  Patient Details  Name: [name] MRN: [id number] Date of Birth: 1956-01-09 Referring Provider (PT): Dr. Dian Queen   Encounter Date: 11/20/2020   PT End of Session - 11/20/20 0935     Visit Number 2    Date for PT Re-Evaluation 01/29/21    Authorization Type [employer]    Authorization - Visit Number 2    Authorization - Number of Visits 10    PT Start Time 0845    PT Stop Time 0925    PT Time Calculation (min) 40 min    Activity Tolerance Patient tolerated treatment well;No increased pain    Behavior During Therapy [facility] for tasks assessed/performed             Past Medical History:  Diagnosis Date   Anxiety    Arthritis    Bulging of cervical intervertebral disc    Dental crowns present    Depression    Fibromyalgia    GERD (gastroesophageal reflux disease)    History of chemotherapy 2017   Docetaxel, Carboplatin, Herceptin   History of left breast cancer 01/2016   Multiple thyroid nodules    Palpitations     Past Surgical History:  Procedure Laterality Date   ABDOMINAL HYSTERECTOMY  04/2017   complete   COLONOSCOPY N/A 03/09/2017   Procedure: COLONOSCOPY;  Surgeon: Danie Binder, MD;  [facility];  Service: Endoscopy;  Laterality: N/A;  2:45pm   LAPAROSCOPIC APPENDECTOMY N/A 11/10/2012   Procedure: APPENDECTOMY LAPAROSCOPIC;  Surgeon: Jamesetta So, MD;  [facility]: [facility];  Service: General;  Laterality: N/A;   MASTECTOMY Left 01/07/2016   breast cancer   MASTECTOMY W/ SENTINEL NODE BIOPSY Left 01/07/2016   Procedure: LEFT TOTAL MASTECTOMY WITH LEFT AXILLARY SENTINEL LYMPH NODE BIOPSY;  Surgeon: Donnie Mesa, MD;  Location: [place];  Service: General;  Laterality: Left;   PORT-A-CATH REMOVAL N/A 09/07/2017   Procedure: REMOVAL PORT-A-CATH;   Surgeon: Donnie Mesa, MD;  Location: [place];  Service: General;  Laterality: N/A;   PORTACATH PLACEMENT Right 01/07/2016   Procedure: INSERTION PORT-A-CATH RIGHT INTERNAL JUGULAR WITH ULTRASOUND;  Surgeon: Donnie Mesa, MD;  Location: [place];  Service: General;  Laterality: Right;    There were no vitals filed for this visit.   Subjective Assessment - 11/20/20 0851     Subjective I feel the same.   Rectal pressure is not as bad.    Patient Stated Goals to strengthen her pelvic floor.    Currently in Pain? Yes    Pain Score 7     Pain Location Groin    Pain Orientation Right;Left;Lower    Pain Descriptors / Indicators Discomfort    Pain Type Chronic pain    Pain Onset More than a month ago    Pain Frequency Intermittent    Aggravating Factors  when she is active    Pain Relieving Factors just lets it pass    Multiple Pain Sites No                               [facility] Adult PT Treatment/Exercise - 11/20/20 0001       Self-Care   Self-Care Other Self-Care Comments    Other Self-Care Comments  discussed her using the Vitamin E  cream, placing it up into the vaginal canal and around the vulvar area. The cream is helping.      Lumbar Exercises: Stretches   Active Hamstring Stretch Right;Left;1 rep;30 seconds    Active Hamstring Stretch Limitations supine    Single Knee to Chest Stretch Right;Left;1 rep;30 seconds    Single Knee to Chest Stretch Limitations supine    Lower Trunk Rotation 2 reps;30 seconds   right, left   Lower Trunk Rotation Limitations supine    Piriformis Stretch Right;Left;1 rep;30 seconds    Piriformis Stretch Limitations supine pushing knee down      Manual Therapy   Manual Therapy Soft tissue mobilization;Myofascial release    Manual therapy comments educated patient on scar massage and abdominal massage to promote peristalic motion of the intestines.    Soft tissue mobilization manual mobilization to the diaphragm,  along the obliques; scar mobilization    Myofascial Release using the suction cup on the abdomen and along the scar suprapubically; fascial release along the the upper quadrant to release the fascia; release around the umbilicus, tissue rolling alon gthe abdomen                    PT Education - 11/20/20 0934     Education Details education on abdominal massage and reviewed vaginal moisturizers;Access Code: [email or web address]    Methods Explanation;Handout    Comprehension Returned demonstration;Verbalized understanding              PT Short Term Goals - 11/20/20 0942       PT SHORT TERM GOAL #2   Title understand vaginal moisturizers and how they can help with the health of the tissue    Time 4    Period Weeks    Status Achieved               PT Long Term Goals - 11/06/20 2205       PT LONG TERM GOAL #1   Title independent with advanced HEP    Time 12    Period Weeks    Status New    Target Date 01/29/21      PT LONG TERM GOAL #2   Title able to have vaginally penetrative intercourse with minimal pain due to improve moisture of the vaginal canal and reduction of pelvic floor trigger points    Time 12    Period Weeks    Status New    Target Date 01/29/21      PT LONG TERM GOAL #3   Title ability to fully empty her bladder due to improve pelvic floor coordination and relaxation    Time 12    Period Weeks    Status New    Target Date 01/29/21      PT LONG TERM GOAL #4   Title understand ways to manage prolapse and improve pressure management    Time 12    Period Weeks    Status New    Target Date 01/29/21      PT LONG TERM GOAL #5   Title decreased pelvic pressure >/= 75% due to improved strength >/= 3/5    Time 12    Period Weeks    Status New    Target Date 01/29/21                   Plan - 11/20/20 0935     Clinical Impression Statement Patient has increased fascial tightness throughout the abdomen. She was instructed on  how to  perform scar massage and abdominal massage. Patient had increased tissue mobiltiy after manual work. Patient reports she has less rectal pressure. Patient continues to take Miralax. Patient is using the vitamin E for the vaginal moisture and it is helping. Patient will beneift from skilled therapy to improve vaginal health, reduce muscle tone and trigger points and improve coordination    Personal Factors and Comorbidities Fitness;Past/Current Experience;Time since onset of injury/illness/exacerbation;Sex;Comorbidity 3+    Comorbidities Breast cancer 2x with left masectomy, chemotherapy and radiation; hysterectomy; Fibromyalgia; Abdominal Hysterectomy 11/10/2012; left masectomy 01/07/2016    Examination-Activity Limitations Squat;Stairs;Stand;Toileting;Continence;Transfers    Examination-Participation Restrictions Interpersonal Relationship;Laundry;Yard Work;Community Activity    Stability/Clinical Decision Making Evolving/Moderate complexity    Rehab Potential Good    PT Frequency 1x / week    PT Duration 12 weeks    PT Treatment/Interventions ADLs/Self Care Home Management;Biofeedback;Electrical Stimulation;Therapeutic activities;Therapeutic exercise;Neuromuscular re-education;Patient/family education;Manual techniques;Scar mobilization;Dry needling;Spinal Manipulations;Joint Manipulations    PT Next Visit Plan scar massage for suprapubically; diaphragmatic breathing; education on prolapse and laying down during the day to reduce the prolapse    PT Home Exercise Plan Access Code: [email or web address]    Recommended Other Services MD signed initial eval    Consulted and Agree with Plan of Care Patient             Patient will benefit from skilled therapeutic intervention in order to improve the following deficits and impairments:  Decreased endurance, Decreased scar mobility, Pain, Increased fascial restricitons, Decreased strength, Decreased activity tolerance, Increased muscle spasms, Decreased range of  motion, Decreased coordination  Visit Diagnosis: Muscle weakness (generalized)  Cramp and spasm  Other lack of coordination     Problem List Patient Active Problem List   Diagnosis Date Noted   Constipation 02/18/2017   Heme + stool 02/18/2017   Family history of breast cancer    Family history of colon cancer    Breast cancer of upper-outer quadrant of left female breast ([place]) 01/07/2016   Sprain of foot, left 01/15/2015   Sprain of foot 01/15/2015   Sprained ankle 01/15/2015   Mild mitral insufficiency 06/13/2013   Palpitations 06/13/2013   IMPINGEMENT SYNDROME 07/03/2009    Earlie Counts, PT 11/20/20 11:50 AM  [facility] [address] [place], [place], [address] Phone: [phone]   Fax:  [phone]  Name: CIELA MAHAJAN MRN: [id number] Date of Birth: [birth year]

## 2020-12-02 DIAGNOSIS — R1011 Right upper quadrant pain: Secondary | ICD-10-CM | POA: Diagnosis not present

## 2020-12-02 DIAGNOSIS — Z6826 Body mass index (BMI) 26.0-26.9, adult: Secondary | ICD-10-CM | POA: Diagnosis not present

## 2020-12-02 DIAGNOSIS — E663 Overweight: Secondary | ICD-10-CM | POA: Diagnosis not present

## 2020-12-02 DIAGNOSIS — C50919 Malignant neoplasm of unspecified site of unspecified female breast: Secondary | ICD-10-CM | POA: Diagnosis not present

## 2020-12-03 ENCOUNTER — Other Ambulatory Visit (HOSPITAL_COMMUNITY): Payer: Self-pay | Admitting: Family Medicine

## 2020-12-03 ENCOUNTER — Other Ambulatory Visit: Payer: Self-pay | Admitting: Family Medicine

## 2020-12-03 DIAGNOSIS — R1011 Right upper quadrant pain: Secondary | ICD-10-CM

## 2020-12-04 ENCOUNTER — Encounter: Payer: Self-pay | Admitting: Physical Therapy

## 2020-12-04 ENCOUNTER — Other Ambulatory Visit: Payer: Self-pay

## 2020-12-04 ENCOUNTER — Ambulatory Visit: Payer: PPO | Attending: Obstetrics and Gynecology | Admitting: Physical Therapy

## 2020-12-04 DIAGNOSIS — R278 Other lack of coordination: Secondary | ICD-10-CM | POA: Diagnosis not present

## 2020-12-04 DIAGNOSIS — M6281 Muscle weakness (generalized): Secondary | ICD-10-CM | POA: Diagnosis not present

## 2020-12-04 DIAGNOSIS — R252 Cramp and spasm: Secondary | ICD-10-CM | POA: Diagnosis not present

## 2020-12-04 NOTE — Therapy (Signed)
[facility] [address], [place], [place], [address] Phone: [phone]   Fax:  [phone]  Physical Therapy Treatment  Patient Details  Name: [name] MRN: [id number] Date of Birth: June 29, 1955 Referring Provider (PT): Dr. Dian Queen   Encounter Date: 12/04/2020   PT End of Session - 12/04/20 0910     Visit Number 3    Date for PT Re-Evaluation 01/29/21    Authorization Type [employer]    Authorization - Visit Number 3    Authorization - Number of Visits 10    PT Start Time 0845    PT Stop Time 0925    PT Time Calculation (min) 40 min    Activity Tolerance Patient tolerated treatment well;No increased pain    Behavior During Therapy [facility] for tasks assessed/performed             Past Medical History:  Diagnosis Date   Anxiety    Arthritis    Bulging of cervical intervertebral disc    Dental crowns present    Depression    Fibromyalgia    GERD (gastroesophageal reflux disease)    History of chemotherapy 2017   Docetaxel, Carboplatin, Herceptin   History of left breast cancer 01/2016   Multiple thyroid nodules    Palpitations     Past Surgical History:  Procedure Laterality Date   ABDOMINAL HYSTERECTOMY  04/2017   complete   COLONOSCOPY N/A 03/09/2017   Procedure: COLONOSCOPY;  Surgeon: Danie Binder, MD;  [facility];  Service: Endoscopy;  Laterality: N/A;  2:45pm   LAPAROSCOPIC APPENDECTOMY N/A 11/10/2012   Procedure: APPENDECTOMY LAPAROSCOPIC;  Surgeon: Jamesetta So, MD;  [facility]: [facility];  Service: General;  Laterality: N/A;   MASTECTOMY Left 01/07/2016   breast cancer   MASTECTOMY W/ SENTINEL NODE BIOPSY Left 01/07/2016   Procedure: LEFT TOTAL MASTECTOMY WITH LEFT AXILLARY SENTINEL LYMPH NODE BIOPSY;  Surgeon: Donnie Mesa, MD;  Location: [place];  Service: General;  Laterality: Left;   PORT-A-CATH REMOVAL N/A 09/07/2017   Procedure: REMOVAL PORT-A-CATH;   Surgeon: Donnie Mesa, MD;  Location: [place];  Service: General;  Laterality: N/A;   PORTACATH PLACEMENT Right 01/07/2016   Procedure: INSERTION PORT-A-CATH RIGHT INTERNAL JUGULAR WITH ULTRASOUND;  Surgeon: Donnie Mesa, MD;  Location: [place];  Service: General;  Laterality: Right;    There were no vitals filed for this visit.   Subjective Assessment - 12/04/20 0850     Subjective I have had issues in my abdomen and it could be my gall bladder. No groin pian just when I ma doing the scar massage.    Patient Stated Goals to strengthen her pelvic floor.    Currently in Pain? No/denies                            Pelvic Floor Special Questions - 12/04/20 0001     Pelvic Floor Internal Exam Patient confirms identification and approves PT to assess pelvic floor and treatment    Exam Type Vaginal    Strength weak squeeze, no lift               [facility] Adult PT Treatment/Exercise - 12/04/20 0001       Neuro Re-ed    Neuro Re-ed Details  diaphragmatic breathing with air going into the abdomen and useing tactile cues to not go into the chest  Lumbar Exercises: Supine   Other Supine Lumbar Exercises supine laying iwth legs on leg rest and pillow under the hips with pelvic floor contraction holding for 5 seconds 10x and using the position to educated on reducing her prolapse      Manual Therapy   Manual Therapy Soft tissue mobilization;Internal Pelvic Floor    Soft tissue mobilization gentle manual work to the perineal body whiel monitoring with pain    Internal Pelvic Floor manual work to the posterior wall of the introitus to improve the tissue mobiltiy                    PT Education - 12/04/20 0927     Education Details Access Code: [email or web address]; information on management of prolapse    Person(s) Educated Patient    Methods Explanation;Demonstration;Verbal cues;Handout    Comprehension Returned demonstration;Verbalized understanding               PT Short Term Goals - 12/04/20 [id number]       PT SHORT TERM GOAL #1   Title independent with initial HEP for stretches and diaphragmatic breathing    Time 4    Period Weeks    Status Achieved      PT SHORT TERM GOAL #2   Title understand vaginal moisturizers and how they can help with the health of the tissue    Time 4    Period Weeks    Status Achieved      PT SHORT TERM GOAL #3   Title able to have the therapist work on the vaginal tissue without increased pain    Time 4    Period Weeks    Status On-going               PT Long Term Goals - 12/04/20 [id number]       PT LONG TERM GOAL #3   Title ability to fully empty her bladder due to improve pelvic floor coordination and relaxation    Baseline empty bladder is 90%  easier    Time 12    Period Weeks    Status Achieved      PT LONG TERM GOAL #4   Title understand ways to manage prolapse and improve pressure management    Time 12    Period Weeks    Status On-going      PT LONG TERM GOAL #5   Title decreased pelvic pressure >/= 75% due to improved strength >/= 3/5    Time 12    Period Weeks    Status On-going                   Plan - 12/04/20 0930     Clinical Impression Statement Pelvic floor strength is now 2/5 instead of 1/5. She has increased tissue mobiltiy of the perineal body and posterior introitus after the manual work. Patient is not having groin pain. She is able to empty her bladder with 90% greater ease. Patient is having abdominal issues and the doctors are doing further tests. She understands how to reduce  her prolpase with laying on her back pand raising her hips. Patient will benefit from skilled therapy to improve vaginal health, reduce muscle tone and trigger poitns and improve coordination.    Personal Factors and Comorbidities Fitness;Past/Current Experience;Time since onset of injury/illness/exacerbation;Sex;Comorbidity 3+    Comorbidities Breast cancer 2x with left  masectomy, chemotherapy and radiation; hysterectomy; Fibromyalgia; Abdominal Hysterectomy 11/10/2012; left masectomy 01/07/2016  Examination-Activity Limitations Squat;Stairs;Stand;Toileting;Continence;Transfers    Examination-Participation Restrictions Interpersonal Relationship;Laundry;Yard Work;Community Activity    Stability/Clinical Decision Making Evolving/Moderate complexity    Rehab Potential Good    PT Frequency 1x / week    PT Duration 12 weeks    PT Treatment/Interventions ADLs/Self Care Home Management;Biofeedback;Electrical Stimulation;Therapeutic activities;Therapeutic exercise;Neuromuscular re-education;Patient/family education;Manual techniques;Scar mobilization;Dry needling;Spinal Manipulations;Joint Manipulations    PT Next Visit Plan manual work to the post. introitus and perineal body, see how the tests on the abdomen is doing, breath with lifting    PT Home Exercise Plan Access Code: [email or web address]    Consulted and Agree with Plan of Care Patient             Patient will benefit from skilled therapeutic intervention in order to improve the following deficits and impairments:  Decreased endurance, Decreased scar mobility, Pain, Increased fascial restricitons, Decreased strength, Decreased activity tolerance, Increased muscle spasms, Decreased range of motion, Decreased coordination  Visit Diagnosis: Muscle weakness (generalized)  Cramp and spasm  Other lack of coordination     Problem List Patient Active Problem List   Diagnosis Date Noted   Constipation 02/18/2017   Heme + stool 02/18/2017   Family history of breast cancer    Family history of colon cancer    Breast cancer of upper-outer quadrant of left female breast ([place]) 01/07/2016   Sprain of foot, left 01/15/2015   Sprain of foot 01/15/2015   Sprained ankle 01/15/2015   Mild mitral insufficiency 06/13/2013   Palpitations 06/13/2013   IMPINGEMENT SYNDROME 07/03/2009    Earlie Counts, PT 12/04/20 9:34  AM  [facility] [address] [place], [place], [address] Phone: [phone]   Fax:  [phone]  Name: [name] MRN: [id number] Date of Birth: 05-Feb-1956

## 2020-12-04 NOTE — Patient Instructions (Addendum)
About Pelvic Support Problems Pelvic Support Problems Explained Ligaments, muscles, and connective tissue normally hold your bladder, uterus, and other organs in their proper places in your pelvis. When these tissues become weak, a problem with pelvic support may result. Weak support can cause one or more of the pelvic organs to drop down into the vagina. An organ may even drop so far that is partially exposed outside the body.  Pelvic support problems are named by the change in the organ. The main types of pelvic support problems are:  Cystocele: When the bladder drops down into your vagina.  Enterocele: When your small intestine drops between your vagina and rectum.  Rectocele: When your rectum bulges into the vaginal wall.  Uterine prolapse: When your uterus drops into your vagina.  Vaginal prolapse: When the top part of the vagina begins to droop. This sometimes happens after a hysterectomy (removal of the uterus).  Causes Pelvic support problems can be caused by many conditions. They may begin after you give birth, especially if you had a large baby. During childbirth, the muscles and skin of the birth canal (vagina) are stretched and sometimes torn. They heal over time but are not always exactly the same. A long pushing stage of labor may also weaken these tissues as well as very rapid births as the tissues do not have time to stretch so they tear.  Also, after menopause, there are changes in the vaginal walls resulting from a decrease in estrogen. Estrogen helps to keep the tissues toned. Low levels of estrogen weaken the vaginal walls and may cause the bladder to shift from its normal position. As women get older, the loss of muscle tone and the relaxation of muscles may cause the uterus or other organs to drop.  Over time, conditions like chronic coughing, chronic constipation, doing a lot of heavy lifting, straining to pass stool, and obesity, can also weaken the pelvic support muscles.   Diagnosing Pelvic Support Problems Your health care provider will ask about your symptoms and do a pelvic examination. Your provider may also do a rectal exam during your pelvic exam. Your provider may ask you to: 1. Bear down and push (like you are having a bowel movement) so he or she can see if your bladder or other part of your body protrudes into the vagina. 2. Contract the muscles of your pelvis to check the strength of your pelvic muscles.  3. Do several types of urine, nerve and muscle tests of the pelvis and around the bladder to see what type of treatment is best for you.   Symptoms Symptoms of pelvic support problems depend on the organ involved, but may include:  urine leakage  stain or fecal loss after a bowel movement trouble having bowel movements  ache in the lower abdomen, groin, or lower back  bladder infection  a feeling of heaviness, pulling, or fullness in the pelvis, or a feeling that something is falling out of the vagina  an organ protruding from your vaginal opening  feeling the need to support the organs or perineal area to empty bladder or bowels painful sexual intercourse.  Many women feel pelvic pressure or trouble holding their urine immediately after childbirth. For some, these symptoms go away permanently, in others they return as they get older.  Treatment Options A prolapsed organ cannot repair itself. Contact your health care provider as soon as you notice symptoms of a problem. Treatment depends on what the specific problem is and how far  advanced it is.  The symptoms caused by some pelvic support problems may simply be treated with changes in diet, medicine to soften the stool, weight loss, or avoiding strenuous activities. You may also do pelvic floor exercises to help strengthen your pelvic muscles.  Some cases of prolapse may require a special support device made from plastic or rubber called a pessary that fits into the vagina to support the uterus,  vagina, or bladder. A pessary can also help women who leak urine when coughing, straining, or exercising. In mild cases, a tampon or vaginal diaphragm may be used instead of a pessary.  Talk to your doctor or health care provider about these options. In serious cases, surgery may be needed to put the organs back into their proper place. The uterus may be removed because of the pressure it puts on the bladder.  Your doctor will know what surgery will be best for you. How can I prevent pelvic support problems?  You can help prevent pelvic support problems by:  maintaining a healthy lifestyle  continuing to do pelvic floor exercises after you deliver a baby  maintaining a healthy weight  avoiding a lot of heavy lifting and lifting with your legs (not from your waist)  treating constipation and avoid getting    Access Code: [email or web address] URL: [email or web address] Date: 12/04/2020 Prepared by: Earlie Counts  Exercises Supine Single Knee to Chest - 1 x daily - 4 x weekly - 1 sets - 2 reps Supine Hamstring Stretch - 1 x daily - 4 x weekly - 1 sets - 2 reps - 30 sec hold Supine Figure 4 Piriformis Stretch - 1 x daily - 4 x weekly - 1 sets - 2 reps - 30 sec hold Diaphragmatic Breathing at 90/90 Supported - 2 x daily - 7 x weekly - 1 sets - 10 reps Supine Pelvic Floor Contraction - 1 x daily - 7 x weekly - 1 sets - 10 reps - 5 sec hold  [facility] [address] [place], [place] [address] Phone # [phone] Fax [phone]

## 2020-12-06 ENCOUNTER — Ambulatory Visit (HOSPITAL_COMMUNITY)
Admission: RE | Admit: 2020-12-06 | Discharge: 2020-12-06 | Disposition: A | Discharge status: Home / Self Care | Payer: PPO | Source: Ambulatory Visit | Attending: Family Medicine | Admitting: Family Medicine

## 2020-12-06 ENCOUNTER — Other Ambulatory Visit: Payer: Self-pay

## 2020-12-06 DIAGNOSIS — K7689 Other specified diseases of liver: Secondary | ICD-10-CM | POA: Diagnosis not present

## 2020-12-06 DIAGNOSIS — R1011 Right upper quadrant pain: Secondary | ICD-10-CM | POA: Diagnosis not present

## 2020-12-11 ENCOUNTER — Encounter: Payer: Self-pay | Admitting: Physical Therapy

## 2020-12-11 ENCOUNTER — Other Ambulatory Visit: Payer: Self-pay

## 2020-12-11 ENCOUNTER — Ambulatory Visit: Payer: PPO | Admitting: Physical Therapy

## 2020-12-11 DIAGNOSIS — R252 Cramp and spasm: Secondary | ICD-10-CM

## 2020-12-11 DIAGNOSIS — M6281 Muscle weakness (generalized): Secondary | ICD-10-CM

## 2020-12-11 DIAGNOSIS — R278 Other lack of coordination: Secondary | ICD-10-CM

## 2020-12-11 NOTE — Therapy (Signed)
[facility] [address], [place], [place], [address] Phone: [phone]   Fax:  [phone]  Physical Therapy Treatment  Patient Details  Name: NAYVEE GINDER MRN: [id number] Date of Birth: 03/14/1956 Referring Provider (PT): Dr. Dian Queen   Encounter Date: 12/11/2020   PT End of Session - 12/11/20 0856     Visit Number 4    Date for PT Re-Evaluation 01/29/21    Authorization Type [employer]    Authorization - Visit Number 4    Authorization - Number of Visits 10    PT Start Time 0845    PT Stop Time 0925    PT Time Calculation (min) 40 min    Activity Tolerance Patient tolerated treatment well;No increased pain    Behavior During Therapy [facility] for tasks assessed/performed             Past Medical History:  Diagnosis Date   Anxiety    Arthritis    Bulging of cervical intervertebral disc    Dental crowns present    Depression    Fibromyalgia    GERD (gastroesophageal reflux disease)    History of chemotherapy 2017   Docetaxel, Carboplatin, Herceptin   History of left breast cancer 01/2016   Multiple thyroid nodules    Palpitations     Past Surgical History:  Procedure Laterality Date   ABDOMINAL HYSTERECTOMY  04/2017   complete   COLONOSCOPY N/A 03/09/2017   Procedure: COLONOSCOPY;  Surgeon: Danie Binder, MD;  [facility];  Service: Endoscopy;  Laterality: N/A;  2:45pm   LAPAROSCOPIC APPENDECTOMY N/A 11/10/2012   Procedure: APPENDECTOMY LAPAROSCOPIC;  Surgeon: Jamesetta So, MD;  [facility]: [facility];  Service: General;  Laterality: N/A;   MASTECTOMY Left 01/07/2016   breast cancer   MASTECTOMY W/ SENTINEL NODE BIOPSY Left 01/07/2016   Procedure: LEFT TOTAL MASTECTOMY WITH LEFT AXILLARY SENTINEL LYMPH NODE BIOPSY;  Surgeon: Donnie Mesa, MD;  Location: [place];  Service: General;  Laterality: Left;   PORT-A-CATH REMOVAL N/A 09/07/2017   Procedure: REMOVAL PORT-A-CATH;   Surgeon: Donnie Mesa, MD;  Location: [place];  Service: General;  Laterality: N/A;   PORTACATH PLACEMENT Right 01/07/2016   Procedure: INSERTION PORT-A-CATH RIGHT INTERNAL JUGULAR WITH ULTRASOUND;  Surgeon: Donnie Mesa, MD;  Location: [place];  Service: General;  Laterality: Right;    There were no vitals filed for this visit.   Subjective Assessment - 12/11/20 0846     Subjective The ultrasound shows a fatty liver. The pain is not as bad. I have pressure in the rectum. The rectal pressure comes and goes and worse after a meal. type 4 that and sidth of the index finer that is 4 inches long.    Patient Stated Goals to strengthen her pelvic floor.    Currently in Pain? Yes    Pain Score 9     Pain Location Rectum    Pain Orientation Mid    Pain Descriptors / Indicators Pressure    Pain Type Chronic pain    Pain Onset More than a month ago    Pain Frequency Intermittent    Aggravating Factors  after eating a meal    Pain Relieving Factors after a bowel movement    Multiple Pain Sites No                            Pelvic Floor Special Questions -  12/11/20 0001     Pelvic Floor Internal Exam Patient confirms identification and approves PT to assess pelvic floor and treatment    Exam Type Rectal;Vaginal    Palpation tenderness located throughout the pelvic floor muscles    Strength weak squeeze, no lift   rectally is 3/5 and able to bulge the therapist finger out              [facility] Adult PT Treatment/Exercise - 12/11/20 0001       Neuro Re-ed    Neuro Re-ed Details  working on pelvic floor contraction anally and vaginally with tactile cues. She is able to contract the anus at strength 3/5 and push the therapist finger out; vaginally able to contract more posteriorly than anteriorly      Manual Therapy   Manual Therapy Internal Pelvic Floor    Internal Pelvic Floor one finger in the anal canal working on the puborectalis, obturator  internist, anal sphincter; one finger in the rectum and other vaginally working on the area between the vaginal and anal                      PT Short Term Goals - 12/11/20 0930       PT SHORT TERM GOAL #3   Title able to have the therapist work on the vaginal tissue without increased pain    Time 4    Period Weeks    Status Achieved               PT Long Term Goals - 12/11/20 0930       PT LONG TERM GOAL #1   Title independent with advanced HEP    Time 12    Period Weeks    Status On-going      PT LONG TERM GOAL #2   Title able to have vaginally penetrative intercourse with minimal pain due to improve moisture of the vaginal canal and reduction of pelvic floor trigger points    Time 12    Period Weeks    Status On-going      PT LONG TERM GOAL #3   Title ability to fully empty her bladder due to improve pelvic floor coordination and relaxation    Baseline empty bladder is 90%  easier    Time 12    Period Weeks    Status Achieved      PT LONG TERM GOAL #4   Title understand ways to manage prolapse and improve pressure management    Time 12    Period Weeks    Status On-going      PT LONG TERM GOAL #5   Title decreased pelvic pressure >/= 75% due to improved strength >/= 3/5    Time 12    Period Weeks    Status On-going                   Plan - 12/11/20 [id number]     Clinical Impression Statement Patient is having difficutly with pressure in the pelvic floor. After the manual work she did not have the rectal pressure. She is having Type 4 stool but then are width of index finger. She is able to contract the rectum at 3/5 and push the therapist index finger out of the canal. Vaginally is 2/5 and more contractiong posteriorly than anteriorly. Patient has many trigger points in the pelvic floor. Patient will benefit from skilled therapy to improve vaginal health, reduce muscle tone and  trigger points and improve coordination.    Personal Factors  and Comorbidities Fitness;Past/Current Experience;Time since onset of injury/illness/exacerbation;Sex;Comorbidity 3+    Comorbidities Breast cancer 2x with left masectomy, chemotherapy and radiation; hysterectomy; Fibromyalgia; Abdominal Hysterectomy 11/10/2012; left masectomy 01/07/2016    Examination-Activity Limitations Squat;Stairs;Stand;Toileting;Continence;Transfers    Examination-Participation Restrictions Interpersonal Relationship;Laundry;Yard Work;Community Activity    Rehab Potential Good    PT Frequency 1x / week    PT Duration 12 weeks    PT Treatment/Interventions ADLs/Self Care Home Management;Biofeedback;Electrical Stimulation;Therapeutic activities;Therapeutic exercise;Neuromuscular re-education;Patient/family education;Manual techniques;Scar mobilization;Dry needling;Spinal Manipulations;Joint Manipulations    PT Next Visit Plan manual work to the pelvic floor; toileting technique; ask about the pressure; work on pelvic floor contraction to reduce pressure anally    PT Home Exercise Plan Access Code: [email or web address]    Consulted and Agree with Plan of Care Patient             Patient will benefit from skilled therapeutic intervention in order to improve the following deficits and impairments:  Decreased endurance, Decreased scar mobility, Pain, Increased fascial restricitons, Decreased strength, Decreased activity tolerance, Increased muscle spasms, Decreased range of motion, Decreased coordination  Visit Diagnosis: Muscle weakness (generalized)  Cramp and spasm  Other lack of coordination     Problem List Patient Active Problem List   Diagnosis Date Noted   Constipation 02/18/2017   Heme + stool 02/18/2017   Family history of breast cancer    Family history of colon cancer    Breast cancer of upper-outer quadrant of left female breast ([place]) 01/07/2016   Sprain of foot, left 01/15/2015   Sprain of foot 01/15/2015   Sprained ankle 01/15/2015   Mild mitral insufficiency  06/13/2013   Palpitations 06/13/2013   IMPINGEMENT SYNDROME 07/03/2009    Earlie Counts, PT 12/11/20 9:32 AM  [facility] [address], [place], [place], [address] Phone: [phone]   Fax:  [phone]  Name: TORII RAKESTRAW MRN: [id number] Date of Birth: [birth year]

## 2020-12-19 ENCOUNTER — Encounter: Payer: PPO | Admitting: Physical Therapy

## 2020-12-19 DIAGNOSIS — Z6826 Body mass index (BMI) 26.0-26.9, adult: Secondary | ICD-10-CM | POA: Diagnosis not present

## 2020-12-19 DIAGNOSIS — Z853 Personal history of malignant neoplasm of breast: Secondary | ICD-10-CM | POA: Diagnosis not present

## 2020-12-19 DIAGNOSIS — Z124 Encounter for screening for malignant neoplasm of cervix: Secondary | ICD-10-CM | POA: Diagnosis not present

## 2020-12-19 DIAGNOSIS — Z779 Other contact with and (suspected) exposures hazardous to health: Secondary | ICD-10-CM | POA: Diagnosis not present

## 2020-12-23 ENCOUNTER — Ambulatory Visit: Payer: PPO | Admitting: Physical Therapy

## 2020-12-23 ENCOUNTER — Encounter: Payer: Self-pay | Admitting: Physical Therapy

## 2020-12-23 ENCOUNTER — Other Ambulatory Visit: Payer: Self-pay

## 2020-12-23 DIAGNOSIS — M6281 Muscle weakness (generalized): Secondary | ICD-10-CM

## 2020-12-23 DIAGNOSIS — R252 Cramp and spasm: Secondary | ICD-10-CM

## 2020-12-23 DIAGNOSIS — R278 Other lack of coordination: Secondary | ICD-10-CM

## 2020-12-23 NOTE — Therapy (Signed)
[facility] [address] [place], [place], [address] Phone: [phone]   Fax:  [phone]  Physical Therapy Treatment  Patient Details  Name: [name] MRN: [id number] Date of Birth: 03-Feb-1956 Referring Provider (PT): Dr. Dian Queen   Encounter Date: 12/23/2020   PT End of Session - 12/23/20 1440     Visit Number 5    Date for PT Re-Evaluation 01/29/21    Authorization Type [employer]    Authorization - Visit Number 5    Authorization - Number of Visits 10    PT Start Time 1400    PT Stop Time 1440    PT Time Calculation (min) 40 min    Activity Tolerance Patient tolerated treatment well;No increased pain    Behavior During Therapy [facility] for tasks assessed/performed             Past Medical History:  Diagnosis Date   Anxiety    Arthritis    Bulging of cervical intervertebral disc    Dental crowns present    Depression    Fibromyalgia    GERD (gastroesophageal reflux disease)    History of chemotherapy 2017   Docetaxel, Carboplatin, Herceptin   History of left breast cancer 01/2016   Multiple thyroid nodules    Palpitations     Past Surgical History:  Procedure Laterality Date   ABDOMINAL HYSTERECTOMY  04/2017   complete   COLONOSCOPY N/A 03/09/2017   Procedure: COLONOSCOPY;  Surgeon: Danie Binder, MD;  [facility];  Service: Endoscopy;  Laterality: N/A;  2:45pm   LAPAROSCOPIC APPENDECTOMY N/A 11/10/2012   Procedure: APPENDECTOMY LAPAROSCOPIC;  Surgeon: Jamesetta So, MD;  [facility];  Service: General;  Laterality: N/A;   MASTECTOMY Left 01/07/2016   breast cancer   MASTECTOMY W/ SENTINEL NODE BIOPSY Left 01/07/2016   Procedure: LEFT TOTAL MASTECTOMY WITH LEFT AXILLARY SENTINEL LYMPH NODE BIOPSY;  Surgeon: Donnie Mesa, MD;  Location: [place];  Service: General;  Laterality: Left;   PORT-A-CATH REMOVAL N/A 09/07/2017   Procedure: REMOVAL PORT-A-CATH;   Surgeon: Donnie Mesa, MD;  Location: [facility];  Service: General;  Laterality: N/A;   PORTACATH PLACEMENT Right 01/07/2016   Procedure: INSERTION PORT-A-CATH RIGHT INTERNAL JUGULAR WITH ULTRASOUND;  Surgeon: Donnie Mesa, MD;  Location: [place];  Service: General;  Laterality: Right;    There were no vitals filed for this visit.   Subjective Assessment - 12/23/20 1402     Subjective I am doing aright. The rectal pressure is better. Pressure is 90% better and not as intense.    Patient Stated Goals to strengthen her pelvic floor.    Currently in Pain? Yes    Pain Score 2     Pain Location Rectum    Pain Orientation Mid    Pain Descriptors / Indicators Pressure    Pain Onset More than a month ago    Pain Frequency Intermittent    Aggravating Factors  after eating a meal    Pain Relieving Factors after a bowel movement    Multiple Pain Sites No                            Pelvic Floor Special Questions - 12/23/20 0001     Pelvic Floor Internal Exam Patient confirms identification and approves PT to assess pelvic floor and treatment    Exam Type Rectal    Palpation decreased  movement of the coccyx, trigger points in the puborectalis and right anteiror pubic bone   after treatment increased movement of the coccyx bone   Strength fair squeeze, definite lift               [facility] Adult PT Treatment/Exercise - 12/23/20 0001       Therapeutic Activites    Therapeutic Activities Other Therapeutic Activities    Other Therapeutic Activities PT educated patient on how to toilet correctly to relax the anus, breath into the abdomen and when breathing out feel slight contraction of the lower abdomen      Lumbar Exercises: Supine   Bent Knee Raise 20 reps;1 second    Bent Knee Raise Limitations with many tactile and verbal cues to contract the lower abdominals and not raise the chest up to hinge at the thoracic    Isometric Hip Flexion Limitations  attemptted on the left but hurt the hip      Manual Therapy   Manual Therapy Internal Pelvic Floor    Internal Pelvic Floor manual work to the puborectalis muscle, coccygeus and anococcygeal ligament; mobilize the coccyx bone with one finger internally and one externally                    PT Education - 12/23/20 1440     Education Details Access Code: [email or web address]  ; educated patient on correct toileting    Person(s) Educated Patient    Methods Explanation;Demonstration;Verbal cues;Handout    Comprehension Returned demonstration;Verbalized understanding              PT Short Term Goals - 12/23/20 1445       PT SHORT TERM GOAL #1   Title independent with initial HEP for stretches and diaphragmatic breathing    Period Weeks    Status Achieved               PT Long Term Goals - 12/23/20 1445       PT LONG TERM GOAL #1   Title independent with advanced HEP    Time 12    Period Weeks    Status On-going      PT LONG TERM GOAL #2   Title able to have vaginally penetrative intercourse with minimal pain due to improve moisture of the vaginal canal and reduction of pelvic floor trigger points    Time 12    Period Weeks    Status On-going      PT LONG TERM GOAL #3   Title ability to fully empty her bladder due to improve pelvic floor coordination and relaxation    Baseline empty bladder is 90%  easier    Time 12    Period Weeks      PT LONG TERM GOAL #4   Title understand ways to manage prolapse and improve pressure management    Time 12    Period Weeks    Status On-going      PT LONG TERM GOAL #5   Title decreased pelvic pressure >/= 75% due to improved strength >/= 3/5    Time 12    Period Weeks    Status Achieved                   Plan - 12/23/20 1441     Clinical Impression Statement Patient reports her rectal pressure is 90% better and not as intense. The rectal pressure is 2/10 compared to 10/10. Patient pelvic floor strength  increased to  3/5 anally. She had trigger points in the puborectalis, right coccygeus, and anterior of the right pubic bone. Patient had increased coccyx mobility after the manual work. Patient was able to do diaphragmatic breathing and relax the pelvic floor.She was able to push the therapist finger out of the anal canal to reproduce toileting. Patient will benefit from skilled therapy to improve vaginal health, reduce trigger points and improve coordination.    Personal Factors and Comorbidities Fitness;Past/Current Experience;Time since onset of injury/illness/exacerbation;Sex;Comorbidity 3+    Comorbidities Breast cancer 2x with left masectomy, chemotherapy and radiation; hysterectomy; Fibromyalgia; Abdominal Hysterectomy 11/10/2012; left masectomy 01/07/2016    Examination-Activity Limitations Squat;Stairs;Stand;Toileting;Continence;Transfers    Examination-Participation Restrictions Interpersonal Relationship;Laundry;Yard Work;Community Activity    Stability/Clinical Decision Making Evolving/Moderate complexity    Rehab Potential Good    PT Frequency 1x / week    PT Duration 12 weeks    PT Treatment/Interventions ADLs/Self Care Home Management;Biofeedback;Electrical Stimulation;Therapeutic activities;Therapeutic exercise;Neuromuscular re-education;Patient/family education;Manual techniques;Scar mobilization;Dry needling;Spinal Manipulations;Joint Manipulations    PT Next Visit Plan pelvic floor strength with arm movement; diagonal in sidely. bridge in supine with alternate leg; ask about intercourse and if we are to keep it a goal.    PT Home Exercise Plan Access Code: [email or web address]    Consulted and Agree with Plan of Care Patient             Patient will benefit from skilled therapeutic intervention in order to improve the following deficits and impairments:  Decreased endurance, Decreased scar mobility, Pain, Increased fascial restricitons, Decreased strength, Decreased activity tolerance,  Increased muscle spasms, Decreased range of motion, Decreased coordination  Visit Diagnosis: Muscle weakness (generalized)  Cramp and spasm  Other lack of coordination     Problem List Patient Active Problem List   Diagnosis Date Noted   Constipation 02/18/2017   Heme + stool 02/18/2017   Family history of breast cancer    Family history of colon cancer    Breast cancer of upper-outer quadrant of left female breast ([place]) 01/07/2016   Sprain of foot, left 01/15/2015   Sprain of foot 01/15/2015   Sprained ankle 01/15/2015   Mild mitral insufficiency 06/13/2013   Palpitations 06/13/2013   IMPINGEMENT SYNDROME 07/03/2009    Earlie Counts, PT 12/23/20 2:48 PM  [facility] [address], [place], [place], [address] Phone: [phone]   Fax:  [phone]  Name: [name] MRN: [id number] Date of Birth: [birth year]

## 2020-12-23 NOTE — Patient Instructions (Addendum)
Toileting Techniques for Bowel Movements    An Evacuation/Defecation Plan   Here are the 4 basic points:  Lean forward enough for your elbows to rest on your knees Support your feet on the floor or use a low stool if your feet don't touch the floor  Push out your belly as if you have swallowed a beach ball--you should feel a widening of your waist. "Belly Big, Belly Hard" Open and relax your pelvic floor muscles, rather than tightening around the anus  While you are sitting on the toilet pay attention to the following areas: Jaw and mouth position- relaxed not clenched Angle of your hips - leaning slightly forward Whether your feet touch the ground or not - should be flat and supported Arm placement - rest against your thighs Spine position - flat back Waist Breathing - exhale as you push (like blowing up a balloon or try using other sounds such as ahhhh, shhhhh, ohhhh or grrrrrrr) Beulah Gandy - hard and tight as you push Anus (opening of the anal canal) - relaxed and open as you push Anus - Tighten and lift pulling the muscle back in after you are done or if taking a break  If you are not successful after 10-15 minutes, try again later.  Avoid negative self-talk about your toileting experience.   Read this for more details and ask your PT if you need suggestions for adjustments or limitations:  Sitting on the toilet  a) Make sure your feet are supported - flat on the floor or step stool b) Many people find it effective to lean forward or raise their knees.  Propping your feet on a step stool (Squatty Potty is a brand name) can help the muscles around the anus to relax  c) When you lean forward, place your forearms on your thighs for support  Relaxing Breathe deeply and slowly in through your nose and out through your mouth. To become aware of how to relax your muscles, contracting and releasing muscles can be helpful.  Pull your pelvic floor muscles in tightly by using the image of  holding back gas, or closing around the anus (visualize making a circle smaller) and lifting the anus up and in.  Then release the muscles and your anus should drop down and feel open. Repeat 5 times ending with the feeling of relaxation. Keep your pelvic floor muscles relaxed; let your belly bulge out. The digestive tract starts at the mouth and ends at the anal opening, so be sure to relax both ends of the tube.  Place your tongue on the roof of your mouth with your teeth separated.  This helps relax your mouth and will help to relax the anus at the same time.  Emptying (defecation) a) Keep your pelvic floor and sphincter relaxed, then bulge your anal muscles.  Make the anal opening wide.  b) Stick your belly out as if you have swallowed a beach ball. c) Make your belly wall hard using your belly muscles while continuing to breathe. Doing this makes it easier to open your anus. d) Breath out and give a grunt (or try using other sounds such as ahhhh, shhhhh, ohhhh or grrrrrrr). e)  Can also try to act as if you are blowing up a balloon as you push  4) Finishing a) As you finish your bowel movement, pull the pelvic floor muscles up and in.  This will leave your anus in the proper place rather than remaining pushed out and down. If  you leave your anus pushed out and down, it will start to feel as though that is normal and give you incorrect signals about needing to have a bowel movement.  Access Code: [email or web address] URL: [email or web address] Date: 12/23/2020 Prepared by: Earlie Counts  Exercises Supine Single Knee to Chest - 1 x daily - 4 x weekly - 1 sets - 2 reps Supine Hamstring Stretch - 1 x daily - 4 x weekly - 1 sets - 2 reps - 30 sec hold Supine Figure 4 Piriformis Stretch - 1 x daily - 4 x weekly - 1 sets - 2 reps - 30 sec hold Diaphragmatic Breathing at 90/90 Supported - 2 x daily - 7 x weekly - 1 sets - 10 reps Supine Pelvic Floor Contraction - 1 x daily - 7 x weekly - 1 sets  - 10 reps - 5 sec hold Supine March - 1 x daily - 7 x weekly - 2 sets - 10 reps  [facility] [address] [place], [place] [address] Phone # [phone] Fax [phone]

## 2021-01-01 ENCOUNTER — Encounter: Payer: Self-pay | Admitting: Physical Therapy

## 2021-01-01 ENCOUNTER — Other Ambulatory Visit: Payer: Self-pay

## 2021-01-01 ENCOUNTER — Ambulatory Visit: Payer: PPO | Admitting: Physical Therapy

## 2021-01-01 DIAGNOSIS — R252 Cramp and spasm: Secondary | ICD-10-CM

## 2021-01-01 DIAGNOSIS — M6281 Muscle weakness (generalized): Secondary | ICD-10-CM | POA: Diagnosis not present

## 2021-01-01 DIAGNOSIS — R278 Other lack of coordination: Secondary | ICD-10-CM

## 2021-01-01 NOTE — Patient Instructions (Addendum)
Types of Fiber  There are two main types of fiber:  insoluble and soluble.  Both of these types can prevent and relieve constipation and diarrhea, although some people find one or the other to be more easily digested.  This handout details information about both types of fiber.  Insoluble Fiber       Functions of Insoluble Fiber moves bulk through the intestines  controls and balances the pH (acidity) in the intestines       Benefits of Insoluble Fiber promotes regular bowel movement and prevents constipation  removes fecal waste through colon in less time  keeps an optimal pH in intestines to prevent microbes from producing cancer substances, therefore preventing colon cancer        Food Sources of Insoluble Fiber whole-wheat products  wheat bran "miller's bran" corn bran  flax seed or other seeds vegetables such as green beans, broccoli, cauliflower and potato skins  fruit skins and root vegetable skins  popcorn brown rice Potatoes cooked navy beans (1/2 cup contains 9.5 g) 100 percent ready-to-eat bran (1/2 cup contains 8.8 g) canned kidney beans (1/2 cup contains 8.2 g) cooked split peas (1/2 cup contains 8.1 g) cooked lentils (1/2 cup contains 7.8 g) cooked pinto/black beans (1/2 cup contains 7.8/7.5 g) cooked artichoke (one whole artichoke contains 6.5 g) cooked white beans/chickpeas/great northern beans (1/2 cup contains 6.3-6.2 g) mature soybeans (1/2 cup cooked contains 5.2 g) plain rye wafers or crackers (2 crackers contain 5.0 g) baked sweet potato with the peel (1 medium potato contains 4.8 g) raw pear or Asian pear (1 small pear contains 4.3-4.4 g) cooked green peas (1/2 cup contains 4.4 g) whole wheat English muffin/bread (1 muffin or 2 slices contains 4.4 g) cooked bulgur wheat (1/2 cup contains 4.1 g) raw raspberries (1/2 cup contains 4.0 g) boiled sweet potato without the peel (1 medium potato contains 3.9 g) baked potato with the peel (1 medium potato  contains 3.8 g) stewed prunes (1/2 cup contains 3.8 g) dried figs or dates (1/2 cup contains 3.7-3.8 g) raw oat bran (1/2 cup contains 3.6 g) canned pumpkin (1/2 cup contains 3.6 g) cooked spinach (1/2 cup contains 3.5 g) shredded ready-to-eat wheat cereals (1 ounce contains 2.8-3.4 g) raw almonds (1 oz. contains 3.3 g) raw apple with the skin (1 medium apple includes 3.3 g) cooked whole wheat spaghetti (1/2 cup contains 3.1 g) raw banana or orange (1 fruit contains 3.1   Smooth move tea Magnesium citrate- supplement Calm with magnesium citrate  Access Code: [email or web address] URL: [email or web address] Date: 01/01/2021 Prepared by: Earlie Counts  Exercises Supine Single Knee to Chest - 1 x daily - 4 x weekly - 1 sets - 2 reps Supine Hamstring Stretch - 1 x daily - 4 x weekly - 1 sets - 2 reps - 30 sec hold Supine Figure 4 Piriformis Stretch - 1 x daily - 4 x weekly - 1 sets - 2 reps - 30 sec hold Diaphragmatic Breathing at 90/90 Supported - 2 x daily - 7 x weekly - 1 sets - 10 reps Supine Pelvic Floor Contraction - 1 x daily - 7 x weekly - 1 sets - 10 reps - 5 sec hold Supine March - 1 x daily - 4 x weekly - 2 sets - 10 reps Beginner Bridge - 1 x daily - 4 x weekly - 1 sets - 15 reps  [facility] [address] [place], [place] [address] Phone # [phone] Fax [phone]

## 2021-01-01 NOTE — Therapy (Signed)
[facility] [address] [place], [place], [address] Phone: [phone]   Fax:  [phone]  Physical Therapy Treatment  Patient Details  Name: [name] MRN: [id number] Date of Birth: [birth year] Referring Provider (PT): Dr. Dian Queen   Encounter Date: 01/01/2021   PT End of Session - 01/01/21 1535     Visit Number 6    Date for PT Re-Evaluation 01/29/21    Authorization Type [employer]    Authorization - Visit Number 6    Authorization - Number of Visits 10    PT Start Time [id number]    PT Stop Time [id number]    PT Time Calculation (min) 38 min    Activity Tolerance Patient tolerated treatment well;No increased pain    Behavior During Therapy [facility] for tasks assessed/performed             Past Medical History:  Diagnosis Date   Anxiety    Arthritis    Bulging of cervical intervertebral disc    Dental crowns present    Depression    Fibromyalgia    GERD (gastroesophageal reflux disease)    History of chemotherapy 2017   Docetaxel, Carboplatin, Herceptin   History of left breast cancer 01/2016   Multiple thyroid nodules    Palpitations     Past Surgical History:  Procedure Laterality Date   ABDOMINAL HYSTERECTOMY  04/2017   complete   COLONOSCOPY N/A 03/09/2017   Procedure: COLONOSCOPY;  Surgeon: Danie Binder, MD;  [facility];  Service: Endoscopy;  Laterality: N/A;  2:45pm   LAPAROSCOPIC APPENDECTOMY N/A 11/10/2012   Procedure: APPENDECTOMY LAPAROSCOPIC;  Surgeon: Jamesetta So, MD;  [facility]: [facility];  Service: General;  Laterality: N/A;   MASTECTOMY Left 01/07/2016   breast cancer   MASTECTOMY W/ SENTINEL NODE BIOPSY Left 01/07/2016   Procedure: LEFT TOTAL MASTECTOMY WITH LEFT AXILLARY SENTINEL LYMPH NODE BIOPSY;  Surgeon: Donnie Mesa, MD;  Location: [place];  Service: General;  Laterality: Left;   PORT-A-CATH REMOVAL N/A 09/07/2017   Procedure: REMOVAL PORT-A-CATH;   Surgeon: Donnie Mesa, MD;  Location: [place];  Service: General;  Laterality: N/A;   PORTACATH PLACEMENT Right 01/07/2016   Procedure: INSERTION PORT-A-CATH RIGHT INTERNAL JUGULAR WITH ULTRASOUND;  Surgeon: Donnie Mesa, MD;  Location: [place];  Service: General;  Laterality: Right;    There were no vitals filed for this visit.   Subjective Assessment - 01/01/21 1533     Subjective I am still doing well.    Currently in Pain? Yes    Pain Score 2     Pain Location Rectum    Pain Orientation Mid    Pain Descriptors / Indicators Pressure    Pain Type Chronic pain    Pain Onset More than a month ago    Pain Frequency Intermittent    Aggravating Factors  after eating a meal    Pain Relieving Factors after a bowel movement    Multiple Pain Sites No                [facility] PT Assessment - 01/01/21 0001       Strength   Right Hip ABduction 4/5    Right Hip ADduction 5/5    Left Hip ABduction 4-/5    Left Hip ADduction 5/5  [place] [facility] PT Treatment/Exercise - 01/01/21 0001       Self-Care   Self-Care Other Self-Care Comments    Other Self-Care Comments  discussed with patient on insoluble fibers to improve her to fully empty her bowels      Lumbar Exercises: Stretches   Active Hamstring Stretch Right;Left;1 rep;30 seconds    Active Hamstring Stretch Limitations supine    Single Knee to Chest Stretch Right;Left;1 rep;30 seconds    Single Knee to Chest Stretch Limitations supine    Piriformis Stretch Right;Left;1 rep;30 seconds    Piriformis Stretch Limitations supine pushing knee down      Lumbar Exercises: Supine   Bent Knee Raise 20 reps;1 second    Bent Knee Raise Limitations with many tactile and verbal cues to contract the lower abdominals and not raise the chest up to hinge at the thoracic    Other Supine Lumbar Exercises hookly pelvic floor contraction holding for 5 sec for 10x      Manual Therapy    Manual Therapy Myofascial release    Myofascial Release fascial release around the umbilicus with leg movements side to side, circular massage around the abdomen                    PT Education - 01/01/21 1612     Education Details Access Code: [email or web address]; educated patient on fiber for her diet    Person(s) Educated Patient    Methods Explanation;Demonstration;Verbal cues;Handout    Comprehension Returned demonstration;Verbalized understanding              PT Short Term Goals - 12/23/20 1445       PT SHORT TERM GOAL #1   Title independent with initial HEP for stretches and diaphragmatic breathing    Period Weeks    Status Achieved               PT Long Term Goals - 01/01/21 1537       PT LONG TERM GOAL #1   Title independent with advanced HEP    Time 12    Period Weeks    Status On-going      PT LONG TERM GOAL #2   Title able to have vaginally penetrative intercourse with minimal pain due to improve moisture of the vaginal canal and reduction of pelvic floor trigger points    Time 12    Period Weeks    Status Achieved      PT LONG TERM GOAL #3   Title ability to fully empty her bladder due to improve pelvic floor coordination and relaxation    Time 12    Period Weeks    Status Achieved      PT LONG TERM GOAL #4   Title understand ways to manage prolapse and improve pressure management    Time 12    Period Weeks    Status On-going      PT LONG TERM GOAL #5   Title decreased pelvic pressure >/= 75% due to improved strength >/= 3/5    Time 12    Period Weeks    Status Achieved                   Plan - 01/01/21 1536     Clinical Impression Statement Patient reports her rectal pressure is 90% better. Patient feels she does not fully empty her bowels all the time and was educated on fiber and how to incorporate into her diet. Patient has  fascial restrictions around her umbilicus. Patient is able to have intercoures with minimal pain  when she uses increased lubrication. Patient is able to perform her HEP but has difficutly with engaging her lower abdominals without lifing her lower rib cage. Patient will benefit from skilled therapy to improve vaginal health, reduce trigger points and improve coordination.    Personal Factors and Comorbidities Fitness;Past/Current Experience;Time since onset of injury/illness/exacerbation;Sex;Comorbidity 3+    Comorbidities Breast cancer 2x with left masectomy, chemotherapy and radiation; hysterectomy; Fibromyalgia; Abdominal Hysterectomy 11/10/2012; left masectomy 01/07/2016    Examination-Activity Limitations Squat;Stairs;Stand;Toileting;Continence;Transfers    Examination-Participation Restrictions Interpersonal Relationship;Laundry;Yard Work;Community Activity    Stability/Clinical Decision Making Evolving/Moderate complexity    Rehab Potential Good    PT Frequency 1x / week    PT Duration 12 weeks    PT Treatment/Interventions ADLs/Self Care Home Management;Biofeedback;Electrical Stimulation;Therapeutic activities;Therapeutic exercise;Neuromuscular re-education;Patient/family education;Manual techniques;Scar mobilization;Dry needling;Spinal Manipulations;Joint Manipulations    PT Next Visit Plan manual work to the abdomen especially around the umbilicus; renewal if she has to continue    PT Home Exercise Plan Access Code: [email or web address]    Consulted and Agree with Plan of Care Patient             Patient will benefit from skilled therapeutic intervention in order to improve the following deficits and impairments:  Decreased endurance, Decreased scar mobility, Pain, Increased fascial restricitons, Decreased strength, Decreased activity tolerance, Increased muscle spasms, Decreased range of motion, Decreased coordination  Visit Diagnosis: Muscle weakness (generalized)  Cramp and spasm  Other lack of coordination     Problem List Patient Active Problem List   Diagnosis Date Noted    Constipation 02/18/2017   Heme + stool 02/18/2017   Family history of breast cancer    Family history of colon cancer    Breast cancer of upper-outer quadrant of left female breast ([place]) 01/07/2016   Sprain of foot, left 01/15/2015   Sprain of foot 01/15/2015   Sprained ankle 01/15/2015   Mild mitral insufficiency 06/13/2013   Palpitations 06/13/2013   IMPINGEMENT SYNDROME 07/03/2009    Earlie Counts, PT 01/01/21 4:22 PM  [facility] [address], [place], [place], [address] Phone: [phone]   Fax:  [phone]  Name: LATREECE PHILIBERT MRN: [id number] Date of Birth: [birth year]

## 2021-01-08 ENCOUNTER — Ambulatory Visit: Payer: PPO | Attending: Obstetrics and Gynecology | Admitting: Physical Therapy

## 2021-01-08 ENCOUNTER — Other Ambulatory Visit: Payer: Self-pay

## 2021-01-08 ENCOUNTER — Encounter: Payer: Self-pay | Admitting: Physical Therapy

## 2021-01-08 DIAGNOSIS — R278 Other lack of coordination: Secondary | ICD-10-CM | POA: Insufficient documentation

## 2021-01-08 DIAGNOSIS — M6281 Muscle weakness (generalized): Secondary | ICD-10-CM | POA: Insufficient documentation

## 2021-01-08 DIAGNOSIS — R252 Cramp and spasm: Secondary | ICD-10-CM | POA: Diagnosis not present

## 2021-01-08 NOTE — Therapy (Signed)
[facility] [address] [place], [place], [address] Phone: [phone]   Fax:  [phone]  Physical Therapy Treatment  Patient Details  Name: [name] MRN: [id number] Date of Birth: [birth year] Referring Provider (PT): Dr. Dian Queen   Encounter Date: 01/08/2021   PT End of Session - 01/08/21 1519     Visit Number 7    Date for PT Re-Evaluation 01/29/21    Authorization Type [employer]    Authorization - Visit Number 7    Authorization - Number of Visits 10    PT Start Time 4888    PT Stop Time 1523    PT Time Calculation (min) 38 min    Activity Tolerance Patient tolerated treatment well;No increased pain    Behavior During Therapy [facility] for tasks assessed/performed             Past Medical History:  Diagnosis Date   Anxiety    Arthritis    Bulging of cervical intervertebral disc    Dental crowns present    Depression    Fibromyalgia    GERD (gastroesophageal reflux disease)    History of chemotherapy 2017   Docetaxel, Carboplatin, Herceptin   History of left breast cancer 01/2016   Multiple thyroid nodules    Palpitations     Past Surgical History:  Procedure Laterality Date   ABDOMINAL HYSTERECTOMY  04/2017   complete   COLONOSCOPY N/A 03/09/2017   Procedure: COLONOSCOPY;  Surgeon: Danie Binder, MD;  [facility];  Service: Endoscopy;  Laterality: N/A;  2:45pm   LAPAROSCOPIC APPENDECTOMY N/A 11/10/2012   Procedure: APPENDECTOMY LAPAROSCOPIC;  Surgeon: Jamesetta So, MD;  [facility]: [facility];  Service: General;  Laterality: N/A;   MASTECTOMY Left 01/07/2016   breast cancer   MASTECTOMY W/ SENTINEL NODE BIOPSY Left 01/07/2016   Procedure: LEFT TOTAL MASTECTOMY WITH LEFT AXILLARY SENTINEL LYMPH NODE BIOPSY;  Surgeon: Donnie Mesa, MD;  Location: [place];  Service: General;  Laterality: Left;   PORT-A-CATH REMOVAL N/A 09/07/2017   Procedure: REMOVAL PORT-A-CATH;   Surgeon: Donnie Mesa, MD;  Location: [place];  Service: General;  Laterality: N/A;   PORTACATH PLACEMENT Right 01/07/2016   Procedure: INSERTION PORT-A-CATH RIGHT INTERNAL JUGULAR WITH ULTRASOUND;  Surgeon: Donnie Mesa, MD;  Location: [place];  Service: General;  Laterality: Right;    There were no vitals filed for this visit.   Subjective Assessment - 01/08/21 1449     Subjective I am sitll feeling the pressure. Therapy has helped some. I have it after meals until I have a bowel movement.    Patient Stated Goals to strengthen her pelvic floor.    Currently in Pain? Yes    Pain Score 2     Pain Location Rectum    Pain Orientation Mid    Pain Descriptors / Indicators Pressure    Pain Type Chronic pain    Pain Onset More than a month ago    Pain Frequency Intermittent    Aggravating Factors  after eating a meal    Pain Relieving Factors after a bowel movement    Multiple Pain Sites No                [facility] PT Assessment - 01/08/21 0001       Assessment   Medical Diagnosis N81.89 Other female genital prolapse    Referring Provider (PT) Dr. Dian Queen    Prior Therapy none  Precautions   Precautions Other (comment)    Precaution Comments breast cancer with masectomy and chemotherapy      Restrictions   Weight Bearing Restrictions No      Home Environment   Living Environment Private residence      Prior Function   Level of Independence Independent      Cognition   Overall Cognitive Status Within Functional Limits for tasks assessed      ROM / Strength   AROM / PROM / Strength AROM;PROM;Strength      AROM   Lumbar Extension decreased by 25%    Lumbar - Right Side Bend full    Lumbar - Left Side Bend decreased 25%    Lumbar - Right Rotation full    Lumbar - Left Rotation full      Strength   Right Hip ABduction 4/5    Right Hip ADduction 5/5    Left Hip ABduction 4-/5    Left Hip ADduction 5/5                         Pelvic Floor Special Questions - 01/08/21 0001     Strength fair squeeze, definite lift               [facility] Adult PT Treatment/Exercise - 01/08/21 0001       Self-Care   Self-Care Other Self-Care Comments    Other Self-Care Comments  educated patient on probiotics and vitamins with probiotics, where to get them and some brands; education on foods that can cause increased pressure in the abdomen      Therapeutic Activites    Therapeutic Activities Other Therapeutic Activities    Other Therapeutic Activities education on double voiding and how to move the hips and sit again to fully empty the bladder      Lumbar Exercises: Supine   Clam 10 reps;1 second    Clam Limitations with yellow band    Dead Bug 10 reps;1 second    Dead Bug Limitations 10x each way with pelvic floor contraction    Bridge 15 reps;1 second    Bridge Limitations with yellow around knees                  Upper Extremity Functional Index Score :   /80   PT Education - 01/08/21 1518     Education Details Access Code: [email or web address]; education on souble voiding    Person(s) Educated Patient    Methods Explanation;Demonstration;Handout    Comprehension Returned demonstration;Verbalized understanding              PT Short Term Goals - 01/08/21 1457       PT SHORT TERM GOAL #1   Title independent with initial HEP for stretches and diaphragmatic breathing    Time 4    Period Weeks    Status Achieved      PT SHORT TERM GOAL #2   Title understand vaginal moisturizers and how they can help with the health of the tissue    Time 4    Period Weeks    Status Achieved      PT SHORT TERM GOAL #3   Title able to have the therapist work on the vaginal tissue without increased pain    Time 4    Period Weeks    Status Achieved               PT Long Term Goals - 01/08/21 1458  PT LONG TERM GOAL #1   Title independent with advanced HEP    Time 12    Period  Weeks    Status Achieved      PT LONG TERM GOAL #2   Title able to have vaginally penetrative intercourse with minimal pain due to improve moisture of the vaginal canal and reduction of pelvic floor trigger points    Time 12    Period Weeks    Status Achieved      PT LONG TERM GOAL #3   Title ability to fully empty her bladder due to improve pelvic floor coordination and relaxation    Time 12    Period Weeks    Status Partially Met      PT LONG TERM GOAL #4   Title understand ways to manage prolapse and improve pressure management    Time 12    Period Weeks    Status Achieved      PT LONG TERM GOAL #5   Title decreased pelvic pressure >/= 75% due to improved strength >/= 3/5    Time 12    Period Weeks    Status Achieved                   Plan - 01/08/21 1520     Clinical Impression Statement Patient reports her pelvic floor pressure is 90% better. She will leak sometimes after she urinates. Patient was educated on double voiding to avoid this. Patient understands how to manage pelvic prolapse. She was education on vaginal health, different vitamins and probiotics that may help with gut health, and different foods that could get aggravate the intestines. Patient is not having pain with vaginal penetration with her husband. Patient understands correct toileting. Patient is independent with her HEP and ready for discharge. She will contact her GI to get further tested for the rectal pressure.    Personal Factors and Comorbidities Fitness;Past/Current Experience;Time since onset of injury/illness/exacerbation;Sex;Comorbidity 3+    Comorbidities Breast cancer 2x with left masectomy, chemotherapy and radiation; hysterectomy; Fibromyalgia; Abdominal Hysterectomy 11/10/2012; left masectomy 01/07/2016    Examination-Activity Limitations Squat;Stairs;Stand;Toileting;Continence;Transfers    Examination-Participation Restrictions Interpersonal Relationship;Laundry;Yard Work;Community  Activity    Stability/Clinical Decision Making Evolving/Moderate complexity    Rehab Potential Good    PT Treatment/Interventions ADLs/Self Care Home Management;Biofeedback;Electrical Stimulation;Therapeutic activities;Therapeutic exercise;Neuromuscular re-education;Patient/family education;Manual techniques;Scar mobilization;Dry needling;Spinal Manipulations;Joint Manipulations    PT Next Visit Plan Discharge to HEP    PT Home Exercise Plan Access Code: [email or web address]    Consulted and Agree with Plan of Care Patient             Patient will benefit from skilled therapeutic intervention in order to improve the following deficits and impairments:  Decreased endurance, Decreased scar mobility, Pain, Increased fascial restricitons, Decreased strength, Decreased activity tolerance, Increased muscle spasms, Decreased range of motion, Decreased coordination  Visit Diagnosis: Muscle weakness (generalized)  Cramp and spasm  Other lack of coordination     Problem List Patient Active Problem List   Diagnosis Date Noted   Constipation 02/18/2017   Heme + stool 02/18/2017   Family history of breast cancer    Family history of colon cancer    Breast cancer of upper-outer quadrant of left female breast ([place]) 01/07/2016   Sprain of foot, left 01/15/2015   Sprain of foot 01/15/2015   Sprained ankle 01/15/2015   Mild mitral insufficiency 06/13/2013   Palpitations 06/13/2013   IMPINGEMENT SYNDROME 07/03/2009    Earlie Counts, PT 01/08/21 3:26  PM  [facility] [address] [place], [place], [address] Phone: [phone]   Fax:  [phone]  Name: [name] MRN: [id number] Date of Birth: [birth year]   PHYSICAL THERAPY DISCHARGE SUMMARY  Visits from Start of Care: 7  Current functional level related to goals / functional outcomes: See above.    Remaining deficits: See above.    Education / Equipment: HEP   Patient  agrees to discharge. Patient goals were met. Patient is being discharged due to meeting the stated rehab goals. Thank you for the referral. Earlie Counts, PT 01/08/21 3:27 PM

## 2021-01-08 NOTE — Patient Instructions (Addendum)
Double Voiding can be a very useful technique to help overcome incomplete emptying of your bladder.  Incomplete emptying of urine can result in leakage after using the bathroom and increase the risk of urinary tract infection.   Initial Void: When you first sit down to urinate, ensure optimal positioning for bladder emptying by following these guidelines for toileting posture: Sit on the toilet seat - don't hover over the seat Support your trunk by placing your hands on your knees or thighs Spread your knees and hips wide Position your feet flat on the floor or elevate feet on phone books, foot stool (Squatty Potty), or wrapped toilet paper rolls (if having knees above hips helps you empty) Lean forward from your hips Maintain the normal inward curve in your lower back   Repeated Void: After your initial void is complete, follow these movement patterns and attempt going to the bathroom again. Stand up Rotate your hips as if doing hula hoop in one direction Rotate using the same action in the other direction Rock your hips and pelvis back and forwards ("pelvic tilts") Rock your hips and pelvis side to side ("tail wag") Sit back down and repeat your voiding technique This technique can be repeated as many times as you choose to help you empty your bladder more effectively.  Access Code: [email or web address] URL: [email or web address] Date: 01/08/2021 Prepared by: Earlie Counts  Exercises Supine Single Knee to Chest - 1 x daily - 4 x weekly - 1 sets - 2 reps Supine Hamstring Stretch - 1 x daily - 4 x weekly - 1 sets - 2 reps - 30 sec hold Supine Figure 4 Piriformis Stretch - 1 x daily - 4 x weekly - 1 sets - 2 reps - 30 sec hold Diaphragmatic Breathing at 90/90 Supported - 2 x daily - 7 x weekly - 1 sets - 10 reps Supine Pelvic Floor Contraction - 1 x daily - 7 x weekly - 1 sets - 10 reps - 5 sec hold Dead Bug - 1 x daily - 7 x weekly - 1 sets - 10 reps Hooklying Isometric Clamshell -  1 x daily - 7 x weekly - 2 sets - 10 reps Supine Bridge with Resistance Band - 1 x daily - 7 x weekly - 2 sets - 10 reps  [facility] [address], [place], [place] [address] Phone # [phone] Fax [phone]

## 2021-01-16 DIAGNOSIS — E7849 Other hyperlipidemia: Secondary | ICD-10-CM | POA: Diagnosis not present

## 2021-01-16 DIAGNOSIS — E785 Hyperlipidemia, unspecified: Secondary | ICD-10-CM | POA: Diagnosis not present

## 2021-01-16 DIAGNOSIS — R498 Other voice and resonance disorders: Secondary | ICD-10-CM | POA: Diagnosis not present

## 2021-01-16 DIAGNOSIS — E049 Nontoxic goiter, unspecified: Secondary | ICD-10-CM | POA: Diagnosis not present

## 2021-01-16 DIAGNOSIS — C44501 Unspecified malignant neoplasm of skin of breast: Secondary | ICD-10-CM | POA: Diagnosis not present

## 2021-01-16 DIAGNOSIS — F419 Anxiety disorder, unspecified: Secondary | ICD-10-CM | POA: Diagnosis not present

## 2021-01-16 DIAGNOSIS — R7989 Other specified abnormal findings of blood chemistry: Secondary | ICD-10-CM | POA: Diagnosis not present

## 2021-01-16 DIAGNOSIS — M797 Fibromyalgia: Secondary | ICD-10-CM | POA: Diagnosis not present

## 2021-01-16 DIAGNOSIS — E663 Overweight: Secondary | ICD-10-CM | POA: Diagnosis not present

## 2021-01-16 DIAGNOSIS — Z6825 Body mass index (BMI) 25.0-25.9, adult: Secondary | ICD-10-CM | POA: Diagnosis not present

## 2021-01-21 DIAGNOSIS — E663 Overweight: Secondary | ICD-10-CM | POA: Diagnosis not present

## 2021-01-21 DIAGNOSIS — Z6825 Body mass index (BMI) 25.0-25.9, adult: Secondary | ICD-10-CM | POA: Diagnosis not present

## 2021-01-21 DIAGNOSIS — R498 Other voice and resonance disorders: Secondary | ICD-10-CM | POA: Diagnosis not present

## 2021-01-21 DIAGNOSIS — E785 Hyperlipidemia, unspecified: Secondary | ICD-10-CM | POA: Diagnosis not present

## 2021-01-21 DIAGNOSIS — F419 Anxiety disorder, unspecified: Secondary | ICD-10-CM | POA: Diagnosis not present

## 2021-01-21 DIAGNOSIS — E7849 Other hyperlipidemia: Secondary | ICD-10-CM | POA: Diagnosis not present

## 2021-01-21 DIAGNOSIS — M797 Fibromyalgia: Secondary | ICD-10-CM | POA: Diagnosis not present

## 2021-01-21 DIAGNOSIS — R7989 Other specified abnormal findings of blood chemistry: Secondary | ICD-10-CM | POA: Diagnosis not present

## 2021-01-23 ENCOUNTER — Ambulatory Visit
Admission: RE | Admit: 2021-01-23 | Discharge: 2021-01-23 | Disposition: A | Discharge status: Home / Self Care | Payer: PPO | Source: Ambulatory Visit | Attending: Hematology | Admitting: Hematology

## 2021-01-23 ENCOUNTER — Other Ambulatory Visit: Payer: Self-pay

## 2021-01-23 DIAGNOSIS — Z1231 Encounter for screening mammogram for malignant neoplasm of breast: Secondary | ICD-10-CM | POA: Diagnosis not present

## 2021-02-03 ENCOUNTER — Other Ambulatory Visit (HOSPITAL_COMMUNITY): Payer: Self-pay | Admitting: *Deleted

## 2021-02-03 DIAGNOSIS — Z1231 Encounter for screening mammogram for malignant neoplasm of breast: Secondary | ICD-10-CM

## 2021-02-03 DIAGNOSIS — C50412 Malignant neoplasm of upper-outer quadrant of left female breast: Secondary | ICD-10-CM

## 2021-02-05 ENCOUNTER — Inpatient Hospital Stay (HOSPITAL_COMMUNITY): Payer: PPO | Attending: Hematology

## 2021-02-05 ENCOUNTER — Other Ambulatory Visit: Payer: Self-pay

## 2021-02-05 DIAGNOSIS — K760 Fatty (change of) liver, not elsewhere classified: Secondary | ICD-10-CM | POA: Diagnosis not present

## 2021-02-05 DIAGNOSIS — Z171 Estrogen receptor negative status [ER-]: Secondary | ICD-10-CM | POA: Diagnosis not present

## 2021-02-05 DIAGNOSIS — C50412 Malignant neoplasm of upper-outer quadrant of left female breast: Secondary | ICD-10-CM | POA: Insufficient documentation

## 2021-02-05 LAB — CBC WITH DIFFERENTIAL/PLATELET
Abs Immature Granulocytes: 0.01 10*3/uL (ref 0.00–0.07)
Basophils Absolute: 0.1 10*3/uL (ref 0.0–0.1)
Basophils Relative: 1 %
Eosinophils Absolute: 0.5 10*3/uL (ref 0.0–0.5)
Eosinophils Relative: 8 %
HCT: 47.9 % — ABNORMAL HIGH (ref 36.0–46.0)
Hemoglobin: 15.7 g/dL — ABNORMAL HIGH (ref 12.0–15.0)
Immature Granulocytes: 0 %
Lymphocytes Relative: 43 %
Lymphs Abs: 2.7 10*3/uL (ref 0.7–4.0)
MCH: 32.4 pg (ref 26.0–34.0)
MCHC: 32.8 g/dL (ref 30.0–36.0)
MCV: 99 fL (ref 80.0–100.0)
Monocytes Absolute: 0.4 10*3/uL (ref 0.1–1.0)
Monocytes Relative: 7 %
Neutro Abs: 2.5 10*3/uL (ref 1.7–7.7)
Neutrophils Relative %: 41 %
Platelets: 265 10*3/uL (ref 150–400)
RBC: 4.84 MIL/uL (ref 3.87–5.11)
RDW: 12.2 % (ref 11.5–15.5)
WBC: 6.2 10*3/uL (ref 4.0–10.5)
nRBC: 0 % (ref 0.0–0.2)

## 2021-02-05 LAB — COMPREHENSIVE METABOLIC PANEL
ALT: 23 U/L (ref 0–44)
AST: 26 U/L (ref 15–41)
Albumin: 4.6 g/dL (ref 3.5–5.0)
Alkaline Phosphatase: 74 U/L (ref 38–126)
Anion gap: 8 (ref 5–15)
BUN: 14 mg/dL (ref 8–23)
CO2: 29 mmol/L (ref 22–32)
Calcium: 10 mg/dL (ref 8.9–10.3)
Chloride: 103 mmol/L (ref 98–111)
Creatinine, Ser: 0.63 mg/dL (ref 0.44–1.00)
GFR, Estimated: >60 mL/min (ref >60)
Glucose, Bld: 102 mg/dL — ABNORMAL HIGH (ref 70–99)
Potassium: 5 mmol/L (ref 3.5–5.1)
Sodium: 140 mmol/L (ref 135–145)
Total Bilirubin: 0.7 mg/dL (ref 0.3–1.2)
Total Protein: 8.1 g/dL (ref 6.5–8.1)

## 2021-02-05 LAB — LACTATE DEHYDROGENASE: LDH: 121 U/L (ref 98–192)

## 2021-02-05 LAB — VITAMIN D 25 HYDROXY (VIT D DEFICIENCY, FRACTURES): Vit D, 25-Hydroxy: 59.98 ng/mL (ref 30–100)

## 2021-02-05 LAB — VITAMIN B12: Vitamin B-12: 730 pg/mL (ref 180–914)

## 2021-02-11 NOTE — Progress Notes (Signed)
[facility] [address], [place] [address]   Patient Care Team: Sharilyn Sites, MD as PCP - General (Family Medicine) Satira Sark, MD as PCP - Cardiology (Cardiology) Danie Binder, MD (Inactive) as Consulting Physician (Gastroenterology) Eloise Harman, DO as Consulting Physician (Internal Medicine)  SUMMARY OF ONCOLOGIC HISTORY: Oncology History  Breast cancer of upper-outer quadrant of left female breast [facility])  11/07/1999 Surgery   Lumpectomy/sentinel LN biopsy invasive ductal carcinoma and DCIS at [facility], 0/6 sentinel LN   11/10/1999 - 01/08/2005 Anti-estrogen oral therapy   Tamoxifen 20 mg daily   11/24/1999 - 01/02/2000 Radiation Therapy     12/18/2015 Mammogram   2D digital screening bilateral mammogram with CAD and adjunct TOMO, in the L breast a possible mass warrants further evaluation. In R breast no findings suspicious for malignancy   12/24/2015 Pathology Results   Invasive ductal carcinoma Grade 3 Left core needle biopsy 1:30 o clock ER- PR- Ki67 70%, HER 2 positive   12/24/2015 Imaging   L breast Ultrasound, notes palpable area of concern demonstrates a hypoechoic irregular mass at 1:30, 7 cm from the nipple measuring 1.3 x 0.9 x 1.4 cm. No evidence of L axillary LAD   01/07/2016 Procedure   Left simple mastectomy by Dr. Georgette [place]   01/08/2016 Pathology Results   Breast, simple mastectomy, Left - INVASIVE DUCTAL CARCINOMA, GRADE III/III, SPANNING 1.4 CM. - THE SURGICAL RESECTION MARGINS ARE NEGATIVE FOR CARCINOMA.   01/23/2016 Genetic Testing   Patient refused genetic testing at time of consultation with Roma Kayser.  "Despite our recommendation, Ms. Cue did not wish to pursue genetic testing at today's visit."   01/28/2016 Echocardiogram   Left ventricle: The cavity size was normal. Wall thickness was   normal. Systolic function was normal. The estimated ejection   fraction was in the range of 55% to 60%. Wall motion was  normal;   there were no regional wall motion abnormalities. Doppler   parameters are consistent with abnormal left ventricular   relaxation (grade 1 diastolic dysfunction).   01/31/2016 - 05/15/2016 Chemotherapy   The patient had palonosetron (ALOXI) injection 0.25 mg, 0.25 mg, Intravenous,  Once, 1 of 6 cycles  pegfilgrastim (NEULASTA ONPRO KIT) injection 6 mg, 6 mg, Subcutaneous, Once, 1 of 6 cycles  CARBOplatin (PARAPLATIN) 560 mg in sodium chloride 0.9 % 250 mL chemo infusion, 560 mg (100 % of original dose 558.5 mg), Intravenous,  Once, 1 of 6 cycles Dose modification:   (original dose 558.5 mg, Cycle 1),   (original dose 558.5 mg, Cycle 2)  DOCEtaxel (TAXOTERE) 140 mg in dextrose 5 % 250 mL chemo infusion, 75 mg/m2 = 140 mg, Intravenous,  Once, 1 of 6 cycles  for chemotherapy treatment.     01/31/2016 - 01/01/2017 Antibody Plan   Herceptin x 52 weeks   08/24/2016 Echocardiogram   Left ventricle: The cavity size was normal. Wall thickness was   normal. Systolic function was normal. The estimated ejection   fraction was in the range of 55% to 60%. Wall motion was normal;   there were no regional wall motion abnormalities. Doppler   parameters are consistent with abnormal left ventricular   relaxation (grade 1 diastolic dysfunction).   11/18/2016 Echocardiogram   LV EF: 55% -   60%. - Left ventricle: The cavity size was normal. Wall thickness was   normal. Systolic function was normal. The estimated ejection   fraction was in the range of 55% to 60%.  Wall motion was normal;   there were no regional wall motion abnormalities. Left   ventricular diastolic function parameters were normal. - Mitral valve: There was mild regurgitation. - Tricuspid valve: There was mild regurgitation.     CHIEF COMPLIANT: Follow-up for breast cancer   INTERVAL HISTORY: [name] is a 65 y.o. female here today for follow up of her breast cancer. Her last visit was on 01/31/2020 by Francene Finders, NP-C.   Today she reports feeling good. She reports intentional weight loss.   REVIEW OF SYSTEMS:   Review of Systems  Constitutional:  Negative for appetite change, fatigue (75%) and unexpected weight change.  Respiratory:  Positive for cough.   Neurological:  Positive for numbness (fingertips).  Psychiatric/Behavioral:  Positive for sleep disturbance.   All other systems reviewed and are negative.  I have reviewed the past medical history, past surgical history, social history and family history with the patient and they are unchanged from previous note.   ALLERGIES:   is allergic to codeine, latex, and naprosyn [naproxen].   MEDICATIONS:  Current Outpatient Medications  Medication Sig Dispense Refill   ALPRAZolam (XANAX) 0.5 MG tablet Take 1 tablet (0.5 mg total) by mouth 2 (two) times daily as needed for anxiety. (Patient not taking: Reported on 02/12/2021) 60 tablet 1   Cholecalciferol (VITAMIN D3) 2000 units TABS Take 2,000 Units by mouth every morning.      Cyanocobalamin (VITAMIN B 12 PO) Take by mouth.     metoprolol tartrate (LOPRESSOR) 25 MG tablet Take 1 tablet (25 mg total) by mouth 2 (two) times daily. 25 mg in the morning. 12.5 mg in the evening, 60 tablet 0   rosuvastatin (CRESTOR) 5 MG tablet Take 5 mg by mouth daily.     No current facility-administered medications for this visit.   Facility-Administered Medications Ordered in Other Visits  Medication Dose Route Frequency Provider Last Rate Last Admin   sodium chloride flush (NS) 0.9 % injection 10 mL  10 mL Intracatheter PRN Penland, Kelby Fam, MD         PHYSICAL EXAMINATION: Performance status (ECOG): 1 - Symptomatic but completely ambulatory  Vitals:   02/12/21 1139  BP: 126/72  Pulse: 70  Resp: 17  Temp: (!) 96.7 F (35.9 C)  SpO2: 99%   Wt Readings from Last 3 Encounters:  02/12/21 159 lb 6.4 oz (72.3 kg)  07/29/20 166 lb 3.2 oz (75.4 kg)  01/31/20 171 lb 15.3 oz (78 kg)   Physical  Exam Vitals reviewed.  Constitutional:      Appearance: Normal appearance.  Cardiovascular:     Rate and Rhythm: Normal rate and regular rhythm.     Pulses: Normal pulses.     Heart sounds: Normal heart sounds.  Pulmonary:     Effort: Pulmonary effort is normal.     Breath sounds: Normal breath sounds.  Chest:  Breasts:    Right: Normal. No inverted nipple, mass, nipple discharge, skin change or tenderness.     Left: Absent. No skin change (mastectomy site within normal limits).  Abdominal:     Palpations: Abdomen is soft. There is no hepatomegaly, splenomegaly or mass.     Tenderness: There is no abdominal tenderness.  Musculoskeletal:     Right lower leg: No edema.     Left lower leg: No edema.  Neurological:     General: No focal deficit present.     Mental Status: She is alert and oriented to person, place,  and time.  Psychiatric:        Mood and Affect: Mood normal.        Behavior: Behavior normal.    Breast Exam Chaperone: Thana Ates     LABORATORY DATA:  I have reviewed the data as listed CMP Latest Ref Rng & Units 02/05/2021 01/24/2020 07/04/2019  Glucose 70 - 99 mg/dL 102(H) 91 101(H)  BUN 8 - 23 mg/dL $Remove'14 14 16  'DJfrWtS$ Creatinine 0.44 - 1.00 mg/dL 0.63 0.74 0.72  Sodium 135 - 145 mmol/L 140 137 136  Potassium 3.5 - 5.1 mmol/L 5.0 4.3 4.6  Chloride 98 - 111 mmol/L 103 102 102  CO2 22 - 32 mmol/L $RemoveB'29 25 25  'nSlaUKGa$ Calcium 8.9 - 10.3 mg/dL 10.0 9.6 9.6  Total Protein 6.5 - 8.1 g/dL 8.1 7.5 8.1  Total Bilirubin 0.3 - 1.2 mg/dL 0.7 0.7 0.7  Alkaline Phos 38 - 126 U/L 74 62 77  AST 15 - 41 U/L $Remo'26 27 29  'bRSlD$ ALT 0 - 44 U/L 23 29 33   No results found for: TKW409 Lab Results  Component Value Date   WBC 6.2 02/05/2021   HGB 15.7 (H) 02/05/2021   HCT 47.9 (H) 02/05/2021   MCV 99.0 02/05/2021   PLT 265 02/05/2021   NEUTROABS 2.5 02/05/2021    ASSESSMENT:  1.  Stage Ia left breast invasive ductal carcinoma: - Patient was diagnosed with invasive ductal carcinoma, ER negative/PR  negative/HER-2 positive. -Patient is status post mastectomy and Taxotere\carbo\Herceptin x6 cycles; maintenance Herceptin every 3 weeks for 1 year, she completed that on 01/01/2017. -She did not require adjuvant radiation therapy.  Patient was not recommended for adjuvant antiestrogen therapy given ER negative. -Last echo on 11/18/2016 was normal with a EF of 55 to 60%. - Last mammogram done on 01/23/2020 was BI-RADS Category 1 negative.    PLAN:  1.  Stage Ia left breast invasive ductal carcinoma: - Physical exam today shows left mastectomy site is within normal limits.  Right breast has no palpable masses. - Reviewed labs from 02/05/2021 which showed normal LFTs.  She has slightly elevated hemoglobin and hematocrit which is stable.  She is non-smoker. Recent ultrasound of the abdomen on 12/06/2020 showed normal spleen.  Hepatic steatosis was seen. - Mammogram of the right breast on 01/23/2021 was BI-RADS Category 1. - Recommend follow-up in 1 year with repeat labs.  Breast Cancer therapy associated bone loss: I have recommended calcium, Vitamin D and weight bearing exercises.  Orders placed this encounter:  No orders of the defined types were placed in this encounter.   The patient has a good understanding of the overall plan. She agrees with it. She will call with any problems that may develop before the next visit here.  Derek Jack, MD [place] [phone]   I, Thana Ates, am acting as a scribe for Dr. Derek Jack.  I, Derek Jack MD, have reviewed the above documentation for accuracy and completeness, and I agree with the above.

## 2021-02-12 ENCOUNTER — Inpatient Hospital Stay (HOSPITAL_COMMUNITY): Payer: PPO | Admitting: Hematology

## 2021-02-12 ENCOUNTER — Other Ambulatory Visit: Payer: Self-pay

## 2021-02-12 VITALS — BP 126/72 | HR 70 | Temp 96.7°F | Resp 17 | Wt 159.4 lb

## 2021-02-12 DIAGNOSIS — Z171 Estrogen receptor negative status [ER-]: Secondary | ICD-10-CM | POA: Diagnosis not present

## 2021-02-12 DIAGNOSIS — C50412 Malignant neoplasm of upper-outer quadrant of left female breast: Secondary | ICD-10-CM

## 2021-02-12 NOTE — Patient Instructions (Signed)
[place] at [facility] Discharge Instructions  You were seen and examined by Dr. Delton Coombes today.  Return to clinic as scheduled in 1 year with lab work.  Mammogram in September 2023.    Thank you for choosing [place] at [facility] to provide your oncology and hematology care.  To afford each patient quality time with our provider, please arrive at least 15 minutes before your scheduled appointment time.   If you have a lab appointment with the Portal please come in thru the Main Entrance and check in at the main information desk.  You need to re-schedule your appointment should you arrive 10 or more minutes late.  We strive to give you quality time with our providers, and arriving late affects you and other patients whose appointments are after yours.  Also, if you no show three or more times for appointments you may be dismissed from the clinic at the providers discretion.     Again, thank you for choosing [facility].  Our hope is that these requests will decrease the amount of time that you wait before being seen by our physicians.       _____________________________________________________________  Should you have questions after your visit to [facility], please contact our office at [phone] and follow the prompts.  Our office hours are 8:00 a.m. and 4:30 p.m. Monday - Friday.  Please note that voicemails left after 4:00 p.m. may not be returned until the following business day.  We are closed weekends and major holidays.  You do have access to a nurse 24-7, just call the main number to the clinic [phone] and do not press any options, hold on the line and a nurse will answer the phone.    For prescription refill requests, have your pharmacy contact our office and allow 72 hours.    Due to Covid, you will need to wear a mask upon entering the hospital. If you do not have a mask, a mask will be  given to you at the Main Entrance upon arrival. For doctor visits, patients may have 1 support person age 48 or older with them. For treatment visits, patients can not have anyone with them due to social distancing guidelines and our immunocompromised population.

## 2021-04-04 DIAGNOSIS — R490 Dysphonia: Secondary | ICD-10-CM | POA: Diagnosis not present

## 2021-04-04 DIAGNOSIS — K219 Gastro-esophageal reflux disease without esophagitis: Secondary | ICD-10-CM | POA: Diagnosis not present

## 2021-04-04 DIAGNOSIS — R070 Pain in throat: Secondary | ICD-10-CM | POA: Diagnosis not present

## 2021-07-17 DIAGNOSIS — C50912 Malignant neoplasm of unspecified site of left female breast: Secondary | ICD-10-CM | POA: Diagnosis not present

## 2021-07-22 DIAGNOSIS — C50912 Malignant neoplasm of unspecified site of left female breast: Secondary | ICD-10-CM | POA: Diagnosis not present

## 2021-08-12 DIAGNOSIS — C50912 Malignant neoplasm of unspecified site of left female breast: Secondary | ICD-10-CM | POA: Diagnosis not present

## 2021-08-13 DIAGNOSIS — E049 Nontoxic goiter, unspecified: Secondary | ICD-10-CM | POA: Diagnosis not present

## 2021-08-13 DIAGNOSIS — M797 Fibromyalgia: Secondary | ICD-10-CM | POA: Diagnosis not present

## 2021-08-13 DIAGNOSIS — E782 Mixed hyperlipidemia: Secondary | ICD-10-CM | POA: Diagnosis not present

## 2021-08-13 DIAGNOSIS — J309 Allergic rhinitis, unspecified: Secondary | ICD-10-CM | POA: Diagnosis not present

## 2021-08-13 DIAGNOSIS — K219 Gastro-esophageal reflux disease without esophagitis: Secondary | ICD-10-CM | POA: Diagnosis not present

## 2021-08-13 DIAGNOSIS — Z6825 Body mass index (BMI) 25.0-25.9, adult: Secondary | ICD-10-CM | POA: Diagnosis not present

## 2021-08-13 DIAGNOSIS — E663 Overweight: Secondary | ICD-10-CM | POA: Diagnosis not present

## 2021-08-13 DIAGNOSIS — E063 Autoimmune thyroiditis: Secondary | ICD-10-CM | POA: Diagnosis not present

## 2021-08-13 DIAGNOSIS — Z1331 Encounter for screening for depression: Secondary | ICD-10-CM | POA: Diagnosis not present

## 2021-08-13 DIAGNOSIS — G2581 Restless legs syndrome: Secondary | ICD-10-CM | POA: Diagnosis not present

## 2021-08-13 DIAGNOSIS — F419 Anxiety disorder, unspecified: Secondary | ICD-10-CM | POA: Diagnosis not present

## 2021-08-13 DIAGNOSIS — E7849 Other hyperlipidemia: Secondary | ICD-10-CM | POA: Diagnosis not present

## 2021-08-13 DIAGNOSIS — R5383 Other fatigue: Secondary | ICD-10-CM | POA: Diagnosis not present

## 2021-11-26 DIAGNOSIS — I4891 Unspecified atrial fibrillation: Secondary | ICD-10-CM | POA: Diagnosis not present

## 2021-11-26 DIAGNOSIS — Z853 Personal history of malignant neoplasm of breast: Secondary | ICD-10-CM | POA: Diagnosis not present

## 2021-11-26 DIAGNOSIS — G4700 Insomnia, unspecified: Secondary | ICD-10-CM | POA: Diagnosis not present

## 2021-11-26 DIAGNOSIS — E041 Nontoxic single thyroid nodule: Secondary | ICD-10-CM | POA: Diagnosis not present

## 2021-11-26 DIAGNOSIS — D6869 Other thrombophilia: Secondary | ICD-10-CM | POA: Diagnosis not present

## 2021-11-26 DIAGNOSIS — K219 Gastro-esophageal reflux disease without esophagitis: Secondary | ICD-10-CM | POA: Diagnosis not present

## 2021-11-26 DIAGNOSIS — E785 Hyperlipidemia, unspecified: Secondary | ICD-10-CM | POA: Diagnosis not present

## 2021-11-26 DIAGNOSIS — M8580 Other specified disorders of bone density and structure, unspecified site: Secondary | ICD-10-CM | POA: Diagnosis not present

## 2021-11-26 DIAGNOSIS — E663 Overweight: Secondary | ICD-10-CM | POA: Diagnosis not present

## 2021-12-23 DIAGNOSIS — Z1272 Encounter for screening for malignant neoplasm of vagina: Secondary | ICD-10-CM | POA: Diagnosis not present

## 2021-12-23 DIAGNOSIS — Z853 Personal history of malignant neoplasm of breast: Secondary | ICD-10-CM | POA: Diagnosis not present

## 2021-12-23 DIAGNOSIS — Z124 Encounter for screening for malignant neoplasm of cervix: Secondary | ICD-10-CM | POA: Diagnosis not present

## 2021-12-23 DIAGNOSIS — Z6825 Body mass index (BMI) 25.0-25.9, adult: Secondary | ICD-10-CM | POA: Diagnosis not present

## 2022-01-01 ENCOUNTER — Other Ambulatory Visit (HOSPITAL_COMMUNITY): Payer: Self-pay | Admitting: Internal Medicine

## 2022-01-01 ENCOUNTER — Ambulatory Visit (HOSPITAL_COMMUNITY)
Admission: RE | Admit: 2022-01-01 | Discharge: 2022-01-01 | Disposition: A | Discharge status: Home / Self Care | Payer: PPO | Source: Ambulatory Visit | Attending: Internal Medicine | Admitting: Internal Medicine

## 2022-01-01 DIAGNOSIS — G2581 Restless legs syndrome: Secondary | ICD-10-CM | POA: Diagnosis not present

## 2022-01-01 DIAGNOSIS — M1711 Unilateral primary osteoarthritis, right knee: Secondary | ICD-10-CM | POA: Diagnosis not present

## 2022-01-01 DIAGNOSIS — E063 Autoimmune thyroiditis: Secondary | ICD-10-CM | POA: Diagnosis not present

## 2022-01-01 DIAGNOSIS — M25561 Pain in right knee: Secondary | ICD-10-CM | POA: Insufficient documentation

## 2022-01-01 DIAGNOSIS — E663 Overweight: Secondary | ICD-10-CM | POA: Diagnosis not present

## 2022-01-01 DIAGNOSIS — C50919 Malignant neoplasm of unspecified site of unspecified female breast: Secondary | ICD-10-CM | POA: Diagnosis not present

## 2022-01-01 DIAGNOSIS — Z6825 Body mass index (BMI) 25.0-25.9, adult: Secondary | ICD-10-CM | POA: Diagnosis not present

## 2022-01-12 DIAGNOSIS — M25561 Pain in right knee: Secondary | ICD-10-CM | POA: Diagnosis not present

## 2022-01-26 ENCOUNTER — Ambulatory Visit
Admission: RE | Admit: 2022-01-26 | Discharge: 2022-01-26 | Disposition: A | Discharge status: Home / Self Care | Payer: PPO | Source: Ambulatory Visit | Attending: Hematology | Admitting: Hematology

## 2022-01-26 DIAGNOSIS — Z1231 Encounter for screening mammogram for malignant neoplasm of breast: Secondary | ICD-10-CM | POA: Diagnosis not present

## 2022-01-26 DIAGNOSIS — Z171 Estrogen receptor negative status [ER-]: Secondary | ICD-10-CM

## 2022-02-05 ENCOUNTER — Other Ambulatory Visit (HOSPITAL_COMMUNITY)
Admission: RE | Admit: 2022-02-05 | Discharge: 2022-02-05 | Disposition: A | Discharge status: Home / Self Care | Payer: PPO | Source: Ambulatory Visit | Attending: Family Medicine | Admitting: Family Medicine

## 2022-02-05 ENCOUNTER — Inpatient Hospital Stay: Payer: PPO | Attending: Hematology

## 2022-02-05 DIAGNOSIS — Z853 Personal history of malignant neoplasm of breast: Secondary | ICD-10-CM | POA: Diagnosis not present

## 2022-02-05 DIAGNOSIS — E063 Autoimmune thyroiditis: Secondary | ICD-10-CM | POA: Insufficient documentation

## 2022-02-05 DIAGNOSIS — M25561 Pain in right knee: Secondary | ICD-10-CM | POA: Diagnosis not present

## 2022-02-05 DIAGNOSIS — Z9012 Acquired absence of left breast and nipple: Secondary | ICD-10-CM | POA: Insufficient documentation

## 2022-02-05 DIAGNOSIS — E559 Vitamin D deficiency, unspecified: Secondary | ICD-10-CM | POA: Insufficient documentation

## 2022-02-05 DIAGNOSIS — Z171 Estrogen receptor negative status [ER-]: Secondary | ICD-10-CM

## 2022-02-05 LAB — CBC WITH DIFFERENTIAL/PLATELET
Abs Immature Granulocytes: 0.01 10*3/uL (ref 0.00–0.07)
Basophils Absolute: 0.1 10*3/uL (ref 0.0–0.1)
Basophils Relative: 1 %
Eosinophils Absolute: 0.4 10*3/uL (ref 0.0–0.5)
Eosinophils Relative: 8 %
HCT: 46.2 % — ABNORMAL HIGH (ref 36.0–46.0)
Hemoglobin: 15.2 g/dL — ABNORMAL HIGH (ref 12.0–15.0)
Immature Granulocytes: 0 %
Lymphocytes Relative: 48 %
Lymphs Abs: 2.4 10*3/uL (ref 0.7–4.0)
MCH: 31.2 pg (ref 26.0–34.0)
MCHC: 32.9 g/dL (ref 30.0–36.0)
MCV: 94.9 fL (ref 80.0–100.0)
Monocytes Absolute: 0.3 10*3/uL (ref 0.1–1.0)
Monocytes Relative: 6 %
Neutro Abs: 1.9 10*3/uL (ref 1.7–7.7)
Neutrophils Relative %: 37 %
Platelets: 245 10*3/uL (ref 150–400)
RBC: 4.87 MIL/uL (ref 3.87–5.11)
RDW: 12.4 % (ref 11.5–15.5)
WBC: 5 10*3/uL (ref 4.0–10.5)
nRBC: 0 % (ref 0.0–0.2)

## 2022-02-05 LAB — LIPID PANEL
Cholesterol: 203 mg/dL — ABNORMAL HIGH (ref 0–200)
HDL: 76 mg/dL (ref >40)
LDL Cholesterol: 98 mg/dL (ref 0–99)
Total CHOL/HDL Ratio: 2.7 RATIO
Triglycerides: 147 mg/dL (ref <150)
VLDL: 29 mg/dL (ref 0–40)

## 2022-02-05 LAB — COMPREHENSIVE METABOLIC PANEL
ALT: 29 U/L (ref 0–44)
AST: 33 U/L (ref 15–41)
Albumin: 4.2 g/dL (ref 3.5–5.0)
Alkaline Phosphatase: 64 U/L (ref 38–126)
Anion gap: 9 (ref 5–15)
BUN: 11 mg/dL (ref 8–23)
CO2: 26 mmol/L (ref 22–32)
Calcium: 9.7 mg/dL (ref 8.9–10.3)
Chloride: 104 mmol/L (ref 98–111)
Creatinine, Ser: 0.62 mg/dL (ref 0.44–1.00)
GFR, Estimated: >60 mL/min (ref >60)
Glucose, Bld: 100 mg/dL — ABNORMAL HIGH (ref 70–99)
Potassium: 4.4 mmol/L (ref 3.5–5.1)
Sodium: 139 mmol/L (ref 135–145)
Total Bilirubin: 0.9 mg/dL (ref 0.3–1.2)
Total Protein: 7.6 g/dL (ref 6.5–8.1)

## 2022-02-05 LAB — TSH: TSH: 2.161 u[IU]/mL (ref 0.350–4.500)

## 2022-02-05 LAB — VITAMIN D 25 HYDROXY (VIT D DEFICIENCY, FRACTURES): Vit D, 25-Hydroxy: 39.11 ng/mL (ref 30–100)

## 2022-02-05 LAB — LACTATE DEHYDROGENASE: LDH: 124 U/L (ref 98–192)

## 2022-02-05 LAB — VITAMIN B12: Vitamin B-12: 287 pg/mL (ref 180–914)

## 2022-02-09 DIAGNOSIS — M25561 Pain in right knee: Secondary | ICD-10-CM | POA: Diagnosis not present

## 2022-02-10 DIAGNOSIS — Z0001 Encounter for general adult medical examination with abnormal findings: Secondary | ICD-10-CM | POA: Diagnosis not present

## 2022-02-10 DIAGNOSIS — Z1331 Encounter for screening for depression: Secondary | ICD-10-CM | POA: Diagnosis not present

## 2022-02-10 DIAGNOSIS — Z6826 Body mass index (BMI) 26.0-26.9, adult: Secondary | ICD-10-CM | POA: Diagnosis not present

## 2022-02-12 ENCOUNTER — Inpatient Hospital Stay (HOSPITAL_BASED_OUTPATIENT_CLINIC_OR_DEPARTMENT_OTHER): Payer: PPO | Admitting: Hematology

## 2022-02-12 VITALS — BP 134/76 | HR 79 | Temp 98.6°F | Resp 18

## 2022-02-12 DIAGNOSIS — Z853 Personal history of malignant neoplasm of breast: Secondary | ICD-10-CM | POA: Diagnosis not present

## 2022-02-12 DIAGNOSIS — Z171 Estrogen receptor negative status [ER-]: Secondary | ICD-10-CM | POA: Diagnosis not present

## 2022-02-12 DIAGNOSIS — Z1231 Encounter for screening mammogram for malignant neoplasm of breast: Secondary | ICD-10-CM | POA: Diagnosis not present

## 2022-02-12 DIAGNOSIS — C50412 Malignant neoplasm of upper-outer quadrant of left female breast: Secondary | ICD-10-CM

## 2022-02-12 NOTE — Patient Instructions (Addendum)
[name]  Discharge Instructions  You were seen and examined today by Dr. Delton Coombes.  Dr. Delton Coombes discussed your most recent lab work and mammogram which revealed that everything looks good.   Continue taking Vitamin D as prescribed.   Follow-up as scheduled in 1 year with labs and mammogram.    Thank you for choosing [facility] to provide your oncology and hematology care.   To afford each patient quality time with our provider, please arrive at least 15 minutes before your scheduled appointment time. You may need to reschedule your appointment if you arrive late (10 or more minutes). Arriving late affects you and other patients whose appointments are after yours.  Also, if you miss three or more appointments without notifying the office, you may be dismissed from the clinic at the provider's discretion.    Again, thank you for choosing [facility].  Our hope is that these requests will decrease the amount of time that you wait before being seen by our physicians.   If you have a lab appointment with the [facility] please come in thru the Main Entrance and check in at the main information desk.           _____________________________________________________________  Should you have questions after your visit to [facility], please contact our office at [phone] and follow the prompts.  Our office hours are 8:00 a.m. to 4:30 p.m. Monday - Thursday and 8:00 a.m. to 2:30 p.m. Friday.  Please note that voicemails left after 4:00 p.m. may not be returned until the following business day.  We are closed weekends and all major holidays.  You do have access to a nurse 24-7, just call the main number to the clinic [phone] and do not press any options, hold on the line and a nurse will answer the phone.    For prescription refill requests, have your pharmacy contact our office and allow 72 hours.     Masks are optional in the cancer centers. If you would like for your care team to wear a mask while they are taking care of you, please let them know. You may have one support person who is at least 67 years old accompany you for your appointments.

## 2022-02-12 NOTE — Progress Notes (Signed)
[facility] [address], [place] [address]   Patient Care Team: Sharilyn Sites, MD as PCP - General (Family Medicine) Satira Sark, MD as PCP - Cardiology (Cardiology) Danie Binder, MD (Inactive) as Consulting Physician (Gastroenterology) Eloise Harman, DO as Consulting Physician (Internal Medicine)  SUMMARY OF ONCOLOGIC HISTORY: Oncology History  Breast cancer of upper-outer quadrant of left female breast [facility])  11/07/1999 Surgery   Lumpectomy/sentinel LN biopsy invasive ductal carcinoma and DCIS at [facility], 0/6 sentinel LN   11/10/1999 - 01/08/2005 Anti-estrogen oral therapy   Tamoxifen 20 mg daily   11/24/1999 - 01/02/2000 Radiation Therapy     12/18/2015 Mammogram   2D digital screening bilateral mammogram with CAD and adjunct TOMO, in the L breast a possible mass warrants further evaluation. In R breast no findings suspicious for malignancy   12/24/2015 Pathology Results   Invasive ductal carcinoma Grade 3 Left core needle biopsy 1:30 o clock ER- PR- Ki67 70%, HER 2 positive   12/24/2015 Imaging   L breast Ultrasound, notes palpable area of concern demonstrates a hypoechoic irregular mass at 1:30, 7 cm from the nipple measuring 1.3 x 0.9 x 1.4 cm. No evidence of L axillary LAD   01/07/2016 Procedure   Left simple mastectomy by Dr. Georgette [facility]   01/08/2016 Pathology Results   Breast, simple mastectomy, Left - INVASIVE DUCTAL CARCINOMA, GRADE III/III, SPANNING 1.4 CM. - THE SURGICAL RESECTION MARGINS ARE NEGATIVE FOR CARCINOMA.   01/23/2016 Genetic Testing   Patient refused genetic testing at time of consultation with Roma Kayser.  "Despite our recommendation, Ms. Michetti did not wish to pursue genetic testing at today's visit."   01/28/2016 Echocardiogram   Left ventricle: The cavity size was normal. Wall thickness was   normal. Systolic function was normal. The estimated ejection   fraction was in the range of 55% to 60%. Wall motion was  normal;   there were no regional wall motion abnormalities. Doppler   parameters are consistent with abnormal left ventricular   relaxation (grade 1 diastolic dysfunction).   01/31/2016 - 05/15/2016 Chemotherapy   The patient had palonosetron (ALOXI) injection 0.25 mg, 0.25 mg, Intravenous,  Once, 1 of 6 cycles  pegfilgrastim (NEULASTA ONPRO KIT) injection 6 mg, 6 mg, Subcutaneous, Once, 1 of 6 cycles  CARBOplatin (PARAPLATIN) 560 mg in sodium chloride 0.9 % 250 mL chemo infusion, 560 mg (100 % of original dose 558.5 mg), Intravenous,  Once, 1 of 6 cycles Dose modification:   (original dose 558.5 mg, Cycle 1),   (original dose 558.5 mg, Cycle 2)  DOCEtaxel (TAXOTERE) 140 mg in dextrose 5 % 250 mL chemo infusion, 75 mg/m2 = 140 mg, Intravenous,  Once, 1 of 6 cycles  for chemotherapy treatment.     01/31/2016 - 01/01/2017 Antibody Plan   Herceptin x 52 weeks   08/24/2016 Echocardiogram   Left ventricle: The cavity size was normal. Wall thickness was   normal. Systolic function was normal. The estimated ejection   fraction was in the range of 55% to 60%. Wall motion was normal;   there were no regional wall motion abnormalities. Doppler   parameters are consistent with abnormal left ventricular   relaxation (grade 1 diastolic dysfunction).   11/18/2016 Echocardiogram   LV EF: 55% -   60%. - Left ventricle: The cavity size was normal. Wall thickness was   normal. Systolic function was normal. The estimated ejection   fraction was in the range of 55% to 60%.  Wall motion was normal;   there were no regional wall motion abnormalities. Left   ventricular diastolic function parameters were normal. - Mitral valve: There was mild regurgitation. - Tricuspid valve: There was mild regurgitation.     CHIEF COMPLIANT: Follow-up for breast cancer   INTERVAL HISTORY: [name] is a 66 y.o. female here today for follow-up of her left breast cancer.  She denies any new onset pains.  She  has knee pain from arthritis.  Numbness in the fingertips has been stable.    REVIEW OF SYSTEMS:   Review of Systems  Musculoskeletal:  Positive for arthralgias.  Neurological:  Positive for numbness (fingertips).  Psychiatric/Behavioral:  Positive for sleep disturbance.   All other systems reviewed and are negative.   I have reviewed the past medical history, past surgical history, social history and family history with the patient and they are unchanged from previous note.   ALLERGIES:   is allergic to codeine, latex, and naprosyn [naproxen].   MEDICATIONS:  Current Outpatient Medications  Medication Sig Dispense Refill   ALPRAZolam (XANAX) 0.5 MG tablet Take 1 tablet (0.5 mg total) by mouth 2 (two) times daily as needed for anxiety. 60 tablet 1   Cholecalciferol (VITAMIN D3) 2000 units TABS Take 2,000 Units by mouth every morning.      meloxicam (MOBIC) 7.5 MG tablet Take 1 tablet every day by oral route as needed for 30 days.     metoprolol tartrate (LOPRESSOR) 25 MG tablet Take 1 tablet (25 mg total) by mouth 2 (two) times daily. 25 mg in the morning. 12.5 mg in the evening, 60 tablet 0   polyethylene glycol powder (MIRALAX) 17 GM/SCOOP powder Miralax 17 gram oral powder packet  takes 1/2 dose HS     rosuvastatin (CRESTOR) 5 MG tablet Take 5 mg by mouth daily.     No current facility-administered medications for this visit.   Facility-Administered Medications Ordered in Other Visits  Medication Dose Route Frequency Provider Last Rate Last Admin   sodium chloride flush (NS) 0.9 % injection 10 mL  10 mL Intracatheter PRN Penland, Kelby Fam, MD         PHYSICAL EXAMINATION: Performance status (ECOG): 1 - Symptomatic but completely ambulatory  Vitals:   02/12/22 1439  BP: 134/76  Pulse: 79  Resp: 18  Temp: 98.6 F (37 C)  SpO2: 99%   Wt Readings from Last 3 Encounters:  02/12/21 159 lb 6.4 oz (72.3 kg)  07/29/20 166 lb 3.2 oz (75.4 kg)  01/31/20 171 lb 15.3 oz (78  kg)   Physical Exam Vitals reviewed.  Constitutional:      Appearance: Normal appearance.  Cardiovascular:     Rate and Rhythm: Normal rate and regular rhythm.     Pulses: Normal pulses.     Heart sounds: Normal heart sounds.  Pulmonary:     Effort: Pulmonary effort is normal.     Breath sounds: Normal breath sounds.  Chest:  Breasts:    Right: Normal. No inverted nipple, mass, nipple discharge, skin change or tenderness.     Left: Absent. No skin change (mastectomy site within normal limits).  Abdominal:     Palpations: Abdomen is soft. There is no hepatomegaly, splenomegaly or mass.     Tenderness: There is no abdominal tenderness.  Musculoskeletal:     Right lower leg: No edema.     Left lower leg: No edema.  Neurological:     General: No focal deficit present.  Mental Status: She is alert and oriented to person, place, and time.  Psychiatric:        Mood and Affect: Mood normal.        Behavior: Behavior normal.     Breast Exam Chaperone: Thana Ates     LABORATORY DATA:  I have reviewed the data as listed    Latest Ref Rng & Units 02/05/2022   11:21 AM 02/05/2021   10:29 AM 01/24/2020    2:06 PM  CMP  Glucose 70 - 99 mg/dL 100  102  91   BUN 8 - 23 mg/dL _0 Creatinine 0.44 - 1.00 mg/dL 0.62  0.63  0.74   Sodium 135 - 145 mmol/L 139  140  137   Potassium 3.5 - 5.1 mmol/L 4.4  5.0  4.3   Chloride 98 - 111 mmol/L 104  103  102   CO2 22 - 32 mmol/L _1 Calcium 8.9 - 10.3 mg/dL 9.7  10.0  9.6   Total Protein 6.5 - 8.1 g/dL 7.6  8.1  7.5   Total Bilirubin 0.3 - 1.2 mg/dL 0.9  0.7  0.7   Alkaline Phos 38 - 126 U/L 64  74  62   AST 15 - 41 U/L 33  26  27   ALT 0 - 44 U/L _2 No results found for: "ION629" Lab Results  Component Value Date   WBC 5.0 02/05/2022   HGB 15.2 (H) 02/05/2022   HCT 46.2 (H) 02/05/2022   MCV 94.9 02/05/2022   PLT 245 02/05/2022   NEUTROABS 1.9 02/05/2022    ASSESSMENT:  1.  Stage Ia left breast  invasive ductal carcinoma: - Patient was diagnosed with invasive ductal carcinoma, ER negative/PR negative/HER-2 positive. -Patient is status post mastectomy and Taxotere\carbo\Herceptin x6 cycles; maintenance Herceptin every 3 weeks for 1 year, she completed that on 01/01/2017. -She did not require adjuvant radiation therapy.  Patient was not recommended for adjuvant antiestrogen therapy given ER negative. -Last echo on 11/18/2016 was normal with a EF of 55 to 60%. - Last mammogram done on 01/23/2020 was BI-RADS Category 1 negative.    PLAN:  1.  Stage Ia left breast invasive ductal carcinoma: - Physical examination shows left mastectomy site is within normal limits with no suspicious masses.  Right breast has no palpable masses.  No palpable adenopathy. - Labs from 02/05/2022 shows normal LFTs and CBC.  Hemoglobin is slightly elevated but improved from last time.  Vitamin D was normal. - Reviewed right breast mammogram from 01/26/2022, BI-RADS Category 1. - Recommend follow-up in 1 year with repeat mammogram and labs.  Breast Cancer therapy associated bone loss: I have recommended calcium, Vitamin D and weight bearing exercises.  Orders placed this encounter:  Orders Placed This Encounter  Procedures   MM 3D SCREEN BREAST UNI RIGHT   CBC with Differential/Platelet   Comprehensive metabolic panel   VITAMIN D 25 Hydroxy (Vit-D Deficiency, Fractures)     The patient has a good understanding of the overall plan. She agrees with it. She will call with any problems that may develop before the next visit here.  Derek Jack, MD [place] [phone]

## 2022-02-25 DIAGNOSIS — Z6826 Body mass index (BMI) 26.0-26.9, adult: Secondary | ICD-10-CM | POA: Diagnosis not present

## 2022-02-25 DIAGNOSIS — E663 Overweight: Secondary | ICD-10-CM | POA: Diagnosis not present

## 2022-02-25 DIAGNOSIS — J069 Acute upper respiratory infection, unspecified: Secondary | ICD-10-CM | POA: Diagnosis not present

## 2022-03-04 DIAGNOSIS — H43812 Vitreous degeneration, left eye: Secondary | ICD-10-CM | POA: Diagnosis not present

## 2022-03-13 ENCOUNTER — Ambulatory Visit (HOSPITAL_COMMUNITY)
Admission: RE | Admit: 2022-03-13 | Discharge: 2022-03-13 | Disposition: A | Discharge status: Home / Self Care | Payer: PPO | Source: Ambulatory Visit | Attending: Family Medicine | Admitting: Family Medicine

## 2022-03-13 ENCOUNTER — Other Ambulatory Visit (HOSPITAL_COMMUNITY): Payer: Self-pay | Admitting: Family Medicine

## 2022-03-13 DIAGNOSIS — Z6826 Body mass index (BMI) 26.0-26.9, adult: Secondary | ICD-10-CM | POA: Diagnosis not present

## 2022-03-13 DIAGNOSIS — R059 Cough, unspecified: Secondary | ICD-10-CM | POA: Insufficient documentation

## 2022-03-13 DIAGNOSIS — C50919 Malignant neoplasm of unspecified site of unspecified female breast: Secondary | ICD-10-CM | POA: Diagnosis not present

## 2022-03-13 DIAGNOSIS — R0602 Shortness of breath: Secondary | ICD-10-CM | POA: Diagnosis not present

## 2022-03-13 DIAGNOSIS — E663 Overweight: Secondary | ICD-10-CM | POA: Diagnosis not present

## 2022-03-13 DIAGNOSIS — J069 Acute upper respiratory infection, unspecified: Secondary | ICD-10-CM | POA: Diagnosis not present

## 2022-04-10 DIAGNOSIS — H43813 Vitreous degeneration, bilateral: Secondary | ICD-10-CM | POA: Diagnosis not present

## 2022-05-21 DIAGNOSIS — M25561 Pain in right knee: Secondary | ICD-10-CM | POA: Diagnosis not present

## 2022-05-21 DIAGNOSIS — M7061 Trochanteric bursitis, right hip: Secondary | ICD-10-CM | POA: Diagnosis not present

## 2022-06-10 DIAGNOSIS — K219 Gastro-esophageal reflux disease without esophagitis: Secondary | ICD-10-CM | POA: Diagnosis not present

## 2022-06-10 DIAGNOSIS — E063 Autoimmune thyroiditis: Secondary | ICD-10-CM | POA: Diagnosis not present

## 2022-06-10 DIAGNOSIS — F419 Anxiety disorder, unspecified: Secondary | ICD-10-CM | POA: Diagnosis not present

## 2022-06-10 DIAGNOSIS — Z6827 Body mass index (BMI) 27.0-27.9, adult: Secondary | ICD-10-CM | POA: Diagnosis not present

## 2022-06-10 DIAGNOSIS — Z01818 Encounter for other preprocedural examination: Secondary | ICD-10-CM | POA: Diagnosis not present

## 2022-06-10 DIAGNOSIS — E782 Mixed hyperlipidemia: Secondary | ICD-10-CM | POA: Diagnosis not present

## 2022-06-10 DIAGNOSIS — E119 Type 2 diabetes mellitus without complications: Secondary | ICD-10-CM | POA: Diagnosis not present

## 2022-06-10 DIAGNOSIS — M797 Fibromyalgia: Secondary | ICD-10-CM | POA: Diagnosis not present

## 2022-06-10 DIAGNOSIS — E663 Overweight: Secondary | ICD-10-CM | POA: Diagnosis not present

## 2022-06-10 DIAGNOSIS — E7849 Other hyperlipidemia: Secondary | ICD-10-CM | POA: Diagnosis not present

## 2022-06-10 DIAGNOSIS — E049 Nontoxic goiter, unspecified: Secondary | ICD-10-CM | POA: Diagnosis not present

## 2022-06-10 DIAGNOSIS — G2581 Restless legs syndrome: Secondary | ICD-10-CM | POA: Diagnosis not present

## 2022-06-16 NOTE — Progress Notes (Signed)
Surgery orders requested via Epic inbox. °

## 2022-06-17 ENCOUNTER — Other Ambulatory Visit (HOSPITAL_COMMUNITY): Payer: Self-pay | Admitting: Family Medicine

## 2022-06-17 ENCOUNTER — Ambulatory Visit (HOSPITAL_COMMUNITY)
Admission: RE | Admit: 2022-06-17 | Discharge: 2022-06-17 | Disposition: A | Discharge status: Home / Self Care | Payer: PPO | Source: Ambulatory Visit | Attending: Family Medicine | Admitting: Family Medicine

## 2022-06-17 ENCOUNTER — Ambulatory Visit: Payer: Self-pay | Admitting: Orthopedic Surgery

## 2022-06-17 DIAGNOSIS — Z01818 Encounter for other preprocedural examination: Secondary | ICD-10-CM | POA: Insufficient documentation

## 2022-06-17 DIAGNOSIS — M79652 Pain in left thigh: Secondary | ICD-10-CM | POA: Diagnosis not present

## 2022-06-17 DIAGNOSIS — M79605 Pain in left leg: Secondary | ICD-10-CM | POA: Diagnosis not present

## 2022-06-17 DIAGNOSIS — E663 Overweight: Secondary | ICD-10-CM | POA: Diagnosis not present

## 2022-06-17 DIAGNOSIS — Z6827 Body mass index (BMI) 27.0-27.9, adult: Secondary | ICD-10-CM | POA: Diagnosis not present

## 2022-06-17 DIAGNOSIS — R600 Localized edema: Secondary | ICD-10-CM | POA: Diagnosis not present

## 2022-06-17 NOTE — H&P (View-Only) (Signed)
[name] is an 67 y.o. female.   Chief Complaint: Right knee pain HPI: Visit For: Follow up Location: right; knee Duration: 5 months; 12/31/21 Severity: pain level 5/10 Timing: chronic Associated Symptoms: swelling; popping/clicking Prior Imaging: x ray; MRI Notes: The patient would like to discuss a knee arthroscopy patient developing hip pain as well. Nonradiating worse when she gets up out of a seat. Still symptomatic in the knees she would like to discuss knee arthroscopy  Past Medical History:  Diagnosis Date   Anxiety    Arthritis    Bulging of cervical intervertebral disc    Dental crowns present    Depression    Fibromyalgia    GERD (gastroesophageal reflux disease)    History of chemotherapy 2017   Docetaxel, Carboplatin, Herceptin   History of left breast cancer 01/2016   Multiple thyroid nodules    Palpitations     Past Surgical History:  Procedure Laterality Date   ABDOMINAL HYSTERECTOMY  04/2017   complete   COLONOSCOPY N/A 03/09/2017   Procedure: COLONOSCOPY;  Surgeon: Danie Binder, MD;  [facility];  Service: Endoscopy;  Laterality: N/A;  2:45pm   LAPAROSCOPIC APPENDECTOMY N/A 11/10/2012   Procedure: APPENDECTOMY LAPAROSCOPIC;  Surgeon: Jamesetta So, MD;  Location: [facility];  Service: General;  Laterality: N/A;   MASTECTOMY Left 01/07/2016   breast cancer   MASTECTOMY W/ SENTINEL NODE BIOPSY Left 01/07/2016   Procedure: LEFT TOTAL MASTECTOMY WITH LEFT AXILLARY SENTINEL LYMPH NODE BIOPSY;  Surgeon: Donnie Mesa, MD;  Location: [place];  Service: General;  Laterality: Left;   PORT-A-CATH REMOVAL N/A 09/07/2017   Procedure: REMOVAL PORT-A-CATH;  Surgeon: Donnie Mesa, MD;  Location: [place];  Service: General;  Laterality: N/A;   PORTACATH PLACEMENT Right 01/07/2016   Procedure: INSERTION PORT-A-CATH RIGHT INTERNAL JUGULAR WITH ULTRASOUND;  Surgeon: Donnie Mesa, MD;  Location: [place];  Service: General;  Laterality: Right;     Family History  Problem Relation Age of Onset   Colon cancer Mother 77       Deceased secondary to metastatic cancer   Diabetes Father    Diabetes Sister    Alcohol abuse Brother    Lung cancer Brother 77   Cancer Brother        Penile   Breast cancer Maternal Aunt    Cancer Other    Social History:  reports that she has never smoked. She has never used smokeless tobacco. She reports that she does not drink alcohol and does not use drugs.  Allergies:  Allergies  Allergen Reactions   Codeine Nausea And Vomiting   Latex Itching and Rash   Naprosyn [Naproxen] Itching and Rash   Current meds: albuterol sulfate HFA 90 mcg/actuation aerosol inhaler ALPRAZolam 0.5 mg tablet meloxicam 7.5 mg tablet metoprolol tartrate 25 mg tablet omeprazole 20 mg capsule,delayed release rosuvastatin 5 mg tablet Vitamin D3  Review of Systems  Constitutional: Negative.   HENT: Negative.    Eyes: Negative.   Respiratory: Negative.    Cardiovascular: Negative.   Gastrointestinal: Negative.   Endocrine: Negative.   Genitourinary: Negative.   Musculoskeletal:  Positive for arthralgias, gait problem and joint swelling.  Skin: Negative.   Psychiatric/Behavioral: Negative.      There were no vitals taken for this visit. Physical Exam Constitutional:      Appearance: Normal appearance.  HENT:     Head: Normocephalic and atraumatic.     Right Ear: External ear normal.  Left Ear: External ear normal.     Nose: Nose normal.     Mouth/Throat:     Pharynx: Oropharynx is clear.  Eyes:     Conjunctiva/sclera: Conjunctivae normal.  Cardiovascular:     Rate and Rhythm: Normal rate.     Pulses: Normal pulses.     Heart sounds: Normal heart sounds.  Pulmonary:     Effort: Pulmonary effort is normal.  Abdominal:     General: Bowel sounds are normal.  Musculoskeletal:     Cervical back: Normal range of motion.     Comments: Right knee she tender medial joint line. Equivocal McMurray  moderate effusion ranges 0-1 40. No DVT  Skin:    General: Skin is warm and dry.  Neurological:     Mental Status: She is alert.    MRI of the right knee demonstrates nondisplaced tear in the posterior horn the medial meniscus diffuse full-thickness cartilage loss on the central weightbearing medial femoral condyle. And medial tibial plateau mild patella alta. Mild tendinosis of the patellar tendon.  X-rays demonstrate moderately severe medial joint space narrowing.  Assessment/Plan Impression:  1. Patient was symptomatic medial compartment arthrosis areas of full-thickness chondral loss in the tibia and femur weightbearing surface underlying meniscus tear currently without mechanical symptoms 2. Trochanteric bursitis right hip  Plan:  I discussed the risk and benefits of knee arthroscopy including no changes in their symptoms worsening in their symptoms DVT, PE, anesthetic complications etc. Surgical possibilities include chondroplasty, microfracture, partial meniscectomy, plica excision etc. We also discussed the possible need for repeat arthroscopy in the future as well as possible continued treatment including corticosteroid injections and possible Visco supplementation. In addition we discussed the possibility of even eventually required a total knee replacement if significant arthritis encountered. Also indicating that it is an outpatient procedure. 1-2 days on crutches. Postoperative DVT prophylaxis with aspirin if tolerated. Follow-up in the office in 2 weeks following the surgery. Possible consideration of formal of supervised physical therapy as well.  No history of DVT or PE. Kefzol and oxycodone.  Proceeded with the injection over the right trochanter after sterile prep which she tolerated well.  Home stretches.  Plan R knee scope, partial medial meniscectomy, debridement  Cecilie Kicks, PA-C for Dr Tonita Cong 06/17/2022, 8:24 AM

## 2022-06-17 NOTE — H&P (Signed)
[name] is an 67 y.o. female.   Chief Complaint: Right knee pain HPI: Visit For: Follow up Location: right; knee Duration: 5 months; 12/31/21 Severity: pain level 5/10 Timing: chronic Associated Symptoms: swelling; popping/clicking Prior Imaging: x ray; MRI Notes: The patient would like to discuss a knee arthroscopy patient developing hip pain as well. Nonradiating worse when she gets up out of a seat. Still symptomatic in the knees she would like to discuss knee arthroscopy  Past Medical History:  Diagnosis Date   Anxiety    Arthritis    Bulging of cervical intervertebral disc    Dental crowns present    Depression    Fibromyalgia    GERD (gastroesophageal reflux disease)    History of chemotherapy 2017   Docetaxel, Carboplatin, Herceptin   History of left breast cancer 01/2016   Multiple thyroid nodules    Palpitations     Past Surgical History:  Procedure Laterality Date   ABDOMINAL HYSTERECTOMY  04/2017   complete   COLONOSCOPY N/A 03/09/2017   Procedure: COLONOSCOPY;  Surgeon: Danie Binder, MD;  [facility];  Service: Endoscopy;  Laterality: N/A;  2:45pm   LAPAROSCOPIC APPENDECTOMY N/A 11/10/2012   Procedure: APPENDECTOMY LAPAROSCOPIC;  Surgeon: Jamesetta So, MD;  [facility];  Service: General;  Laterality: N/A;   MASTECTOMY Left 01/07/2016   breast cancer   MASTECTOMY W/ SENTINEL NODE BIOPSY Left 01/07/2016   Procedure: LEFT TOTAL MASTECTOMY WITH LEFT AXILLARY SENTINEL LYMPH NODE BIOPSY;  Surgeon: Donnie Mesa, MD;  Location: [place];  Service: General;  Laterality: Left;   PORT-A-CATH REMOVAL N/A 09/07/2017   Procedure: REMOVAL PORT-A-CATH;  Surgeon: Donnie Mesa, MD;  Location: [place];  Service: General;  Laterality: N/A;   PORTACATH PLACEMENT Right 01/07/2016   Procedure: INSERTION PORT-A-CATH RIGHT INTERNAL JUGULAR WITH ULTRASOUND;  Surgeon: Donnie Mesa, MD;  Location: [place];  Service: General;  Laterality: Right;     Family History  Problem Relation Age of Onset   Colon cancer Mother 27       Deceased secondary to metastatic cancer   Diabetes Father    Diabetes Sister    Alcohol abuse Brother    Lung cancer Brother 54   Cancer Brother        Penile   Breast cancer Maternal Aunt    Cancer Other    Social History:  reports that she has never smoked. She has never used smokeless tobacco. She reports that she does not drink alcohol and does not use drugs.  Allergies:  Allergies  Allergen Reactions   Codeine Nausea And Vomiting   Latex Itching and Rash   Naprosyn [Naproxen] Itching and Rash   Current meds: albuterol sulfate HFA 90 mcg/actuation aerosol inhaler ALPRAZolam 0.5 mg tablet meloxicam 7.5 mg tablet metoprolol tartrate 25 mg tablet omeprazole 20 mg capsule,delayed release rosuvastatin 5 mg tablet Vitamin D3  Review of Systems  Constitutional: Negative.   HENT: Negative.    Eyes: Negative.   Respiratory: Negative.    Cardiovascular: Negative.   Gastrointestinal: Negative.   Endocrine: Negative.   Genitourinary: Negative.   Musculoskeletal:  Positive for arthralgias, gait problem and joint swelling.  Skin: Negative.   Psychiatric/Behavioral: Negative.      There were no vitals taken for this visit. Physical Exam Constitutional:      Appearance: Normal appearance.  HENT:     Head: Normocephalic and atraumatic.     Right Ear: External ear normal.  Left Ear: External ear normal.     Nose: Nose normal.     Mouth/Throat:     Pharynx: Oropharynx is clear.  Eyes:     Conjunctiva/sclera: Conjunctivae normal.  Cardiovascular:     Rate and Rhythm: Normal rate.     Pulses: Normal pulses.     Heart sounds: Normal heart sounds.  Pulmonary:     Effort: Pulmonary effort is normal.  Abdominal:     General: Bowel sounds are normal.  Musculoskeletal:     Cervical back: Normal range of motion.     Comments: Right knee she tender medial joint line. Equivocal McMurray  moderate effusion ranges 0-1 40. No DVT  Skin:    General: Skin is warm and dry.  Neurological:     Mental Status: She is alert.    MRI of the right knee demonstrates nondisplaced tear in the posterior horn the medial meniscus diffuse full-thickness cartilage loss on the central weightbearing medial femoral condyle. And medial tibial plateau mild patella alta. Mild tendinosis of the patellar tendon.  X-rays demonstrate moderately severe medial joint space narrowing.  Assessment/Plan Impression:  1. Patient was symptomatic medial compartment arthrosis areas of full-thickness chondral loss in the tibia and femur weightbearing surface underlying meniscus tear currently without mechanical symptoms 2. Trochanteric bursitis right hip  Plan:  I discussed the risk and benefits of knee arthroscopy including no changes in their symptoms worsening in their symptoms DVT, PE, anesthetic complications etc. Surgical possibilities include chondroplasty, microfracture, partial meniscectomy, plica excision etc. We also discussed the possible need for repeat arthroscopy in the future as well as possible continued treatment including corticosteroid injections and possible Visco supplementation. In addition we discussed the possibility of even eventually required a total knee replacement if significant arthritis encountered. Also indicating that it is an outpatient procedure. 1-2 days on crutches. Postoperative DVT prophylaxis with aspirin if tolerated. Follow-up in the office in 2 weeks following the surgery. Possible consideration of formal of supervised physical therapy as well.  No history of DVT or PE. Kefzol and oxycodone.  Proceeded with the injection over the right trochanter after sterile prep which she tolerated well.  Home stretches.  Plan R knee scope, partial medial meniscectomy, debridement  Cecilie Kicks, PA-C for Dr Tonita Cong 06/17/2022, 8:24 AM

## 2022-06-19 NOTE — Progress Notes (Signed)
PCP -  Cardiologist - [facility] 07-09-20 Rozann Lesches, MD epic  PPM/ICD -  Device Orders -  Rep Notified -   Chest x-ray - 03-13-22 epic EKG -  Stress Test -  ECHO - 2022 epic Cardiac Cath -   Sleep Study -  CPAP -   Fasting Blood Sugar -  Checks Blood Sugar _____ times a day  Blood Thinner Instructions: Aspirin Instructions:  ERAS Protcol - PRE-SURGERY Ensure or G2-   COVID TEST-  COVID vaccine -  Activity-- Anesthesia review:   Patient denies shortness of breath, fever, cough and chest pain at PAT appointment   All instructions explained to the patient, with a verbal understanding of the material. Patient agrees to go over the instructions while at home for a better understanding. Patient also instructed to self quarantine after being tested for COVID-19. The opportunity to ask questions was provided.

## 2022-06-19 NOTE — Patient Instructions (Signed)
SURGICAL WAITING ROOM VISITATION  Patients having surgery or a procedure may have no more than 2 support people in the waiting area - these visitors may rotate.    Children under the age of 89 must have an adult with them who is not the patient.  Due to an increase in RSV and influenza rates and associated hospitalizations, children ages 27 and under may not visit patients in [place].  If the patient needs to stay at the hospital during part of their recovery, the visitor guidelines for inpatient rooms apply. Pre-op nurse will coordinate an appropriate time for 1 support person to accompany patient in pre-op.  This support person may not rotate.    Please refer to the [facility] website for the visitor guidelines for Inpatients (after your surgery is over and you are in a regular room).       Your procedure is scheduled on: 07-01-22   Report to [facility] Main Entrance    Report to admitting at     5:15  AM   Call this number if you have problems the morning of surgery [phone]   Do not eat food :After Midnight.   After Midnight you may have the following liquids until 4:30 AM DAY OF SURGERY  Water Non-Citrus Juices (without pulp, NO RED-Apple, White grape, White cranberry) Black Coffee (NO MILK/CREAM OR CREAMERS, sugar ok)  Clear Tea (NO MILK/CREAM OR CREAMERS, sugar ok) regular and decaf                             Plain Jell-O (NO RED)                                           Fruit ices (not with fruit pulp, NO RED)                                     Popsicles (NO RED)                                                               Sports drinks like Gatorade (NO RED)                   The day of surgery:  Drink ONE (1) Pre-Surgery Clear Ensure at 4:30 AM the morning of surgery. Drink in one sitting. Do not sip.  This drink was given to you during your hospital  pre-op appointment visit. Nothing else to drink after completing the  Pre-Surgery  Clear Ensure by 0430 am          If you have questions, please contact your surgeon's office.   FOLLOW ANY ADDITIONAL PRE OP INSTRUCTIONS YOU RECEIVED FROM YOUR SURGEON'S OFFICE!!!     Oral Hygiene is also important to reduce your risk of infection.                                    Remember - BRUSH YOUR TEETH THE  MORNING OF SURGERY WITH YOUR REGULAR TOOTHPASTE  DENTURES WILL BE REMOVED PRIOR TO SURGERY PLEASE DO NOT APPLY "Poly grip" OR ADHESIVES!!!   Do NOT smoke after Midnight   Take these medicines the morning of surgery with A SIP OF WATER:  Omeprazole Metoprolol  Xanax if needed                              You may not have any metal on your body including hair pins, jewelry, and body piercing             Do not wear make-up, lotions, powders, perfumes or deodorant  Do not wear nail polish including gel and S&S, artificial/acrylic nails, or any other type of covering on natural nails including finger and toenails. If you have artificial nails, gel coating, etc. that needs to be removed by a nail salon please have this removed prior to surgery or surgery may need to be canceled/ delayed if the surgeon/ anesthesia feels like they are unable to be safely monitored.   Do not shave  48 hours prior to surgery.       Do not bring valuables to the hospital. [place].   Contacts, glasses, dentures or bridgework may not be worn into surgery.  DO NOT [place]. PHARMACY WILL DISPENSE MEDICATIONS LISTED ON YOUR MEDICATION LIST TO YOU DURING YOUR ADMISSION [place]!    Patients discharged on the day of surgery will not be allowed to drive home.  Someone NEEDS to stay with you for the first 24 hours after anesthesia.              Please read over the following fact sheets you were given: IF [place]  If you received a COVID  test during your pre-op visit  it is requested that you wear a mask when out in public, stay away from anyone that may not be feeling well and notify your surgeon if you develop symptoms. If you test positive for Covid or have been in contact with anyone that has tested positive in the last 10 days please notify you surgeon.    [place] - Preparing for Surgery Before surgery, you can play an important role.  Because skin is not sterile, your skin needs to be as free of germs as possible.  You can reduce the number of germs on your skin by washing with CHG (chlorahexidine gluconate) soap before surgery.  CHG is an antiseptic cleaner which kills germs and bonds with the skin to continue killing germs even after washing. Please DO NOT use if you have an allergy to CHG or antibacterial soaps.  If your skin becomes reddened/irritated stop using the CHG and inform your nurse when you arrive at Short Stay. Do not shave (including legs and underarms) for at least 48 hours prior to the first CHG shower.  You may shave your face/neck. Please follow these instructions carefully:  1.  Shower with CHG Soap the night before surgery and the  morning of Surgery.  2.  If you choose to wash your hair, wash your hair first as usual with your  normal  shampoo.  3.  After you shampoo, rinse your hair and body thoroughly to remove the  shampoo.                           4.  Use CHG as you would any other liquid soap.  You can apply chg directly  to the skin and wash                       Gently with a scrungie or clean washcloth.  5.  Apply the CHG Soap to your body ONLY FROM THE NECK DOWN.   Do not use on face/ open                           Wound or open sores. Avoid contact with eyes, ears mouth and genitals (private parts).                       Wash face,  Genitals (private parts) with your normal soap.             6.  Wash thoroughly, paying special attention to the area where your surgery  will be performed.  7.   Thoroughly rinse your body with warm water from the neck down.  8.  DO NOT shower/wash with your normal soap after using and rinsing off  the CHG Soap.                9.  Pat yourself dry with a clean towel.            10.  Wear clean pajamas.            11.  Place clean sheets on your bed the night of your first shower and do not  sleep with pets. Day of Surgery : Do not apply any lotions/deodorants the morning of surgery.  Please wear clean clothes to the hospital/surgery center.  FAILURE TO FOLLOW THESE INSTRUCTIONS MAY RESULT IN THE CANCELLATION OF YOUR SURGERY PATIENT SIGNATURE_________________________________  NURSE SIGNATURE__________________________________  ________________________________________________________________________  Adam Phenix  An incentive spirometer is a tool that can help keep your lungs clear and active. This tool measures how well you are filling your lungs with each breath. Taking long deep breaths may help reverse or decrease the chance of developing breathing (pulmonary) problems (especially infection) following: A long period of time when you are unable to move or be active. BEFORE THE PROCEDURE  If the spirometer includes an indicator to show your best effort, your nurse or respiratory therapist will set it to a desired goal. If possible, sit up straight or lean slightly forward. Try not to slouch. Hold the incentive spirometer in an upright position. INSTRUCTIONS FOR USE  Sit on the edge of your bed if possible, or sit up as far as you can in bed or on a chair. Hold the incentive spirometer in an upright position. Breathe out normally. Place the mouthpiece in your mouth and seal your lips tightly around it. Breathe in slowly and as deeply as possible, raising the piston or the ball toward the top of the column. Hold your breath for 3-5 seconds or for as long as possible. Allow the piston or ball to fall to the bottom of the column. Remove the  mouthpiece from your mouth and breathe out normally. Rest for a few seconds and repeat Steps 1 through 7 at least 10 times every 1-2 hours when you are awake. Take your time and take a  few normal breaths between deep breaths. The spirometer may include an indicator to show your best effort. Use the indicator as a goal to work toward during each repetition. After each set of 10 deep breaths, practice coughing to be sure your lungs are clear. If you have an incision (the cut made at the time of surgery), support your incision when coughing by placing a pillow or rolled up towels firmly against it. Once you are able to get out of bed, walk around indoors and cough well. You may stop using the incentive spirometer when instructed by your caregiver.  RISKS AND COMPLICATIONS Take your time so you do not get dizzy or light-headed. If you are in pain, you may need to take or ask for pain medication before doing incentive spirometry. It is harder to take a deep breath if you are having pain. AFTER USE Rest and breathe slowly and easily. It can be helpful to keep track of a log of your progress. Your caregiver can provide you with a simple table to help with this. If you are using the spirometer at home, follow these instructions: [place] IF:  You are having difficultly using the spirometer. You have trouble using the spirometer as often as instructed. Your pain medication is not giving enough relief while using the spirometer. You develop fever of 100.5 F (38.1 C) or higher. SEEK IMMEDIATE MEDICAL CARE IF:  You cough up bloody sputum that had not been present before. You develop fever of 102 F (38.9 C) or greater. You develop worsening pain at or near the incision site. MAKE SURE YOU:  Understand these instructions. Will watch your condition. Will get help right away if you are not doing well or get worse. Document Released: 08/31/2006 Document Revised: 07/13/2011 Document Reviewed:  11/01/2006 [facility] Patient Information 2014 [place], [place].   ________________________________________________________________________

## 2022-06-23 ENCOUNTER — Other Ambulatory Visit: Payer: Self-pay

## 2022-06-23 ENCOUNTER — Encounter (HOSPITAL_COMMUNITY)
Admission: RE | Admit: 2022-06-23 | Discharge: 2022-06-23 | Disposition: A | Discharge status: Home / Self Care | Payer: PPO | Source: Ambulatory Visit | Attending: Specialist | Admitting: Specialist

## 2022-06-23 ENCOUNTER — Encounter (HOSPITAL_COMMUNITY): Payer: Self-pay

## 2022-06-23 VITALS — BP 138/79 | HR 74 | Temp 98.4°F | Resp 16 | Ht 66.0 in | Wt 167.0 lb

## 2022-06-23 DIAGNOSIS — Z01810 Encounter for preprocedural cardiovascular examination: Secondary | ICD-10-CM | POA: Diagnosis not present

## 2022-06-23 DIAGNOSIS — Z01818 Encounter for other preprocedural examination: Secondary | ICD-10-CM

## 2022-06-23 HISTORY — DX: Anemia, unspecified: D64.9

## 2022-06-23 NOTE — Progress Notes (Addendum)
COVID Vaccine Completed:   Yes x2  Date of COVID positive in last 90 days:  No  PCP - Sharilyn Sites, MD Cardiologist - Rozann Lesches, MD  Chest x-ray - 03-13-22 Epic EKG - 06-23-22 Epic Stress Test - N/A ECHO - 08-20-20 Epic Cardiac Cath - N/A Pacemaker/ICD device last checked: Spinal Cord Stimulator:  N/A  Bowel Prep - N/A  Sleep Study - N/A CPAP -   Fasting Blood Sugar - NA Checks Blood Sugar _____ times a day  Last dose of GLP1 agonist-  N/A GLP1 instructions:  N/A   Last dose of SGLT-2 inhibitors-  N/A SGLT-2 instructions: N/A  Blood Thinner Instructions:  N/A Aspirin Instructions: Last Dose:  Activity level:  Can go up a flight of stairs and perform activities of daily living without stopping and without symptoms of chest pain or shortness of breath.  Anesthesia review:  Hx of palpitations evaluated by cardiology.  Patient states the she continues to have palpitations.  Stated that when she was able to exercise they were decreased.  Inactive now due to knee pain with recurrent palpitations.  Patient denies shortness of breath, fever, cough and chest pain at PAT appointment  Patient verbalized understanding of instructions that were given to them at the PAT appointment. Patient was also instructed that they will need to review over the PAT instructions again at home before surgery.

## 2022-06-25 NOTE — Progress Notes (Signed)
Labs from PCP 06-10-22 requested for chart for surgery CMP CBC/DIFF

## 2022-06-30 NOTE — Anesthesia Preprocedure Evaluation (Signed)
Anesthesia Evaluation  Patient identified by MRN, date of birth, ID band Patient awake    Reviewed: Allergy & Precautions, NPO status , Patient's Chart, lab work & pertinent test results, reviewed documented beta blocker date and time   History of Anesthesia Complications Negative for: history of anesthetic complications  Airway Mallampati: III  TM Distance: >3 FB Neck ROM: Full   Comment: Previous grade I view with Miller 2, mask with OPA Dental  (+) Dental Advisory Given   Pulmonary neg pulmonary ROS   Pulmonary exam normal breath sounds clear to auscultation       Cardiovascular (-) hypertension(-) angina (-) Past MI and (-) Cardiac Stents + dysrhythmias (palpitations)  Rhythm:Regular Rate:Normal  HLD  TTE 08/20/2020: IMPRESSIONS     1. Left ventricular ejection fraction, by estimation, is 60 to 65%. The  left ventricle has normal function. The left ventricle has no regional  wall motion abnormalities. Left ventricular diastolic parameters are  consistent with Grade I diastolic  dysfunction (impaired relaxation).   2. Right ventricular systolic function is normal. The right ventricular  size is normal. There is normal pulmonary artery systolic pressure.   3. The mitral valve is normal in structure. No evidence of mitral valve  regurgitation. No evidence of mitral stenosis.   4. The aortic valve is tricuspid. There is mild calcification of the  aortic valve. There is mild thickening of the aortic valve. Aortic valve  regurgitation is not visualized. No aortic stenosis is present.   5. The inferior vena cava is normal in size with greater than 50%  respiratory variability, suggesting right atrial pressure of 3 mmHg.     Neuro/Psych neg Seizures PSYCHIATRIC DISORDERS Anxiety Depression    Bulging of cervical disc    GI/Hepatic Neg liver ROS,GERD  Medicated,,  Endo/Other  neg diabetes  Multiple thyroid nodules   Renal/GU negative Renal ROS     Musculoskeletal  (+) Arthritis ,  Fibromyalgia -  Abdominal   Peds  Hematology  (+) Blood dyscrasia, anemia   Anesthesia Other Findings H/o left breast ccancer  Reproductive/Obstetrics                             Anesthesia Physical Anesthesia Plan  ASA: 3  Anesthesia Plan: General   Post-op Pain Management: Tylenol PO (pre-op)*   Induction: Intravenous  PONV Risk Score and Plan: 3 and Ondansetron, Dexamethasone and Treatment may vary due to age or medical condition  Airway Management Planned: LMA  Additional Equipment:   Intra-op Plan:   Post-operative Plan: Extubation in OR  Informed Consent: I have reviewed the patients History and Physical, chart, labs and discussed the procedure including the risks, benefits and alternatives for the proposed anesthesia with the patient or authorized representative who has indicated his/her understanding and acceptance.     Dental advisory given  Plan Discussed with: CRNA and Anesthesiologist  Anesthesia Plan Comments: (Risks of general anesthesia discussed including, but not limited to, sore throat, hoarse voice, chipped/damaged teeth, injury to vocal cords, nausea and vomiting, allergic reactions, lung infection, heart attack, stroke, and death. All questions answered. )        Anesthesia Quick Evaluation

## 2022-07-01 ENCOUNTER — Other Ambulatory Visit: Payer: Self-pay

## 2022-07-01 ENCOUNTER — Ambulatory Visit (HOSPITAL_BASED_OUTPATIENT_CLINIC_OR_DEPARTMENT_OTHER): Payer: PPO | Admitting: Anesthesiology

## 2022-07-01 ENCOUNTER — Encounter (HOSPITAL_COMMUNITY)
Admission: RE | Disposition: A | Discharge status: Home / Self Care | Payer: Self-pay | Source: Ambulatory Visit | Attending: Specialist

## 2022-07-01 ENCOUNTER — Ambulatory Visit (HOSPITAL_COMMUNITY)
Admission: RE | Admit: 2022-07-01 | Discharge: 2022-07-01 | Disposition: A | Discharge status: Home / Self Care | Payer: PPO | Source: Ambulatory Visit | Attending: Specialist | Admitting: Specialist

## 2022-07-01 ENCOUNTER — Encounter (HOSPITAL_COMMUNITY): Payer: Self-pay | Admitting: Specialist

## 2022-07-01 ENCOUNTER — Ambulatory Visit (HOSPITAL_COMMUNITY): Payer: PPO | Admitting: Physician Assistant

## 2022-07-01 DIAGNOSIS — K219 Gastro-esophageal reflux disease without esophagitis: Secondary | ICD-10-CM | POA: Insufficient documentation

## 2022-07-01 DIAGNOSIS — E785 Hyperlipidemia, unspecified: Secondary | ICD-10-CM | POA: Insufficient documentation

## 2022-07-01 DIAGNOSIS — S83231A Complex tear of medial meniscus, current injury, right knee, initial encounter: Secondary | ICD-10-CM | POA: Insufficient documentation

## 2022-07-01 DIAGNOSIS — S83281A Other tear of lateral meniscus, current injury, right knee, initial encounter: Secondary | ICD-10-CM

## 2022-07-01 DIAGNOSIS — F418 Other specified anxiety disorders: Secondary | ICD-10-CM

## 2022-07-01 DIAGNOSIS — M1711 Unilateral primary osteoarthritis, right knee: Secondary | ICD-10-CM | POA: Diagnosis not present

## 2022-07-01 DIAGNOSIS — S83241A Other tear of medial meniscus, current injury, right knee, initial encounter: Secondary | ICD-10-CM | POA: Diagnosis not present

## 2022-07-01 DIAGNOSIS — F419 Anxiety disorder, unspecified: Secondary | ICD-10-CM | POA: Diagnosis not present

## 2022-07-01 DIAGNOSIS — M7061 Trochanteric bursitis, right hip: Secondary | ICD-10-CM | POA: Diagnosis not present

## 2022-07-01 DIAGNOSIS — M94261 Chondromalacia, right knee: Secondary | ICD-10-CM | POA: Diagnosis not present

## 2022-07-01 DIAGNOSIS — I499 Cardiac arrhythmia, unspecified: Secondary | ICD-10-CM | POA: Diagnosis not present

## 2022-07-01 DIAGNOSIS — M797 Fibromyalgia: Secondary | ICD-10-CM | POA: Diagnosis not present

## 2022-07-01 DIAGNOSIS — F32A Depression, unspecified: Secondary | ICD-10-CM | POA: Diagnosis not present

## 2022-07-01 DIAGNOSIS — X58XXXA Exposure to other specified factors, initial encounter: Secondary | ICD-10-CM | POA: Diagnosis not present

## 2022-07-01 HISTORY — PX: KNEE ARTHROSCOPY WITH MEDIAL MENISECTOMY: SHX5651

## 2022-07-01 SURGERY — ARTHROSCOPY, KNEE, WITH MEDIAL MENISCECTOMY
Anesthesia: General | Site: Knee | Laterality: Right

## 2022-07-01 MED ORDER — FENTANYL CITRATE (PF) 100 MCG/2ML IJ SOLN
INTRAMUSCULAR | Status: AC
  Filled 2022-07-01: qty 2

## 2022-07-01 MED ORDER — ACETAMINOPHEN 500 MG PO TABS
1000.0000 mg | ORAL_TABLET | Freq: Once | ORAL | Status: AC
  Administered 2022-07-01: 1000 mg via ORAL
  Filled 2022-07-01: qty 2

## 2022-07-01 MED ORDER — ASPIRIN 81 MG PO TBEC: 81.0000 mg | DELAYED_RELEASE_TABLET | Freq: Every day | ORAL | 1 refills | Status: AC

## 2022-07-01 MED ORDER — OXYCODONE HCL 5 MG PO TABS: 5.0000 mg | ORAL_TABLET | ORAL | 0 refills | Status: AC | PRN

## 2022-07-01 MED ORDER — MIDAZOLAM HCL 2 MG/2ML IJ SOLN
INTRAMUSCULAR | Status: AC
  Filled 2022-07-01: qty 2

## 2022-07-01 MED ORDER — CEFAZOLIN SODIUM-DEXTROSE 2-4 GM/100ML-% IV SOLN
2.0000 g | INTRAVENOUS | Status: AC
  Administered 2022-07-01: 2 g via INTRAVENOUS
  Filled 2022-07-01: qty 100

## 2022-07-01 MED ORDER — BUPIVACAINE HCL (PF) 0.5 % IJ SOLN
INTRAMUSCULAR | Status: AC
  Filled 2022-07-01: qty 30

## 2022-07-01 MED ORDER — LACTATED RINGERS IV SOLN: INTRAVENOUS | Status: DC

## 2022-07-01 MED ORDER — CHLORHEXIDINE GLUCONATE 0.12 % MT SOLN
15.0000 mL | Freq: Once | OROMUCOSAL | Status: AC
  Administered 2022-07-01: 15 mL via OROMUCOSAL

## 2022-07-01 MED ORDER — LIDOCAINE HCL (PF) 2 % IJ SOLN
INTRAMUSCULAR | Status: AC
  Filled 2022-07-01: qty 5

## 2022-07-01 MED ORDER — FENTANYL CITRATE PF 50 MCG/ML IJ SOSY: 25.0000 ug | PREFILLED_SYRINGE | INTRAMUSCULAR | Status: DC | PRN

## 2022-07-01 MED ORDER — OXYCODONE HCL 5 MG PO TABS
ORAL_TABLET | ORAL | Status: AC
  Filled 2022-07-01: qty 1

## 2022-07-01 MED ORDER — BUPIVACAINE HCL (PF) 0.25 % IJ SOLN
INTRAMUSCULAR | Status: DC | PRN
  Administered 2022-07-01: 30 mL

## 2022-07-01 MED ORDER — PHENYLEPHRINE HCL (PRESSORS) 10 MG/ML IV SOLN
INTRAVENOUS | Status: DC | PRN
  Administered 2022-07-01: 160 ug via INTRAVENOUS
  Administered 2022-07-01: 80 ug via INTRAVENOUS
  Administered 2022-07-01 (×2): 160 ug via INTRAVENOUS

## 2022-07-01 MED ORDER — FENTANYL CITRATE (PF) 100 MCG/2ML IJ SOLN
INTRAMUSCULAR | Status: DC | PRN
  Administered 2022-07-01 (×2): 50 ug via INTRAVENOUS

## 2022-07-01 MED ORDER — EPHEDRINE 5 MG/ML INJ
INTRAVENOUS | Status: AC
  Filled 2022-07-01: qty 5

## 2022-07-01 MED ORDER — OXYCODONE HCL 5 MG/5ML PO SOLN: 5.0000 mg | Freq: Once | ORAL | Status: AC | PRN

## 2022-07-01 MED ORDER — MIDAZOLAM HCL 5 MG/5ML IJ SOLN
INTRAMUSCULAR | Status: DC | PRN
  Administered 2022-07-01: 2 mg via INTRAVENOUS

## 2022-07-01 MED ORDER — SODIUM CHLORIDE 0.9 % IV SOLN
INTRAVENOUS | Status: DC | PRN
  Administered 2022-07-01: 3600 mL

## 2022-07-01 MED ORDER — DEXAMETHASONE SODIUM PHOSPHATE 10 MG/ML IJ SOLN
INTRAMUSCULAR | Status: AC
  Filled 2022-07-01: qty 1

## 2022-07-01 MED ORDER — PHENYLEPHRINE 80 MCG/ML (10ML) SYRINGE FOR IV PUSH (FOR BLOOD PRESSURE SUPPORT)
PREFILLED_SYRINGE | INTRAVENOUS | Status: AC
  Filled 2022-07-01: qty 10

## 2022-07-01 MED ORDER — POLYETHYLENE GLYCOL 3350 17 G PO PACK: 17.0000 g | PACK | Freq: Every day | ORAL | 0 refills | Status: AC

## 2022-07-01 MED ORDER — BUPIVACAINE-EPINEPHRINE (PF) 0.25% -1:200000 IJ SOLN
INTRAMUSCULAR | Status: AC
  Filled 2022-07-01: qty 30

## 2022-07-01 MED ORDER — DEXAMETHASONE SODIUM PHOSPHATE 10 MG/ML IJ SOLN
INTRAMUSCULAR | Status: DC | PRN
  Administered 2022-07-01: 8 mg via INTRAVENOUS

## 2022-07-01 MED ORDER — SCOPOLAMINE 1 MG/3DAYS TD PT72
MEDICATED_PATCH | TRANSDERMAL | Status: AC
  Filled 2022-07-01: qty 1

## 2022-07-01 MED ORDER — ORAL CARE MOUTH RINSE: 15.0000 mL | Freq: Once | OROMUCOSAL | Status: AC

## 2022-07-01 MED ORDER — AMISULPRIDE (ANTIEMETIC) 5 MG/2ML IV SOLN: 10.0000 mg | Freq: Once | INTRAVENOUS | Status: DC | PRN

## 2022-07-01 MED ORDER — EPINEPHRINE PF 1 MG/ML IJ SOLN
INTRAMUSCULAR | Status: AC
  Filled 2022-07-01: qty 2

## 2022-07-01 MED ORDER — LIDOCAINE HCL (CARDIAC) PF 100 MG/5ML IV SOSY
PREFILLED_SYRINGE | INTRAVENOUS | Status: DC | PRN
  Administered 2022-07-01: 80 mg via INTRAVENOUS

## 2022-07-01 MED ORDER — PROPOFOL 10 MG/ML IV BOLUS
INTRAVENOUS | Status: DC | PRN
  Administered 2022-07-01: 150 mg via INTRAVENOUS

## 2022-07-01 MED ORDER — OXYCODONE HCL 5 MG PO TABS
5.0000 mg | ORAL_TABLET | Freq: Once | ORAL | Status: AC | PRN
  Administered 2022-07-01: 5 mg via ORAL

## 2022-07-01 MED ORDER — SCOPOLAMINE 1 MG/3DAYS TD PT72
MEDICATED_PATCH | TRANSDERMAL | Status: DC | PRN
  Administered 2022-07-01: 1 via TRANSDERMAL

## 2022-07-01 MED ORDER — ONDANSETRON HCL 4 MG/2ML IJ SOLN
INTRAMUSCULAR | Status: DC | PRN
  Administered 2022-07-01: 4 mg via INTRAVENOUS

## 2022-07-01 MED ORDER — PROPOFOL 10 MG/ML IV BOLUS
INTRAVENOUS | Status: AC
  Filled 2022-07-01: qty 20

## 2022-07-01 MED ORDER — DOCUSATE SODIUM 100 MG PO CAPS: 100.0000 mg | ORAL_CAPSULE | Freq: Two times a day (BID) | ORAL | 1 refills | Status: AC | PRN

## 2022-07-01 MED ORDER — ONDANSETRON HCL 4 MG/2ML IJ SOLN
INTRAMUSCULAR | Status: AC
  Filled 2022-07-01: qty 2

## 2022-07-01 SURGICAL SUPPLY — 30 items
BAG COUNTER SPONGE SURGICOUNT (BAG) ×2 IMPLANT
BAG SPNG CNTER NS LX DISP (BAG) ×1
BLADE SHAVER TORPEDO 4X13 (MISCELLANEOUS) IMPLANT
BNDG CMPR 5X62 HK CLSR LF (GAUZE/BANDAGES/DRESSINGS) ×1
BNDG CMPR MED 15X6 ELC VLCR LF (GAUZE/BANDAGES/DRESSINGS) ×1
BNDG ELASTIC 6INX 5YD STR LF (GAUZE/BANDAGES/DRESSINGS) ×2 IMPLANT
BNDG ELASTIC 6X15 VLCR STRL LF (GAUZE/BANDAGES/DRESSINGS) IMPLANT
BNDG GAUZE DERMACEA FLUFF 4 (GAUZE/BANDAGES/DRESSINGS) IMPLANT
BNDG GZE DERMACEA 4 6PLY (GAUZE/BANDAGES/DRESSINGS) ×1
BOOTIES KNEE HIGH SLOAN (MISCELLANEOUS) ×4 IMPLANT
COVER SURGICAL LIGHT HANDLE (MISCELLANEOUS) ×2 IMPLANT
DURAPREP 26ML APPLICATOR (WOUND CARE) ×2 IMPLANT
DW OUTFLOW CASSETTE/TUBE SET (MISCELLANEOUS) ×2 IMPLANT
GAUZE PAD ABD 8X10 STRL (GAUZE/BANDAGES/DRESSINGS) ×2 IMPLANT
GAUZE SPONGE 4X4 12PLY STRL (GAUZE/BANDAGES/DRESSINGS) ×2 IMPLANT
GLOVE BIOGEL PI IND STRL 7.0 (GLOVE) ×2 IMPLANT
GLOVE SURG SS PI 8.0 STRL IVOR (GLOVE) ×2 IMPLANT
GOWN STRL REUS W/ TWL XL LVL3 (GOWN DISPOSABLE) ×4 IMPLANT
GOWN STRL REUS W/TWL XL LVL3 (GOWN DISPOSABLE) ×2
KIT TURNOVER KIT A (KITS) IMPLANT
MANIFOLD NEPTUNE II (INSTRUMENTS) ×2 IMPLANT
PACK ARTHROSCOPY WL (CUSTOM PROCEDURE TRAY) ×2 IMPLANT
PADDING CAST COTTON 6X4 STRL (CAST SUPPLIES) ×2 IMPLANT
PENCIL SMOKE EVACUATOR (MISCELLANEOUS) IMPLANT
SUT ETHILON 4 0 PS 2 18 (SUTURE) ×2 IMPLANT
TOWEL OR 17X26 10 PK STRL BLUE (TOWEL DISPOSABLE) ×2 IMPLANT
TUBING ARTHROSCOPY IRRIG 16FT (MISCELLANEOUS) ×2 IMPLANT
WAND APOLLORF SJ50 AR-9845 (SURGICAL WAND) IMPLANT
WIPE CHG 2% PREP (PERSONAL CARE ITEMS) ×2 IMPLANT
WRAP KNEE MAXI GEL POST OP (GAUZE/BANDAGES/DRESSINGS) ×2 IMPLANT

## 2022-07-01 NOTE — Brief Op Note (Signed)
07/01/2022  7:23 AM  PATIENT:  [name]  67 y.o. female  PRE-OPERATIVE DIAGNOSIS:  Degenerative joint disease, medial meniscal tear right knee  POST-OPERATIVE DIAGNOSIS:  Degenerative joint disease, medial meniscal tear right knee  PROCEDURE:  Procedure(s): KNEE ARTHROSCOPY WITH PARTIAL MEDIAL MENISECTOMY AND DEBRIDEMENT (Right)  SURGEON:  Surgeon(s) and Role:    Susa Day, MD - Primary  PHYSICIAN ASSISTANT:   ASSISTANTS: Bissell   ANESTHESIA:   spinal  EBL:  min   BLOOD ADMINISTERED:none  DRAINS: none   LOCAL MEDICATIONS USED:  MARCAINE     SPECIMEN:  No Specimen  DISPOSITION OF SPECIMEN:  N/A  COUNTS:  YES  TOURNIQUET:  * No tourniquets in log *  DICTATION: .Other Dictation: Dictation Number [id number]  PLAN OF CARE: Discharge to home after PACU  PATIENT DISPOSITION:  PACU - hemodynamically stable.   Delay start of Pharmacological VTE agent (>24hrs) due to surgical blood loss or risk of bleeding: no

## 2022-07-01 NOTE — Interval H&P Note (Signed)
History and Physical Interval Note:  07/01/2022 7:22 AM  [name]  has presented today for surgery, with the diagnosis of Degenerative joint disease, medial meniscal tear right knee.  The various methods of treatment have been discussed with the patient and family. After consideration of risks, benefits and other options for treatment, the patient has consented to  Procedure(s): KNEE ARTHROSCOPY WITH PARTIAL MEDIAL MENISECTOMY AND DEBRIDEMENT (Right) as a surgical intervention.  The patient's history has been reviewed, patient examined, no change in status, stable for surgery.  I have reviewed the patient's chart and labs.  Questions were answered to the patient's satisfaction.     Johnn Hai

## 2022-07-01 NOTE — Anesthesia Postprocedure Evaluation (Signed)
Anesthesia Post Note  Patient: [name]  Procedure(s) Performed: KNEE ARTHROSCOPY WITH PARTIAL MEDIAL MENISECTOMY AND DEBRIDEMENT (Right: Knee)     Patient location during evaluation: PACU Anesthesia Type: General Level of consciousness: awake Pain management: pain level controlled Vital Signs Assessment: post-procedure vital signs reviewed and stable Respiratory status: spontaneous breathing, nonlabored ventilation and respiratory function stable Cardiovascular status: blood pressure returned to baseline and stable Postop Assessment: no apparent nausea or vomiting Anesthetic complications: no   No notable events documented.  Last Vitals:  Vitals:   07/01/22 0900 07/01/22 0915  BP: 132/75 132/77  Pulse: 80 75  Resp: 20   Temp: 36.6 C   SpO2: 100% 99%    Last Pain:  Vitals:   07/01/22 0915  TempSrc:   PainSc: 5                  Nilda Simmer

## 2022-07-01 NOTE — Anesthesia Procedure Notes (Signed)
Procedure Name: LMA Insertion Date/Time: 07/01/2022 7:37 AM  Performed by: Jonna Munro, CRNAPre-anesthesia Checklist: Patient identified, Emergency Drugs available, Suction available, Patient being monitored and Timeout performed Patient Re-evaluated:Patient Re-evaluated prior to induction Oxygen Delivery Method: Circle system utilized Preoxygenation: Pre-oxygenation with 100% oxygen Induction Type: IV induction LMA: LMA inserted LMA Size: 4.0 Number of attempts: 1 Placement Confirmation: positive ETCO2, CO2 detector and breath sounds checked- equal and bilateral Tube secured with: Tape Dental Injury: Teeth and Oropharynx as per pre-operative assessment

## 2022-07-01 NOTE — Op Note (Unsigned)
[name], [name] MEDICAL RECORD NO: [id number] ACCOUNT NO: [id number] DATE OF BIRTH: 09-Jun-1955 FACILITY: [facility] LOCATION: [facility] PHYSICIAN: Johnn Hai, MD  Operative Report   DATE OF PROCEDURE: 07/01/2022  PREOPERATIVE DIAGNOSES:  Medial meniscus tear, osteoarthritis of the right knee.  POSTOPERATIVE DIAGNOSES:  1.  Medial and lateral meniscus tears of right knee. 2.  Extensive grade III changes of the medial femoral condyle, medial tibial plateau, patella and femoral sulcus.  PROCEDURE PERFORMED:   1.  Right knee arthroscopy. 2.  Partial medial and lateral meniscectomy. 3.  Chondroplasty of the patella, femoral sulcus, medial femoral condyle, medial tibial plateau.  ANESTHESIA:  General.  ASSISTANT:  Lacie Draft, PA, who was used in the case due to the difficulty in accessing the lateral compartment, requiring the figure-of-four position and only minor assistant.  HISTORY:  A 67 year old female with locking, popping, giving way of the knee indicated by MRI medial meniscus tear, extensive, complex, it was noted that she had degenerative changes of medial compartment, was indicated for knee arthroscopy, partial  meniscectomy.  Risks and benefits discussed including bleeding, infection, damage to neurovascular structures, no change in symptoms, worsening symptoms, DVT, PE, anesthetic complications, etc.  TECHNIQUE:  With the patient in supine position, after induction of adequate general anesthesia, 2 grams Kefzol, the right lower extremity was prepped and draped in the usual sterile fashion.  Timeout performed identifying the right leg.  A lateral  parapatellar portal was fashioned with a #11 blade.  Ingress cannula atraumatically placed.  Irrigant was utilized to insufflate the joint.  Under direct visualization, a medial parapatellar portal was fashioned with a #11 blade after localization with  18-gauge needle, sparing the medial meniscus.  Noted was extensive grade  III change in the medial compartment with loose cartilaginous debris.  There was a complex tear of the entire mid third of the medial meniscus extending into the posterior third of  the meniscus.  Under direct visualization, a medial parapatellar portal was fashioned with a #11 blade after localization with 18-gauge needle, sparing the medial meniscus, introduced a shaver and performed a chondroplasty of medial femoral condyle and  tibial plateau.  The entire weightbearing surface was involved.  Loose cartilaginous debris was evacuated as well.  I introduced a basket and performed a partial medial meniscectomy.  This was further contoured with a shaver and an ArthroWand switching  portals to optimize access.  Approximately two-thirds of the mid third of the meniscus had to be excised.  It was beveled on either end.  This extended posteriorly, used the ArthroWand for further contouring.  Somewhat tight in the posterior root.  The  remnant of the meniscus was stable to probe palpation.  Next, examination of lateral compartment in figure-of-four position revealed radial tearing of the lateral meniscus, some minor chondromalacia of the femoral condyle.  I introduced a shaver to perform partial lateral meniscectomy of the radial tears to a  stable base, but stable to probe palpation.  Suprapatellar pouch revealed some extensive grade III changes of the patellofemoral sulcus.  There was normal patellofemoral tracking.  Introduced a shaver and performed a chondroplasty of the patella and the femoral sulcus.  The entire patella and  femoral sulcus were involved.  The lateral gutters were unremarkable.  I then revisited all compartments.  There was no further pathology amenable to arthroscopic intervention.  I, therefore, removed all instrumentation.  Portals were closed with 4-0 nylon simple sutures.  0.25% Marcaine with epinephrine was infiltrated in  the joint.  Wound was dressed sterilely.  Awoken without  difficulty and transported to the recovery room in satisfactory condition.  The patient tolerated the procedure well.  No complications.  ASSISTANT:  Lacie Draft, PA, was used throughout the case again to monitor inflow, outflow of the arthroscopic fluid, placed the patient's leg in the figure-of-four position to gain access to a tight lateral compartment.      PAA D: 07/01/2022 8:30:18 am T: 07/01/2022 9:09:00 am  JOB: [id number]

## 2022-07-01 NOTE — Discharge Instructions (Signed)
Keep dressing clean and dry. After 2 days, remove dressing and place bandaids over stitches. Keep clean and dry and change as needed. Ice and elevate R leg toes above the nose 20-30 min at a time at least 4-5 times per day Do not kneel or squat Weight bear as tolerated. Use crutches as needed first 1-2 days Take a baby aspirin ('81mg'$ ) once daily until follow up to prevent blood clots Follow up in 10-14 days for suture removal

## 2022-07-01 NOTE — Transfer of Care (Signed)
Immediate Anesthesia Transfer of Care Note  Patient: [name]  Procedure(s) Performed: KNEE ARTHROSCOPY WITH PARTIAL MEDIAL MENISECTOMY AND DEBRIDEMENT (Right: Knee)  Patient Location: PACU  Anesthesia Type:General  Level of Consciousness: awake, drowsy, and patient cooperative  Airway & Oxygen Therapy: Patient Spontanous Breathing and Patient connected to face mask oxygen  Post-op Assessment: Report given to RN, Post -op Vital signs reviewed and stable, and Patient moving all extremities X 4  Post vital signs: Reviewed and stable  Last Vitals:  Vitals Value Taken Time  BP 108/59 07/01/22 0830  Temp    Pulse 64 07/01/22 0830  Resp 11 07/01/22 0830  SpO2 91 % 07/01/22 0830  Vitals shown include unvalidated device data.  Last Pain:  Vitals:   07/01/22 0620  TempSrc:   PainSc: 3          Complications: No notable events documented.

## 2022-07-02 ENCOUNTER — Encounter (HOSPITAL_COMMUNITY): Payer: Self-pay | Admitting: Specialist

## 2022-07-06 ENCOUNTER — Other Ambulatory Visit (HOSPITAL_COMMUNITY): Payer: Self-pay | Admitting: Otolaryngology

## 2022-07-06 ENCOUNTER — Ambulatory Visit (HOSPITAL_COMMUNITY)
Admission: RE | Admit: 2022-07-06 | Discharge: 2022-07-06 | Disposition: A | Discharge status: Home / Self Care | Payer: PPO | Source: Ambulatory Visit | Attending: Otolaryngology | Admitting: Otolaryngology

## 2022-07-06 DIAGNOSIS — M79604 Pain in right leg: Secondary | ICD-10-CM | POA: Insufficient documentation

## 2022-07-06 DIAGNOSIS — M7989 Other specified soft tissue disorders: Secondary | ICD-10-CM | POA: Insufficient documentation

## 2022-07-06 DIAGNOSIS — M79661 Pain in right lower leg: Secondary | ICD-10-CM | POA: Diagnosis not present

## 2022-12-03 DIAGNOSIS — C50912 Malignant neoplasm of unspecified site of left female breast: Secondary | ICD-10-CM | POA: Diagnosis not present

## 2023-01-26 ENCOUNTER — Other Ambulatory Visit: Payer: Self-pay | Admitting: Family Medicine

## 2023-01-26 DIAGNOSIS — Z171 Estrogen receptor negative status [ER-]: Secondary | ICD-10-CM

## 2023-01-26 DIAGNOSIS — Z1231 Encounter for screening mammogram for malignant neoplasm of breast: Secondary | ICD-10-CM

## 2023-01-28 ENCOUNTER — Ambulatory Visit
Admission: RE | Admit: 2023-01-28 | Discharge: 2023-01-28 | Disposition: A | Discharge status: Home / Self Care | Payer: PPO | Source: Ambulatory Visit | Attending: Hematology | Admitting: Hematology

## 2023-01-28 DIAGNOSIS — Z171 Estrogen receptor negative status [ER-]: Secondary | ICD-10-CM

## 2023-01-28 DIAGNOSIS — Z1231 Encounter for screening mammogram for malignant neoplasm of breast: Secondary | ICD-10-CM

## 2023-02-01 ENCOUNTER — Ambulatory Visit (HOSPITAL_COMMUNITY): Payer: PPO

## 2023-02-08 ENCOUNTER — Inpatient Hospital Stay: Payer: PPO | Attending: Hematology

## 2023-02-08 ENCOUNTER — Other Ambulatory Visit: Payer: Self-pay

## 2023-02-08 DIAGNOSIS — Z853 Personal history of malignant neoplasm of breast: Secondary | ICD-10-CM | POA: Insufficient documentation

## 2023-02-08 DIAGNOSIS — Z171 Estrogen receptor negative status [ER-]: Secondary | ICD-10-CM

## 2023-02-08 DIAGNOSIS — R059 Cough, unspecified: Secondary | ICD-10-CM | POA: Diagnosis not present

## 2023-02-08 DIAGNOSIS — Z79899 Other long term (current) drug therapy: Secondary | ICD-10-CM | POA: Diagnosis not present

## 2023-02-08 DIAGNOSIS — Z79810 Long term (current) use of selective estrogen receptor modulators (SERMs): Secondary | ICD-10-CM | POA: Diagnosis not present

## 2023-02-08 DIAGNOSIS — C50412 Malignant neoplasm of upper-outer quadrant of left female breast: Secondary | ICD-10-CM

## 2023-02-08 DIAGNOSIS — N644 Mastodynia: Secondary | ICD-10-CM | POA: Diagnosis not present

## 2023-02-08 DIAGNOSIS — Z9221 Personal history of antineoplastic chemotherapy: Secondary | ICD-10-CM | POA: Diagnosis not present

## 2023-02-08 DIAGNOSIS — M25519 Pain in unspecified shoulder: Secondary | ICD-10-CM | POA: Insufficient documentation

## 2023-02-08 DIAGNOSIS — E119 Type 2 diabetes mellitus without complications: Secondary | ICD-10-CM

## 2023-02-08 DIAGNOSIS — Z9012 Acquired absence of left breast and nipple: Secondary | ICD-10-CM | POA: Diagnosis not present

## 2023-02-08 LAB — VITAMIN D 25 HYDROXY (VIT D DEFICIENCY, FRACTURES): Vit D, 25-Hydroxy: 39.83 ng/mL (ref 30–100)

## 2023-02-08 LAB — CBC WITH DIFFERENTIAL/PLATELET
Abs Immature Granulocytes: 0.01 10*3/uL (ref 0.00–0.07)
Basophils Absolute: 0 10*3/uL (ref 0.0–0.1)
Basophils Relative: 1 %
Eosinophils Absolute: 0.4 10*3/uL (ref 0.0–0.5)
Eosinophils Relative: 7 %
HCT: 44 % (ref 36.0–46.0)
Hemoglobin: 14.8 g/dL (ref 12.0–15.0)
Immature Granulocytes: 0 %
Lymphocytes Relative: 40 %
Lymphs Abs: 2.4 10*3/uL (ref 0.7–4.0)
MCH: 32.1 pg (ref 26.0–34.0)
MCHC: 33.6 g/dL (ref 30.0–36.0)
MCV: 95.4 fL (ref 80.0–100.0)
Monocytes Absolute: 0.4 10*3/uL (ref 0.1–1.0)
Monocytes Relative: 6 %
Neutro Abs: 2.8 10*3/uL (ref 1.7–7.7)
Neutrophils Relative %: 46 %
Platelets: 266 10*3/uL (ref 150–400)
RBC: 4.61 MIL/uL (ref 3.87–5.11)
RDW: 12.7 % (ref 11.5–15.5)
WBC: 6 10*3/uL (ref 4.0–10.5)
nRBC: 0 % (ref 0.0–0.2)

## 2023-02-08 LAB — LIPID PANEL
Cholesterol: 177 mg/dL (ref 0–200)
HDL: 58 mg/dL (ref >40)
LDL Cholesterol: 58 mg/dL (ref 0–99)
Total CHOL/HDL Ratio: 3.1 {ratio}
Triglycerides: 303 mg/dL — ABNORMAL HIGH (ref <150)
VLDL: 61 mg/dL — ABNORMAL HIGH (ref 0–40)

## 2023-02-08 LAB — HEMOGLOBIN A1C
Hgb A1c MFr Bld: 5.7 % — ABNORMAL HIGH (ref 4.8–5.6)
Mean Plasma Glucose: 116.89 mg/dL

## 2023-02-08 LAB — TSH: TSH: 1.411 u[IU]/mL (ref 0.350–4.500)

## 2023-02-08 LAB — COMPREHENSIVE METABOLIC PANEL
ALT: 22 U/L (ref 0–44)
AST: 24 U/L (ref 15–41)
Albumin: 4.2 g/dL (ref 3.5–5.0)
Alkaline Phosphatase: 65 U/L (ref 38–126)
Anion gap: 8 (ref 5–15)
BUN: 11 mg/dL (ref 8–23)
CO2: 24 mmol/L (ref 22–32)
Calcium: 9.2 mg/dL (ref 8.9–10.3)
Chloride: 102 mmol/L (ref 98–111)
Creatinine, Ser: 0.71 mg/dL (ref 0.44–1.00)
GFR, Estimated: >60 mL/min (ref >60)
Glucose, Bld: 118 mg/dL — ABNORMAL HIGH (ref 70–99)
Potassium: 3.7 mmol/L (ref 3.5–5.1)
Sodium: 134 mmol/L — ABNORMAL LOW (ref 135–145)
Total Bilirubin: 0.6 mg/dL (ref 0.3–1.2)
Total Protein: 7.6 g/dL (ref 6.5–8.1)

## 2023-02-08 NOTE — Progress Notes (Signed)
Lab orders entered

## 2023-02-12 ENCOUNTER — Ambulatory Visit (HOSPITAL_COMMUNITY)
Admission: RE | Admit: 2023-02-12 | Discharge: 2023-02-12 | Disposition: A | Discharge status: Home / Self Care | Payer: PPO | Source: Ambulatory Visit | Attending: Family Medicine | Admitting: Family Medicine

## 2023-02-12 ENCOUNTER — Other Ambulatory Visit (HOSPITAL_COMMUNITY): Payer: Self-pay | Admitting: Family Medicine

## 2023-02-12 DIAGNOSIS — E782 Mixed hyperlipidemia: Secondary | ICD-10-CM | POA: Diagnosis not present

## 2023-02-12 DIAGNOSIS — G8929 Other chronic pain: Secondary | ICD-10-CM

## 2023-02-12 DIAGNOSIS — M25511 Pain in right shoulder: Secondary | ICD-10-CM | POA: Diagnosis not present

## 2023-02-12 DIAGNOSIS — Z6827 Body mass index (BMI) 27.0-27.9, adult: Secondary | ICD-10-CM | POA: Diagnosis not present

## 2023-02-12 DIAGNOSIS — Z0001 Encounter for general adult medical examination with abnormal findings: Secondary | ICD-10-CM | POA: Diagnosis not present

## 2023-02-12 DIAGNOSIS — Z1331 Encounter for screening for depression: Secondary | ICD-10-CM | POA: Diagnosis not present

## 2023-02-12 DIAGNOSIS — E663 Overweight: Secondary | ICD-10-CM | POA: Diagnosis not present

## 2023-02-12 DIAGNOSIS — M797 Fibromyalgia: Secondary | ICD-10-CM | POA: Diagnosis not present

## 2023-02-15 ENCOUNTER — Ambulatory Visit: Payer: PPO | Admitting: Physician Assistant

## 2023-02-22 ENCOUNTER — Ambulatory Visit (HOSPITAL_COMMUNITY)
Admission: RE | Admit: 2023-02-22 | Discharge: 2023-02-22 | Disposition: A | Discharge status: Home / Self Care | Payer: PPO | Source: Ambulatory Visit | Attending: Oncology | Admitting: Oncology

## 2023-02-22 ENCOUNTER — Inpatient Hospital Stay: Payer: PPO | Admitting: Oncology

## 2023-02-22 VITALS — BP 132/72 | HR 79 | Temp 98.3°F | Ht 66.0 in | Wt 169.1 lb

## 2023-02-22 DIAGNOSIS — R059 Cough, unspecified: Secondary | ICD-10-CM | POA: Diagnosis not present

## 2023-02-22 DIAGNOSIS — N644 Mastodynia: Secondary | ICD-10-CM

## 2023-02-22 DIAGNOSIS — R079 Chest pain, unspecified: Secondary | ICD-10-CM | POA: Diagnosis not present

## 2023-02-22 DIAGNOSIS — Z853 Personal history of malignant neoplasm of breast: Secondary | ICD-10-CM | POA: Diagnosis not present

## 2023-02-22 MED ORDER — CYCLOBENZAPRINE HCL 10 MG PO TABS: 10.0000 mg | ORAL_TABLET | Freq: Three times a day (TID) | ORAL | 0 refills | Status: AC | PRN

## 2023-02-22 NOTE — Progress Notes (Signed)
[facility] [address], [place] [address]   Patient Care Team: Assunta Found, MD as PCP - General (Family Medicine) Jonelle Sidle, MD as PCP - Cardiology (Cardiology) West Bali, MD (Inactive) as Consulting Physician (Gastroenterology) Lanelle Bal, DO as Consulting Physician (Internal Medicine)  SUMMARY OF ONCOLOGIC HISTORY: Oncology History  Breast cancer of upper-outer quadrant of left female breast [facility])  11/07/1999 Surgery   Lumpectomy/sentinel LN biopsy invasive ductal carcinoma and DCIS at [facility], 0/6 sentinel LN   11/10/1999 - 01/08/2005 Anti-estrogen oral therapy   Tamoxifen 20 mg daily   11/24/1999 - 01/02/2000 Radiation Therapy     12/18/2015 Mammogram   2D digital screening bilateral mammogram with CAD and adjunct TOMO, in the L breast a possible mass warrants further evaluation. In R breast no findings suspicious for malignancy   12/24/2015 Pathology Results   Invasive ductal carcinoma Grade 3 Left core needle biopsy 1:30 o clock ER- PR- Ki67 70%, HER 2 positive   12/24/2015 Imaging   L breast Ultrasound, notes palpable area of concern demonstrates a hypoechoic irregular mass at 1:30, 7 cm from the nipple measuring 1.3 x 0.9 x 1.4 cm. No evidence of L axillary LAD   01/07/2016 Procedure   Left simple mastectomy by Dr. Corliss Skains   01/08/2016 Pathology Results   Breast, simple mastectomy, Left - INVASIVE DUCTAL CARCINOMA, GRADE III/III, SPANNING 1.4 CM. - THE SURGICAL RESECTION MARGINS ARE NEGATIVE FOR CARCINOMA.   01/23/2016 Genetic Testing   Patient refused genetic testing at time of consultation with Maylon Cos.  "Despite our recommendation, Ms. Moors did not wish to pursue genetic testing at today's visit."   01/28/2016 Echocardiogram   Left ventricle: The cavity size was normal. Wall thickness was   normal. Systolic function was normal. The estimated ejection   fraction was in the range of 55% to 60%. Wall motion was  normal;   there were no regional wall motion abnormalities. Doppler   parameters are consistent with abnormal left ventricular   relaxation (grade 1 diastolic dysfunction).   01/31/2016 - 05/15/2016 Chemotherapy   The patient had palonosetron (ALOXI) injection 0.25 mg, 0.25 mg, Intravenous,  Once, 1 of 6 cycles  pegfilgrastim (NEULASTA ONPRO KIT) injection 6 mg, 6 mg, Subcutaneous, Once, 1 of 6 cycles  CARBOplatin (PARAPLATIN) 560 mg in sodium chloride 0.9 % 250 mL chemo infusion, 560 mg (100 % of original dose 558.5 mg), Intravenous,  Once, 1 of 6 cycles Dose modification:   (original dose 558.5 mg, Cycle 1),   (original dose 558.5 mg, Cycle 2)  DOCEtaxel (TAXOTERE) 140 mg in dextrose 5 % 250 mL chemo infusion, 75 mg/m2 = 140 mg, Intravenous,  Once, 1 of 6 cycles  for chemotherapy treatment.     01/31/2016 - 01/01/2017 Antibody Plan   Herceptin x 52 weeks   08/24/2016 Echocardiogram   Left ventricle: The cavity size was normal. Wall thickness was   normal. Systolic function was normal. The estimated ejection   fraction was in the range of 55% to 60%. Wall motion was normal;   there were no regional wall motion abnormalities. Doppler   parameters are consistent with abnormal left ventricular   relaxation (grade 1 diastolic dysfunction).   11/18/2016 Echocardiogram   LV EF: 55% -   60%. - Left ventricle: The cavity size was normal. Wall thickness was   normal. Systolic function was normal. The estimated ejection   fraction was in the range of 55% to 60%.  Wall motion was normal;   there were no regional wall motion abnormalities. Left   ventricular diastolic function parameters were normal. - Mitral valve: There was mild regurgitation. - Tricuspid valve: There was mild regurgitation.     CHIEF COMPLIANT: Follow-up for breast cancer   INTERVAL HISTORY: [name] is a 67 y.o. female here today for follow-up of her left breast cancer.  She was last seen in clinic on  02/12/2022.    In the interim, she had right knee arthroscopy with partial medial meniscectomy and debridement on 07/01/2022.  She had a mammogram on 01/28/2023 which was negative.  Saw PCP for right shoulder pain, chest and back X 3 months. Slowly worsening. Chronic fatigue. Had xray on 10/11 for concerns but has not been read.  Reports she is concerned about the pain in the right breast and would like an ultrasound.  Denies injury to right arm back or breast.  Was given a steroid injection while in clinic at PCPs office which helped slightly.  Thought she may have felt a mass/lump at the upper part of her right breast but cannot find it now.  Has a dry cough which she has had for several months but will not go away.  Has had a previous chest x-ray which was negative.  Reports insomnia and takes Xanax as needed.  REVIEW OF SYSTEMS:   Review of Systems  Constitutional:  Positive for fatigue.  HENT:   Positive for lump/mass.   Eyes:  Positive for eye problems.  Respiratory:  Positive for cough.   Cardiovascular:  Positive for chest pain and leg swelling.  Neurological:  Positive for dizziness and numbness.  Psychiatric/Behavioral:  Positive for sleep disturbance.     I have reviewed the past medical history, past surgical history, social history and family history with the patient and they are unchanged from previous note.   ALLERGIES:   is allergic to codeine, latex, and naprosyn [naproxen].   MEDICATIONS:  Current Outpatient Medications  Medication Sig Dispense Refill   cyclobenzaprine (FLEXERIL) 10 MG tablet Take 1 tablet (10 mg total) by mouth 3 (three) times daily as needed for muscle spasms. 30 tablet 0   acetaminophen (TYLENOL) 325 MG tablet Take 650 mg by mouth every 6 (six) hours as needed for moderate pain.     ALPRAZolam (XANAX) 0.5 MG tablet Take 1 tablet (0.5 mg total) by mouth 2 (two) times daily as needed for anxiety. 60 tablet 1   aspirin EC 81 MG tablet Take 1 tablet  (81 mg total) by mouth daily. Day after surgery (Patient taking differently: Take 81 mg by mouth as needed for moderate pain (pain score 4-6). Day after surgery) 60 tablet 1   Camphor-Menthol-Methyl Sal (SALONPAS) 3.05-09-08 % PTCH Place 1 patch onto the skin daily as needed (pain). (Patient not taking: Reported on 02/22/2023)     Carboxymethylcellulose Sodium (THERATEARS OP) Place 1 drop into both eyes 2 (two) times daily as needed (dry eyes).     Cholecalciferol (VITAMIN D3) 2000 units TABS Take 2,000 Units by mouth every morning.      docusate sodium (COLACE) 100 MG capsule Take 1 capsule (100 mg total) by mouth 2 (two) times daily as needed for mild constipation. (Patient not taking: Reported on 02/22/2023) 30 capsule 1   metoprolol tartrate (LOPRESSOR) 25 MG tablet Take 1 tablet (25 mg total) by mouth 2 (two) times daily. 25 mg in the morning. 12.5 mg in the evening, (Patient taking differently: Take  12.5-25 mg by mouth See admin instructions. Take 25 mg by mouth in the morning and take 12.5 mg in the evening) 60 tablet 0   omeprazole (PRILOSEC) 20 MG capsule Take 20 mg by mouth daily.     oxyCODONE (OXY IR/ROXICODONE) 5 MG immediate release tablet Take 1 tablet (5 mg total) by mouth every 4 (four) hours as needed for severe pain. 40 tablet 0   Polyethyl Glycol-Propyl Glycol (SYSTANE) 0.4-0.3 % GEL ophthalmic gel Place 1 Application into both eyes at bedtime.     polyethylene glycol (MIRALAX / GLYCOLAX) 17 g packet Take 17 g by mouth daily. (Patient taking differently: Take 17 g by mouth as needed.) 14 each 0   polyethylene glycol powder (MIRALAX) 17 GM/SCOOP powder Take 17 g by mouth daily as needed for moderate constipation.     rosuvastatin (CRESTOR) 5 MG tablet Take 5 mg by mouth every Monday, Wednesday, and Friday.     No current facility-administered medications for this visit.   Facility-Administered Medications Ordered in Other Visits  Medication Dose Route Frequency Provider Last Rate  Last Admin   sodium chloride flush (NS) 0.9 % injection 10 mL  10 mL Intracatheter PRN Penland, Novella Olive, MD         PHYSICAL EXAMINATION: Performance status (ECOG): 1 - Symptomatic but completely ambulatory  Vitals:   02/22/23 1329  BP: 132/72  Pulse: 79  Temp: 98.3 F (36.8 C)    Wt Readings from Last 3 Encounters:  02/22/23 169 lb 1.5 oz (76.7 kg)  07/01/22 165 lb 5.5 oz (75 kg)  06/23/22 167 lb (75.8 kg)   Physical Exam Constitutional:      Appearance: Normal appearance.  Cardiovascular:     Rate and Rhythm: Normal rate and regular rhythm.  Pulmonary:     Effort: Pulmonary effort is normal.     Breath sounds: Normal breath sounds.  Chest:     Chest wall: No mass, deformity, swelling or edema.  Breasts:    Right: Tenderness present. No skin change.     Left: Absent.     Comments: Mild tenderness to right outer breast at 2 o'clock. No definitive abnormality on breast exam. Abdominal:     General: Bowel sounds are normal.     Palpations: Abdomen is soft.  Musculoskeletal:        General: No swelling. Normal range of motion.     Cervical back: Normal.     Thoracic back: Normal.  Lymphadenopathy:     Upper Body:     Right upper body: No supraclavicular or axillary adenopathy.     Left upper body: No supraclavicular or axillary adenopathy.  Neurological:     Mental Status: She is alert and oriented to person, place, and time. Mental status is at baseline.    LABORATORY DATA:  I have reviewed the data as listed    Latest Ref Rng & Units 02/08/2023    2:47 PM 02/05/2022   11:21 AM 02/05/2021   10:29 AM  CMP  Glucose 70 - 99 mg/dL 454  098  119   BUN 8 - 23 mg/dL 11  11  14    Creatinine 0.44 - 1.00 mg/dL 1.47  8.29  5.62   Sodium 135 - 145 mmol/L 134  139  140   Potassium 3.5 - 5.1 mmol/L 3.7  4.4  5.0   Chloride 98 - 111 mmol/L 102  104  103   CO2 22 - 32 mmol/L 24  26  29  Calcium 8.9 - 10.3 mg/dL 9.2  9.7  16.1   Total Protein 6.5 - 8.1 g/dL 7.6  7.6   8.1   Total Bilirubin 0.3 - 1.2 mg/dL 0.6  0.9  0.7   Alkaline Phos 38 - 126 U/L 65  64  74   AST 15 - 41 U/L 24  33  26   ALT 0 - 44 U/L 22  29  23     No results found for: "CAN153" Lab Results  Component Value Date   WBC 6.0 02/08/2023   HGB 14.8 02/08/2023   HCT 44.0 02/08/2023   MCV 95.4 02/08/2023   PLT 266 02/08/2023   NEUTROABS 2.8 02/08/2023    ASSESSMENT:  1.  Stage Ia left breast invasive ductal carcinoma: - Patient was diagnosed with invasive ductal carcinoma, ER negative/PR negative/HER-2 positive. -Patient is status post mastectomy and Taxotere\carbo\Herceptin x6 cycles; maintenance Herceptin every 3 weeks for 1 year, she completed that on 01/01/2017. -She did not require adjuvant radiation therapy.  Patient was not recommended for adjuvant antiestrogen therapy given ER negative. -Last echo on 11/18/2016 was normal with a EF of 55 to 60%.   PLAN:  1.  Stage Ia left breast invasive ductal carcinoma: - Physical examination shows left mastectomy site is within normal limits with no suspicious masses.  Right breast has no palpable masses.  No palpable adenopathy.  Right breast tenderness at outer limits around 2:00.  No palpable abnormality. - Labs from 02/08/2023 shows normal LFTs and CBC.  Vitamin D was normal. - Reviewed right breast mammogram from 01/27/2023, BI-RADS Category 1. - Recommend follow-up in 1 year with repeat mammogram and labs.  2.  Right breast pain/ back scapular pain and shoulder pain: -Recent mammogram was negative. -Had x-ray of right shoulder which has not been read yet.  No obvious injury or deformity -Breast exam was negative. -Patient would like to have ultrasound of right breast given pain that is slowly worsening and radiating to her back.  Orders placed.  Will call with results. -We discussed waiting until x-ray of right shoulder returned that might explain the discomfort but she would like to pursue an ultrasound ASAP. -Previously tried  steroids which helped minimally.  Would like to try a muscle relaxer. -Rx Flexeril 10 mg tablets which she can take every 8 hours as needed.  Recommend starting at bedtime.  Has taken Flexeril in the past and tolerated well.  3.  Dry cough: -Exam negative but given she has had symptoms for several months would recommend x-ray. -Will call with results.   Orders placed this encounter:  Orders Placed This Encounter  Procedures   DG Chest 2 View   MM DIAG BREAST TOMO UNI RIGHT   [place] LIMITED ULTRASOUND INCLUDING AXILLA RIGHT BREAST   I spent 25 minutes dedicated to the care of this patient (face-to-face and non-face-to-face) on the date of the encounter to include what is described in the assessment and plan.   The patient has a good understanding of the overall plan. She agrees with it. She will call with any problems that may develop before the next visit here.  Durenda Hurt, NP 02/22/2023 2:18 PM

## 2023-02-22 NOTE — Progress Notes (Signed)
Hey Tomi,   Can you call this patient and let her know her chest x-ray was negative. She does not have mychart.   Thanks,   Mignon Pine

## 2023-02-25 NOTE — Progress Notes (Signed)
Patient called.  Patient aware.  

## 2023-03-09 ENCOUNTER — Ambulatory Visit
Admission: RE | Admit: 2023-03-09 | Discharge: 2023-03-09 | Disposition: A | Discharge status: Home / Self Care | Payer: PPO | Source: Ambulatory Visit | Attending: Oncology | Admitting: Oncology

## 2023-03-09 ENCOUNTER — Ambulatory Visit: Payer: PPO

## 2023-03-09 DIAGNOSIS — N644 Mastodynia: Secondary | ICD-10-CM

## 2023-03-16 DIAGNOSIS — S139XXA Sprain of joints and ligaments of unspecified parts of neck, initial encounter: Secondary | ICD-10-CM | POA: Diagnosis not present

## 2023-03-16 DIAGNOSIS — M25511 Pain in right shoulder: Secondary | ICD-10-CM | POA: Diagnosis not present

## 2023-04-08 ENCOUNTER — Encounter (HOSPITAL_BASED_OUTPATIENT_CLINIC_OR_DEPARTMENT_OTHER): Payer: Self-pay

## 2023-04-08 DIAGNOSIS — G4733 Obstructive sleep apnea (adult) (pediatric): Secondary | ICD-10-CM

## 2023-04-13 DIAGNOSIS — H2513 Age-related nuclear cataract, bilateral: Secondary | ICD-10-CM | POA: Diagnosis not present

## 2023-05-12 DIAGNOSIS — A493 Mycoplasma infection, unspecified site: Secondary | ICD-10-CM | POA: Diagnosis not present

## 2023-05-12 DIAGNOSIS — Z20828 Contact with and (suspected) exposure to other viral communicable diseases: Secondary | ICD-10-CM | POA: Diagnosis not present

## 2023-05-12 DIAGNOSIS — R6889 Other general symptoms and signs: Secondary | ICD-10-CM | POA: Diagnosis not present

## 2023-05-12 DIAGNOSIS — Z6827 Body mass index (BMI) 27.0-27.9, adult: Secondary | ICD-10-CM | POA: Diagnosis not present

## 2023-05-12 DIAGNOSIS — E663 Overweight: Secondary | ICD-10-CM | POA: Diagnosis not present

## 2023-06-02 ENCOUNTER — Ambulatory Visit (HOSPITAL_BASED_OUTPATIENT_CLINIC_OR_DEPARTMENT_OTHER): Payer: HMO | Attending: Family Medicine | Admitting: Internal Medicine

## 2023-06-02 VITALS — Ht 66.0 in | Wt 170.0 lb

## 2023-06-02 DIAGNOSIS — R0683 Snoring: Secondary | ICD-10-CM | POA: Insufficient documentation

## 2023-06-02 DIAGNOSIS — G4733 Obstructive sleep apnea (adult) (pediatric): Secondary | ICD-10-CM | POA: Insufficient documentation

## 2023-06-02 DIAGNOSIS — G4730 Sleep apnea, unspecified: Secondary | ICD-10-CM | POA: Diagnosis present

## 2023-06-06 NOTE — Procedures (Signed)
                             Jetty Duhamel Diplomate, Biomedical engineer of Sleep Medicine  ELECTRONICALLY SIGNED ON:  06/06/2023, 12:41 PM [facility] PH: [phone]   FX: [phone] ACCREDITED BY THE [employer] OF SLEEP MEDIC[employer]

## 2023-07-21 ENCOUNTER — Encounter: Payer: Self-pay | Admitting: Internal Medicine

## 2023-07-27 DIAGNOSIS — Z6827 Body mass index (BMI) 27.0-27.9, adult: Secondary | ICD-10-CM | POA: Diagnosis not present

## 2023-07-27 DIAGNOSIS — R5383 Other fatigue: Secondary | ICD-10-CM | POA: Diagnosis not present

## 2023-07-27 DIAGNOSIS — M1711 Unilateral primary osteoarthritis, right knee: Secondary | ICD-10-CM | POA: Diagnosis not present

## 2023-07-27 DIAGNOSIS — F419 Anxiety disorder, unspecified: Secondary | ICD-10-CM | POA: Diagnosis not present

## 2023-07-27 DIAGNOSIS — M797 Fibromyalgia: Secondary | ICD-10-CM | POA: Diagnosis not present

## 2023-07-27 DIAGNOSIS — R6889 Other general symptoms and signs: Secondary | ICD-10-CM | POA: Diagnosis not present

## 2023-07-27 DIAGNOSIS — R7303 Prediabetes: Secondary | ICD-10-CM | POA: Diagnosis not present

## 2023-07-27 DIAGNOSIS — E663 Overweight: Secondary | ICD-10-CM | POA: Diagnosis not present

## 2023-07-27 DIAGNOSIS — G2581 Restless legs syndrome: Secondary | ICD-10-CM | POA: Diagnosis not present

## 2023-07-28 ENCOUNTER — Other Ambulatory Visit (HOSPITAL_COMMUNITY): Payer: Self-pay | Admitting: Family Medicine

## 2023-07-28 DIAGNOSIS — M797 Fibromyalgia: Secondary | ICD-10-CM

## 2023-07-28 DIAGNOSIS — R6889 Other general symptoms and signs: Secondary | ICD-10-CM

## 2023-08-05 ENCOUNTER — Ambulatory Visit (HOSPITAL_COMMUNITY)
Admission: RE | Admit: 2023-08-05 | Discharge: 2023-08-05 | Disposition: A | Discharge status: Home / Self Care | Source: Ambulatory Visit | Attending: Family Medicine | Admitting: Family Medicine

## 2023-08-05 DIAGNOSIS — R6889 Other general symptoms and signs: Secondary | ICD-10-CM | POA: Insufficient documentation

## 2023-08-05 DIAGNOSIS — M797 Fibromyalgia: Secondary | ICD-10-CM | POA: Diagnosis not present

## 2023-08-10 DIAGNOSIS — M79672 Pain in left foot: Secondary | ICD-10-CM | POA: Diagnosis not present

## 2023-08-10 DIAGNOSIS — I872 Venous insufficiency (chronic) (peripheral): Secondary | ICD-10-CM | POA: Diagnosis not present

## 2023-08-10 DIAGNOSIS — M79671 Pain in right foot: Secondary | ICD-10-CM | POA: Diagnosis not present

## 2023-08-10 DIAGNOSIS — G620 Drug-induced polyneuropathy: Secondary | ICD-10-CM | POA: Diagnosis not present

## 2023-09-07 DIAGNOSIS — I872 Venous insufficiency (chronic) (peripheral): Secondary | ICD-10-CM | POA: Diagnosis not present

## 2023-09-07 DIAGNOSIS — G620 Drug-induced polyneuropathy: Secondary | ICD-10-CM | POA: Diagnosis not present

## 2023-09-07 DIAGNOSIS — M79672 Pain in left foot: Secondary | ICD-10-CM | POA: Diagnosis not present

## 2023-09-07 DIAGNOSIS — M79671 Pain in right foot: Secondary | ICD-10-CM | POA: Diagnosis not present

## 2023-09-29 ENCOUNTER — Other Ambulatory Visit: Payer: Self-pay

## 2023-09-29 DIAGNOSIS — I872 Venous insufficiency (chronic) (peripheral): Secondary | ICD-10-CM

## 2023-10-14 NOTE — Progress Notes (Signed)
 Patient ID: [name], female   DOB: June 04, 1955, 68 y.o.   MRN: [id number]  Reason for Consult: New Patient (Initial Visit)   Referred by Iverson Market, DPM  Subjective:     HPI  [name] is a 68 y.o. female who presents for evaluation of lower extremity swelling and some numbness, tingling burning sensation.  She had an injury to her ankle in the past.  She does have some swelling but denies significant heaviness or aching in her legs.  She has a history of breast cancer treated with surgery and chemotherapy which is when she reports she had the onset of the burning sensation in her feet.  She denies significant varicosities or previous wounds.  Past Medical History:  Diagnosis Date   Anemia    Anxiety    Arthritis    Breast cancer (HCC)    Left   Bulging of cervical intervertebral disc    Dental crowns present    Depression    Fibromyalgia    GERD (gastroesophageal reflux disease)    History of chemotherapy 2017   Docetaxel , Carboplatin , Herceptin    History of left breast cancer 01/2016   Hypertension    Multiple thyroid  nodules    Palpitations    Family History  Problem Relation Age of Onset   Colon cancer Mother 91       Deceased secondary to metastatic cancer   Diabetes Father    Diabetes Sister    Alcohol  abuse Brother    Lung cancer Brother 42   Cancer Brother        Penile   Breast cancer Maternal Aunt    Cancer Other    Past Surgical History:  Procedure Laterality Date   ABDOMINAL HYSTERECTOMY  04/2017   complete   BREAST BIOPSY Left    COLONOSCOPY N/A 03/09/2017   Procedure: COLONOSCOPY;  Surgeon: Alyce Jubilee, MD;  Location: [facility];  Service: Endoscopy;  Laterality: N/A;  2:45pm   KNEE ARTHROSCOPY WITH MEDIAL MENISECTOMY Right 07/01/2022   Procedure: KNEE ARTHROSCOPY WITH PARTIAL MEDIAL MENISECTOMY AND DEBRIDEMENT;  Surgeon: Orvan Blanch, MD;  Location: [facility];  Service: Orthopedics;  Laterality: Right;   LAPAROSCOPIC  APPENDECTOMY N/A 11/10/2012   Procedure: APPENDECTOMY LAPAROSCOPIC;  Surgeon: Beau Bound, MD;  Location: [facility];  Service: General;  Laterality: N/A;   MASTECTOMY Left 01/07/2016   breast cancer   MASTECTOMY W/ SENTINEL NODE BIOPSY Left 01/07/2016   Procedure: LEFT TOTAL MASTECTOMY WITH LEFT AXILLARY SENTINEL LYMPH NODE BIOPSY;  Surgeon: Dareen Ebbing, MD;  Location: [facility];  Service: General;  Laterality: Left;   PORT-A-CATH REMOVAL N/A 09/07/2017   Procedure: REMOVAL PORT-A-CATH;  Surgeon: Dareen Ebbing, MD;  Location: [facility];  Service: General;  Laterality: N/A;   PORTACATH PLACEMENT Right 01/07/2016   Procedure: INSERTION PORT-A-CATH RIGHT INTERNAL JUGULAR WITH ULTRASOUND;  Surgeon: Dareen Ebbing, MD;  Location: [facility];  Service: General;  Laterality: Right;    Short Social History:  Social History   Tobacco Use   Smoking status: Never   Smokeless tobacco: Never  Substance Use Topics   Alcohol  use: No    Allergies  Allergen Reactions   Codeine Nausea And Vomiting   Latex Itching and Rash   Naprosyn [Naproxen] Itching and Rash    Current Outpatient Medications  Medication Sig Dispense Refill   acetaminophen  (TYLENOL ) 325 MG tablet Take 650 mg by mouth every 6 (six) hours as needed for moderate pain.  aspirin  EC 81 MG tablet Take 1 tablet (81 mg total) by mouth daily. Day after surgery 60 tablet 1   Camphor-Menthol-Methyl Sal (SALONPAS) 3.05-09-08 % PTCH Place 1 patch onto the skin daily as needed (pain).     Cholecalciferol (VITAMIN D3) 2000 units TABS Take 2,000 Units by mouth every morning.      cyclobenzaprine  (FLEXERIL ) 10 MG tablet Take 1 tablet (10 mg total) by mouth 3 (three) times daily as needed for muscle spasms. 30 tablet 0   metoprolol  tartrate (LOPRESSOR ) 25 MG tablet Take 1 tablet (25 mg total) by mouth 2 (two) times daily. 25 mg in the morning. 12.5 mg in the evening, (Patient taking differently: Take 12.5-25 mg by mouth See admin  instructions. Take 25 mg by mouth in the morning and take 12.5 mg in the evening) 60 tablet 0   omeprazole  (PRILOSEC) 20 MG capsule Take 20 mg by mouth daily.     Polyethyl Glycol-Propyl Glycol (SYSTANE) 0.4-0.3 % GEL ophthalmic gel Place 1 Application into both eyes at bedtime.     polyethylene glycol (MIRALAX  / GLYCOLAX ) 17 g packet Take 17 g by mouth daily. 14 each 0   polyethylene glycol powder (MIRALAX ) 17 GM/SCOOP powder Take 17 g by mouth daily as needed for moderate constipation.     rosuvastatin (CRESTOR) 5 MG tablet Take 5 mg by mouth every Monday, Wednesday, and Friday.     ALPRAZolam  (XANAX ) 0.5 MG tablet Take 1 tablet (0.5 mg total) by mouth 2 (two) times daily as needed for anxiety. 60 tablet 1   Carboxymethylcellulose Sodium (THERATEARS OP) Place 1 drop into both eyes 2 (two) times daily as needed (dry eyes). (Patient not taking: Reported on 10/15/2023)     docusate sodium  (COLACE) 100 MG capsule Take 1 capsule (100 mg total) by mouth 2 (two) times daily as needed for mild constipation. (Patient not taking: Reported on 10/15/2023) 30 capsule 1   oxyCODONE  (OXY IR/ROXICODONE ) 5 MG immediate release tablet Take 1 tablet (5 mg total) by mouth every 4 (four) hours as needed for severe pain. (Patient not taking: Reported on 10/15/2023) 40 tablet 0   No current facility-administered medications for this visit.   Facility-Administered Medications Ordered in Other Visits  Medication Dose Route Frequency Provider Last Rate Last Admin   sodium chloride  flush (NS) 0.9 % injection 10 mL  10 mL Intracatheter PRN Penland, Marge Shed, MD        REVIEW OF SYSTEMS All other systems were reviewed and are negative    Objective:  Objective   Vitals:   10/15/23 1345  BP: 137/80  Pulse: 75  Resp: 20  Temp: 98.1 F (36.7 C)  TempSrc: Temporal  SpO2: 99%  Weight: 165 lb 14.4 oz (75.3 kg)  Height: 5' 6 (1.676 m)   Body mass index is 26.78 kg/m.  Physical Exam General: no acute  distress Cardiac: hemodynamically stable Pulm: normal work of breathing Neuro: alert, no focal deficit Extremities: Mild 1+ edema bilaterally, no cyanosis or wounds Vascular:   Right: palpable DP, PT  Left: palpable DP, PT   Data: Reflux study Venous Reflux Times  +--------------+---------+------+-----------+------------+--------+  RIGHT        Reflux NoRefluxReflux TimeDiameter cmsComments                          Yes                                   +--------------+---------+------+-----------+------------+--------+  CFV                    yes   >1 second                       +--------------+---------+------+-----------+------------+--------+  FV mid        no                                              +--------------+---------+------+-----------+------------+--------+  Popliteal    no                                              +--------------+---------+------+-----------+------------+--------+  GSV at [facility]    no               >500 ms      0.4               +--------------+---------+------+-----------+------------+--------+  GSV prox thigh          yes    >500 ms      0.34              +--------------+---------+------+-----------+------------+--------+  GSV mid thigh           yes    >500 ms      0.59              +--------------+---------+------+-----------+------------+--------+  GSV dist thigh          yes    >500 ms      0.4               +--------------+---------+------+-----------+------------+--------+  GSV at knee             yes    >500 ms      0.46              +--------------+---------+------+-----------+------------+--------+  GSV prox calf           yes    >500 ms      0.38              +--------------+---------+------+-----------+------------+--------+  SSV Pop Fossa no                            0.3                +--------------+---------+------+-----------+------------+--------+  SSV prox calf no                            0.28              +--------------+---------+------+-----------+------------+--------+  SSV mid calf            yes    >500 ms      0.28              +--------------+---------+------+-----------+------------+--------+     Summary:  Right:  - No evidence of deep vein thrombosis seen in the right lower extremity,  from the common femoral through the popliteal veins.  - No evidence of superficial venous thrombosis in the right lower  extremity.   - Deep vein reflux in the CFV.   - Superficial vein reflux in  the GSV from the proximal thigh to the  proximal calf; and in the SSV mid calf.       Assessment/Plan:     MYKERIA GARMAN is a 68 y.o. female with chronic venous insufficiency with C3 disease and reflux noted in the right GSV I explained the foundation of CVI treatment of compression and elevation I recommended medical grade graduated compression stockings and intermittent leg elevation We also discussed that many patients find symptom improvement with exercise  I also explained that she likely has neuropathy given her burning and numbness sensations in her feet.  I recommended discussing a trial of gabapentin  with her PCP although she reports she has had that in the past when she had her breast surgery and she remembers it causing worsening pain.  From a venous insufficiency standpoint her symptoms for mild and I instructed her to start the compression and elevation regimen and she will call for follow-up if she has any worsening of her symptoms     Philipp Brawn MD [facility]

## 2023-10-15 ENCOUNTER — Ambulatory Visit (HOSPITAL_COMMUNITY)
Admission: RE | Admit: 2023-10-15 | Discharge: 2023-10-15 | Disposition: A | Discharge status: Home / Self Care | Source: Ambulatory Visit | Attending: Vascular Surgery | Admitting: Vascular Surgery

## 2023-10-15 ENCOUNTER — Encounter: Payer: Self-pay | Admitting: Vascular Surgery

## 2023-10-15 ENCOUNTER — Ambulatory Visit: Attending: Vascular Surgery | Admitting: Vascular Surgery

## 2023-10-15 VITALS — BP 137/80 | HR 75 | Temp 98.1°F | Resp 20 | Ht 66.0 in | Wt 165.9 lb

## 2023-10-15 DIAGNOSIS — I872 Venous insufficiency (chronic) (peripheral): Secondary | ICD-10-CM | POA: Diagnosis not present

## 2024-01-11 ENCOUNTER — Ambulatory Visit: Admission: EM | Admit: 2024-01-11 | Discharge: 2024-01-11 | Disposition: A | Discharge status: Home / Self Care

## 2024-01-11 DIAGNOSIS — R0981 Nasal congestion: Secondary | ICD-10-CM

## 2024-01-11 DIAGNOSIS — R0982 Postnasal drip: Secondary | ICD-10-CM

## 2024-01-11 MED ORDER — AZITHROMYCIN 250 MG PO TABS: ORAL_TABLET | ORAL | 0 refills | Status: AC

## 2024-01-11 MED ORDER — PROMETHAZINE-DM 6.25-15 MG/5ML PO SYRP: 5.0000 mL | ORAL_SOLUTION | Freq: Four times a day (QID) | ORAL | 0 refills | Status: AC | PRN

## 2024-01-11 MED ORDER — AZELASTINE HCL 0.1 % NA SOLN: 1.0000 | Freq: Two times a day (BID) | NASAL | 1 refills | Status: AC

## 2024-01-11 NOTE — Discharge Instructions (Signed)
  1. Nasal sinus congestion (Primary) 2. Postnasal drip - azelastine  (ASTELIN ) 0.1 % nasal spray; Place 1 spray into both nostrils 2 (two) times daily. Use in each nostril as directed  Dispense: 30 mL; Refill: 1 - promethazine -dextromethorphan (PROMETHAZINE -DM) 6.25-15 MG/5ML syrup; Take 5 mLs by mouth 4 (four) times daily as needed for cough.  Dispense: 180 mL; Refill: 0 - azithromycin  (ZITHROMAX  Z-PAK) 250 MG tablet; Take 500 mg on day 1 followed by 250 mg for 4 days.  Take medication for a total of 5 days.  Dispense: 6 tablet; Refill: 0 -Continue to monitor symptoms for any change in severity if there is any escalation of current symptoms or development of new symptoms follow-up in ER for further evaluation and management.

## 2024-01-11 NOTE — ED Provider Notes (Signed)
 [facility]   Note:  This document was prepared using Dragon voice recognition software and may include unintentional dictation errors.  MRN: [id number] DOB: [birth year]  Subjective:   [name] is a 68 y.o. female presenting for headache, sinus pressure, postnasal drainage, cough x 1 week.  Patient reports symptoms initially started with postnasal drainage and sinus congestion but over the last week have developed into headache and sinus pressure.  Patient has not taken any over-the-counter medication to treat symptoms.  Patient reports past history of upper respiratory infections and sinus infections.  Patient reports that her doctor usually prescribes her azithromycin  because she cannot tolerate Augmentin and no other antibiotics have helped with her sinus infections.  No fever, shortness of breath, chest pain, weakness, dizziness.  No current facility-administered medications for this encounter.  Current Outpatient Medications:    azelastine  (ASTELIN ) 0.1 % nasal spray, Place 1 spray into both nostrils 2 (two) times daily. Use in each nostril as directed, Disp: 30 mL, Rfl: 1   azithromycin  (ZITHROMAX  Z-PAK) 250 MG tablet, Take 500 mg on day 1 followed by 250 mg for 4 days.  Take medication for a total of 5 days., Disp: 6 tablet, Rfl: 0   promethazine -dextromethorphan (PROMETHAZINE -DM) 6.25-15 MG/5ML syrup, Take 5 mLs by mouth 4 (four) times daily as needed for cough., Disp: 180 mL, Rfl: 0   acetaminophen  (TYLENOL ) 325 MG tablet, Take 650 mg by mouth every 6 (six) hours as needed for moderate pain., Disp: , Rfl:    ALPRAZolam  (XANAX ) 0.5 MG tablet, Take 1 tablet (0.5 mg total) by mouth 2 (two) times daily as needed for anxiety., Disp: 60 tablet, Rfl: 1   aspirin  EC 81 MG tablet, Take 1 tablet (81 mg total) by mouth daily. Day after surgery, Disp: 60 tablet, Rfl: 1   Camphor-Menthol-Methyl Sal (SALONPAS) 3.05-09-08 % PTCH, Place 1 patch onto the skin daily as needed (pain)., Disp:  , Rfl:    Carboxymethylcellulose Sodium (THERATEARS OP), Place 1 drop into both eyes 2 (two) times daily as needed (dry eyes). (Patient not taking: Reported on 10/15/2023), Disp: , Rfl:    Cholecalciferol (VITAMIN D3) 2000 units TABS, Take 2,000 Units by mouth every morning. , Disp: , Rfl:    cyclobenzaprine  (FLEXERIL ) 10 MG tablet, Take 1 tablet (10 mg total) by mouth 3 (three) times daily as needed for muscle spasms., Disp: 30 tablet, Rfl: 0   docusate sodium  (COLACE) 100 MG capsule, Take 1 capsule (100 mg total) by mouth 2 (two) times daily as needed for mild constipation. (Patient not taking: Reported on 02/22/2023), Disp: 30 capsule, Rfl: 1   metoprolol  tartrate (LOPRESSOR ) 25 MG tablet, Take 1 tablet (25 mg total) by mouth 2 (two) times daily. 25 mg in the morning. 12.5 mg in the evening, (Patient taking differently: Take 12.5-25 mg by mouth See admin instructions. Take 25 mg by mouth in the morning and take 12.5 mg in the evening), Disp: 60 tablet, Rfl: 0   omeprazole  (PRILOSEC) 20 MG capsule, Take 20 mg by mouth daily., Disp: , Rfl:    oxyCODONE  (OXY IR/ROXICODONE ) 5 MG immediate release tablet, Take 1 tablet (5 mg total) by mouth every 4 (four) hours as needed for severe pain. (Patient not taking: Reported on 10/15/2023), Disp: 40 tablet, Rfl: 0   Polyethyl Glycol-Propyl Glycol (SYSTANE) 0.4-0.3 % GEL ophthalmic gel, Place 1 Application into both eyes at bedtime., Disp: , Rfl:    polyethylene glycol (MIRALAX  / GLYCOLAX ) 17 g packet, Take 17  g by mouth daily., Disp: 14 each, Rfl: 0   polyethylene glycol powder (MIRALAX ) 17 GM/SCOOP powder, Take 17 g by mouth daily as needed for moderate constipation., Disp: , Rfl:    rosuvastatin (CRESTOR) 5 MG tablet, Take 5 mg by mouth every Monday, Wednesday, and Friday., Disp: , Rfl:   Facility-Administered Medications Ordered in Other Encounters:    sodium chloride  flush (NS) 0.9 % injection 10 mL, 10 mL, Intracatheter, PRN, Penland, Clotilda POUR, MD    Allergies  Allergen Reactions   Codeine Nausea And Vomiting   Latex Itching and Rash   Naprosyn [Naproxen] Itching and Rash    Past Medical History:  Diagnosis Date   Anemia    Anxiety    Arthritis    Breast cancer (HCC)    Left   Bulging of cervical intervertebral disc    Dental crowns present    Depression    Fibromyalgia    GERD (gastroesophageal reflux disease)    History of chemotherapy 2017   Docetaxel , Carboplatin , Herceptin    History of left breast cancer 01/2016   Hypertension    Multiple thyroid  nodules    Palpitations      Past Surgical History:  Procedure Laterality Date   ABDOMINAL HYSTERECTOMY  04/2017   complete   BREAST BIOPSY Left    COLONOSCOPY N/A 03/09/2017   Procedure: COLONOSCOPY;  Surgeon: Harvey Margo CROME, MD;  Location: [facility];  Service: Endoscopy;  Laterality: N/A;  2:45pm   KNEE ARTHROSCOPY WITH MEDIAL MENISECTOMY Right 07/01/2022   Procedure: KNEE ARTHROSCOPY WITH PARTIAL MEDIAL MENISECTOMY AND DEBRIDEMENT;  Surgeon: Duwayne Purchase, MD;  Location: [facility];  Service: Orthopedics;  Laterality: Right;   LAPAROSCOPIC APPENDECTOMY N/A 11/10/2012   Procedure: APPENDECTOMY LAPAROSCOPIC;  Surgeon: Oneil DELENA Budge, MD;  Location: [facility];  Service: General;  Laterality: N/A;   MASTECTOMY Left 01/07/2016   breast cancer   MASTECTOMY W/ SENTINEL NODE BIOPSY Left 01/07/2016   Procedure: LEFT TOTAL MASTECTOMY WITH LEFT AXILLARY SENTINEL LYMPH NODE BIOPSY;  Surgeon: Donnice Lima, MD;  Location: [facility];  Service: General;  Laterality: Left;   PORT-A-CATH REMOVAL N/A 09/07/2017   Procedure: REMOVAL PORT-A-CATH;  Surgeon: Lima Donnice, MD;  Location: [facility];  Service: General;  Laterality: N/A;   PORTACATH PLACEMENT Right 01/07/2016   Procedure: INSERTION PORT-A-CATH RIGHT INTERNAL JUGULAR WITH ULTRASOUND;  Surgeon: Donnice Lima, MD;  Location: [facility];  Service: General;  Laterality: Right;    Family History  Problem Relation Age  of Onset   Colon cancer Mother 59       Deceased secondary to metastatic cancer   Diabetes Father    Diabetes Sister    Alcohol  abuse Brother    Lung cancer Brother 63   Cancer Brother        Penile   Breast cancer Maternal Aunt    Cancer Other     Social History   Tobacco Use   Smoking status: Never   Smokeless tobacco: Never  Vaping Use   Vaping status: Never Used  Substance Use Topics   Alcohol  use: No   Drug use: No    ROS Refer to HPI for ROS details.  Objective:   Vitals: BP (!) 144/82 (BP Location: Right Arm)   Pulse 88   Temp 98.2 F (36.8 C) (Oral)   Resp 16   SpO2 97%   Physical Exam Vitals and nursing note reviewed.  Constitutional:      General: She is not in  acute distress.    Appearance: Normal appearance. She is well-developed. She is not ill-appearing or toxic-appearing.  HENT:     Head: Normocephalic and atraumatic.     Nose: Nasal tenderness, mucosal edema and congestion present. No rhinorrhea.     Right Sinus: Maxillary sinus tenderness present. No frontal sinus tenderness.     Left Sinus: Maxillary sinus tenderness present. No frontal sinus tenderness.     Mouth/Throat:     Mouth: Mucous membranes are moist.     Pharynx: Oropharynx is clear. No posterior oropharyngeal erythema.  Eyes:     General:        Right eye: No discharge.        Left eye: No discharge.     Extraocular Movements: Extraocular movements intact.     Conjunctiva/sclera: Conjunctivae normal.  Cardiovascular:     Rate and Rhythm: Normal rate.  Pulmonary:     Effort: Pulmonary effort is normal. No respiratory distress.  Musculoskeletal:        General: Normal range of motion.  Skin:    General: Skin is warm and dry.  Neurological:     General: No focal deficit present.     Mental Status: She is alert and oriented to person, place, and time.  Psychiatric:        Mood and Affect: Mood normal.        Behavior: Behavior normal.     Procedures  No results found  for this or any previous visit (from the past 24 hours).  No results found.   Assessment and Plan :     Discharge Instructions       1. Nasal sinus congestion (Primary) 2. Postnasal drip - azelastine  (ASTELIN ) 0.1 % nasal spray; Place 1 spray into both nostrils 2 (two) times daily. Use in each nostril as directed  Dispense: 30 mL; Refill: 1 - promethazine -dextromethorphan (PROMETHAZINE -DM) 6.25-15 MG/5ML syrup; Take 5 mLs by mouth 4 (four) times daily as needed for cough.  Dispense: 180 mL; Refill: 0 - azithromycin  (ZITHROMAX  Z-PAK) 250 MG tablet; Take 500 mg on day 1 followed by 250 mg for 4 days.  Take medication for a total of 5 days.  Dispense: 6 tablet; Refill: 0 -Continue to monitor symptoms for any change in severity if there is any escalation of current symptoms or development of new symptoms follow-up in ER for further evaluation and management.      Quavion Boule B Cadee Agro   Timarie Labell, [place], [place] 01/11/24 1012

## 2024-01-11 NOTE — ED Triage Notes (Signed)
 Pt reports headache pressure and  throat drainage x 1 week

## 2024-02-16 ENCOUNTER — Other Ambulatory Visit: Payer: Self-pay

## 2024-02-16 DIAGNOSIS — N644 Mastodynia: Secondary | ICD-10-CM

## 2024-02-16 DIAGNOSIS — Z171 Estrogen receptor negative status [ER-]: Secondary | ICD-10-CM

## 2024-02-17 ENCOUNTER — Inpatient Hospital Stay: Payer: PPO | Attending: Oncology

## 2024-02-17 ENCOUNTER — Other Ambulatory Visit: Payer: Self-pay | Admitting: Oncology

## 2024-02-17 DIAGNOSIS — Z853 Personal history of malignant neoplasm of breast: Secondary | ICD-10-CM | POA: Insufficient documentation

## 2024-02-17 DIAGNOSIS — N644 Mastodynia: Secondary | ICD-10-CM

## 2024-02-17 DIAGNOSIS — Z79899 Other long term (current) drug therapy: Secondary | ICD-10-CM | POA: Diagnosis not present

## 2024-02-17 DIAGNOSIS — E079 Disorder of thyroid, unspecified: Secondary | ICD-10-CM

## 2024-02-17 DIAGNOSIS — Z79810 Long term (current) use of selective estrogen receptor modulators (SERMs): Secondary | ICD-10-CM | POA: Diagnosis not present

## 2024-02-17 DIAGNOSIS — Z171 Estrogen receptor negative status [ER-]: Secondary | ICD-10-CM

## 2024-02-17 DIAGNOSIS — I872 Venous insufficiency (chronic) (peripheral): Secondary | ICD-10-CM | POA: Insufficient documentation

## 2024-02-17 LAB — COMPREHENSIVE METABOLIC PANEL WITH GFR
ALT: 18 U/L (ref 0–44)
AST: 26 U/L (ref 15–41)
Albumin: 4.6 g/dL (ref 3.5–5.0)
Alkaline Phosphatase: 72 U/L (ref 38–126)
Anion gap: 10 (ref 5–15)
BUN: 15 mg/dL (ref 8–23)
CO2: 27 mmol/L (ref 22–32)
Calcium: 9.7 mg/dL (ref 8.9–10.3)
Chloride: 103 mmol/L (ref 98–111)
Creatinine, Ser: 0.76 mg/dL (ref 0.44–1.00)
GFR, Estimated: >60 mL/min (ref >60)
Glucose, Bld: 95 mg/dL (ref 70–99)
Potassium: 4.4 mmol/L (ref 3.5–5.1)
Sodium: 140 mmol/L (ref 135–145)
Total Bilirubin: 0.6 mg/dL (ref 0.0–1.2)
Total Protein: 7.7 g/dL (ref 6.5–8.1)

## 2024-02-17 LAB — CBC WITH DIFFERENTIAL/PLATELET
Abs Immature Granulocytes: 0.01 K/uL (ref 0.00–0.07)
Basophils Absolute: 0 K/uL (ref 0.0–0.1)
Basophils Relative: 1 %
Eosinophils Absolute: 0.4 K/uL (ref 0.0–0.5)
Eosinophils Relative: 8 %
HCT: 46.2 % — ABNORMAL HIGH (ref 36.0–46.0)
Hemoglobin: 15.1 g/dL — ABNORMAL HIGH (ref 12.0–15.0)
Immature Granulocytes: 0 %
Lymphocytes Relative: 50 %
Lymphs Abs: 2.7 K/uL (ref 0.7–4.0)
MCH: 31.7 pg (ref 26.0–34.0)
MCHC: 32.7 g/dL (ref 30.0–36.0)
MCV: 96.9 fL (ref 80.0–100.0)
Monocytes Absolute: 0.4 K/uL (ref 0.1–1.0)
Monocytes Relative: 7 %
Neutro Abs: 1.9 K/uL (ref 1.7–7.7)
Neutrophils Relative %: 34 %
Platelets: 252 K/uL (ref 150–400)
RBC: 4.77 MIL/uL (ref 3.87–5.11)
RDW: 12.5 % (ref 11.5–15.5)
WBC: 5.5 K/uL (ref 4.0–10.5)
nRBC: 0 % (ref 0.0–0.2)

## 2024-02-17 LAB — TSH: TSH: 1.89 u[IU]/mL (ref 0.350–4.500)

## 2024-02-17 LAB — LACTATE DEHYDROGENASE: LDH: 174 U/L (ref 98–192)

## 2024-02-17 LAB — VITAMIN D 25 HYDROXY (VIT D DEFICIENCY, FRACTURES): Vit D, 25-Hydroxy: 40.8 ng/mL (ref 30–100)

## 2024-02-24 ENCOUNTER — Inpatient Hospital Stay: Payer: PPO | Admitting: Oncology

## 2024-02-24 DIAGNOSIS — C50412 Malignant neoplasm of upper-outer quadrant of left female breast: Secondary | ICD-10-CM

## 2024-02-24 MED ORDER — OMEPRAZOLE 20 MG PO CPDR: 20.0000 mg | DELAYED_RELEASE_CAPSULE | Freq: Every day | ORAL | 2 refills | Status: AC

## 2024-02-24 NOTE — Assessment & Plan Note (Addendum)
-   Labs from 02/17/2024 show hemoglobin of 15.1 hematocrit 46.2.  Normal LFTs and platelet counts.  Differential is unremarkable. -She is currently not on antiestrogen therapy due to hormone status.  -Most recent mammogram was from 01/27/2023 which was BI-RADS Category 1.  -She had an ultrasound of her right breast due to tenderness which was also negative.  She is due for a repeat screening mammogram.  I will place the orders today and get this scheduled for her. --Mammogram is scheduled for first week in November 2025. -Return to clinic in 1 year with labs and an office visit.

## 2024-02-24 NOTE — Progress Notes (Signed)
 [name] OFFICE PROGRESS NOTE  Marvine Rush, MD  I connected with [name] on 02/24/24 at 11:30 AM EDT by telephone visit and verified that I am speaking with the correct person using two identifiers.   I discussed the limitations, risks, security and privacy concerns of performing an evaluation and management service by telemedicine and the availability of in-person appointments. I also discussed with the patient that there may be a patient responsible charge related to this service. The patient expressed understanding and agreed to proceed.   Other persons participating in the visit and their role in the encounter: NP, Patient   Patient's location: Home  Provider's location: Clinic    ASSESSMENT & PLAN:  Assessment & Plan Malignant neoplasm of upper-outer quadrant of left female breast, unspecified estrogen receptor status (HCC) - Labs from 02/17/2024 show hemoglobin of 15.1 hematocrit 46.2.  Normal LFTs and platelet counts.  Differential is unremarkable. -She is currently not on antiestrogen therapy due to hormone status.  -Most recent mammogram was from 01/27/2023 which was BI-RADS Category 1.  -She had an ultrasound of her right breast due to tenderness which was also negative.  She is due for a repeat screening mammogram.  I will place the orders today and get this scheduled for her. --Mammogram is scheduled for first week in November 2025. -Return to clinic in 1 year with labs and an office visit.  Orders Placed This Encounter  Procedures   MM 3D SCREENING MAMMOGRAM UNILATERAL RIGHT BREAST    Standing Status:   Future    Expected Date:   03/26/2024    Expiration Date:   02/23/2025    Reason for Exam (SYMPTOM  OR DIAGNOSIS REQUIRED):   Hx of breast cancer    Preferred imaging location?:   [facility]    INTERVAL HISTORY: [name] is a 68 y.o. female here today for follow-up of her left breast cancer.     In the interim, she  was evaluated by vein and vascular for chronic venous insufficiency.  She was started on gabapentin  for numbness in her feet and instructed to wear compression stockings.  She had a mammogram on 01/28/2023 which was negative.  Reports she is scheduled for a repeat mammogram the first week of November.  We reviewed cbc, cmp, vitamin d ,   SUMMARY OF HEMATOLOGIC HISTORY: Oncology History  Breast cancer of upper-outer quadrant of left female breast (HCC)  11/07/1999 Surgery   Lumpectomy/sentinel LN biopsy invasive ductal carcinoma and DCIS at [facility], 0/6 sentinel LN   11/10/1999 - 01/08/2005 Anti-estrogen oral therapy   Tamoxifen 20 mg daily   11/24/1999 - 01/02/2000 Radiation Therapy     12/18/2015 Mammogram   2D digital screening bilateral mammogram with CAD and adjunct TOMO, in the L breast a possible mass warrants further evaluation. In R breast no findings suspicious for malignancy   12/24/2015 Pathology Results   Invasive ductal carcinoma Grade 3 Left core needle biopsy 1:30 o clock ER- PR- Ki67 70%, HER 2 positive   12/24/2015 Imaging   L breast Ultrasound, notes palpable area of concern demonstrates a hypoechoic irregular mass at 1:30, 7 cm from the nipple measuring 1.3 x 0.9 x 1.4 cm. No evidence of L axillary LAD   01/07/2016 Procedure   Left simple mastectomy by Dr. Belinda   01/08/2016 Pathology Results   Breast, simple mastectomy, Left - INVASIVE DUCTAL CARCINOMA, GRADE III/III, SPANNING 1.4 CM. - THE SURGICAL RESECTION MARGINS ARE  NEGATIVE FOR CARCINOMA.   01/23/2016 Genetic Testing   Patient refused genetic testing at time of consultation with Darice Monte.  Despite our recommendation, Ms. Crapps did not wish to pursue genetic testing at today's visit.   01/28/2016 Echocardiogram   Left ventricle: The cavity size was normal. Wall thickness was   normal. Systolic function was normal. The estimated ejection   fraction was in the range of 55% to 60%. Wall motion was  normal;   there were no regional wall motion abnormalities. Doppler   parameters are consistent with abnormal left ventricular   relaxation (grade 1 diastolic dysfunction).   01/31/2016 - 05/15/2016 Chemotherapy   The patient had palonosetron  (ALOXI ) injection 0.25 mg, 0.25 mg, Intravenous,  Once, 1 of 6 cycles  pegfilgrastim  (NEULASTA  ONPRO KIT) injection 6 mg, 6 mg, Subcutaneous, Once, 1 of 6 cycles  CARBOplatin  (PARAPLATIN ) 560 mg in sodium chloride  0.9 % 250 mL chemo infusion, 560 mg (100 % of original dose 558.5 mg), Intravenous,  Once, 1 of 6 cycles Dose modification:   (original dose 558.5 mg, Cycle 1),   (original dose 558.5 mg, Cycle 2)  DOCEtaxel  (TAXOTERE ) 140 mg in dextrose  5 % 250 mL chemo infusion, 75 mg/m2 = 140 mg, Intravenous,  Once, 1 of 6 cycles  for chemotherapy treatment.     01/31/2016 - 01/01/2017 Antibody Plan   Herceptin  x 52 weeks   08/24/2016 Echocardiogram   Left ventricle: The cavity size was normal. Wall thickness was   normal. Systolic function was normal. The estimated ejection   fraction was in the range of 55% to 60%. Wall motion was normal;   there were no regional wall motion abnormalities. Doppler   parameters are consistent with abnormal left ventricular   relaxation (grade 1 diastolic dysfunction).   11/18/2016 Echocardiogram   LV EF: 55% -   60%. - Left ventricle: The cavity size was normal. Wall thickness was   normal. Systolic function was normal. The estimated ejection   fraction was in the range of 55% to 60%. Wall motion was normal;   there were no regional wall motion abnormalities. Left   ventricular diastolic function parameters were normal. - Mitral valve: There was mild regurgitation. - Tricuspid valve: There was mild regurgitation.      CBC    Component Value Date/Time   WBC 5.5 02/17/2024 1049   RBC 4.77 02/17/2024 1049   HGB 15.1 (H) 02/17/2024 1049   HCT 46.2 (H) 02/17/2024 1049   PLT 252 02/17/2024 1049   MCV 96.9  02/17/2024 1049   MCH 31.7 02/17/2024 1049   MCHC 32.7 02/17/2024 1049   RDW 12.5 02/17/2024 1049   LYMPHSABS 2.7 02/17/2024 1049   MONOABS 0.4 02/17/2024 1049   EOSABS 0.4 02/17/2024 1049   BASOSABS 0.0 02/17/2024 1049       Latest Ref Rng & Units 02/17/2024   10:49 AM 02/08/2023    2:47 PM 02/05/2022   11:21 AM  CMP  Glucose 70 - 99 mg/dL 95  881  899   BUN 8 - 23 mg/dL 15  11  11    Creatinine 0.44 - 1.00 mg/dL 9.23  9.28  9.37   Sodium 135 - 145 mmol/L 140  134  139   Potassium 3.5 - 5.1 mmol/L 4.4  3.7  4.4   Chloride 98 - 111 mmol/L 103  102  104   CO2 22 - 32 mmol/L 27  24  26    Calcium 8.9 - 10.3 mg/dL 9.7  9.2  9.7   Total Protein 6.5 - 8.1 g/dL 7.7  7.6  7.6   Total Bilirubin 0.0 - 1.2 mg/dL 0.6  0.6  0.9   Alkaline Phos 38 - 126 U/L 72  65  64   AST 15 - 41 U/L 26  24  33   ALT 0 - 44 U/L 18  22  29       Lab Results  Component Value Date   FERRITIN 103 01/24/2020   VITAMINB12 287 02/05/2022    There were no vitals filed for this visit.  Review of System:  Review of Systems  Constitutional:  Positive for malaise/fatigue.  Musculoskeletal:  Positive for joint pain.  Psychiatric/Behavioral:  The patient is not nervous/anxious and does not have insomnia.     Physical Exam: Physical Exam Neurological:     Mental Status: She is alert and oriented to person, place, and time.      I spent 20 minutes dedicated to the care of this patient (face-to-face and non-face-to-face) on the date of the encounter to include what is described in the assessment and plan.,  Delon Hope, NP 02/24/2024 11:31 AM

## 2024-02-28 ENCOUNTER — Encounter: Payer: Self-pay | Admitting: Oncology

## 2024-03-17 ENCOUNTER — Ambulatory Visit
Admission: RE | Admit: 2024-03-17 | Discharge: 2024-03-17 | Disposition: A | Discharge status: Home / Self Care | Source: Ambulatory Visit | Attending: Oncology | Admitting: Oncology

## 2024-03-17 ENCOUNTER — Ambulatory Visit

## 2024-03-17 DIAGNOSIS — Z1231 Encounter for screening mammogram for malignant neoplasm of breast: Secondary | ICD-10-CM | POA: Diagnosis not present

## 2024-03-17 DIAGNOSIS — C50412 Malignant neoplasm of upper-outer quadrant of left female breast: Secondary | ICD-10-CM

## 2025-02-16 ENCOUNTER — Inpatient Hospital Stay

## 2025-02-23 ENCOUNTER — Inpatient Hospital Stay: Admitting: Oncology
